# Patient Record
Sex: Male | Born: 1960 | Race: White | Hispanic: No | Smoking: Former smoker
Health system: Southern US, Community
[De-identification: ages and names within clinical notes are randomized; demographics above are authoritative.]

## PROBLEM LIST (undated history)

## (undated) DIAGNOSIS — Z8679 Personal history of other diseases of the circulatory system: Secondary | ICD-10-CM

## (undated) DIAGNOSIS — Z9889 Other specified postprocedural states: Secondary | ICD-10-CM

## (undated) DIAGNOSIS — G4730 Sleep apnea, unspecified: Secondary | ICD-10-CM

## (undated) DIAGNOSIS — M2060 Acquired deformities of toe(s), unspecified, unspecified foot: Secondary | ICD-10-CM

## (undated) DIAGNOSIS — Z952 Presence of prosthetic heart valve: Secondary | ICD-10-CM

## (undated) DIAGNOSIS — J189 Pneumonia, unspecified organism: Secondary | ICD-10-CM

## (undated) DIAGNOSIS — D649 Anemia, unspecified: Secondary | ICD-10-CM

## (undated) DIAGNOSIS — I499 Cardiac arrhythmia, unspecified: Secondary | ICD-10-CM

## (undated) DIAGNOSIS — E669 Obesity, unspecified: Secondary | ICD-10-CM

## (undated) DIAGNOSIS — I10 Essential (primary) hypertension: Secondary | ICD-10-CM

## (undated) DIAGNOSIS — K760 Fatty (change of) liver, not elsewhere classified: Secondary | ICD-10-CM

## (undated) DIAGNOSIS — Z98811 Dental restoration status: Secondary | ICD-10-CM

## (undated) DIAGNOSIS — L97509 Non-pressure chronic ulcer of other part of unspecified foot with unspecified severity: Secondary | ICD-10-CM

## (undated) DIAGNOSIS — G629 Polyneuropathy, unspecified: Secondary | ICD-10-CM

## (undated) DIAGNOSIS — E11621 Type 2 diabetes mellitus with foot ulcer: Secondary | ICD-10-CM

## (undated) DIAGNOSIS — M1990 Unspecified osteoarthritis, unspecified site: Secondary | ICD-10-CM

## (undated) DIAGNOSIS — K219 Gastro-esophageal reflux disease without esophagitis: Secondary | ICD-10-CM

## (undated) DIAGNOSIS — I4819 Other persistent atrial fibrillation: Secondary | ICD-10-CM

## (undated) HISTORY — DX: Essential (primary) hypertension: I10

## (undated) HISTORY — PX: OTHER SURGICAL HISTORY: SHX169

## (undated) HISTORY — PX: NASAL SINUS SURGERY: SHX719

## (undated) HISTORY — PX: TONSILLECTOMY AND ADENOIDECTOMY: SUR1326

## (undated) HISTORY — DX: Obesity, unspecified: E66.9

## (undated) HISTORY — PX: KNEE ARTHROSCOPY: SUR90

---

## 1999-04-21 ENCOUNTER — Encounter: Admission: RE | Admit: 1999-04-21 | Discharge: 1999-07-20 | Payer: Self-pay | Admitting: Family Medicine

## 1999-05-26 ENCOUNTER — Encounter: Payer: Self-pay | Admitting: Orthopedic Surgery

## 1999-05-26 ENCOUNTER — Encounter: Admission: RE | Admit: 1999-05-26 | Discharge: 1999-05-26 | Payer: Self-pay | Admitting: Orthopedic Surgery

## 1999-07-15 ENCOUNTER — Ambulatory Visit (HOSPITAL_COMMUNITY): Admission: RE | Admit: 1999-07-15 | Discharge: 1999-07-15 | Payer: Self-pay | Admitting: Gastroenterology

## 1999-07-15 ENCOUNTER — Encounter: Payer: Self-pay | Admitting: Gastroenterology

## 2002-08-09 ENCOUNTER — Encounter: Admission: RE | Admit: 2002-08-09 | Discharge: 2002-11-07 | Payer: Self-pay | Admitting: Family Medicine

## 2003-04-20 DIAGNOSIS — Z8679 Personal history of other diseases of the circulatory system: Secondary | ICD-10-CM

## 2003-04-20 HISTORY — DX: Personal history of other diseases of the circulatory system: Z86.79

## 2003-07-15 ENCOUNTER — Observation Stay (HOSPITAL_COMMUNITY): Admission: RE | Admit: 2003-07-15 | Discharge: 2003-07-15 | Payer: Self-pay | Admitting: Orthopedic Surgery

## 2003-11-18 DIAGNOSIS — Z952 Presence of prosthetic heart valve: Secondary | ICD-10-CM

## 2003-11-18 DIAGNOSIS — Z9889 Other specified postprocedural states: Secondary | ICD-10-CM

## 2003-11-18 HISTORY — DX: Other specified postprocedural states: Z98.890

## 2003-11-18 HISTORY — PX: AORTIC VALVE REPLACEMENT: SHX41

## 2003-11-18 HISTORY — DX: Presence of prosthetic heart valve: Z95.2

## 2004-01-13 ENCOUNTER — Encounter (HOSPITAL_COMMUNITY): Admission: RE | Admit: 2004-01-13 | Discharge: 2004-04-12 | Payer: Self-pay | Admitting: Cardiology

## 2004-03-19 HISTORY — PX: GASTRIC BYPASS: SHX52

## 2004-07-08 ENCOUNTER — Inpatient Hospital Stay (HOSPITAL_COMMUNITY): Admission: EM | Admit: 2004-07-08 | Discharge: 2004-07-09 | Payer: Self-pay | Admitting: Emergency Medicine

## 2004-07-08 ENCOUNTER — Ambulatory Visit: Payer: Self-pay | Admitting: Internal Medicine

## 2004-07-08 DIAGNOSIS — K250 Acute gastric ulcer with hemorrhage: Secondary | ICD-10-CM | POA: Insufficient documentation

## 2004-08-17 ENCOUNTER — Ambulatory Visit: Payer: Self-pay | Admitting: Internal Medicine

## 2004-08-27 ENCOUNTER — Ambulatory Visit: Payer: Self-pay | Admitting: Internal Medicine

## 2005-07-12 ENCOUNTER — Ambulatory Visit (HOSPITAL_COMMUNITY): Admission: RE | Admit: 2005-07-12 | Discharge: 2005-07-12 | Payer: Self-pay | Admitting: Orthopedic Surgery

## 2005-07-12 ENCOUNTER — Encounter (INDEPENDENT_AMBULATORY_CARE_PROVIDER_SITE_OTHER): Payer: Self-pay | Admitting: Specialist

## 2005-07-12 HISTORY — PX: TOE FUSION: SHX1070

## 2005-07-23 ENCOUNTER — Emergency Department (HOSPITAL_COMMUNITY): Admission: EM | Admit: 2005-07-23 | Discharge: 2005-07-23 | Payer: Self-pay | Admitting: Emergency Medicine

## 2005-09-14 ENCOUNTER — Ambulatory Visit: Payer: Self-pay | Admitting: Internal Medicine

## 2006-07-11 ENCOUNTER — Ambulatory Visit: Payer: Self-pay | Admitting: Licensed Clinical Social Worker

## 2006-10-11 ENCOUNTER — Ambulatory Visit: Payer: Self-pay | Admitting: Internal Medicine

## 2007-06-21 ENCOUNTER — Encounter: Admission: RE | Admit: 2007-06-21 | Discharge: 2007-06-21 | Payer: Self-pay | Admitting: Orthopedic Surgery

## 2007-08-09 ENCOUNTER — Ambulatory Visit (HOSPITAL_COMMUNITY): Admission: RE | Admit: 2007-08-09 | Discharge: 2007-08-09 | Payer: Self-pay | Admitting: Orthopedic Surgery

## 2007-08-17 DIAGNOSIS — K2211 Ulcer of esophagus with bleeding: Secondary | ICD-10-CM | POA: Insufficient documentation

## 2007-08-17 DIAGNOSIS — E119 Type 2 diabetes mellitus without complications: Secondary | ICD-10-CM | POA: Insufficient documentation

## 2007-08-17 DIAGNOSIS — K7689 Other specified diseases of liver: Secondary | ICD-10-CM | POA: Insufficient documentation

## 2007-08-17 DIAGNOSIS — K8020 Calculus of gallbladder without cholecystitis without obstruction: Secondary | ICD-10-CM | POA: Insufficient documentation

## 2007-12-26 ENCOUNTER — Encounter: Payer: Self-pay | Admitting: Internal Medicine

## 2008-04-04 ENCOUNTER — Encounter: Payer: Self-pay | Admitting: Internal Medicine

## 2008-07-09 ENCOUNTER — Encounter: Payer: Self-pay | Admitting: Internal Medicine

## 2008-10-02 ENCOUNTER — Telehealth: Payer: Self-pay | Admitting: Internal Medicine

## 2008-12-17 ENCOUNTER — Ambulatory Visit: Payer: Self-pay | Admitting: Licensed Clinical Social Worker

## 2008-12-27 ENCOUNTER — Encounter: Payer: Self-pay | Admitting: Internal Medicine

## 2009-04-07 ENCOUNTER — Encounter: Payer: Self-pay | Admitting: Internal Medicine

## 2009-05-29 ENCOUNTER — Encounter: Admission: RE | Admit: 2009-05-29 | Discharge: 2009-05-29 | Payer: Self-pay | Admitting: Podiatry

## 2009-06-03 ENCOUNTER — Encounter: Admission: RE | Admit: 2009-06-03 | Discharge: 2009-06-03 | Payer: Self-pay | Admitting: Podiatry

## 2009-09-19 ENCOUNTER — Encounter (INDEPENDENT_AMBULATORY_CARE_PROVIDER_SITE_OTHER): Payer: Self-pay | Admitting: *Deleted

## 2009-09-29 ENCOUNTER — Encounter (INDEPENDENT_AMBULATORY_CARE_PROVIDER_SITE_OTHER): Payer: Self-pay | Admitting: *Deleted

## 2009-11-04 ENCOUNTER — Encounter (INDEPENDENT_AMBULATORY_CARE_PROVIDER_SITE_OTHER): Payer: Self-pay | Admitting: *Deleted

## 2009-11-06 ENCOUNTER — Ambulatory Visit: Payer: Self-pay | Admitting: Internal Medicine

## 2009-11-07 ENCOUNTER — Encounter: Payer: Self-pay | Admitting: Internal Medicine

## 2009-11-11 ENCOUNTER — Telehealth: Payer: Self-pay | Admitting: Internal Medicine

## 2009-12-15 ENCOUNTER — Telehealth: Payer: Self-pay | Admitting: Internal Medicine

## 2010-01-16 ENCOUNTER — Encounter: Admission: RE | Admit: 2010-01-16 | Discharge: 2010-01-16 | Payer: Self-pay | Admitting: Orthopedic Surgery

## 2010-02-10 ENCOUNTER — Ambulatory Visit (HOSPITAL_COMMUNITY): Admission: RE | Admit: 2010-02-10 | Discharge: 2010-02-10 | Payer: Self-pay | Admitting: Internal Medicine

## 2010-02-10 ENCOUNTER — Ambulatory Visit: Payer: Self-pay | Admitting: Internal Medicine

## 2010-02-20 ENCOUNTER — Ambulatory Visit: Payer: Self-pay | Admitting: Cardiology

## 2010-03-20 ENCOUNTER — Ambulatory Visit (HOSPITAL_COMMUNITY)
Admission: RE | Admit: 2010-03-20 | Discharge: 2010-03-20 | Payer: Self-pay | Source: Home / Self Care | Admitting: Orthopedic Surgery

## 2010-03-20 HISTORY — PX: METATARSAL OSTEOTOMY: SHX1641

## 2010-05-10 ENCOUNTER — Encounter: Payer: Self-pay | Admitting: Orthopedic Surgery

## 2010-05-21 NOTE — Miscellaneous (Signed)
Summary: question for md  ---- Converted from flag ---- ---- 11/07/2009 8:54 AM, Hilarie Fredrickson MD wrote: Per most recent guidelines, not neccessary. Thanks for checking  ---- 11/06/2009 3:07 PM, Karl Bales RN wrote: Dr. Marina Goodell, This pt is having a colonoscopy at [facility] on 11-14-09.  He has an aortic valve replacement and take Clindamycin prior to procedures.  Do you want him to have this antibiotic before colonoscopy?  Thank you. Karl Bales ------------------------------

## 2010-05-21 NOTE — Miscellaneous (Signed)
Summary: [facility] previsit  Clinical Lists Changes  Medications: Added new medication of MOVIPREP 100 GM  SOLR (PEG-KCL-NACL-NASULF-NA ASC-C) As per prep instructions. - Signed Rx of MOVIPREP 100 GM  SOLR (PEG-KCL-NACL-NASULF-NA ASC-C) As per prep instructions.;  #1 x 0;  Signed;  Entered by: Karl Bales RN;  Authorized by: Hilarie Fredrickson MD;  Method used: [facility] [phone]*, [address], [place], [place], [place]  [address], Ph: [phone], Fax: [phone] Allergies: Added new allergy or adverse reaction of SULFA Added new allergy or adverse reaction of NSAIDS Observations: Added new observation of NKA: F (11/06/2009 14:16)    Prescriptions: MOVIPREP 100 GM  SOLR (PEG-KCL-NACL-NASULF-NA ASC-C) As per prep instructions.  #1 x 0   Entered by:   Karl Bales RN   Authorized by:   Hilarie Fredrickson MD   Signed by:   Karl Bales RN on 11/06/2009   Method used:   Electronically to        [facility] [phone]* (retail)       [address]       [place], [place]  [address]       Ph: [phone]       Fax: [phone]   RxID:   [id number]

## 2010-05-21 NOTE — Progress Notes (Signed)
Summary: resch proc   Phone Note Call from Patient Call back at Work Phone [phone]   Caller: Patient Call For: Dr Marina Goodell Reason for Call: Talk to Nurse Summary of Call: Patient wants to reschedule colon that has to be done in the hosp. Initial call taken by: Tawni Levy,  December 15, 2009 8:58 AM  Follow-up for Phone Call        appt rescheduled for 02/10/10 915 am at [facility] pt aware.   Follow-up by: Chales Abrahams CMA Duncan Dull),  December 15, 2009 9:54 AM

## 2010-05-21 NOTE — Miscellaneous (Signed)
Summary: Orders Update-Colonoscopy [facility]  Clinical Lists Changes  Orders: Added new Test order of ZCOL (ZCOL) - Signed

## 2010-05-21 NOTE — Letter (Signed)
Summary: Colonoscopy Letter   Gastroenterology  [address] [place], [place] [address]   Phone: [phone]  Fax: [phone]      September 19, 2009 MRN: [id number]   [facility] [address] [place], [place]  [address]   Dear Mr. Philip [name],   According to your medical record, it is time for you to schedule a Colonoscopy. The American Cancer Society recommends this procedure as a method to detect early colon cancer. Patients with a family history of colon cancer, or a personal history of colon polyps or inflammatory bowel disease are at increased risk.  This letter has been generated based on the recommendations made at the time of your procedure. If you feel that in your particular situation this may no longer apply, please contact our office.  Please call our office at [phone] to schedule this appointment or to update your records at your earliest convenience.  Thank you for cooperating with [place] to provide you with the very best care possible.   Sincerely,  Wilhemina Bonito. Marina Goodell, M.D.  [facility] Gastroenterology Division [phone]

## 2010-05-21 NOTE — Letter (Signed)
Summary: [facility] Instructions  [facility]  [address] [place], [place] [address]   Phone: [phone]  Fax: [phone]       Philip [name]    [birth year]    MRN: [id number]        Procedure Day Dorna Bloom: Friday, 50-29-11     Arrival Time: 50:00 a.m.     Procedure Time: 9:00 a.m.     Location of Procedure:                    x [facility] (outpatient registration)  PREPARATION FOR COLONOSCOPY WITH MOVIPREP   Starting 5 days prior to your procedure 11-09-09 do not eat nuts, seeds, popcorn, corn, beans, peas,  salads, or any raw vegetables.  Do not take any fiber supplements (e.g. Metamucil, Citrucel, and Benefiber).  THE DAY BEFORE YOUR PROCEDURE         DATE:  11-13-09 DAY: Thursday  50.  Drink clear liquids the entire day-NO SOLID FOOD  2.  Do not drink anything colored red or purple.  Avoid juices with pulp.  No orange juice.  3.  Drink at least 64 oz. (8 glasses) of fluid/clear liquids during the day to prevent dehydration and help the prep work efficiently.  CLEAR LIQUIDS INCLUDE: Water Jello Ice Popsicles Tea (sugar ok, no milk/cream) Powdered fruit flavored drinks Coffee (sugar ok, no milk/cream) Gatorade Juice: apple, white grape, white cranberry  Lemonade Clear bullion, consomm, broth Carbonated beverages (any kind) Strained chicken noodle soup Hard Candy                             4.  In the morning, mix first dose of MoviPrep solution:    Empty 1 Pouch A and 1 Pouch B into the disposable container    Add lukewarm drinking water to the top line of the container. Mix to dissolve    Refrigerate (mixed solution should be used within 24 hrs)  5.  Begin drinking the prep at 5:00 p.m. The MoviPrep container is divided by 4 marks.   Every 15 minutes drink the solution down to the next mark (approximately 8 oz) until the full liter is complete.   6.  Follow completed prep with 16 oz of clear liquid of your choice (Nothing red or purple).   Continue to drink clear liquids until bedtime.  7.  Before going to bed, mix second dose of MoviPrep solution:    Empty 1 Pouch A and 1 Pouch B into the disposable container    Add lukewarm drinking water to the top line of the container. Mix to dissolve    Refrigerate  THE DAY OF YOUR PROCEDURE      DATE:  11-14-09 DAY: FRIDAY  Beginning at  4:00 a.m. (5 hours before procedure):         1. Every 15 minutes, drink the solution down to the next mark (approx 8 oz) until the full liter is complete.  2. Follow completed prep with 16 oz. of clear liquid of your choice.    3. You may drink clear liquids until 5:00 a.m. (4 hours prior to procedure)   MEDICATION INSTRUCTIONS  Unless otherwise instructed, you should take regular prescription medications with a small sip of water   as early as possible the morning of your procedure.  Diabetic patients - see separate instructions.  HOLD FUROSEMIDE before coming the day of your procedure only (only  hold 11-14-09 before coming in)         OTHER INSTRUCTIONS  You will need a responsible adult at least 50 years of age to accompany you and drive you home.   This person must remain in the waiting room during your procedure.  Wear loose fitting clothing that is easily removed.  Leave jewelry and other valuables at home.  However, you may wish to bring a book to read or  an iPod/MP3 player to listen to music as you wait for your procedure to start.  Remove all body piercing jewelry and leave at home.  Total time from sign-in until discharge is approximately 2-3 hours.  You should go home directly after your procedure and rest.  You can resume normal activities the  day after your procedure.  The day of your procedure you should not:   Drive   Make legal decisions   Operate machinery   Drink alcohol   Return to work  You will receive specific instructions about eating, activities and medications before you leave.    The  above instructions have been reviewed and explained to me by  Karl Bales RN  November 06, 2009 2:58 PM    I fully understand and can verbalize these instructions _____________________________ Date _________

## 2010-05-21 NOTE — Procedures (Signed)
Summary: Colonoscopy  Patient: Philip [name] Note: All result statuses are Final unless otherwise noted.  Tests: (1) Colonoscopy (COL)   COL Colonoscopy           DONE     [facility]     [address] [place], [place]  [address]           COLONOSCOPY PROCEDURE REPORT           PATIENT:  Philip [name], Philip [name]  MR#:  [id number]     BIRTHDATE:  1960/06/24, 48 yrs. old  GENDER:  male     ENDOSCOPIST:  Wilhemina Bonito. Eda Keys, MD     REF. BY:  Screening Recall     PROCEDURE DATE:  02/10/2010     PROCEDURE:  Higher-risk screening colonoscopy G0105           ASA CLASS:  Class II     INDICATIONS:  family history of colon cancer, surveillance and     high-risk screening ; grandparent w/ CRC; negative exam 2006     MEDICATIONS:   Fentanyl 100 mcg IV, Versed 10 mg IV           DESCRIPTION OF PROCEDURE:   After the risks benefits and     alternatives of the procedure were thoroughly explained, informed     consent was obtained.  Digital rectal exam was performed and     revealed no abnormalities.   The Pentax Ped Colon [id number]     endoscope was introduced through the anus and advanced to the     cecum, which was identified by both the appendix and ileocecal     valve, without limitations.Time to cecum = 3:00 min.  The quality     of the prep was good, using MoviPrep.  The instrument was then     slowly withdrawn (time = 10:30 min) as the colon was fully     examined.     <<PROCEDUREIMAGES>>           FINDINGS:  A normal appearing cecum, ileocecal valve, and     appendiceal orifice were identified. The ascending, hepatic     flexure, transverse, splenic flexure, descending, sigmoid colon,     and rectum appeared unremarkable.  No polyps or cancers were seen.     Retroflexed views in the rectum revealed internal hemorrhoids.     The scope was then withdrawn from the patient and the procedure     completed.           COMPLICATIONS:  None     ENDOSCOPIC IMPRESSION:     1) Normal colon     2)  No polyps or cancers     3) Internal hemorrhoids     RECOMMENDATIONS:     1) Continue current colorectal screening recommendations for     "routine risk" patients with a repeat colonoscopy in 10 years.           ______________________________     Wilhemina Bonito. Eda Keys, MD           CC:  The Patient           n.     eSIGNED:   Wilhemina Bonito. Eda Keys at 02/10/2010 10:21 AM           Renie Ora, [phone]  Note: An exclamation mark (!) indicates a result that was not dispersed into the flowsheet. Document Creation Date: 02/10/2010 10:22 AM _______________________________________________________________________  (1)  Order result status: Final Collection or observation date-time: 02/10/2010 10:15 Requested date-time:  Receipt date-time:  Reported date-time:  Referring Physician:   Ordering Physician: Fransico Setters [phone]) Specimen Source:  Source: Launa Grill Order Number: [id number] Lab site:

## 2010-05-21 NOTE — Letter (Signed)
Summary: Diabetic Instructions  [facility]  [address] [place], [place] [address]   Phone: [phone]  Fax: [phone]    Philip [name] [birth year] MRN: [id number]   _x _   ORAL DIABETIC MEDICATION INSTRUCTIONS  The day before your procedure:   Take your diabetic pill as you do normally  The day of your procedure:   Do not take your diabetic pill    We will check your blood sugar levels during the admission process and again in Recovery before discharging you home  ________________________________________________________________________

## 2010-05-21 NOTE — Progress Notes (Signed)
Summary: Reschedule COL   Phone Note Call from Patient Call back at Work Phone [phone] Call back at [phone]   Caller: Patient Call For: Dr. Marina Goodell Reason for Call: Talk to Nurse Summary of Call: Needs to r/s his COL on 11-14-09 Initial call taken by: Karna Christmas,  November 11, 2009 9:21 AM  Follow-up for Phone Call         Annice Pih ntfd to  cx procedure. for Friday at [facility].Pt will call back to r/s. Follow-up by: Teryl Lucy RN,  November 11, 2009 10:03 AM  Additional Follow-up for Phone Call Additional follow up Details #1::        ok thanks. When he does rescedule, it needs to be at hospital (due to size). Additional Follow-up by: Hilarie Fredrickson MD,  November 11, 2009 10:13 AM

## 2010-05-21 NOTE — Letter (Signed)
Summary: Previsit letter  [facility]  [address] [place], [place] [address]   Phone: [phone]  Fax: [phone]       09/29/2009 MRN: [id number]  [facility] [address] [place], [place]  [address]  Dear Mr. Philip [name],  Welcome to the Gastroenterology Division at [facility].    You are scheduled to see a nurse for your pre-procedure visit on 11-06-09 at 2:30p.m. on the 3rd floor at [facility], [address]  We ask that you try to arrive at our office 15 minutes prior to your appointment time to allow for check-in.  Your nurse visit will consist of discussing your medical and surgical history, your immediate family medical history, and your medications.    Please bring a complete list of all your medications or, if you prefer, bring the medication bottles and we will list them.  We will need to be aware of both prescribed and over the counter drugs.  We will need to know exact dosage information as well.  If you are on blood thinners (Coumadin, Plavix, Aggrenox, Ticlid, etc.) please call our office today/prior to your appointment, as we need to consult with your physician about holding your medication.   Please be prepared to read and sign documents such as consent forms, a financial agreement, and acknowledgement forms.  If necessary, and with your consent, a friend or relative is welcome to sit-in on the nurse visit with you.  Please bring your insurance card so that we may make a copy of it.  If your insurance requires a referral to see a specialist, please bring your referral form from your primary care physician.  No co-pay is required for this nurse visit.     If you cannot keep your appointment, please call [phone] to cancel or reschedule prior to your appointment date.  This allows [place] the opportunity to schedule an appointment for another patient in need of care.    Thank you for choosing [facility] for your medical needs.  We  appreciate the opportunity to care for you.  Please visit [place] at our website  to learn more about our practice.                     Sincerely.                                                                                                                   The Gastroenterology Division

## 2010-06-29 LAB — BASIC METABOLIC PANEL
BUN: 17 mg/dL (ref 6–23)
CO2: 30 mEq/L (ref 19–32)
Potassium: 4.3 mEq/L (ref 3.5–5.1)
Sodium: 138 mEq/L (ref 135–145)

## 2010-06-29 LAB — GLUCOSE, CAPILLARY
Glucose-Capillary: 128 mg/dL — ABNORMAL HIGH (ref 70–99)
Glucose-Capillary: 134 mg/dL — ABNORMAL HIGH (ref 70–99)

## 2010-06-29 LAB — CBC
Hemoglobin: 14 g/dL (ref 13.0–17.0)
MCHC: 33.2 g/dL (ref 30.0–36.0)
RBC: 4.64 MIL/uL (ref 4.22–5.81)
RDW: 14.2 % (ref 11.5–15.5)

## 2010-07-01 LAB — GLUCOSE, CAPILLARY: Glucose-Capillary: 124 mg/dL — ABNORMAL HIGH (ref 70–99)

## 2010-09-01 NOTE — Assessment & Plan Note (Signed)
[facility]                         GASTROENTEROLOGY OFFICE NOTE   ANANIAS, KOLANDER                          MRN:          [id number]  DATE:10/11/2006                            DOB:          Jan 17, 1961    HISTORY:  Mr. Philip [name] presents today for followup and requests medication  refill.  He is a 50 year old gentleman with morbid obesity for which he  is status post gastric bypass surgery, history of aortic valve  replacement (porcine valve), history of right sided heart failure,  diabetes mellitus, upper GI bleed secondary to postoperative anastomotic  ulcer requiring endoscopic hemostatic therapy, family history of colon  cancer, incidental cholelithiasis, and nonalcoholic steatohepatitis.  He  was last evaluated Sep 14, 2005 regarding elevated liver tests and an  abnormal ultrasound.  See that dictation for details.  Since that time  he has been doing well.  No evidence of recurrent GI bleeding.  He takes  Nexium 40 mg daily chronically as requested.  He takes 1 baby aspirin  for his heart.  He has had no problems with abdominal pain to suggest  symptomatic gallstones.   ALLERGIES:  PENICILLIN and NON-STEROIDAL ANTI-INFLAMMATORY DRUGS.   CURRENT MEDICATIONS:  1. Actos 45 mg daily.  2. Metformin 1000 mg b.i.d.  3. Zyrtec once daily.  4. Aspirin 81 mg daily.  5. Vitamin C.  6. Multivitamin.  7. Potassium 8 mEq daily.  8. Altace 2.5 mg b.i.d.  9. Nexium 40 mg daily.  10.Lasix 40 mg b.i.d.  11.Calcium.  12.Lexapro 20 mg daily.   PHYSICAL EXAMINATION:  A well-appearing male in no acute distress.  Blood pressure is 118/86, heart rate is 68 and regular.  Weight is 370  pounds.  HEENT:  Sclerae are anicteric. Conjunctiva are pink.  Oral mucosa is  intact.  No adenopathy.  LUNGS:  Clear.  HEART:  Regular.  ABDOMEN:  Obese and soft without tenderness, mass or hernia.  No  organomegaly, good bowel sounds heard.  Priorsurgical incision is well  healed.   IMPRESSION:  1. History of bleeding gastric ulcer at the postop anastomosis      requiring endoscopic hemostatic therapy.  Currently on chronic      Nexium 40 mg daily to prevent recurrent bleeding ulcer.  This is      also quite important given his need for chronic aspirin therapy.  2. Non-alcoholic fatty liver disease .  Stable.  3. Cholelithiasis.  Incidental.  4. Family history of colon cancer.  Negative screening colonoscopy May      2006.  Followup in 2011 as planned.   RECOMMENDATIONS:  1. Refill Nexium as requested.  2. Resume general medical care with Dr. Riley Nearing  3. Routine gastrointestinal followup in 1 year unless interval      questions or problems.     Wilhemina Bonito. Marina Goodell, MD  Electronically Signed    JNP/MedQ  DD: 10/11/2006  DT: 10/11/2006  Job #: [id number]   cc:   Reginia Forts, MD

## 2010-09-01 NOTE — Op Note (Signed)
NAME:  Philip [name], Philip [name]                   ACCOUNT NO.:  [id number]   MEDICAL RECORD NO.:  [id number]          PATIENT TYPE:  AMB   LOCATION:  SDS                          FACILITY:  [facility]   PHYSICIAN:  John L. Rendall, M.D.  DATE OF BIRTH:  [birth year]   DATE OF PROCEDURE:  08/09/2007  DATE OF DISCHARGE:                               OPERATIVE REPORT   INDICATION AND JUSTIFICATION FOR PROCEDURE:  Chronic medial left knee  pain with MRI positive for osteoarthritis plus posterior horn tear of  medial meniscus at the root, justification for outpatient setting,  inpatient not required.   PREOPERATIVE DIAGNOSIS:  Posterior horn tear of medial meniscus.   SURGEON:  John L. Rendall, M.D.   ANESTHESIA:  MAC.   PROCEDURE:  Under MAC anesthesia, the knee was prepared with DuraPrep  and draped as a sterile field with a leg holder.  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  Local chondroplasty was done on peeling hyaline cartilage.  In the posterior horn, there is a tear at the root of the meniscus.  This was resected back to stable meniscal rim with a combination of  basket forceps and intraarticular shaver.  The lateral meniscus showed  some minor abnormal signal on MRI, but it was intact showing no signs of  tearing and there is no significant arthritis in this compartment.  The  center of the femoral trochlea showed no significant arthritis as did  the undersurface of the patella.  The knee was copiously irrigated with  saline, infiltrated with Marcaine with epinephrine, and a sterile  compression bandage was applied.  Two of the three punctures were  sutured.  The patient was returned to recovery in good condition.      John L. Rendall, M.D.  Electronically Signed     JLR/MEDQ  D:  08/09/2007  T:  08/09/2007  Job:  [id number]

## 2010-09-04 NOTE — Op Note (Signed)
NAME:  MCCABE, GLORIA                   ACCOUNT NO.:  [id number]   MEDICAL RECORD NO.:  [id number]          PATIENT TYPE:  AMB   LOCATION:  DAY                          FACILITY:  [facility]   PHYSICIAN:  John L. Rendall, M.D.  DATE OF BIRTH:  [birth year]   DATE OF PROCEDURE:  07/12/2005  DATE OF DISCHARGE:                                 OPERATIVE REPORT   PREOPERATIVE DIAGNOSIS:  Fixed claw toe, left third.   SURGICAL PROCEDURE:  PIP and the DIP fusion, left third toe.   POSTOPERATIVE DIAGNOSIS:  Fixed claw toe, left third.   SURGEON:  John L. Rendall, M.D.   ANESTHESIA:  Ankle and metatarsal block with sedation.   PROCEDURE:  Under the above anesthesia the left foot is prepared with  DuraPrep and draped as a sterile field.  It is wrapped out with an Esmarch  and ankle Esmarch is used as a tourniquet.  A dorsal midline incision is  made down the center of the toe.  The extensor mechanism is elevated from  the 2 joints.  The bone ends are exposed and removed with a rongeur, leaving  a flat square surface on all 4 bone ends.  Once the 4 bone ends are exposed  and cut, the Accutrack screw set is used; standard bone screw and a  guidewire is placed from the distal phalanx beneath the nail, up the distal  phalanx, coming out in the center of the DIP joint crossing the middle  phalanx and into the center of the proximal phalanx, crossing the MP joint  the length of this is measured with the bone ends impacted; and is  approximately 35-40 mm for the entire intratoe screw length.   Consequently, a standard 30 mm Accutrack 2 bone screw is chosen.  The distal  cortex of the distal phalanx is perforated with a drill and then the self-  threading screw is placed up the guidewire after checking first with the C-  arm to make sure of good position and alignment.  The screw is then passed  and it self compressed the 2 joints leaving complete bone apposition of the  DIP joint; and only a tiny gap in a  portion of the PIP joint.  This is then  filled in with cancellus bone chips from the resected bone ends.  Once this  is completed, the extensor mechanism is closed over the osteotomies with  side-to-side sutures of 2-0 Vicryl.  The tourniquet is let down at 30  minutes at the ankle, slight hyperextension of the MP joint is corrected by  a Z-lengthening of the extensor at the MP joint.  This is then repaired with  the 2-0 Vicryl stitch and the problem of hyperextension is no longer  encountered.  With the tourniquet down, there are several veins that are  cauterized with the fascial envelope closed, skin sutures of 3-0 nylon are  used approximately four of them, about a centimeter apart; and once they are  in a sterile compression bandage is applied with a wooden shoe in recovery  room.  The  patient is given Percocet and Cipro and is to recheck in the  office in approximately 3 days.  He is here released home, once he has  awakened.      John L. Rendall, M.D.  Electronically Signed    JLR/MEDQ  D:  07/12/2005  T:  07/14/2005  Job:  [id number]

## 2010-09-04 NOTE — Discharge Summary (Signed)
Philip [name], ROUT NO.:  [id number]   MEDICAL RECORD NO.:  [id number]          PATIENT TYPE:  INP   LOCATION:  4709                         FACILITY:  [facility]   PHYSICIAN:  Danae Chen, M.D.DATE OF BIRTH:  06/25/1960   DATE OF ADMISSION:  07/08/2004  DATE OF DISCHARGE:  07/09/2004                                 DISCHARGE SUMMARY   PRIMARY PHYSICIAN:  Dr. Riley Nearing in [facility].   GI CONSULTANT:  John N. Marina Goodell, M.D. [facility].   DISCHARGE DIAGNOSES:  1.  Upper gastrointestinal bleed.  2.  Gastric ulcer.  3.  Status post gastric bypass surgery.  4.  Diabetes.  5.  Status post aortic valve replacement, porcine valve.   DISCHARGE MEDICATIONS:  1.  The patient is to resume his home medications at prior doses, except for      his aspirin.  2.  He is to take Actos 30 mg p.o. daily.  3.  Metformin extended release 1,00 mg p.o. b.i.d.  4.  Lasix 80 mg p.o. q.a.m. 40 mg p.o. at bedtime.  5.  Actigall 300 mg p.o. b.i.d.  6.  Potassium supplements 80 mEq p.o. daily.  7.  Vitamin C 1,000 mg daily.  8.  Calcium 500 mg p.o. b.i.d.  9.  Centrum 1 multivitamin daily.  10. New medication, Protonix 40 mg p.o. b.i.d. for two weeks, then to resume      once daily Nexium 40 mg p.o. daily.   FOLLOWUP:  Appointment has been made with Dr. Marina Goodell for Aug 17, 2004 at 11:00  a.m. on a Monday.   DISPOSITION:  As noted, the patient is to not resume his aspirin until he is  seen by Dr. Marina Goodell.   HOSPITAL COURSE:  The patient is a pleasant 50 year old gentleman who was  admitted for evaluation of black, tarry stools.  He underwent gastric bypass  surgery in December 2005 as well as an aortic valve replacement in August  2005.  The patient had not been on any nonsteroidals, no alcohol, but had  been on a baby aspirin since his surgery.  The patient was found to be  anemic on admission, with hemoglobin 11.8, so GI consult was obtained.  The  patient was prepped for an EGD,  which he underwent on July 08, 2004.  Please see the full report for details, but essentially the patient had a  stomach ulcer at the site of anastomosis from his bypass surgery which was  oozing but did not have any active bleeding.  It was injected with  epinephrine, and hemostasis was achieved.  The patient tolerated the  procedure well.  At the time of discharge, discharge hemoglobin was 10.4.  The patient was afebrile.  Vital signs were stable.  Blood pressure was 109/71.  Abdomen was soft.  He  was tolerating his liquid diet, and no nausea, no abdominal pain, no  vomiting.  The patient is to follow up with his primary care physician, Dr.  Riley Nearing, in one to two weeks, and with Dr. Marina Goodell, as noted above.  RLK/MEDQ  D:  07/09/2004  T:  07/10/2004  Job:  [id number]

## 2010-09-04 NOTE — Op Note (Signed)
NAMERJ, PEDROSA                             ACCOUNT NO.:  [id number]   MEDICAL RECORD NO.:  [id number]                   PATIENT TYPE:  INP   LOCATION:  2899                                 FACILITY:  [facility]   PHYSICIAN:  John L. Rendall III, M.D.           DATE OF BIRTH:  [birth year]   DATE OF PROCEDURE:  07/15/2003  DATE OF DISCHARGE:                                 OPERATIVE REPORT   PREOPERATIVE DIAGNOSIS:  Torn right medial meniscus with osteoarthritis.   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   OPERATION PERFORMED:  Medial meniscectomy with local chondroplasty plus  patellofemoral chondroplasty.   SURGEON:  John L. Rendall, M.D.   ANESTHESIA:  Local with sedation.   DESCRIPTION OF PROCEDURE:  Under local anesthesia with sedation, the right  knee was prepared with Betadine and draped as a sterile field with a leg  holder.  Standard arthroscopic portals were made and some peeling hyaline  articular cartilage was found in the center of the weightbearing medial  femoral condyle and a complex posterior horn tear of the medial meniscus was  found.  Using a Cuda shaver, a local chondroplasty was done on the condyle  and using a combination of basket forceps, the posterior horn of the medial  meniscus was resected back to stable meniscal rim.  The remainder of the  knee was then carefully examined, the lateral compartment was intact.  The  center of the femoral trochlea beneath the patella showed a stellate pattern  of peeling cartilage and the inferior pole of the patella that impacts this  area is also breaking down significantly.  Using a shaver through both  medial and lateral portals, these areas were shaved, removing all peeling  hyaline cartilage.  Once this completed, the knee was copiously irrigated  with saline.  Punctures were left open.  The knee was infiltrated with  Marcaine with morphine with epinephrine and a sterile  compressive bandage  was applied.  A determination as to whether the patient goes home or not  depends on how he awakens due to his sleep apnea problem.  We will give him  Vicodin for pain.                                               John L. Dorothyann Gibbs, M.D.    Renato Gails  D:  07/15/2003  T:  07/15/2003  Job:  [id number]

## 2010-09-04 NOTE — H&P (Signed)
NAMECARLYN, MULLENBACH NO.:  [id number]   MEDICAL RECORD NO.:  [id number]          PATIENT TYPE:  INP   LOCATION:  1832                         FACILITY:  [facility]   PHYSICIAN:  Elliot Cousin, M.D.    DATE OF BIRTH:  09-29-1960   DATE OF ADMISSION:  07/08/2004  DATE OF DISCHARGE:                                HISTORY & PHYSICAL   PRIMARY CARE PHYSICIAN:  Dr. Riley Nearing in [place], [place].   GASTROENTEROLOGIST:  Ulyess Mort, M.D. [facility].   CHIEF COMPLAINT:  Nausea and black stools.   HISTORY OF PRESENT ILLNESS:  Mr. Mcconaghy is a 50 year old man with a past  medical history significant for gastric bypass surgery in December 2005,  status post aortic valve replacement in August 2005, diabetes mellitus, and  hypertension, who presents to the emergency department with several episodes  of black watery stools today.  The patient had his first black watery stool  at approximately 3:30 p.m. on July 07, 2004.  He states that he may have  seen a little bright red blood in the stool.  The patient denies abdominal  pain, but he has had some nausea.  He denies vomiting or hematemesis.  He  has had some associated light-headedness for the past two days, and he has  also complained of fatigue for the past few days.  The patient has had no  spinning dizziness and no blackout spells.  He denied any associated chest  pain or shortness of breath.  He does not drink alcohol.  He does take a  baby aspirin 81 mg daily.  The patient denies any other NSAIDs.  The patient  has no prior history of anemia.  He did have a colonoscopy performed by Dr.  Corinda Gubler in 2002 which was completely within normal limits.  The patient does  have a family history of colon cancer.   When the patient was evaluated in the emergency department, his blood  pressure was 106/62.  His hemoglobin was 11.8, with an MCV of 83.3.  Dr. Linwood Dibbles inserted an NG tube to evaluate for an upper GI source of  bleeding.  The NG actually suctioned out bloody contents from the patient's stomach.  The patient will therefore be admitted for further evaluation and  management.   PAST MEDICAL HISTORY:  1.  Status post laparoscopic gastric bypass surgery.  The patient has lost      100 pounds in 90 days.  Surgery performed by Dr. Lily Peer at [facility], [facility], December 2005.  2.  Status post aortic valve replacement at [facility], [facility], August 2005.  Operation performed by Dr. Park Breed.  3.  Perioperative right-sided heart failure.  4.  Endocarditis in 1994.  5.  Type 2 diabetes mellitus.  6.  Hypertension.  7.  Obstructive sleep apnea.  8.  Obesity.  9.  Status post tonsillectomy and adenoidectomy in the past, along with      status post nasal  polyp excision in the past.  10. Status post arthroscopic surgery on both knees in the past.  The latest      surgery performed by Dr. Priscille Kluver was a medial meniscectomy with local      chondroplasty plus patellofemoral chondroplasty.  11. Status post arthroscopic surgery on both feet, with pins in both second      toes.  12. Depression.  13. Seasonal allergies.   MEDICATIONS:  1.  CPAP at bedtime.  2.  Actos 30 mg daily.  3.  Metformin ER 1,000 mg b.i.d.  4.  Lasix 80 mg in the morning and 40 mg in the evening.  5.  Actigall 300 mg b.i.d.  6.  Aspirin 81 mg daily.  7.  Potassium chloride 8 mEq daily.  8.  Vitamin C 1,000 mg daily.  9.  Calcium 500 mg b.i.d.  10. Centrum 1 tablet daily.   ALLERGIES:  1.  The patient has an allergy to PENICILLIN, which causes his lips to      swell.  2.  The patient does have an intolerance to NSAIDs, although he can tolerate      baby aspirin.   SOCIAL HISTORY:  The patient is married and lives [place]  He has four children.  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  He  denies tobacco, alcohol, and  drug use.  He can read and write.   FAMILY HISTORY:  Positive for colon cancer.  His mother is 62 years of age  and has arthritis and Alzheimer's.  His father is 61 years of age and has no  significant past medical history.   REVIEW OF SYSTEMS:  The patient's review of systems is positive for  occasional swelling in his legs, occasional bilateral knee pain, occasional  shortness of breath.  Otherwise, his review of systems is negative.   EXAMINATION:  TEMPERATURE:  97.6.  BLOOD PRESSURE:  106/62.  PULSE:  88.  RESPIRATORY RATE:  16.  OXYGEN SATURATION:  97% on room air.  GENERAL:  The patient is a pleasant obese 50 year old Caucasian man who is  currently lying in bed, in no acute distress.  HEENT:  Head is normocephalic, atraumatic.  Pupils are equal, round, and  reactive to light.  Extraocular movements are intact.  Conjunctivae are  clear.  Sclerae are white.  Tympanic membrane are clear bilaterally.  Nasal  mucosa is moist.  No sinus tenderness.  Oropharynx reveals mildly dry mucous  membranes.  No posterior exudates or erythema.  His teeth are in good  repair.  NECK:  Supple, obese.  No thyromegaly, no bruit, no JVD.  LUNGS:  Decreased breath sounds in the bases.  Otherwise clear to  auscultation bilaterally.  HEART:  S1, S2, with a soft systolic murmur.  CHEST WALL:  There is a well-healed sternotomy scar.  ABDOMEN:  There are well-healed small scars throughout the abdomen, all well  healed.  His abdomen is obese.  Positive bowel sounds.  Soft, nontender,  nondistended.  No hepatosplenomegaly.  The NG tube has drained approximately  250 cc of maroon-colored gastric contents.  RECTAL:  As performed by Dr. Lynelle Doctor, the stool was black and guaiac positive.  EXTREMITIES:  The patient has well-healed small scars on both knees.  Pedal  pulses are 2+ bilaterally.  No pretibial edema, no pedal edema.  The patient has a fairly good range of motion of all  of his  extremities.  NEUROLOGIC:  The patient is alert and oriented x 3.  Cranial nerves II-XII  are intact.  Strength is 5/5 throughout.  Sensation is intact.  Gait was not  assessed.   ADMISSION LABORATORIES:  EKG revealed normal sinus rhythm, with a heart rate  of 83 beats per minute.  Left axis deviation.  Nonspecific T wave  abnormalities.  Prolonged QT of 396.  Hemoglobin 11.9, hematocrit 34.7, MCV  83.3, WBC 12.5.  PT 15.1, INR 1.3, PTT 31.  Sodium 139, potassium 4.1,  chloride 100, CO2 33, glucose 143, BUN 37, creatinine 1, calcium 8.8, total  protein 6.6, albumin 3.5, AST 20, ALT 16, alkaline phosphatase 75, total  bilirubin 1.3.   ASSESSMENT:  1.  Melena/GI Bleed.  The patient also has evidence of a bloody gastric      lavage per NG.  The patient had no  prior complaints of hematemesis or      coffee-ground emesis.  He is currently hemodynamically stable, although      his blood pressure is low normal.  His hemoglobin is mildly low at 11.8.      However, with volume repletion, will expect the hemoglobin to decrease      even further.  The patient denies alcohol use.  However, he does take a      baby aspirin per day.  Will consider PUD secondary to aspirin therapy.      Will also consider a lower GI bleed secondary to hemorrhoids versus      polyps, versus other.  The patient's colonoscopy in 2002 was essentially      negative.  2.  Mild leukocytosis.  The patient has no signs or symptoms consistent with      infection.  The leukocytosis could be reactive.  3.  Azotemia.  The patient's BUN is elevated at 37, and the creatinine is      within normal limits at 1.  The elevated BUN is probably secondary to GI      bleed.  4.  Normocytic anemia.  As above.  5.  Type II DM.   PLAN:  1.  Dr.  Marina Goodell, gastroenterologist, has been consulted by Dr. Linwood Dibbles.      Per Dr. Lynelle Doctor, Dr. Marina Goodell will see the patient later for evaluation.  2.  The patient will be admitted to a telemetry  bed.  3.  Will keep the NG tube in for now, and will add intermittent suction only      if the patient becomes nauseated.  The patient will be allowed to take      sips and chips, but will essentially keep the patient n.p.o. otherwise.  4.  Gentle volume repletion with D5-normal saline with potassium chloride      added.  Will hold the Lasix for now and restart in the a.m. at a smaller      dose.  Will watch pulmonary status.  Will order strict I&Os.  5.  Will add Protonix 40 mg IV daily.  6.  Will hold p.o. medications for now.  7.  Will start sliding scale insulin regimen and will follow the patient's      capillary blood sugars q.a.c. and at bedtime or every six hours.  8.  Will type and screen 2 units of packed red blood cells.  Will follow the      patient's CBC daily.  Will check a CBC at 12:00 noon.  9.  Will check iron studies, including  ferritin, total iron, TIBC, vitamin B-      12, folate, reticulocyte count. 10. Will check a magnesium level to assess further regarding the prolonged      QT interval.  Will also assess the patient's hemoglobin A1c and TSH.      DF/MEDQ  D:  07/08/2004  T:  07/08/2004  Job:  [id number]

## 2010-09-21 ENCOUNTER — Encounter: Payer: Self-pay | Admitting: Cardiology

## 2010-09-21 ENCOUNTER — Encounter: Payer: Self-pay | Admitting: *Deleted

## 2010-09-22 ENCOUNTER — Encounter: Payer: Self-pay | Admitting: Cardiology

## 2010-10-09 ENCOUNTER — Ambulatory Visit: Payer: Self-pay | Admitting: Cardiology

## 2010-10-13 ENCOUNTER — Ambulatory Visit: Payer: Self-pay | Admitting: Cardiology

## 2010-10-23 ENCOUNTER — Encounter: Payer: Self-pay | Admitting: Cardiology

## 2010-12-15 ENCOUNTER — Ambulatory Visit: Payer: Self-pay | Admitting: Cardiology

## 2011-01-12 LAB — COMPREHENSIVE METABOLIC PANEL
ALT: 25
AST: 26
Albumin: 4.2
Alkaline Phosphatase: 153 — ABNORMAL HIGH
BUN: 18
CO2: 30
Calcium: 9.3
Chloride: 94 — ABNORMAL LOW
Creatinine, Ser: 1.15
GFR calc Af Amer: >60
GFR calc non Af Amer: >60
Glucose, Bld: 116 — ABNORMAL HIGH
Potassium: 3.4 — ABNORMAL LOW
Sodium: 134 — ABNORMAL LOW
Total Bilirubin: 1.1
Total Protein: 8.1

## 2011-01-12 LAB — URINALYSIS, ROUTINE W REFLEX MICROSCOPIC
Bilirubin Urine: NEGATIVE
Glucose, UA: NEGATIVE
Hgb urine dipstick: NEGATIVE
Ketones, ur: NEGATIVE
Nitrite: NEGATIVE
Protein, ur: NEGATIVE
Specific Gravity, Urine: 1.011
Urobilinogen, UA: 1
pH: 7

## 2011-01-12 LAB — DIFFERENTIAL
Basophils Absolute: 0.1
Basophils Relative: 1
Eosinophils Absolute: 0.2
Eosinophils Relative: 2
Lymphocytes Relative: 15
Lymphs Abs: 1.7
Monocytes Absolute: 0.7
Monocytes Relative: 7
Neutro Abs: 8.3 — ABNORMAL HIGH
Neutrophils Relative %: 76

## 2011-01-12 LAB — HEPATIC FUNCTION PANEL
ALT: 24
AST: 28
Albumin: 4.1
Alkaline Phosphatase: 147 — ABNORMAL HIGH
Bilirubin, Direct: 0.3
Indirect Bilirubin: 0.8
Total Bilirubin: 1.1
Total Protein: 8.3

## 2011-01-12 LAB — CBC
Hemoglobin: 14.1
MCHC: 33.1
Platelets: 221
RBC: 4.74

## 2011-01-15 ENCOUNTER — Ambulatory Visit (INDEPENDENT_AMBULATORY_CARE_PROVIDER_SITE_OTHER): Payer: Self-pay | Admitting: Cardiology

## 2011-01-15 ENCOUNTER — Ambulatory Visit: Payer: Self-pay | Admitting: Cardiology

## 2011-01-15 ENCOUNTER — Telehealth: Payer: Self-pay | Admitting: Cardiology

## 2011-01-15 ENCOUNTER — Encounter: Payer: Self-pay | Admitting: *Deleted

## 2011-01-15 DIAGNOSIS — E119 Type 2 diabetes mellitus without complications: Secondary | ICD-10-CM

## 2011-01-15 DIAGNOSIS — E663 Overweight: Secondary | ICD-10-CM | POA: Insufficient documentation

## 2011-01-15 DIAGNOSIS — R011 Cardiac murmur, unspecified: Secondary | ICD-10-CM

## 2011-01-15 DIAGNOSIS — Z952 Presence of prosthetic heart valve: Secondary | ICD-10-CM | POA: Insufficient documentation

## 2011-01-15 DIAGNOSIS — Z954 Presence of other heart-valve replacement: Secondary | ICD-10-CM

## 2011-01-15 NOTE — Assessment & Plan Note (Signed)
I reviewed recent labs from 7/12.  His total cholesterol was 1:30, HDL 36, LDL 72 with a hemoglobin A1c of 7.1. Triglycerides were 111. I encouraged increased exercise for his HDL. He otherwise will continue on meds as listed and as will be followed by [name]., MD

## 2011-01-15 NOTE — Telephone Encounter (Addendum)
ROI faxed to [facility] @ [phone]  01/15/11/km  Records Received from [facility], gave to [facility] 01/26/11/km

## 2011-01-15 NOTE — Progress Notes (Signed)
HPI   The patient presents has a new patient to me having seen Dr. Deborah Chalk once.  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 He was found to endocarditis in 2005 in August when he was undergoing evaluation for possible gastric bypass. Remarkably he had his gastric surgery in December of that year.  He has had routine echocardiography. I reviewed the last of these done in Oct 2011.  This demonstrated a stable aortic replacement and mitral repair. His ejection fraction was well preserved.  He says he has been doing well. The patient denies any new symptoms such as chest discomfort, neck or arm discomfort. There has been no new shortness of breath, PND or orthopnea. There have been no reported palpitations, presyncope or syncope.  He does not walk but he is going to start pedaling a bike in his house.  He does have some joint pain with gout.   Allergies  Allergen Reactions  . Nsaids   . Penicillins   . Sulfonamide Derivatives     REACTION: swelling    Current Outpatient Prescriptions  Medication Sig Dispense Refill  . Ascorbic Acid (VITAMIN C) 1000 MG tablet Take 1,000 mg by mouth daily.        Marland Kitchen aspirin 81 MG tablet Take 81 mg by mouth 2 (two) times daily.        . Calcium Carbonate-Vitamin D (CALCIUM + D PO) Take by mouth daily.        . Cetirizine HCl (ZYRTEC ALLERGY PO) Take 10 mg by mouth daily.        . furosemide (LASIX) 20 MG tablet Take 20 mg by mouth daily.        . Melatonin 10 MG TABS Take by mouth daily.        . multivitamin (THERAGRAN) per tablet Take 1 tablet by mouth daily.          Past Medical History  Diagnosis Date  . DM   . ESOPHAGEAL ULCER, WITH BLEEDING   . Other chronic nonalcoholic liver disease   . Gallstone   . Cholecystitis   . Gastric ulcer   . Family history of colon cancer   . Dyslipidemia   . Obesity   . Hypertension   .  History of gout     Past Surgical History  Procedure Date  . Gastric bypass   . Aortic valve replacement     ROS: Knee pain. Otherwise as stated in the HPI and negative for all other systems.   PHYSICAL EXAM BP 126/80  Pulse 80  Ht 6\' 5"  (1.956 m)  Wt 375 lb (170.099 kg)  BMI 44.47 kg/m2 GENERAL:  Well appearing HEENT:  Pupils equal round and reactive, fundi not visualized, oral mucosa unremarkable NECK:  No jugular venous distention, waveform within normal limits, carotid upstroke brisk and symmetric, no bruits, no thyromegaly LYMPHATICS:  No cervical, inguinal adenopathy LUNGS:  Clear to auscultation bilaterally BACK:  No CVA tenderness CHEST:  Well healed sternotomy scar. HEART:  PMI not displaced or sustained,S1 and S2 within normal limits, no S3, no S4, no clicks, no rubs, soft apical systolic murmur ABD:  Flat, positive bowel sounds normal in frequency in pitch, no bruits, no rebound, no guarding, no midline pulsatile mass, no hepatomegaly, no splenomegaly, obese  EXT:  2 plus pulses throughout, no edema, no cyanosis no clubbing SKIN:  No rashes no  nodules NEURO:  Cranial nerves II through XII grossly intact, motor grossly intact throughout PSYCH:  Cognitively intact, oriented to person place and time   EKG:  NSR rate 80, LAD, LAFB, QTc increased.  No acute ST T wave changes.  ASSESSMENT AND PLAN

## 2011-01-15 NOTE — Assessment & Plan Note (Signed)
He did a good job with this with his surgery. We discussed further specifics for continue weight loss.

## 2011-01-15 NOTE — Patient Instructions (Signed)
Your physician has requested that you have an echocardiogram. Echocardiography is a painless test that uses sound waves to create images of your heart. It provides your doctor with information about the size and shape of your heart and how well your heart's chambers and valves are working. This procedure takes approximately one hour. There are no restrictions for this procedure.  The current medical regimen is effective;  continue present plan and medications.  Follow up in 1 year with Dr Hochrein.  You will receive a letter in the mail 2 months before you are due.  Please call us when you receive this letter to schedule your follow up appointment.  

## 2011-01-22 ENCOUNTER — Other Ambulatory Visit (HOSPITAL_COMMUNITY): Payer: Self-pay

## 2011-01-28 ENCOUNTER — Ambulatory Visit (HOSPITAL_COMMUNITY): Payer: BC Managed Care – PPO | Attending: Cardiology | Admitting: Radiology

## 2011-01-28 DIAGNOSIS — E119 Type 2 diabetes mellitus without complications: Secondary | ICD-10-CM | POA: Insufficient documentation

## 2011-01-28 DIAGNOSIS — R011 Cardiac murmur, unspecified: Secondary | ICD-10-CM | POA: Insufficient documentation

## 2011-01-28 DIAGNOSIS — Z952 Presence of prosthetic heart valve: Secondary | ICD-10-CM

## 2011-02-08 ENCOUNTER — Telehealth: Payer: Self-pay | Admitting: Cardiology

## 2011-02-08 NOTE — Telephone Encounter (Signed)
Pt calling wanting to know the results of pt ECHO. Please call back to discuss.

## 2011-02-08 NOTE — Telephone Encounter (Signed)
Echo results given to pt.

## 2011-08-18 DIAGNOSIS — E11621 Type 2 diabetes mellitus with foot ulcer: Secondary | ICD-10-CM

## 2011-08-18 DIAGNOSIS — L97509 Non-pressure chronic ulcer of other part of unspecified foot with unspecified severity: Secondary | ICD-10-CM

## 2011-08-18 DIAGNOSIS — M2060 Acquired deformities of toe(s), unspecified, unspecified foot: Secondary | ICD-10-CM

## 2011-08-18 HISTORY — DX: Acquired deformities of toe(s), unspecified, unspecified foot: M20.60

## 2011-08-18 HISTORY — DX: Type 2 diabetes mellitus with foot ulcer: E11.621

## 2011-08-18 HISTORY — DX: Type 2 diabetes mellitus with foot ulcer: L97.509

## 2011-09-06 ENCOUNTER — Encounter (HOSPITAL_BASED_OUTPATIENT_CLINIC_OR_DEPARTMENT_OTHER)
Admission: RE | Admit: 2011-09-06 | Discharge: 2011-09-06 | Disposition: A | Discharge status: Home / Self Care | Payer: BC Managed Care – PPO | Source: Ambulatory Visit | Attending: Orthopedic Surgery | Admitting: Orthopedic Surgery

## 2011-09-06 ENCOUNTER — Encounter (HOSPITAL_BASED_OUTPATIENT_CLINIC_OR_DEPARTMENT_OTHER): Payer: Self-pay | Admitting: *Deleted

## 2011-09-06 ENCOUNTER — Ambulatory Visit
Admission: RE | Admit: 2011-09-06 | Discharge: 2011-09-06 | Disposition: A | Discharge status: Home / Self Care | Payer: BC Managed Care – PPO | Source: Ambulatory Visit | Attending: Anesthesiology | Admitting: Anesthesiology

## 2011-09-06 LAB — BASIC METABOLIC PANEL WITH GFR
BUN: 15 mg/dL (ref 6–23)
CO2: 29 meq/L (ref 19–32)
Calcium: 9.6 mg/dL (ref 8.4–10.5)
Chloride: 99 meq/L (ref 96–112)
Creatinine, Ser: 1.07 mg/dL (ref 0.50–1.35)
GFR calc Af Amer: >90 mL/min (ref >90)
GFR calc non Af Amer: 79 mL/min — ABNORMAL LOW (ref >90)
Glucose, Bld: 143 mg/dL — ABNORMAL HIGH (ref 70–99)
Potassium: 4 meq/L (ref 3.5–5.1)
Sodium: 140 meq/L (ref 135–145)

## 2011-09-06 NOTE — Pre-Procedure Instructions (Signed)
To come for BMET and CXR. 

## 2011-09-09 ENCOUNTER — Encounter (HOSPITAL_BASED_OUTPATIENT_CLINIC_OR_DEPARTMENT_OTHER): Payer: Self-pay | Admitting: Certified Registered Nurse Anesthetist

## 2011-09-09 ENCOUNTER — Ambulatory Visit (HOSPITAL_BASED_OUTPATIENT_CLINIC_OR_DEPARTMENT_OTHER): Payer: BC Managed Care – PPO | Admitting: Certified Registered Nurse Anesthetist

## 2011-09-09 ENCOUNTER — Encounter (HOSPITAL_BASED_OUTPATIENT_CLINIC_OR_DEPARTMENT_OTHER)
Admission: RE | Disposition: A | Discharge status: Home / Self Care | Payer: Self-pay | Source: Ambulatory Visit | Attending: Orthopedic Surgery

## 2011-09-09 ENCOUNTER — Ambulatory Visit (HOSPITAL_BASED_OUTPATIENT_CLINIC_OR_DEPARTMENT_OTHER)
Admission: RE | Admit: 2011-09-09 | Discharge: 2011-09-09 | Disposition: A | Discharge status: Home / Self Care | Payer: BC Managed Care – PPO | Source: Ambulatory Visit | Attending: Orthopedic Surgery | Admitting: Orthopedic Surgery

## 2011-09-09 ENCOUNTER — Encounter (HOSPITAL_BASED_OUTPATIENT_CLINIC_OR_DEPARTMENT_OTHER): Payer: Self-pay | Admitting: *Deleted

## 2011-09-09 DIAGNOSIS — G4730 Sleep apnea, unspecified: Secondary | ICD-10-CM | POA: Insufficient documentation

## 2011-09-09 DIAGNOSIS — M6240 Contracture of muscle, unspecified site: Secondary | ICD-10-CM | POA: Insufficient documentation

## 2011-09-09 DIAGNOSIS — E1169 Type 2 diabetes mellitus with other specified complication: Secondary | ICD-10-CM | POA: Insufficient documentation

## 2011-09-09 DIAGNOSIS — E669 Obesity, unspecified: Secondary | ICD-10-CM | POA: Insufficient documentation

## 2011-09-09 DIAGNOSIS — E11621 Type 2 diabetes mellitus with foot ulcer: Secondary | ICD-10-CM

## 2011-09-09 DIAGNOSIS — K219 Gastro-esophageal reflux disease without esophagitis: Secondary | ICD-10-CM | POA: Insufficient documentation

## 2011-09-09 DIAGNOSIS — I10 Essential (primary) hypertension: Secondary | ICD-10-CM | POA: Insufficient documentation

## 2011-09-09 DIAGNOSIS — Z01812 Encounter for preprocedural laboratory examination: Secondary | ICD-10-CM | POA: Insufficient documentation

## 2011-09-09 DIAGNOSIS — L97509 Non-pressure chronic ulcer of other part of unspecified foot with unspecified severity: Secondary | ICD-10-CM | POA: Insufficient documentation

## 2011-09-09 DIAGNOSIS — Q667 Congenital pes cavus, unspecified foot: Secondary | ICD-10-CM | POA: Insufficient documentation

## 2011-09-09 HISTORY — DX: Presence of prosthetic heart valve: Z95.2

## 2011-09-09 HISTORY — DX: Dental restoration status: Z98.811

## 2011-09-09 HISTORY — DX: Type 2 diabetes mellitus with foot ulcer: E11.621

## 2011-09-09 HISTORY — DX: Gastro-esophageal reflux disease without esophagitis: K21.9

## 2011-09-09 HISTORY — DX: Fatty (change of) liver, not elsewhere classified: K76.0

## 2011-09-09 HISTORY — PX: TENDON RELEASE: SHX230

## 2011-09-09 HISTORY — DX: Sleep apnea, unspecified: G47.30

## 2011-09-09 HISTORY — DX: Non-pressure chronic ulcer of other part of unspecified foot with unspecified severity: L97.509

## 2011-09-09 HISTORY — DX: Personal history of other diseases of the circulatory system: Z86.79

## 2011-09-09 HISTORY — DX: Acquired deformities of toe(s), unspecified, unspecified foot: M20.60

## 2011-09-09 LAB — GLUCOSE, CAPILLARY: Glucose-Capillary: 147 mg/dL — ABNORMAL HIGH (ref 70–99)

## 2011-09-09 SURGERY — TRANSFER, TENDON
Anesthesia: General | Site: Foot | Laterality: Right | Wound class: Clean

## 2011-09-09 MED ORDER — HYDROMORPHONE HCL PF 1 MG/ML IJ SOLN: 0.2500 mg | INTRAMUSCULAR | Status: DC | PRN

## 2011-09-09 MED ORDER — FENTANYL CITRATE 0.05 MG/ML IJ SOLN
50.0000 ug | INTRAMUSCULAR | Status: DC | PRN
  Administered 2011-09-09: 100 ug via INTRAVENOUS

## 2011-09-09 MED ORDER — OXYCODONE HCL 5 MG PO TABS: 5.0000 mg | ORAL_TABLET | ORAL | Status: AC | PRN

## 2011-09-09 MED ORDER — MIDAZOLAM HCL 2 MG/2ML IJ SOLN
0.5000 mg | INTRAMUSCULAR | Status: DC | PRN
  Administered 2011-09-09: 2 mg via INTRAVENOUS

## 2011-09-09 MED ORDER — CEFAZOLIN SODIUM-DEXTROSE 2-3 GM-% IV SOLR
2.0000 g | INTRAVENOUS | Status: AC
  Administered 2011-09-09: 2 g via INTRAVENOUS

## 2011-09-09 MED ORDER — BACITRACIN ZINC 500 UNIT/GM EX OINT
TOPICAL_OINTMENT | CUTANEOUS | Status: DC | PRN
  Administered 2011-09-09: 1 via TOPICAL

## 2011-09-09 MED ORDER — LACTATED RINGERS IV SOLN
INTRAVENOUS | Status: DC
  Administered 2011-09-09 (×2): via INTRAVENOUS

## 2011-09-09 MED ORDER — SODIUM CHLORIDE 0.9 % IV SOLN: INTRAVENOUS | Status: DC

## 2011-09-09 MED ORDER — LIDOCAINE HCL (CARDIAC) 20 MG/ML IV SOLN
INTRAVENOUS | Status: DC | PRN
  Administered 2011-09-09: 50 mg via INTRAVENOUS

## 2011-09-09 MED ORDER — CHLORHEXIDINE GLUCONATE 4 % EX LIQD: 60.0000 mL | Freq: Once | CUTANEOUS | Status: DC

## 2011-09-09 MED ORDER — DEXAMETHASONE SODIUM PHOSPHATE 10 MG/ML IJ SOLN
INTRAMUSCULAR | Status: DC | PRN
  Administered 2011-09-09: 5 mg via INTRAVENOUS

## 2011-09-09 MED ORDER — ONDANSETRON HCL 4 MG/2ML IJ SOLN
INTRAMUSCULAR | Status: DC | PRN
  Administered 2011-09-09 (×2): 4 mg via INTRAVENOUS

## 2011-09-09 MED ORDER — METOCLOPRAMIDE HCL 5 MG/ML IJ SOLN: 10.0000 mg | Freq: Once | INTRAMUSCULAR | Status: DC | PRN

## 2011-09-09 MED ORDER — ROPIVACAINE HCL 5 MG/ML IJ SOLN
INTRAMUSCULAR | Status: DC | PRN
  Administered 2011-09-09: 25 mL via EPIDURAL

## 2011-09-09 MED ORDER — PROPOFOL 10 MG/ML IV EMUL
INTRAVENOUS | Status: DC | PRN
  Administered 2011-09-09: 250 mg via INTRAVENOUS

## 2011-09-09 MED ORDER — LIDOCAINE-EPINEPHRINE 1.5-1:200000 % IJ SOLN
INTRAMUSCULAR | Status: DC | PRN
  Administered 2011-09-09: 25 mL via INTRADERMAL

## 2011-09-09 MED ORDER — LIDOCAINE HCL 1 % IJ SOLN
INTRAMUSCULAR | Status: DC | PRN
  Administered 2011-09-09: 2 mL via INTRADERMAL

## 2011-09-09 MED ORDER — METOCLOPRAMIDE HCL 5 MG/ML IJ SOLN
INTRAMUSCULAR | Status: DC | PRN
  Administered 2011-09-09: 10 mg via INTRAVENOUS

## 2011-09-09 SURGICAL SUPPLY — 74 items
BAG DECANTER FOR FLEXI CONT (MISCELLANEOUS) ×1 IMPLANT
BANDAGE CONFORM 2  STR LF (GAUZE/BANDAGES/DRESSINGS) IMPLANT
BANDAGE ESMARK 6X9 LF (GAUZE/BANDAGES/DRESSINGS) IMPLANT
BIT DRILL 2.9 CANN QC NONSTRL (BIT) ×1 IMPLANT
BLADE AVERAGE 25X9 (BLADE) ×1 IMPLANT
BLADE OSC/SAG .038X5.5 CUT EDG (BLADE) IMPLANT
BLADE SURG 15 STRL LF DISP TIS (BLADE) ×2 IMPLANT
BLADE SURG 15 STRL SS (BLADE) ×6
BNDG CMPR 9X6 STRL LF SNTH (GAUZE/BANDAGES/DRESSINGS) ×1
BNDG COHESIVE 4X5 TAN STRL (GAUZE/BANDAGES/DRESSINGS) ×2 IMPLANT
BNDG COHESIVE 6X5 TAN STRL LF (GAUZE/BANDAGES/DRESSINGS) ×2 IMPLANT
BNDG ESMARK 6X9 LF (GAUZE/BANDAGES/DRESSINGS) ×2
CHLORAPREP W/TINT 26ML (MISCELLANEOUS) ×2 IMPLANT
CLOTH BEACON ORANGE TIMEOUT ST (SAFETY) ×2 IMPLANT
COVER MAYO STAND STRL (DRAPES) IMPLANT
COVER TABLE BACK 60X90 (DRAPES) ×2 IMPLANT
CUFF TOURNIQUET SINGLE 18IN (TOURNIQUET CUFF) IMPLANT
CUFF TOURNIQUET SINGLE 34IN LL (TOURNIQUET CUFF) ×1 IMPLANT
DECANTER SPIKE VIAL GLASS SM (MISCELLANEOUS) IMPLANT
DRAPE EXTREMITY T 121X128X90 (DRAPE) ×2 IMPLANT
DRAPE INCISE IOBAN 66X45 STRL (DRAPES) IMPLANT
DRAPE OEC MINIVIEW 54X84 (DRAPES) ×2 IMPLANT
DRAPE U-SHAPE 47X51 STRL (DRAPES) ×2 IMPLANT
DRAPE U-SHAPE 76X120 STRL (DRAPES) IMPLANT
DRSG EMULSION OIL 3X3 NADH (GAUZE/BANDAGES/DRESSINGS) ×2 IMPLANT
DRSG PAD ABDOMINAL 8X10 ST (GAUZE/BANDAGES/DRESSINGS) ×4 IMPLANT
DRSG TEGADERM 2-3/8X2-3/4 SM (GAUZE/BANDAGES/DRESSINGS) IMPLANT
DURA STEPPER LG (CAST SUPPLIES) ×1 IMPLANT
DURA STEPPER MED (CAST SUPPLIES) IMPLANT
ELECT REM PT RETURN 9FT ADLT (ELECTROSURGICAL) ×2
ELECTRODE REM PT RTRN 9FT ADLT (ELECTROSURGICAL) ×1 IMPLANT
GLOVE BIO SURGEON STRL SZ8 (GLOVE) ×2 IMPLANT
GLOVE BIOGEL PI IND STRL 8 (GLOVE) ×1 IMPLANT
GLOVE BIOGEL PI INDICATOR 8 (GLOVE) ×1
GLOVE ECLIPSE 6.5 STRL STRAW (GLOVE) ×1 IMPLANT
GOWN PREVENTION PLUS XLARGE (GOWN DISPOSABLE) ×1 IMPLANT
GOWN PREVENTION PLUS XXLARGE (GOWN DISPOSABLE) ×1 IMPLANT
K-WIRE ACE 1.6X6 (WIRE) ×2
KWIRE ACE 1.6X6 (WIRE) IMPLANT
NDL SAFETY ECLIPSE 18X1.5 (NEEDLE) IMPLANT
NEEDLE HYPO 18GX1.5 SHARP (NEEDLE)
NEEDLE HYPO 22GX1.5 SAFETY (NEEDLE) IMPLANT
PACK BASIN DAY SURGERY FS (CUSTOM PROCEDURE TRAY) ×2 IMPLANT
PAD CAST 4YDX4 CTTN HI CHSV (CAST SUPPLIES) ×1 IMPLANT
PADDING CAST ABS 4INX4YD NS (CAST SUPPLIES)
PADDING CAST ABS COTTON 4X4 ST (CAST SUPPLIES) IMPLANT
PADDING CAST COTTON 4X4 STRL (CAST SUPPLIES) ×2
PADDING CAST COTTON 6X4 STRL (CAST SUPPLIES) ×2 IMPLANT
PASSER SUT SWANSON 36MM LOOP (INSTRUMENTS) ×1 IMPLANT
PENCIL BUTTON HOLSTER BLD 10FT (ELECTRODE) ×2 IMPLANT
SCREW ACE CAN 4.0 44M (Screw) ×1 IMPLANT
SHEET MEDIUM DRAPE 40X70 STRL (DRAPES) ×3 IMPLANT
SPLINT FAST PLASTER 5X30 (CAST SUPPLIES)
SPLINT PLASTER CAST FAST 5X30 (CAST SUPPLIES) IMPLANT
SPONGE GAUZE 4X4 12PLY (GAUZE/BANDAGES/DRESSINGS) ×1 IMPLANT
SPONGE LAP 18X18 X RAY DECT (DISPOSABLE) ×3 IMPLANT
STAPLER VISISTAT 35W (STAPLE) IMPLANT
STOCKINETTE 6  STRL (DRAPES) ×1
STOCKINETTE 6 STRL (DRAPES) ×1 IMPLANT
STRIP CLOSURE SKIN 1/2X4 (GAUZE/BANDAGES/DRESSINGS) IMPLANT
SUCTION FRAZIER TIP 10 FR DISP (SUCTIONS) IMPLANT
SUT ETHILON 3 0 PS 1 (SUTURE) IMPLANT
SUT MERSILENE 2.0 SH NDLE (SUTURE) ×1 IMPLANT
SUT MNCRL AB 3-0 PS2 18 (SUTURE) ×2 IMPLANT
SUT PROLENE 3 0 PS 2 (SUTURE) ×2 IMPLANT
SUT VIC AB 0 SH 27 (SUTURE) ×2 IMPLANT
SUT VIC AB 2-0 SH 18 (SUTURE) IMPLANT
SUT VIC AB 2-0 SH 27 (SUTURE)
SUT VIC AB 2-0 SH 27XBRD (SUTURE) IMPLANT
SUT VICRYL 4-0 PS2 18IN ABS (SUTURE) IMPLANT
SYR BULB 3OZ (MISCELLANEOUS) ×2 IMPLANT
TUBE CONNECTING 20X1/4 (TUBING) IMPLANT
UNDERPAD 30X30 INCONTINENT (UNDERPADS AND DIAPERS) ×2 IMPLANT
WATER STERILE IRR 1000ML POUR (IV SOLUTION) ×1 IMPLANT

## 2011-09-09 NOTE — Brief Op Note (Signed)
09/09/2011  10:40 AM  PATIENT:  Philip [name]  51 y.o. male  PRE-OPERATIVE DIAGNOSIS:  Right claw hallux, diabetic ulcer, tight heel cord  POST-OPERATIVE DIAGNOSIS:  Right claw hallux, diabetic ulcer, tight heel cord  Procedure(s): 1.  Right hallux Jones procedure 2.  Right perc tendoachilles lengthening 3.  Right hallux mtp joint dorsal capsulotomy 4.  Right foot plantar ulcer debridement 1 cm x 1 cm (skin and subcut tissue)  SURGEON:  Toni Arthurs, MD  ASSISTANT: n/a  ANESTHESIA:   General, regional  EBL:  minimal   TOURNIQUET:   Total Tourniquet Time Documented: Thigh (Right) - 63 minutes  COMPLICATIONS:  None apparent  DISPOSITION:  Extubated, awake and stable to recovery.  DICTATION ID:  [id number]

## 2011-09-09 NOTE — Transfer of Care (Signed)
Immediate Anesthesia Transfer of Care Note  Patient: Philip [name]  Procedure(s) Performed: Procedure(s) (LRB): TENDON TRANSFER (Right) HEEL CORD LENGTHENING (Right)  Patient Location: PACU  Anesthesia Type: General and Regional  Level of Consciousness: awake, alert , oriented and patient cooperative  Airway & Oxygen Therapy: Patient Spontanous Breathing and Patient connected to face mask oxygen  Post-op Assessment: Report given to PACU RN and Post -op Vital signs reviewed and stable  Post vital signs: Reviewed and stable  Complications: No apparent anesthesia complications

## 2011-09-09 NOTE — Discharge Instructions (Addendum)
Toni Arthurs, MD [facility]  Please read the following information regarding your care after surgery.  Medications  You only need a prescription for the narcotic pain medicine (ex. oxycodone, Percocet, Norco).  All of the other medicines listed below are available over the counter. X acetominophen (Tylenol) 650 mg every 4-6 hours as you need for minor pain X oxycodone as prescribed for moderate to severe pain ?   Narcotic pain medicine (ex. oxycodone, Percocet, Vicodin) will cause constipation.  To prevent this problem, take the following medicines while you are taking any pain medicine. X docusate sodium (Colace) 100 mg twice a day X senna (Senokot) 2 tablets twice a day  ? To help prevent blood clots, take an aspirin (325 mg) once a day for a month after surgery.  You should also get up every hour while you are awake to move around.    Weight Bearing ? Bear weight when you are able on your operated leg or foot. X Bear weight on your operated foot in the CAM boot. ? Do not bear any weight on the operated leg or foot.  Cast / Splint / Dressing X Keep your splint or cast clean and dry.  Don't put anything (coat hanger, pencil, etc) down inside of it.  If it gets damp, use a hair dryer on the cool setting to dry it.  If it gets soaked, call the office to schedule an appointment for a cast change. ? Remove your dressing 3 days after surgery and cover the incisions with dry dressings.    After your dressing, cast or splint is removed; you may shower, but do not soak or scrub the wound.  Allow the water to run over it, and then gently pat it dry.  Swelling It is normal for you to have swelling where you had surgery.  To reduce swelling and pain, keep your toes above your nose for at least 3 days after surgery.  It may be necessary to keep your foot or leg elevated for several weeks.  If it hurts, it should be elevated.  Follow Up Call my office at [phone] when you are  discharged from the hospital or surgery center to schedule an appointment to be seen two weeks after surgery.  Call my office at [phone] if you develop a fever >101.5 F, nausea, vomiting, bleeding from the surgical site or severe pain.      Post Anesthesia Home Care Instructions  Activity: Get plenty of rest for the remainder of the day. A responsible adult should stay with you for 24 hours following the procedure.  For the next 24 hours, DO NOT: -Drive a car -Advertising copywriter -Drink alcoholic beverages -Take any medication unless instructed by your physician -Make any legal decisions or sign important papers.  Meals: Start with liquid foods such as gelatin or soup. Progress to regular foods as tolerated. Avoid greasy, spicy, heavy foods. If nausea and/or vomiting occur, drink only clear liquids until the nausea and/or vomiting subsides. Call your physician if vomiting continues.  Special Instructions/Symptoms: Your throat may feel dry or sore from the anesthesia or the breathing tube placed in your throat during surgery. If this causes discomfort, gargle with warm salt water. The discomfort should disappear within 24 hours.    Regional Anesthesia Blocks  1. Numbness or the inability to move the "blocked" extremity may last from 3-48 hours after placement. The length of time depends on the medication injected and your individual response to the medication. If  the numbness is not going away after 48 hours, call your surgeon.  2. The extremity that is blocked will need to be protected until the numbness is gone and the  Strength has returned. Because you cannot feel it, you will need to take extra care to avoid injury. Because it may be weak, you may have difficulty moving it or using it. You may not know what position it is in without looking at it while the block is in effect.  3. For blocks in the legs and feet, returning to weight bearing and walking needs to be done carefully.  You will need to wait until the numbness is entirely gone and the strength has returned. You should be able to move your leg and foot normally before you try and bear weight or walk. You will need someone to be with you when you first try to ensure you do not fall and possibly risk injury.  4. Bruising and tenderness at the needle site are common side effects and will resolve in a few days.  5. Persistent numbness or new problems with movement should be communicated to the surgeon or the [facility] [phone] [facility] [phone]).

## 2011-09-09 NOTE — Anesthesia Preprocedure Evaluation (Signed)
Anesthesia Evaluation  Patient identified by MRN, date of birth, ID band Patient awake    Reviewed: Allergy & Precautions, H&P , NPO status , Patient's Chart, lab work & pertinent test results, reviewed documented beta blocker date and time   Airway Mallampati: II TM Distance: >3 FB Neck ROM: full    Dental   Pulmonary sleep apnea ,          Cardiovascular hypertension, + Valvular Problems/Murmurs     Neuro/Psych negative neurological ROS  negative psych ROS   GI/Hepatic negative GI ROS, Neg liver ROS, PUD, GERD-  Medicated and Controlled,  Endo/Other  Diabetes mellitus-, Oral Hypoglycemic AgentsMorbid obesity  Renal/GU negative Renal ROS  negative genitourinary   Musculoskeletal   Abdominal   Peds  Hematology negative hematology ROS (+)   Anesthesia Other Findings See surgeon's H&P   Reproductive/Obstetrics negative OB ROS                           Anesthesia Physical Anesthesia Plan  ASA: III  Anesthesia Plan: General   Post-op Pain Management:    Induction: Intravenous  Airway Management Planned: LMA  Additional Equipment:   Intra-op Plan:   Post-operative Plan: Extubation in OR  Informed Consent: I have reviewed the patients History and Physical, chart, labs and discussed the procedure including the risks, benefits and alternatives for the proposed anesthesia with the patient or authorized representative who has indicated his/her understanding and acceptance.   Dental Advisory Given  Plan Discussed with: CRNA and Surgeon  Anesthesia Plan Comments:         Anesthesia Quick Evaluation

## 2011-09-09 NOTE — Anesthesia Procedure Notes (Addendum)
Anesthesia Regional Block:  Popliteal block  Pre-Anesthetic Checklist: ,, timeout performed, Correct Patient, Correct Site, Correct Laterality, Correct Procedure, Correct Position, site marked, Risks and benefits discussed,  Surgical consent,  Pre-op evaluation,  At surgeon's request and post-op pain management  Laterality: Right  Prep: chloraprep       Needles:   Needle Type: Other   (Arrow Echogenic)   Needle Length: 9cm  Needle Gauge: 21    Additional Needles:  Procedures: ultrasound guided Popliteal block Narrative:  Start time: 09/09/2011 8:36 AM End time: 09/09/2011 8:46 AM Injection made incrementally with aspirations every 5 mL.  Performed by: Personally  Anesthesiologist: Aldona Lento, MD  Additional Notes: Ultrasound guidance used to: id relevant anatomy, confirm needle position, local anesthetic spread, avoidance of vascular puncture. Picture saved. No complications. Block performed personally by Janetta Hora. Gelene Mink, MD  .    Popliteal block Procedure Name: LMA Insertion Date/Time: 09/09/2011 9:10 AM Performed by: Shen[place]ah Vandergriff D Pre-anesthesia Checklist: Patient identified, Emergency Drugs available, Suction available and Patient being monitored Patient Re-evaluated:Patient Re-evaluated prior to inductionOxygen Delivery Method: Circle System Utilized Preoxygenation: Pre-oxygenation with 100% oxygen Intubation Type: IV induction Ventilation: Mask ventilation without difficulty LMA: LMA with gastric port inserted LMA Size: 5.0 Number of attempts: 1 Placement Confirmation: positive ETCO2 Tube secured with: Tape Dental Injury: Teeth and Oropharynx as per pre-operative assessment

## 2011-09-09 NOTE — Progress Notes (Signed)
Assisted Dr. Frederick with right, ultrasound guided, popliteal block. Side rails up, monitors on throughout procedure. See vital signs in flow sheet. Tolerated Procedure well. 

## 2011-09-09 NOTE — H&P (Signed)
Philip [name] is an 51 y.o. male.   Chief Complaint: non healing right foot ulcer HPI: 51 y/o male with recurrent right foot plantar ulcer under the 1st MT head.  Pt has undergone DF osteotomy of right 1st MT and gastroc recession but has a hallux claw toe deformity and recurrence of tight heelcord.  He has failed treatment with local wound care, custom orthotics and activity modification.  He presents now for Jones procedure and heelcord lengthening.  Past Medical History  Diagnosis Date  . Gastric ulcer     no current problems  . Obesity   . Seasonal allergies   . Gout   . GERD (gastroesophageal reflux disease)   . Sleep apnea     uses CPAP nightly  . Fatty liver   . Hypertension     under control; states med. is really to protect kidneys due to diabetes  . Diabetes mellitus     NIDDM  . Dental crowns present   . 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 Hx of bacterial endocarditis 2005    Past Surgical History  Procedure Date  . Gastric bypass 03/2004  . Aortic valve replacement 11/2003  . Tonsillectomy and adenoidectomy   . Nasal sinus surgery   . Metatarsal osteotomy 03/20/2010    right 1st MT; gastroc soleus recession  . Knee arthroscopy 08/09/2007 - left    07/15/2003 - right  . Toe fusion 07/12/2005    left 3rd toe PIP and DIP fusion    Family History  Problem Relation Age of Onset  . Colon cancer      family history   Social History:  reports that he has been smoking Cigars.  He has never used smokeless tobacco. He reports that he drinks alcohol. He reports that he does not use illicit drugs.  Allergies:  Allergies  Allergen Reactions  . Nsaids Swelling    LIPS AND NECK  . Penicillins Hives and Swelling    Medications Prior to Admission  Medication Sig Dispense Refill  . allopurinol (ZYLOPRIM) 300 MG tablet Take 1 tablet by  mouth Daily. AM      . Ascorbic Acid (VITAMIN C) 1000 MG tablet Take 1,000 mg by mouth daily.        Marland Kitchen aspirin 81 MG tablet Take 81 mg by mouth 2 (two) times daily.        . Calcium Carbonate-Vitamin D (CALCIUM + D PO) Take by mouth daily.        . Cetirizine HCl (ZYRTEC ALLERGY PO) Take 10 mg by mouth daily.        . furosemide (LASIX) 20 MG tablet Take 20 mg by mouth daily. AM      . glimepiride (AMARYL) 2 MG tablet Take 1 tablet by mouth Daily. AM      . JANUMET 50-1000 MG per tablet Take 1 tablet by mouth Twice daily.      Marland Kitchen KLOR-CON 8 MEQ CR tablet Take 1 tablet by mouth Daily. AM      . Melatonin 10 MG TABS Take by mouth daily.        . multivitamin (THERAGRAN) per tablet Take 1 tablet by mouth daily.        Marland Kitchen NEXIUM 40 MG capsule Take 1 tablet by mouth daily. PM      .  ramipril (ALTACE) 5 MG capsule Take 1 tablet by mouth Daily. PM          ROS  No recent f/c/n/v/wt loss.  Height 6\' 5"  (1.956 m), weight 167.831 kg (370 lb). Physical Exam wn wd male in nad.  A and O x 4.  Mood and affect normal.  EOMI.  Repsirations unlabored.  R foot with 1 cm ulcer at plantar foot.  No signs of infection.  Hallux in claw position.  Skin o/w intact.  Diminished sens to LT at forefoot.  Tight heelcord.  Pulses palpable.  No lymphadenopathy.  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  To OR for Jones procedure and heelcord lengthening.  The risks and benefits of the alternative treatment options have been discussed in detail.  The patient wishes to proceed with surgery and specifically understands risks of bleeding, infection, nerve damage, blood clots, need for additional surgery, amputation and death.   Toni Arthurs October 04, 2011, 7:23 AM

## 2011-09-09 NOTE — Progress Notes (Signed)
Discussed patient allergy to penicillin and order for ancef.  Pt. Has received cephalosporins previously and Dr. Victorino Dike feels the ancef will be OK.

## 2011-09-09 NOTE — Op Note (Signed)
[name], SITES NO.:  [id number]  MEDICAL RECORD NO.:  [id number]  LOCATION:                                 FACILITY:  PHYSICIAN:  Toni Arthurs, MD             DATE OF BIRTH:  DATE OF PROCEDURE:  09/09/2011 DATE OF DISCHARGE:                              OPERATIVE REPORT   PREOPERATIVE DIAGNOSES: 1. Right foot plantar diabetic ulcer. 2. Right hallux claw-toe deformity. 3. Tight heel cord, right leg.  POSTOPERATIVE DIAGNOSES: 1. Right foot plantar diabetic ulcer. 2. Right hallux claw-toe deformity. 3. Tight heel cord, right leg.  PROCEDURE: 1. Right hallux, Jones procedure. 2. Right percutaneous tendo-Achilles lengthening. 3. Right hallux MTP joint dorsal capsulotomy. 4. Right foot plantar ulcer debridement including skin and     subcutaneous tissue, 1 cm x 1 cm x 1 cm deep.  SURGEON:  Toni Arthurs, MD.  ANESTHESIA:  General, regional.  ESTIMATED BLOOD LOSS:  Minimal.  TOURNIQUET TIME:  63 minutes at 225 mmHg.  COMPLICATIONS:  None apparent.  DISPOSITION:  Extubated, awake, and stable to recovery.  INDICATIONS FOR PROCEDURE:  The patient is a 51 year old male with a history of type 2 diabetes and a plantar foot ulcer.  He has previously undergone open gastrocsoleus recession as well as dorsiflexion osteotomy of the first metatarsal base.  He continues to have trouble with a nonhealing plantar ulcer under the first metatarsal head.  He has failed treatment with custom orthotics and activity modification.  He presents now for a Jones procedure to treat his claw hallux and percutaneous tendo-Achilles lengthening.  He understands the risks and benefits, and the alternative treatment options and elects surgical treatment.  He specifically understands risks of bleeding, infection, nerve damage, blood clots, need for additional surgery, amputation, and death.  PROCEDURE IN DETAIL:  After preoperative consent was obtained and the correct operative  site was identified, the patient was brought to the operating room and placed supine on the operating table.  General anesthesia was induced.  Preoperative antibiotics were administered. Surgical time-out was taken.  Right lower extremity was prepped and draped in standard sterile fashion with a tourniquet around the thigh. The extremity was exsanguinated and the tourniquet was inflated to 225 mmHg.  The patient's Achilles tendon was palpated.  A triple hemisection percutaneous tendo-Achilles lengthening was performed.  The ankle could then dorsiflex passively approximately 30 degrees.  Attention was then turned to the dorsal aspect of the foot.  A longitudinal incision was made just medial to the EHL tendon.  The tendon was isolated and released from its adjacent soft tissues.  A transverse incision was then made over the IP joint of the hallux.  The EHL was identified at its insertion and it was released medially, laterally, and distally.  The EHL tendon was then withdrawn from the proximal incision and whip stitches of 0 Vicryl was placed in the end. Attention was then returned to the IP joint.  The collateral ligaments were exposed. The IP joint was then exposed and an oscillating saw was used to resect the head of the proximal phalanx up to the  level of subchondral bone.  The base of the distal phalanx was also resected with the oscillating saw, exposing healthy bone.  The K-wire was used to perforate the head to the base in multiple locations.  The joint was reduced.  A 1.6-mm K-wire was introduced through the tip of the toe and across the arthrodesis site.  AP and lateral views confirmed appropriate position of the guide pin and appropriate reduction of the IP joint.  A 4-mm cannulated lag screw was then inserted from the tip of the toe across the joint and was noted to have excellent purchase.  Final AP and lateral views showed appropriate compression of the IP joint  and appropriate position and length of the screw.  Attention was then returned to the dorsal incision.  The dorsal joint capsule was noted to be contracted restricting plantar flexion of the hallux.  The dorsal joint capsule was excised in its entirety, creating a capsulotomy, allowing appropriate plantar flexion of the hallux MP joint.  Two drill holes were made at the neck of the metatarsal intersecting each other.  A suture-passer was then used to pull the suture from the EHL tendon through this hole.  The EHL tendon was then pulled through the hole and doubled back on itself.  With the ankle in appropriate neutral position, the resting tension of the EHL was set and the tendon was sutured to itself using horizontal mattress sutures of 3- 0 Ethibond.  The tendon was also repaired to the periosteum of the neck of the metatarsal.  The end of the tendon was trimmed.  The wound was irrigated copiously.  Inverted simple sutures of 3-0 Monocryl were used to close subcutaneous tissue and running 3-0 Prolene was used to close the dorsal incision.  The dorsal wound over the IP joint was similarly closed and a horizontal mattress suture of 3-0 Prolene was used to close the incision at the tip of the toe.  Sterile dressings were applied, followed by a well-padded compression wrap.  The patient was then placed in a Cam boot with the ankle in neutral position.  The patient was awakened from anesthesia and transported to recovery room in stable condition.  The tourniquet had been released at 63 minutes after application of the dressings.  FOLLOWUP PLAN:  The patient will be weightbearing as tolerated on his right foot in the Cam boot.  He will follow up with me in 2 weeks for suture removal.     Toni Arthurs, MD     JH/MEDQ  D:  09/09/2011  T:  09/09/2011  Job:  [id number]

## 2011-09-09 NOTE — Anesthesia Postprocedure Evaluation (Signed)
Anesthesia Post Note  Patient: Philip [name]  Procedure(s) Performed: Procedure(s) (LRB): TENDON TRANSFER (Right) HEEL CORD LENGTHENING (Right)  Anesthesia type: General  Patient location: PACU  Post pain: Pain level controlled  Post assessment: Patient's Cardiovascular Status Stable  Last Vitals:  Filed Vitals:   09/09/11 1144  BP: 126/76  Pulse: 79  Temp: 36.7 C  Resp: 16    Post vital signs: Reviewed and stable  Level of consciousness: alert  Complications: No apparent anesthesia complications

## 2011-09-14 ENCOUNTER — Encounter (HOSPITAL_BASED_OUTPATIENT_CLINIC_OR_DEPARTMENT_OTHER): Payer: Self-pay | Admitting: Orthopedic Surgery

## 2011-09-15 ENCOUNTER — Encounter (HOSPITAL_BASED_OUTPATIENT_CLINIC_OR_DEPARTMENT_OTHER): Payer: Self-pay

## 2012-01-17 ENCOUNTER — Ambulatory Visit: Payer: BC Managed Care – PPO | Admitting: Cardiology

## 2012-01-31 ENCOUNTER — Encounter: Payer: Self-pay | Admitting: Cardiology

## 2012-01-31 ENCOUNTER — Ambulatory Visit (INDEPENDENT_AMBULATORY_CARE_PROVIDER_SITE_OTHER): Payer: BC Managed Care – PPO | Admitting: Cardiology

## 2012-01-31 VITALS — BP 120/75 | HR 88 | Ht 77.0 in | Wt 370.1 lb

## 2012-01-31 DIAGNOSIS — I359 Nonrheumatic aortic valve disorder, unspecified: Secondary | ICD-10-CM

## 2012-01-31 DIAGNOSIS — Z954 Presence of other heart-valve replacement: Secondary | ICD-10-CM

## 2012-01-31 NOTE — Patient Instructions (Addendum)
The current medical regimen is effective;  continue present plan and medications.  Please wear knee high compression stockings. (RX given)  Follow up in 1 year with Dr Antoine Poche.  You will receive a letter in the mail 2 months before you are due.  Please call us when you receive this letter to schedule your follow up appointment.

## 2012-01-31 NOTE — Progress Notes (Signed)
HPI The patient presents for followup of aortic valve replacement. Last year he had an echo demonstrating well preserved ejection fraction and normal prosthetic valve function. Since that time he has done well from a cardiovascular standpoint though he has been somewhat limited in his activities because of joint pains. The patient denies any new symptoms such as chest discomfort, neck or arm discomfort. There has been no new shortness of breath, PND or orthopnea. There have been no reported palpitations, presyncope or syncope.  Allergies  Allergen Reactions  . Nsaids Swelling    LIPS AND NECK  . Penicillins Hives and Swelling    Current Outpatient Prescriptions  Medication Sig Dispense Refill  . allopurinol (ZYLOPRIM) 300 MG tablet Take 1 tablet by mouth Daily. AM      . Ascorbic Acid (VITAMIN C) 1000 MG tablet Take 1,000 mg by mouth daily.        Marland Kitchen aspirin 81 MG tablet Take 81 mg by mouth 2 (two) times daily.        . Calcium Carbonate-Vitamin D (CALCIUM + D PO) Take by mouth daily. 2 tabs daily      . Cetirizine HCl (ZYRTEC ALLERGY PO) Take 10 mg by mouth daily.        . furosemide (LASIX) 20 MG tablet Take 20 mg by mouth daily. AM      . glimepiride (AMARYL) 2 MG tablet Take 1 mg by mouth Daily. 1/2 tab in AM      . JANUMET 50-1000 MG per tablet Take 1 tablet by mouth Twice daily.      Marland Kitchen KLOR-CON 8 MEQ CR tablet Take 1 tablet by mouth Daily. AM      . multivitamin (THERAGRAN) per tablet Take 1 tablet by mouth daily.        Marland Kitchen NEXIUM 40 MG capsule Take 1 tablet by mouth daily. PM      . ramipril (ALTACE) 5 MG capsule Take 1 tablet by mouth Daily. PM        Past Medical History  Diagnosis Date  . Gastric ulcer     no current problems  . Obesity   . Seasonal allergies   . Gout   . GERD (gastroesophageal reflux disease)   . Sleep apnea     uses CPAP nightly  . Fatty liver   . Hypertension     under control; states med. is really to protect kidneys due to diabetes  . Diabetes  mellitus     NIDDM  . Dental crowns present   . 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 Hx of bacterial endocarditis 2005    Past Surgical History  Procedure Date  . Gastric bypass 03/2004  . Aortic valve replacement 11/2003  . Tonsillectomy and adenoidectomy   . Nasal sinus surgery   . Metatarsal osteotomy 03/20/2010    right 1st MT; gastroc soleus recession  . Knee arthroscopy 08/09/2007 - left    07/15/2003 - right  . Toe fusion 07/12/2005    left 3rd toe PIP and DIP fusion  . Tendon release 09/09/2011    Procedure: HEEL CORD LENGTHENING;  Surgeon: Toni Arthurs, MD;  Location: [facility];  Service: Orthopedics;  Laterality: Right;  Righ tachilles tendon lengthening    ROS:  As stated in the HPI and negative  for all other systems.  PHYSICAL EXAM BP 120/75  Pulse 88  Ht 6\' 5"  (1.956 m)  Wt 370 lb 1.9 oz (167.885 kg)  BMI 43.89 kg/m2 GENERAL:  Well appearing HEENT:  Pupils equal round and reactive, fundi not visualized, oral mucosa unremarkable NECK:  No jugular venous distention, waveform within normal limits, carotid upstroke brisk and symmetric, no bruits, no thyromegaly LYMPHATICS:  No cervical, inguinal adenopathy LUNGS:  Clear to auscultation bilaterally BACK:  No CVA tenderness CHEST:  Unremarkable HEART:  PMI not displaced or sustained,S1 and S2 within normal limits, no S3, no S4, no clicks, no rubs, no murmurs ABD:  Flat, positive bowel sounds normal in frequency in pitch, no bruits, no rebound, no guarding, no midline pulsatile mass, no hepatomegaly, no splenomegaly EXT:  2 plus pulses throughout, no edema, no cyanosis no clubbing SKIN:  No rashes no nodules NEURO:  Cranial nerves II through XII grossly intact, motor grossly intact throughout PSYCH:  Cognitively intact, oriented to person place and time   EKG:  Sinus  rhythm, rate 88, left axis deviation, no acute ST-T wave changes.  01/31/2012   ASSESSMENT AND PLAN  AVR - The patient has done well since I last saw him. He has had no new cardiovascular complaints. His echo demonstrated a normal functioning bioprosthetic valve last year. At this point I will not plan a repeat echo this year but I will see him again in followup and consider this in 2014 or sooner if he has symptoms.   DM -   He reports that his last hemoglobin A1c was 6.5. No change in therapy is indicated.

## 2012-02-01 ENCOUNTER — Encounter: Payer: Self-pay | Admitting: Cardiology

## 2012-02-02 ENCOUNTER — Telehealth: Payer: Self-pay | Admitting: Cardiology

## 2012-02-02 NOTE — Telephone Encounter (Signed)
Per pt call his ortho provider wants him to take celabrex 200 mg and he wants to make sure that is ok

## 2012-02-02 NOTE — Telephone Encounter (Signed)
Pt aware.  Reviewed to watch for s/s of GI bleed.  He states understanding.

## 2012-02-02 NOTE — Telephone Encounter (Signed)
Will forward to MD for review

## 2012-02-02 NOTE — Telephone Encounter (Signed)
There is no absolute contraindication in his situation.  He is at higher risk of GI complications as he takes ASA daily.

## 2012-10-24 ENCOUNTER — Telehealth: Payer: Self-pay | Admitting: Cardiology

## 2012-10-24 DIAGNOSIS — Z952 Presence of prosthetic heart valve: Secondary | ICD-10-CM

## 2012-10-24 DIAGNOSIS — I359 Nonrheumatic aortic valve disorder, unspecified: Secondary | ICD-10-CM

## 2012-10-24 NOTE — Telephone Encounter (Signed)
Pt not due for ROV until 10/14.  Wanting a 2 D echo prior to the office visit.  Will need to have Dr Antoine Poche review for need and will call pt back with recommendations.

## 2012-10-24 NOTE — Telephone Encounter (Signed)
New Problem:    Patient called in wanting to have an ECHO with his next OV.  Please call back.

## 2012-10-24 NOTE — Telephone Encounter (Signed)
He needs an echo prior to the appt for follow up of AVR.

## 2012-10-24 NOTE — Telephone Encounter (Signed)
Pt aware he is due in October and will be contacted then to schedule echo and 1 yr f/u

## 2013-01-02 ENCOUNTER — Ambulatory Visit (HOSPITAL_COMMUNITY): Payer: BC Managed Care – PPO | Attending: Cardiology | Admitting: Radiology

## 2013-01-02 ENCOUNTER — Encounter: Payer: Self-pay | Admitting: Cardiology

## 2013-01-02 ENCOUNTER — Ambulatory Visit (INDEPENDENT_AMBULATORY_CARE_PROVIDER_SITE_OTHER): Payer: BC Managed Care – PPO | Admitting: Cardiology

## 2013-01-02 VITALS — BP 108/76 | HR 76 | Ht 77.0 in | Wt 375.0 lb

## 2013-01-02 DIAGNOSIS — I359 Nonrheumatic aortic valve disorder, unspecified: Secondary | ICD-10-CM

## 2013-01-02 DIAGNOSIS — Z954 Presence of other heart-valve replacement: Secondary | ICD-10-CM

## 2013-01-02 DIAGNOSIS — Z952 Presence of prosthetic heart valve: Secondary | ICD-10-CM

## 2013-01-02 DIAGNOSIS — I10 Essential (primary) hypertension: Secondary | ICD-10-CM | POA: Insufficient documentation

## 2013-01-02 DIAGNOSIS — Z87891 Personal history of nicotine dependence: Secondary | ICD-10-CM | POA: Insufficient documentation

## 2013-01-02 DIAGNOSIS — E119 Type 2 diabetes mellitus without complications: Secondary | ICD-10-CM | POA: Insufficient documentation

## 2013-01-02 NOTE — Patient Instructions (Addendum)
The current medical regimen is effective;  continue present plan and medications.  Follow up in 1 year with Dr Hochrein.  You will receive a letter in the mail 2 months before you are due.  Please call us when you receive this letter to schedule your follow up appointment.  

## 2013-01-02 NOTE — Progress Notes (Signed)
Echocardiogram performed.  

## 2013-01-02 NOTE — Progress Notes (Signed)
HPI The patient presents for followup of aortic valve replacement. Since I last saw him he has done well. He does a lot of walking through airports. With this he denies any acute cardiovascular symptoms. He denies any shortness of breath, PND or orthopnea. He has no chest pressure, neck or arm discomfort. He has no palpitations, presyncope or syncope.  He unfortunately has not been able to lose weight. With all of his traveling he hasn't exercised as much as he would like him he hopes to get back into the  Allergies  Allergen Reactions  . Nsaids Swelling    LIPS AND NECK  . Penicillins Hives and Swelling    Current Outpatient Prescriptions  Medication Sig Dispense Refill  . Ascorbic Acid (VITAMIN C) 1000 MG tablet Take 1,000 mg by mouth daily.        Marland Kitchen aspirin 81 MG tablet Take 81 mg by mouth daily.       . Calcium Carbonate-Vitamin D (CALCIUM + D PO) Take by mouth daily. 2 tabs daily      . CELEBREX 200 MG capsule TAKE ONE TABLET DAILY IN THE MORNING      . Cetirizine HCl (ZYRTEC ALLERGY PO) Take 10 mg by mouth daily.        . furosemide (LASIX) 20 MG tablet Take 20 mg by mouth daily. AM      . glimepiride (AMARYL) 2 MG tablet Take 1 mg by mouth Daily. 1/2 tab in AM      . JANUMET 50-1000 MG per tablet Take 1 tablet by mouth Twice daily.      Marland Kitchen KLOR-CON 8 MEQ CR tablet Take 1 tablet by mouth Daily. AM      . NEXIUM 40 MG capsule Take 1 tablet by mouth daily. PM      . ramipril (ALTACE) 5 MG capsule Take 1 tablet by mouth Daily. PM       No current facility-administered medications for this visit.    Past Medical History  Diagnosis Date  . Gastric ulcer     no current problems  . Obesity   . Seasonal allergies   . Gout   . GERD (gastroesophageal reflux disease)   . Sleep apnea     uses CPAP nightly  . Fatty liver   . Hypertension     under control; states med. is really to protect kidneys due to diabetes  . Diabetes mellitus     NIDDM  . Dental crowns present   . 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 Hx of bacterial endocarditis 2005    Past Surgical History  Procedure Laterality Date  . Gastric bypass  03/2004  . Aortic valve replacement  11/2003  . Tonsillectomy and adenoidectomy    . Nasal sinus surgery    . Metatarsal osteotomy  03/20/2010    right 1st MT; gastroc soleus recession  . Knee arthroscopy  08/09/2007 - left    07/15/2003 - right  . Toe fusion  07/12/2005    left 3rd toe PIP and DIP fusion  . Tendon release  09/09/2011    Procedure: HEEL CORD LENGTHENING;  Surgeon: Toni Arthurs, MD;  Location: [facility];  Service: Orthopedics;  Laterality: Right;  Righ tachilles tendon lengthening    ROS:  As stated in the  HPI and negative for all other systems.  PHYSICAL EXAM BP 108/76  Pulse 76  Ht 6\' 5"  (1.956 m)  Wt 375 lb (170.099 kg)  BMI 44.46 kg/m2  SpO2 97% GENERAL:  Well appearing HEENT:  Pupils equal round and reactive, fundi not visualized, oral mucosa unremarkable NECK:  No jugular venous distention, waveform within normal limits, carotid upstroke brisk and symmetric, no bruits, no thyromegaly LYMPHATICS:  No cervical, inguinal adenopathy LUNGS:  Clear to auscultation bilaterally BACK:  No CVA tenderness CHEST: Well healed sternotomy scar. HEART:  PMI not displaced or sustained,S1 and S2 within normal limits, no S3, no S4, no clicks, no rubs, soft apical early peaking systolic murmur, no diastolicmurmurs ABD:  Flat, positive bowel sounds normal in frequency in pitch, no bruits, no rebound, no guarding, no midline pulsatile mass, no hepatomegaly, no splenomegaly, obese EXT:  2 plus pulses throughout, no edema, no cyanosis no clubbing SKIN:  No rashes no nodules NEURO:  Cranial nerves II through XII grossly intact, motor grossly intact throughout PSYCH:  Cognitively intact, oriented to  person place and time   EKG:  Sinus rhythm, rate 76, left axis deviation, no acute ST-T wave changes.  01/02/2013   ASSESSMENT AND PLAN  AVR - The patient has done well since I last saw him. He has had no new cardiovascular complaints. His echo demonstrated a normal functioning bioprosthetic valve last year. At this point I will not plan a repeat echo this year but I will see him again in followup and consider this in 2014 or sooner if he has symptoms.  OBESITY -  We have discussed weight loss strategies.  RISK REDUCTION - We discussed the risks and benefits of aspirin. He has had no known coronary disease. Given this and the ongoing Celebrex use I think the benefits of aspirin become less and the risk higher and so he will discontinue this.

## 2013-04-13 ENCOUNTER — Emergency Department (HOSPITAL_BASED_OUTPATIENT_CLINIC_OR_DEPARTMENT_OTHER)
Admission: EM | Admit: 2013-04-13 | Discharge: 2013-04-13 | Disposition: A | Discharge status: Home / Self Care | Payer: Managed Care, Other (non HMO) | Attending: Emergency Medicine | Admitting: Emergency Medicine

## 2013-04-13 ENCOUNTER — Encounter (HOSPITAL_BASED_OUTPATIENT_CLINIC_OR_DEPARTMENT_OTHER): Payer: Self-pay | Admitting: Emergency Medicine

## 2013-04-13 DIAGNOSIS — Z880 Allergy status to penicillin: Secondary | ICD-10-CM | POA: Insufficient documentation

## 2013-04-13 DIAGNOSIS — Z87768 Personal history of other specified (corrected) congenital malformations of integument, limbs and musculoskeletal system: Secondary | ICD-10-CM | POA: Insufficient documentation

## 2013-04-13 DIAGNOSIS — Z954 Presence of other heart-valve replacement: Secondary | ICD-10-CM | POA: Insufficient documentation

## 2013-04-13 DIAGNOSIS — Z8669 Personal history of other diseases of the nervous system and sense organs: Secondary | ICD-10-CM | POA: Insufficient documentation

## 2013-04-13 DIAGNOSIS — J069 Acute upper respiratory infection, unspecified: Secondary | ICD-10-CM | POA: Insufficient documentation

## 2013-04-13 DIAGNOSIS — I10 Essential (primary) hypertension: Secondary | ICD-10-CM | POA: Insufficient documentation

## 2013-04-13 DIAGNOSIS — Z7982 Long term (current) use of aspirin: Secondary | ICD-10-CM | POA: Insufficient documentation

## 2013-04-13 DIAGNOSIS — E1169 Type 2 diabetes mellitus with other specified complication: Secondary | ICD-10-CM | POA: Insufficient documentation

## 2013-04-13 DIAGNOSIS — K219 Gastro-esophageal reflux disease without esophagitis: Secondary | ICD-10-CM | POA: Insufficient documentation

## 2013-04-13 DIAGNOSIS — Z9884 Bariatric surgery status: Secondary | ICD-10-CM | POA: Insufficient documentation

## 2013-04-13 DIAGNOSIS — Z98811 Dental restoration status: Secondary | ICD-10-CM | POA: Insufficient documentation

## 2013-04-13 DIAGNOSIS — G4730 Sleep apnea, unspecified: Secondary | ICD-10-CM | POA: Insufficient documentation

## 2013-04-13 DIAGNOSIS — Z87891 Personal history of nicotine dependence: Secondary | ICD-10-CM | POA: Insufficient documentation

## 2013-04-13 DIAGNOSIS — Z8776 Personal history of (corrected) congenital malformations of integument, limbs and musculoskeletal system: Secondary | ICD-10-CM | POA: Insufficient documentation

## 2013-04-13 DIAGNOSIS — L97509 Non-pressure chronic ulcer of other part of unspecified foot with unspecified severity: Secondary | ICD-10-CM | POA: Insufficient documentation

## 2013-04-13 DIAGNOSIS — Z8639 Personal history of other endocrine, nutritional and metabolic disease: Secondary | ICD-10-CM | POA: Insufficient documentation

## 2013-04-13 DIAGNOSIS — Z862 Personal history of diseases of the blood and blood-forming organs and certain disorders involving the immune mechanism: Secondary | ICD-10-CM | POA: Insufficient documentation

## 2013-04-13 DIAGNOSIS — Z79899 Other long term (current) drug therapy: Secondary | ICD-10-CM | POA: Insufficient documentation

## 2013-04-13 NOTE — ED Notes (Signed)
Pt c/o Uri symptoms x 4 days

## 2013-04-13 NOTE — ED Provider Notes (Signed)
CSN: [id number]     Arrival date & time 04/13/13  1316 History   First MD Initiated Contact with Patient 04/13/13 1540     Chief Complaint  Patient presents with  . URI   (Consider location/radiation/quality/duration/timing/severity/associated sxs/prior Treatment) HPI Few days nonproductive cough clear runny nose minimal sore throat no fever no body aches no vomiting no diarrhea no rash no abdominal pain no shortness of breath no confusion no severe headache no stiff neck no treatment prior to arrival   Past Medical History  Diagnosis Date  . Gastric ulcer     no current problems  . Obesity   . Seasonal allergies   . Gout   . GERD (gastroesophageal reflux disease)   . Sleep apnea     uses CPAP nightly  . Fatty liver   . Hypertension     under control; states med. is really to protect kidneys due to diabetes  . Diabetes mellitus     NIDDM  . Dental crowns present   . 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 Hx of bacterial endocarditis 2005   Past Surgical History  Procedure Laterality Date  . Gastric bypass  03/2004  . Aortic valve replacement  11/2003  . Tonsillectomy and adenoidectomy    . Nasal sinus surgery    . Metatarsal osteotomy  03/20/2010    right 1st MT; gastroc soleus recession  . Knee arthroscopy  08/09/2007 - left    07/15/2003 - right  . Toe fusion  07/12/2005    left 3rd toe PIP and DIP fusion  . Tendon release  09/09/2011    Procedure: HEEL CORD LENGTHENING;  Surgeon: Toni Arthurs, MD;  Location: [facility];  Service: Orthopedics;  Laterality: Right;  Righ tachilles tendon lengthening   Family History  Problem Relation Age of Onset  . Colon cancer      family history   History  Substance Use Topics  . Smoking status: Former Smoker    Types: Cigars    Quit date: 01/31/2011  . Smokeless tobacco: Never Used      Comment: smokes 1-2 cigars/week  . Alcohol Use: Yes     Comment: occasionally    Review of Systems 10 Systems reviewed and are negative for acute change except as noted in the HPI. Allergies  Nsaids and Penicillins  Home Medications   Current Outpatient Rx  Name  Route  Sig  Dispense  Refill  . Ascorbic Acid (VITAMIN C) 1000 MG tablet   Oral   Take 1,000 mg by mouth daily.           Marland Kitchen aspirin 81 MG tablet   Oral   Take 81 mg by mouth daily.          . Calcium Carbonate-Vitamin D (CALCIUM + D PO)   Oral   Take by mouth daily. 2 tabs daily         . CELEBREX 200 MG capsule      TAKE ONE TABLET DAILY IN THE MORNING         . Cetirizine HCl (ZYRTEC ALLERGY PO)   Oral   Take 10 mg by mouth daily.           . furosemide (LASIX) 20 MG tablet   Oral   Take 20 mg by  mouth daily. AM         . glimepiride (AMARYL) 2 MG tablet   Oral   Take 1 mg by mouth Daily. 1/2 tab in AM         . JANUMET 50-1000 MG per tablet   Oral   Take 1 tablet by mouth Twice daily.         Marland Kitchen KLOR-CON 8 MEQ CR tablet   Oral   Take 1 tablet by mouth Daily. AM         . NEXIUM 40 MG capsule   Oral   Take 1 tablet by mouth daily. PM         . ramipril (ALTACE) 5 MG capsule   Oral   Take 1 tablet by mouth Daily. PM          BP 146/88  Pulse 79  Temp(Src) 98 F (36.7 C) (Oral)  Resp 16  Ht 6\' 5"  (1.956 m)  Wt 370 lb (167.831 kg)  BMI 43.87 kg/m2  SpO2 98% Physical Exam  Nursing note and vitals reviewed. Constitutional:  Awake, alert, nontoxic appearance.  HENT:  Head: Atraumatic.  Mouth/Throat: Oropharynx is clear and moist. No oropharyngeal exudate.  TMs clear bilaterally  Eyes: Right eye exhibits no discharge. Left eye exhibits no discharge.  Neck: Neck supple.  Cardiovascular: Normal rate and regular rhythm.   No murmur heard. Pulmonary/Chest: Effort normal and breath sounds normal. No respiratory distress. He has no wheezes. He has no rales. He  exhibits no tenderness.  Abdominal: Soft. Bowel sounds are normal. He exhibits no distension and no mass. There is no tenderness. There is no rebound and no guarding.  Musculoskeletal: He exhibits no tenderness.  Baseline ROM, no obvious new focal weakness.  Neurological:  Mental status and motor strength appears baseline for patient and situation.  Skin: No rash noted.  Psychiatric: He has a normal mood and affect.    ED Course  Procedures (including critical care time) Patient / Family / Caregiver informed of clinical course, understand medical decision-making process, and agree with plan. Labs Review Labs Reviewed - No data to display Imaging Review No results found.  EKG Interpretation   None       MDM   1. URI (upper respiratory infection)    I doubt any other EMC precluding discharge at this time including, but not necessarily limited to the following:SBI.    Hurman Horn, MD 04/14/13 [phone]

## 2013-10-04 DIAGNOSIS — K760 Fatty (change of) liver, not elsewhere classified: Secondary | ICD-10-CM | POA: Insufficient documentation

## 2013-10-04 DIAGNOSIS — I059 Rheumatic mitral valve disease, unspecified: Secondary | ICD-10-CM | POA: Insufficient documentation

## 2013-10-04 DIAGNOSIS — E1165 Type 2 diabetes mellitus with hyperglycemia: Secondary | ICD-10-CM | POA: Insufficient documentation

## 2013-10-04 DIAGNOSIS — G4700 Insomnia, unspecified: Secondary | ICD-10-CM | POA: Insufficient documentation

## 2013-10-04 DIAGNOSIS — I509 Heart failure, unspecified: Secondary | ICD-10-CM | POA: Insufficient documentation

## 2013-10-04 DIAGNOSIS — M109 Gout, unspecified: Secondary | ICD-10-CM | POA: Insufficient documentation

## 2013-10-04 DIAGNOSIS — K259 Gastric ulcer, unspecified as acute or chronic, without hemorrhage or perforation: Secondary | ICD-10-CM | POA: Insufficient documentation

## 2013-10-04 DIAGNOSIS — K219 Gastro-esophageal reflux disease without esophagitis: Secondary | ICD-10-CM | POA: Insufficient documentation

## 2013-10-04 DIAGNOSIS — D696 Thrombocytopenia, unspecified: Secondary | ICD-10-CM | POA: Insufficient documentation

## 2013-11-26 ENCOUNTER — Telehealth: Payer: Self-pay | Admitting: Internal Medicine

## 2013-11-26 NOTE — Telephone Encounter (Signed)
Pt scheduled to see Amy Esterwood PA 11/28/13@3 :30pm. Pt aware of appt.

## 2013-11-28 ENCOUNTER — Other Ambulatory Visit: Payer: Managed Care, Other (non HMO)

## 2013-11-28 ENCOUNTER — Ambulatory Visit (INDEPENDENT_AMBULATORY_CARE_PROVIDER_SITE_OTHER): Payer: Managed Care, Other (non HMO) | Admitting: Physician Assistant

## 2013-11-28 ENCOUNTER — Ambulatory Visit: Payer: BC Managed Care – PPO | Admitting: Physician Assistant

## 2013-11-28 ENCOUNTER — Encounter: Payer: Self-pay | Admitting: Physician Assistant

## 2013-11-28 VITALS — BP 96/72 | HR 72 | Ht 77.0 in | Wt 364.0 lb

## 2013-11-28 DIAGNOSIS — R197 Diarrhea, unspecified: Secondary | ICD-10-CM

## 2013-11-28 DIAGNOSIS — Z9884 Bariatric surgery status: Secondary | ICD-10-CM

## 2013-11-28 MED ORDER — DIPHENOXYLATE-ATROPINE 2.5-0.025 MG PO TABS: ORAL_TABLET | ORAL | Status: DC

## 2013-11-28 MED ORDER — DICYCLOMINE HCL 20 MG PO TABS: ORAL_TABLET | ORAL | Status: DC

## 2013-11-28 NOTE — Progress Notes (Signed)
Subjective:    Patient ID: Philip [name], male    DOB: [birth year], 53 y.o.   MRN: [id number]  HPI Philip [name] is a pleasant 53 year old white male known to Dr. Yancey Flemings from previous colonoscopy. He has had a couple of colonoscopies in the past due to family history of colon cancer in a grandparent. His last colonoscopy was done in October of 2011 and was normal. At that point he was advised to have ten-year interval followups. Patient has history of adult-onset diabetes mellitus, hypertension, morbid obesity for which she is status post after bypass proximal he 9 years ago. 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 He has done well post gastric bypass. He also has history of aortic valve replacement 10 years ago and prior to that had endocarditis. He comes in today for evaluation of acute diarrheal illness onset about one half weeks ago. He had been to [place]  for his son's wedding and says he felt fine while he was there, but the day he returned he developed profuse explosive watery diarrhea which was nonbloody. He says for the first few days he had 6-10 bowel movements per day and had sharp lower abdominal   pains. He did not have any associated nausea ,vomiting, fever or chills. He actually had a business trip to [facility] that same week and went to an urgent care they are. He had labs which she was told were unremarkable and was started empirically on a course of Cipro 500 twice a day and Flagyl 500 twice a day x10 days as well as Lomotil and Bentyl. He has not been not any recent antibiotics no other new medications no known infectious exposures and nobody else became ill that was on that same trip. He says that he feels fine but his diarrhea is persisting. He continues to have 3-4 rather explosive diarrheal stools per day despite taking Lomotil twice a day and Bentyl twice a day and being on the Cipro and Flagyl over the past 7 days.    Review of Systems  Constitutional: Negative.   HENT: Negative.     Eyes: Negative.   Respiratory: Negative.   Cardiovascular: Negative.   Gastrointestinal: Positive for diarrhea.  Endocrine: Negative.   Musculoskeletal: Negative.   Allergic/Immunologic: Negative.   Neurological: Negative.   Hematological: Negative.   Psychiatric/Behavioral: Negative.    Outpatient Prescriptions Prior to Visit  Medication Sig Dispense Refill  . Ascorbic Acid (VITAMIN C) 1000 MG tablet Take 1,000 mg by mouth daily.        . Calcium Carbonate-Vitamin D (CALCIUM + D PO) Take by mouth daily. 2 tabs daily      . CELEBREX 200 MG capsule TAKE ONE TABLET DAILY IN THE MORNING      . Cetirizine HCl (ZYRTEC ALLERGY PO) Take 10 mg by mouth daily.        . furosemide (LASIX) 20 MG tablet Take 20 mg by mouth daily. AM      . JANUMET 50-1000 MG per tablet Take 1 tablet by mouth Twice daily.      Marland Kitchen KLOR-CON 8 MEQ CR tablet Take 1 tablet by mouth Daily. AM      . NEXIUM 40 MG capsule Take 1 tablet by mouth daily. PM      . ramipril (ALTACE) 5 MG capsule Take 1 tablet by mouth Daily. PM      . aspirin 81 MG tablet Take 81 mg by mouth daily.       Marland Kitchen  glimepiride (AMARYL) 2 MG tablet Take 1 mg by mouth Daily. 1/2 tab in AM       No facility-administered medications prior to visit.   Allergies  Allergen Reactions  . Nsaids Swelling    LIPS AND NECK  . Penicillins Hives and Swelling   Patient Active Problem List   Diagnosis Date Noted  . 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 S/P AVR (aortic valve replacement) 01/15/2011  . Overweight 01/15/2011  . DM 08/17/2007  . ESOPHAGEAL ULCER, WITH BLEEDING 08/17/2007  . OTHER CHRONIC NONALCOHOLIC LIVER DISEASE 08/17/2007  . Calculus of GB w/o Cystitis/Obst 08/17/2007  . GASTRIC ULCER, ACUTE, HEMORRHAGE 07/08/2004   History  Substance Use Topics  . Smoking status: Former Smoker    Types: Cigars    Quit date: 01/31/2011  . Smokeless tobacco: Never Used     Comment: smokes 1-2 cigars/week  . Alcohol Use: Yes     Comment: occasionally    family history includes Colon cancer in an other family member.     Objective:   Physical Exam    well-developed large white male in no acute distress, pleasant blood pressure 96/72 pulse 72 height 6 foot 5 weight 364, BMI 43. HEENT; nontraumatic normocephalic EOMI PERRLA sclera anicteric, Supple ;no JVD, Cardiovascular ;regular rate and rhythm with S1-S2 no partial murmur or gallop, Pulmonary; clear bilaterally, Abdomen; obese soft no focal tenderness bowel sounds are active liver is palpable at the right costal margin, Rectal; exam not done, Extremities; no clubbing cyanosis or edema skin warm dry, Psych; mood and affect appropriate        Assessment & Plan:   #201  [age] year old male with acute diarrheal illness x10 days. Onset after a trip to [place] Patient has been treated with an empiric course of Cipro and Flagyl which he is completing but continues to have diarrhea though not as severe as at onset. This is very likely an infectious colitis will need to rule out C. difficile and also consider parasitic infection . #2 status post gastric bypass #3 morbid obesity #4 status post aortic valve replacement/tissue valve  Plan; Patient will complete the current course of Cipro and Flagyl, has 2 more days Continue Lomotil twice daily and Bentyl 10 mg by mouth twice daily Obtain GI pathogen panel and stool for O&P Further plans pending results of above

## 2013-11-28 NOTE — Patient Instructions (Addendum)
Please go to the basement level lab for stool test. We will call you with the results.  Finish the cipro and Flagyl medications. We sent refills to [facility] [address]. 1. Dicyclomine( Bentyl) 2. Lomotil tablets for diarrhea

## 2013-11-29 LAB — GASTROINTESTINAL PATHOGEN PANEL PCR
C. DIFFICILE TOX A/B, PCR: NEGATIVE
CAMPYLOBACTER, PCR: NEGATIVE
CRYPTOSPORIDIUM, PCR: NEGATIVE
E coli (ETEC) LT/ST PCR: NEGATIVE
E coli (STEC) stx1/stx2, PCR: NEGATIVE
E coli 0157, PCR: NEGATIVE
GIARDIA LAMBLIA, PCR: NEGATIVE
Norovirus, PCR: NEGATIVE
ROTAVIRUS, PCR: NEGATIVE
Salmonella, PCR: NEGATIVE
Shigella, PCR: NEGATIVE

## 2013-11-30 LAB — OVA AND PARASITE EXAMINATION: OP: NONE SEEN

## 2013-12-04 NOTE — Progress Notes (Signed)
Agree with initial assessment and plans as outlined 

## 2013-12-05 ENCOUNTER — Telehealth: Payer: Self-pay | Admitting: Physician Assistant

## 2013-12-05 NOTE — Telephone Encounter (Signed)
Patient is requesting results from stool studies. Amy Esterwood, PA not available for reviewing. Primary GI  Dr. Marina GoodellPerry. Please, advise.

## 2013-12-06 NOTE — Telephone Encounter (Signed)
Linda took care of this.

## 2014-01-04 ENCOUNTER — Encounter: Payer: Self-pay | Admitting: Cardiology

## 2014-01-04 ENCOUNTER — Ambulatory Visit (INDEPENDENT_AMBULATORY_CARE_PROVIDER_SITE_OTHER): Payer: Managed Care, Other (non HMO) | Admitting: Cardiology

## 2014-01-04 VITALS — BP 120/84 | HR 77 | Ht 77.0 in | Wt 358.7 lb

## 2014-01-04 DIAGNOSIS — Z952 Presence of prosthetic heart valve: Secondary | ICD-10-CM

## 2014-01-04 DIAGNOSIS — Z954 Presence of other heart-valve replacement: Secondary | ICD-10-CM

## 2014-01-04 NOTE — Progress Notes (Signed)
HPI The patient presents for followup of bioprosthetic aortic valve replacement in 2005.  The last echo in 2014 demonstrated a normal AVR.   Since I last saw him he has done well.  The patient denies any new symptoms such as chest discomfort, neck or arm discomfort. There has been no new shortness of breath, PND or orthopnea. There have been no reported palpitations, presyncope or syncope.    Allergies  Allergen Reactions  . Nsaids Swelling    LIPS AND NECK  . Penicillins Hives and Swelling    Current Outpatient Prescriptions  Medication Sig Dispense Refill  . Ascorbic Acid (VITAMIN C) 1000 MG tablet Take 1,000 mg by mouth daily.        . Calcium Carbonate-Vitamin D (CALCIUM + D PO) Take by mouth daily. 2 tabs daily      . CELEBREX 200 MG capsule TAKE ONE TABLET DAILY IN THE MORNING      . Cetirizine HCl (ZYRTEC ALLERGY PO) Take 10 mg by mouth daily.        . diphenoxylate-atropine (LOMOTIL) 2.5-0.025 MG per tablet Take 1 tab 2-3 times daily.  90 tablet  0  . furosemide (LASIX) 20 MG tablet Take 20 mg by mouth daily. AM      . JANUMET 50-1000 MG per tablet Take 1 tablet by mouth Twice daily.      Marland Kitchen KLOR-CON 8 MEQ CR tablet Take 1 tablet by mouth Daily. AM      . metroNIDAZOLE (FLAGYL) 500 MG tablet       . NEXIUM 40 MG capsule Take 1 tablet by mouth daily. PM      . ramipril (ALTACE) 5 MG capsule Take 1 tablet by mouth Daily. PM       No current facility-administered medications for this visit.    Past Medical History  Diagnosis Date  . Gastric ulcer     no current problems  . Obesity   . Seasonal allergies   . Gout   . GERD (gastroesophageal reflux disease)   . Sleep apnea     uses CPAP nightly  . Fatty liver   . Hypertension     under control; states med. is really to protect kidneys due to diabetes  . Diabetes mellitus     NIDDM  . Dental crowns present   . 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 Tightness of  heel cord, right 08/2011  . 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 Hx of bacterial endocarditis 2005    Past Surgical History  Procedure Laterality Date  . Gastric bypass  03/2004  . Aortic valve replacement  11/2003  . Tonsillectomy and adenoidectomy    . Nasal sinus surgery    . Metatarsal osteotomy  03/20/2010    right 1st MT; gastroc soleus recession  . Knee arthroscopy  08/09/2007 - left    07/15/2003 - right  . Toe fusion  07/12/2005    left 3rd toe PIP and DIP fusion  . Tendon release  09/09/2011    Procedure: HEEL CORD LENGTHENING;  Surgeon: Toni Arthurs, MD;  Location: [facility];  Service: Orthopedics;  Laterality: Right;  Righ tachilles tendon lengthening    ROS:  As stated in the HPI and negative for all other systems.  PHYSICAL EXAM BP 120/84  Pulse 77  Ht  (1.956 m)  Wt 358 lb 11.2 oz (  162.705 kg)  BMI 42.53 kg/m2 GENERAL:  Well appearing HEENT:  Pupils equal round and reactive, fundi not visualized, oral mucosa unremarkable NECK:  No jugular venous distention, waveform within normal limits, carotid upstroke brisk and symmetric, no bruits, no thyromegaly LYMPHATICS:  No cervical, inguinal adenopathy LUNGS:  Clear to auscultation bilaterally BACK:  No CVA tenderness CHEST: Well healed sternotomy scar. HEART:  PMI not displaced or sustained,S1 and S2 within normal limits, no S3, no S4, no clicks, no rubs, soft apical early peaking systolic murmur, no diastolicmurmurs ABD:  Flat, positive bowel sounds normal in frequency in pitch, no bruits, no rebound, no guarding, no midline pulsatile mass, no hepatomegaly, no splenomegaly, obese EXT:  2 plus pulses throughout, no edema, no cyanosis no clubbing SKIN:  No rashes no nodules NEURO:  Cranial nerves II through XII grossly intact, motor grossly intact throughout PSYCH:  Cognitively intact, oriented to person place and time   EKG:  Sinus rhythm, rate 77, left axis deviation, no acute ST-T wave changes,  nonspecific ST T wave changes.  01/04/2014   ASSESSMENT AND PLAN  AVR - The patient has done well since I last saw him.  There is no change in history or exam.  He will have follow up in one year and I will repeat an echo before that appt.   OBESITY -  He has maintained his weight.   We have discussed this in the past.

## 2014-01-04 NOTE — Patient Instructions (Signed)
Your physician recommends that you schedule a follow-up appointment in: one year with Dr. Hochrein  

## 2014-10-02 ENCOUNTER — Telehealth: Payer: Self-pay | Admitting: Cardiology

## 2014-10-02 NOTE — Telephone Encounter (Signed)
Closed encounter °

## 2014-12-18 ENCOUNTER — Telehealth: Payer: Self-pay | Admitting: *Deleted

## 2014-12-18 NOTE — Telephone Encounter (Signed)
Requesting surgical clearance:   1. Type of surgery: Knee Replacement  2. Surgeon: Dr Ramos/Dr Penni Bombard  3. Surgical date: Pending  4. Medications that need to be help: Aspirin  Pt saw you 01/04/14, pt is schedule to have an Echo in September, Is pt cleared for surgery?

## 2014-12-19 NOTE — Telephone Encounter (Signed)
Since I have an upcoming appt with him I would like to see him first before commenting on his risk.

## 2014-12-20 NOTE — Telephone Encounter (Signed)
Pt have appointment with Dr Antoine Poche 09/14, he will going over if pt is cleared for surgery.

## 2014-12-30 ENCOUNTER — Other Ambulatory Visit: Payer: Self-pay

## 2014-12-30 ENCOUNTER — Ambulatory Visit (HOSPITAL_COMMUNITY): Payer: Managed Care, Other (non HMO) | Attending: Cardiovascular Disease

## 2014-12-30 ENCOUNTER — Other Ambulatory Visit: Payer: Self-pay | Admitting: Cardiology

## 2014-12-30 DIAGNOSIS — Z954 Presence of other heart-valve replacement: Secondary | ICD-10-CM

## 2014-12-30 DIAGNOSIS — Z952 Presence of prosthetic heart valve: Secondary | ICD-10-CM

## 2014-12-30 DIAGNOSIS — I517 Cardiomegaly: Secondary | ICD-10-CM | POA: Insufficient documentation

## 2014-12-30 DIAGNOSIS — E669 Obesity, unspecified: Secondary | ICD-10-CM | POA: Insufficient documentation

## 2014-12-30 DIAGNOSIS — Z87891 Personal history of nicotine dependence: Secondary | ICD-10-CM | POA: Insufficient documentation

## 2014-12-30 DIAGNOSIS — E119 Type 2 diabetes mellitus without complications: Secondary | ICD-10-CM | POA: Insufficient documentation

## 2014-12-30 DIAGNOSIS — I059 Rheumatic mitral valve disease, unspecified: Secondary | ICD-10-CM | POA: Diagnosis not present

## 2014-12-30 DIAGNOSIS — I358 Other nonrheumatic aortic valve disorders: Secondary | ICD-10-CM | POA: Insufficient documentation

## 2014-12-30 DIAGNOSIS — Z6841 Body Mass Index (BMI) 40.0 and over, adult: Secondary | ICD-10-CM | POA: Insufficient documentation

## 2014-12-30 DIAGNOSIS — I10 Essential (primary) hypertension: Secondary | ICD-10-CM | POA: Diagnosis not present

## 2015-01-01 ENCOUNTER — Ambulatory Visit: Payer: Managed Care, Other (non HMO) | Admitting: Cardiology

## 2015-01-01 ENCOUNTER — Ambulatory Visit (INDEPENDENT_AMBULATORY_CARE_PROVIDER_SITE_OTHER): Payer: Managed Care, Other (non HMO) | Admitting: Cardiology

## 2015-01-01 ENCOUNTER — Encounter: Payer: Self-pay | Admitting: Cardiology

## 2015-01-01 VITALS — BP 124/80 | HR 79 | Ht 77.0 in | Wt 372.9 lb

## 2015-01-01 DIAGNOSIS — Z954 Presence of other heart-valve replacement: Secondary | ICD-10-CM | POA: Diagnosis not present

## 2015-01-01 DIAGNOSIS — Z952 Presence of prosthetic heart valve: Secondary | ICD-10-CM

## 2015-01-01 NOTE — Patient Instructions (Signed)
Your physician wants you to follow-up in: 1 Year. You will receive a reminder letter in the mail two months in advance. If you don't receive a letter, please call our office to schedule the follow-up appointment.  

## 2015-01-01 NOTE — Progress Notes (Signed)
HPI The patient presents for followup of bioprosthetic aortic valve replacement in 2005.  He had a follow up echo this month that demonstrated a normal AVR.   Since I last saw him he has done well.  The patient denies any new symptoms such as chest discomfort, neck or arm discomfort. There has been no new shortness of breath, PND or orthopnea. There have been no reported palpitations, presyncope or syncope.  He has knee pain but can still walk through airports without significant limitations. He is going to have TKR.   Allergies  Allergen Reactions  . Nsaids Swelling    LIPS AND NECK  . Penicillins Hives and Swelling    Current Outpatient Prescriptions  Medication Sig Dispense Refill  . Ascorbic Acid (VITAMIN C) 1000 MG tablet Take 1,000 mg by mouth daily.      Marland Kitchen aspirin 81 MG tablet Take 81 mg by mouth daily.    . Calcium Carbonate-Vitamin D (CALCIUM + D PO) Take by mouth daily. 2 tabs daily    . CELEBREX 200 MG capsule TAKE ONE TABLET DAILY IN THE MORNING    . Cetirizine HCl (ZYRTEC ALLERGY PO) Take 10 mg by mouth daily.      . furosemide (LASIX) 40 MG tablet Take 40 mg by mouth daily.    Marland Kitchen glucosamine-chondroitin 500-400 MG tablet Take 1 tablet by mouth daily.    Marland Kitchen JANUMET 50-1000 MG per tablet Take 1 tablet by mouth Twice daily.    Marland Kitchen KLOR-CON 8 MEQ CR tablet Take 1 tablet by mouth Daily. AM    . Melatonin 3 MG CAPS Take 1 capsule by mouth daily.    . metolazone (ZAROXOLYN) 5 MG tablet Take 5 mg by mouth once a week.    Marland Kitchen NEXIUM 40 MG capsule Take 1 tablet by mouth daily. PM    . ramipril (ALTACE) 5 MG capsule Take 1 tablet by mouth Daily. PM    . simvastatin (ZOCOR) 10 MG tablet Take 10 mg by mouth daily.     No current facility-administered medications for this visit.    Past Medical History  Diagnosis Date  . Gastric ulcer     no current problems  . Obesity   . Seasonal allergies   . Gout   . GERD (gastroesophageal reflux disease)   . Sleep apnea     uses CPAP  nightly  . Fatty liver   . Hypertension     under control; states med. is really to protect kidneys due to diabetes  . Diabetes mellitus     NIDDM  . Dental crowns present   . 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 Hx of bacterial endocarditis 2005    Past Surgical History  Procedure Laterality Date  . Gastric bypass  03/2004  . Aortic valve replacement  11/2003  . Tonsillectomy and adenoidectomy    . Nasal sinus surgery    . Metatarsal osteotomy  03/20/2010    right 1st MT; gastroc soleus recession  . Knee arthroscopy  08/09/2007 - left    07/15/2003 - right  . Toe fusion  07/12/2005    left 3rd toe PIP and DIP fusion  . Tendon release  09/09/2011    Procedure: HEEL CORD LENGTHENING;  Surgeon: Toni Arthurs, MD;  Location: [facility];  Service: Orthopedics;  Laterality: Right;  Righ tachilles tendon lengthening    ROS:  As stated in the HPI and negative for all other systems.  PHYSICAL EXAM BP 124/80 mmHg  Pulse 79  Ht 6\' 5"  (1.956 m)  Wt 372 lb 14.4 oz (169.146 kg)  BMI 44.21 kg/m2 GENERAL:  Well appearing NECK:  No jugular venous distention, waveform within normal limits, carotid upstroke brisk and symmetric, no bruits, no thyromegalyy LUNGS:  Clear to auscultation bilaterally CHEST: Well healed sternotomy scar. HEART:  PMI not displaced or sustained,S1 and S2 within normal limits, no S3, no S4, no clicks, no rubs, soft apical early peaking systolic murmur, no diastolicmurmurs ABD:  Flat, positive bowel sounds normal in frequency in pitch, no bruits, no rebound, no guarding, no midline pulsatile mass, no hepatomegaly, no splenomegaly, obese EXT:  2 plus pulses throughout, mild leg edema, no cyanosis no clubbing   EKG:  Sinus rhythm, rate 79, left axis deviation, no acute ST-T wave changes, nonspecific ST T wave changes.   01/01/2015   ASSESSMENT AND PLAN  AVR - The patient has done well since I last saw him and has a stable echo.  There is no change in history or exam.  No further testing is indicated. Marland Kitchen   PREOP -  The patient is at acceptable risk for TKR.  No further testing is indicated.  He has a high functional level. He has no high-risk findings. He is not going for a surgery that is high risk from a cardiovascular standpoint  HTN - The blood pressure is at target. No change in medications is indicated. We will continue with therapeutic lifestyle changes (TLC).

## 2015-01-10 ENCOUNTER — Ambulatory Visit: Payer: Managed Care, Other (non HMO) | Admitting: Cardiology

## 2015-01-22 ENCOUNTER — Telehealth: Payer: Self-pay | Admitting: *Deleted

## 2015-01-22 NOTE — Telephone Encounter (Signed)
Dr Antoine Poche office note for Surgical clearance was faxed to [facility] Faxed # [phone] Phone# [phone]

## 2015-02-19 NOTE — H&P (Signed)
TOTAL KNEE ADMISSION H&P  Patient is being admitted for left total knee arthroplasty.  Subjective:  Chief Complaint:    Left knee primary OA / pain  HPI: Philip [name], 54 y.o. male, has a history of pain and functional disability in the left knee due to arthritis and has failed non-surgical conservative treatments for greater than 12 weeks to include NSAID's and/or analgesics, corticosteriod injections, viscosupplementation injections and activity modification.  Onset of symptoms was gradual, starting years ago with gradually worsening course since that time. The patient noted prior procedures on the knee to include  arthroscopy on the left knee(s).  Patient currently rates pain in the left knee(s) at 8 out of 10 with activity. Patient has worsening of pain with activity and weight bearing, pain that interferes with activities of daily living, pain with passive range of motion, crepitus and joint swelling.  Patient has evidence of periarticular osteophytes and joint space narrowing by imaging studies.  There is no active infection.   Risks, benefits and expectations were discussed with the patient.  Risks including but not limited to the risk of anesthesia, blood clots, nerve damage, blood vessel damage, failure of the prosthesis, infection and up to and including death.  Patient understand the risks, benefits and expectations and wishes to proceed with surgery.   PCP: [name]., MD  D/C Plans:      Home with HHPT  Post-op Meds:       No Rx given   Tranexamic Acid:      To be given - IV   Decadron:      Is to be given  FYI:     ASA post-op  Norco post-op  CPAP    Patient Active Problem List   Diagnosis Date Noted  . 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 S/P AVR (aortic valve replacement) 01/15/2011  . Overweight(278.02) 01/15/2011  . DM 08/17/2007  . ESOPHAGEAL ULCER, WITH BLEEDING 08/17/2007  . OTHER CHRONIC NONALCOHOLIC LIVER DISEASE 08/17/2007  . Calculus of gallbladder without  mention of cholecystitis or obstruction 08/17/2007  . GASTRIC ULCER, ACUTE, HEMORRHAGE 07/08/2004   Past Medical History  Diagnosis Date  . Gastric ulcer     no current problems  . Obesity   . Seasonal allergies   . Gout   . GERD (gastroesophageal reflux disease)   . Sleep apnea     uses CPAP nightly  . Fatty liver   . Hypertension     under control; states med. is really to protect kidneys due to diabetes  . Diabetes mellitus     NIDDM  . Dental crowns present   . 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 Hx of bacterial endocarditis 2005    Past Surgical History  Procedure Laterality Date  . Gastric bypass  03/2004  . Aortic valve replacement  11/2003  . Tonsillectomy and adenoidectomy    . Nasal sinus surgery    . Metatarsal osteotomy  03/20/2010    right 1st MT; gastroc soleus recession  . Knee arthroscopy  08/09/2007 - left    07/15/2003 - right  . Toe fusion  07/12/2005    left 3rd toe PIP and DIP fusion  . Tendon release  09/09/2011    Procedure: HEEL CORD LENGTHENING;  Surgeon: Toni Arthurs, MD;  Location: [facility];  Service: Orthopedics;  Laterality: Right;  Righ tachilles tendon lengthening    No prescriptions prior to admission   Allergies  Allergen Reactions  . Nsaids Swelling    LIPS AND NECK  . Penicillins Hives and Swelling    Social History  Substance Use Topics  . Smoking status: Former Smoker    Types: Cigars    Quit date: 01/31/2011  . Smokeless tobacco: Never Used     Comment: smokes 1-2 cigars/week  . Alcohol Use: Yes     Comment: occasionally    Family History  Problem Relation Age of Onset  . Colon cancer      family history  . Alzheimer's disease Mother   . Diabetes Father      Review of Systems  Constitutional: Negative.   HENT: Negative.   Eyes: Negative.   Respiratory: Negative.    Cardiovascular: Negative.   Gastrointestinal: Positive for heartburn.  Genitourinary: Negative.   Musculoskeletal: Positive for joint pain.  Skin: Negative.   Endo/Heme/Allergies: Positive for environmental allergies.  Psychiatric/Behavioral: Negative.     Objective:  Physical Exam  Constitutional: He is oriented to person, place, and time. He appears well-developed and well-nourished.  HENT:  Head: Normocephalic.  Eyes: Pupils are equal, round, and reactive to light.  Neck: Neck supple. No JVD present. No tracheal deviation present. No thyromegaly present.  Cardiovascular: Normal rate, regular rhythm and intact distal pulses.   Respiratory: Effort normal and breath sounds normal. No stridor. No respiratory distress. He has no wheezes.  GI: Soft. There is no tenderness. There is no guarding.  Musculoskeletal:       Left knee: He exhibits decreased range of motion, swelling and bony tenderness. He exhibits no ecchymosis, no deformity, no laceration and no erythema. Tenderness found.  Lymphadenopathy:    He has no cervical adenopathy.  Neurological: He is alert and oriented to person, place, and time.  Skin: Skin is warm and dry.  Psychiatric: He has a normal mood and affect.      Labs:  Estimated body mass index is 44.21 kg/(m^2) as calculated from the following:   Height as of 01/01/15: 6\' 5"  (1.956 m).   Weight as of 01/01/15: 169.146 kg (372 lb 14.4 oz).   Imaging Review Plain radiographs demonstrate severe degenerative joint disease of the left knee(s). The overall alignment is neutral. The bone quality appears to be good for age and reported activity level.  Assessment/Plan:  End stage arthritis, left knee   The patient history, physical examination, clinical judgment of the provider and imaging studies are consistent with end stage degenerative joint disease of the left knee(s) and total knee arthroplasty is deemed medically necessary. The treatment options including  medical management, injection therapy arthroscopy and arthroplasty were discussed at length. The risks and benefits of total knee arthroplasty were presented and reviewed. The risks due to aseptic loosening, infection, stiffness, patella tracking problems, thromboembolic complications and other imponderables were discussed. The patient acknowledged the explanation, agreed to proceed with the plan and consent was signed. Patient is being admitted for inpatient treatment for surgery, pain control, PT, OT, prophylactic antibiotics, VTE prophylaxis, progressive ambulation and ADL's and discharge planning. The patient is planning to be discharged home with home health services.     Anastasio AuerbachMatthew S. Byrd Terrero   PA-C  02/19/2015, 1:02 PM

## 2015-03-10 ENCOUNTER — Encounter (HOSPITAL_COMMUNITY)
Admission: RE | Admit: 2015-03-10 | Discharge: 2015-03-10 | Disposition: A | Discharge status: Home / Self Care | Payer: Managed Care, Other (non HMO) | Source: Ambulatory Visit | Attending: Orthopedic Surgery | Admitting: Orthopedic Surgery

## 2015-03-10 ENCOUNTER — Encounter (HOSPITAL_COMMUNITY): Payer: Self-pay

## 2015-03-10 DIAGNOSIS — Z01812 Encounter for preprocedural laboratory examination: Secondary | ICD-10-CM | POA: Diagnosis not present

## 2015-03-10 HISTORY — DX: Unspecified osteoarthritis, unspecified site: M19.90

## 2015-03-10 HISTORY — DX: Polyneuropathy, unspecified: G62.9

## 2015-03-10 HISTORY — DX: Pneumonia, unspecified organism: J18.9

## 2015-03-10 HISTORY — DX: Cardiac arrhythmia, unspecified: I49.9

## 2015-03-10 HISTORY — DX: Other specified postprocedural states: Z98.890

## 2015-03-10 LAB — PROTIME-INR
INR: 1.12 (ref 0.00–1.49)
Prothrombin Time: 14.6 seconds (ref 11.6–15.2)

## 2015-03-10 LAB — URINALYSIS, ROUTINE W REFLEX MICROSCOPIC
BILIRUBIN URINE: NEGATIVE
Glucose, UA: NEGATIVE mg/dL
HGB URINE DIPSTICK: NEGATIVE
KETONES UR: NEGATIVE mg/dL
Leukocytes, UA: NEGATIVE
Nitrite: NEGATIVE
PH: 6.5 (ref 5.0–8.0)
Protein, ur: NEGATIVE mg/dL
SPECIFIC GRAVITY, URINE: 1.009 (ref 1.005–1.030)

## 2015-03-10 LAB — SURGICAL PCR SCREEN
MRSA, PCR: NEGATIVE
Staphylococcus aureus: NEGATIVE

## 2015-03-10 LAB — ABO/RH: ABO/RH(D): B POS

## 2015-03-10 LAB — BASIC METABOLIC PANEL
ANION GAP: 5 (ref 5–15)
BUN: 17 mg/dL (ref 6–20)
CHLORIDE: 105 mmol/L (ref 101–111)
CO2: 31 mmol/L (ref 22–32)
CREATININE: 1.07 mg/dL (ref 0.61–1.24)
Calcium: 8.9 mg/dL (ref 8.9–10.3)
GFR calc non Af Amer: >60 mL/min (ref >60)
Glucose, Bld: 143 mg/dL — ABNORMAL HIGH (ref 65–99)
POTASSIUM: 4.8 mmol/L (ref 3.5–5.1)
SODIUM: 141 mmol/L (ref 135–145)

## 2015-03-10 LAB — CBC
HEMATOCRIT: 41.4 % (ref 39.0–52.0)
HEMOGLOBIN: 13.3 g/dL (ref 13.0–17.0)
MCH: 28.9 pg (ref 26.0–34.0)
MCHC: 32.1 g/dL (ref 30.0–36.0)
MCV: 89.8 fL (ref 78.0–100.0)
PLATELETS: 195 10*3/uL (ref 150–400)
RBC: 4.61 MIL/uL (ref 4.22–5.81)
RDW: 14.3 % (ref 11.5–15.5)
WBC: 7.7 10*3/uL (ref 4.0–10.5)

## 2015-03-10 LAB — APTT: aPTT: 32 seconds (ref 24–37)

## 2015-03-10 NOTE — Progress Notes (Signed)
Clearance note per chart per Dr Particia NearingAquiar 10/04/2014  Clearance note per epic and on chart  per Dr Antoine PocheHochrein 01/01/2015 with LOV  EKG epic 01/01/2015 ECHO epic 12/30/2014

## 2015-03-10 NOTE — Patient Instructions (Signed)
Philip [name]  03/10/2015   Your procedure is scheduled on: Tuesday March 18, 2015   [facility]  [place] to 3rd floor to  Short Stay Center at 8:30 AM.  Call this number if you have problems the morning of surgery [phone]   Remember: ONLY 1 PERSON MAY GO WITH YOU TO SHORT STAY TO GET  READY MORNING OF YOUR SURGERY.  Do not eat food or drink liquids :After Midnight.     Take these medicines the morning of surgery with A SIP OF WATER: Cetirizine (Zyrtec) if needed;  DO NOT TAKE ANY DIABETIC MEDICATIONS DAY OF YOUR SURGERY                               You may not have any metal on your body including hair pins and              piercings  Do not wear jewelry,  lotions, powders or colognes, deodorant                           Men may shave face and neck.   Do not bring valuables to the hospital. [facility] IS NOT             RESPONSIBLE   FOR VALUABLES.  Contacts, dentures or bridgework may not be worn into surgery.  Leave suitcase in the car. After surgery it may be brought to your room.      Special Instructions: BRING CPAP MASK AND TUBING DAY OF SURGERY               Please read over the following fact sheets you were given:MRSA INFORMATION SHEET; INCENTIVE SPIROMETER; BLOOD TRANSFUSION INFORMATION SHEET _____________________________________________________________________             [facility] - Preparing for Surgery Before surgery, you can play an important role.  Because skin is not sterile, your skin needs to be as free of germs as possible.  You can reduce the number of germs on your skin by washing with CHG (chlorahexidine gluconate) soap before surgery.  CHG is an antiseptic cleaner which kills germs and bonds with the skin to continue killing germs even after washing. Please DO NOT use if you have an allergy to CHG or antibacterial soaps.  If your skin becomes reddened/irritated stop using the CHG and  inform your nurse when you arrive at Short Stay. Do not shave (including legs and underarms) for at least 48 hours prior to the first CHG shower.  You may shave your face/neck. Please follow these instructions carefully:  1.  Shower with CHG Soap the night before surgery and the  morning of Surgery.  2.  If you choose to wash your hair, wash your hair first as usual with your  normal  shampoo.  3.  After you shampoo, rinse your hair and body thoroughly to remove the  shampoo.                           4.  Use CHG as you would any other liquid soap.  You can apply chg directly  to the skin and wash  Gently with a scrungie or clean washcloth.  5.  Apply the CHG Soap to your body ONLY FROM THE NECK DOWN.   Do not use on face/ open                           Wound or open sores. Avoid contact with eyes, ears mouth and genitals (private parts).                       Wash face,  Genitals (private parts) with your normal soap.             6.  Wash thoroughly, paying special attention to the area where your surgery  will be performed.  7.  Thoroughly rinse your body with warm water from the neck down.  8.  DO NOT shower/wash with your normal soap after using and rinsing off  the CHG Soap.                9.  Pat yourself dry with a clean towel.            10.  Wear clean pajamas.            11.  Place clean sheets on your bed the night of your first shower and do not  sleep with pets. Day of Surgery : Do not apply any lotions/deodorants the morning of surgery.  Please wear clean clothes to the hospital/surgery center.  FAILURE TO FOLLOW THESE INSTRUCTIONS MAY RESULT IN THE CANCELLATION OF YOUR SURGERY PATIENT SIGNATURE_________________________________  NURSE SIGNATURE__________________________________  ________________________________________________________________________   [name]  An incentive spirometer is a tool that can help keep your lungs clear and  active. This tool measures how well you are filling your lungs with each breath. Taking long deep breaths may help reverse or decrease the chance of developing breathing (pulmonary) problems (especially infection) following:  A long period of time when you are unable to move or be active. BEFORE THE PROCEDURE   If the spirometer includes an indicator to show your best effort, your nurse or respiratory therapist will set it to a desired goal.  If possible, sit up straight or lean slightly forward. Try not to slouch.  Hold the incentive spirometer in an upright position. INSTRUCTIONS FOR USE   Sit on the edge of your bed if possible, or sit up as far as you can in bed or on a chair.  Hold the incentive spirometer in an upright position.  Breathe out normally.  Place the mouthpiece in your mouth and seal your lips tightly around it.  Breathe in slowly and as deeply as possible, raising the piston or the ball toward the top of the column.  Hold your breath for 3-5 seconds or for as long as possible. Allow the piston or ball to fall to the bottom of the column.  Remove the mouthpiece from your mouth and breathe out normally.  Rest for a few seconds and repeat Steps 1 through 7 at least 10 times every 1-2 hours when you are awake. Take your time and take a few normal breaths between deep breaths.  The spirometer may include an indicator to show your best effort. Use the indicator as a goal to work toward during each repetition.  After each set of 10 deep breaths, practice coughing to be sure your lungs are clear. If you have an incision (the cut made at the time of surgery),  support your incision when coughing by placing a pillow or rolled up towels firmly against it. Once you are able to get out of bed, walk around indoors and cough well. You may stop using the incentive spirometer when instructed by your caregiver.  RISKS AND COMPLICATIONS  Take your time so you do not get dizzy or  light-headed.  If you are in pain, you may need to take or ask for pain medication before doing incentive spirometry. It is harder to take a deep breath if you are having pain. AFTER USE  Rest and breathe slowly and easily.  It can be helpful to keep track of a log of your progress. Your caregiver can provide you with a simple table to help with this. If you are using the spirometer at home, follow these instructions: [place] IF:   You are having difficultly using the spirometer.  You have trouble using the spirometer as often as instructed.  Your pain medication is not giving enough relief while using the spirometer.  You develop fever of 100.5 F (38.1 C) or higher. SEEK IMMEDIATE MEDICAL CARE IF:   You cough up bloody sputum that had not been present before.  You develop fever of 102 F (38.9 C) or greater.  You develop worsening pain at or near the incision site. MAKE SURE YOU:   Understand these instructions.  Will watch your condition.  Will get help right away if you are not doing well or get worse. Document Released: 08/16/2006 Document Revised: 06/28/2011 Document Reviewed: 10/17/2006 Exit[employer] Patient Information 2014 [employer], [place].   ________________________________________________________________________  WHAT IS A BLOOD TRANSFUSION? Blood Transfusion Information  A transfusion is the replacement of blood or some of its parts. Blood is made up of multiple cells which provide different functions.  Red blood cells carry oxygen and are used for blood loss replacement.  White blood cells fight against infection.  Platelets control bleeding.  Plasma helps clot blood.  Other blood products are available for specialized needs, such as hemophilia or other clotting disorders. BEFORE THE TRANSFUSION  Who gives blood for transfusions?   Healthy volunteers who are fully evaluated to make sure their blood is safe. This is blood bank  blood. Transfusion therapy is the safest it has ever been in the practice of medicine. Before blood is taken from a donor, a complete history is taken to make sure that person has no history of diseases nor engages in risky social behavior (examples are intravenous drug use or sexual activity with multiple partners). The donor's travel history is screened to minimize risk of transmitting infections, such as malaria. The donated blood is tested for signs of infectious diseases, such as HIV and hepatitis. The blood is then tested to be sure it is compatible with you in order to minimize the chance of a transfusion reaction. If you or a relative donates blood, this is often done in anticipation of surgery and is not appropriate for emergency situations. It takes many days to process the donated blood. RISKS AND COMPLICATIONS Although transfusion therapy is very safe and saves many lives, the main dangers of transfusion include:   Getting an infectious disease.  Developing a transfusion reaction. This is an allergic reaction to something in the blood you were given. Every precaution is taken to prevent this. The decision to have a blood transfusion has been considered carefully by your caregiver before blood is given. Blood is not given unless the benefits outweigh the risks. AFTER THE TRANSFUSION  Right after receiving a blood transfusion, you will usually feel much better and more energetic. This is especially true if your red blood cells have gotten low (anemic). The transfusion raises the level of the red blood cells which carry oxygen, and this usually causes an energy increase.  The nurse administering the transfusion will monitor you carefully for complications. HOME CARE INSTRUCTIONS  No special instructions are needed after a transfusion. You may find your energy is better. Speak with your caregiver about any limitations on activity for underlying diseases you may have. SEEK MEDICAL CARE IF:    Your condition is not improving after your transfusion.  You develop redness or irritation at the intravenous (IV) site. SEEK IMMEDIATE MEDICAL CARE IF:  Any of the following symptoms occur over the next 12 hours:  Shaking chills.  You have a temperature by mouth above 102 F (38.9 C), not controlled by medicine.  Chest, back, or muscle pain.  People around you feel you are not acting correctly or are confused.  Shortness of breath or difficulty breathing.  Dizziness and fainting.  You get a rash or develop hives.  You have a decrease in urine output.  Your urine turns a dark color or changes to pink, red, or brown. Any of the following symptoms occur over the next 10 days:  You have a temperature by mouth above 102 F (38.9 C), not controlled by medicine.  Shortness of breath.  Weakness after normal activity.  The white part of the eye turns yellow (jaundice).  You have a decrease in the amount of urine or are urinating less often.  Your urine turns a dark color or changes to pink, red, or brown. Document Released: 04/02/2000 Document Revised: 06/28/2011 Document Reviewed: 11/20/2007 [facility] Patient Information 2014 [place], [place].  _______________________________________________________________________

## 2015-03-17 MED ORDER — VANCOMYCIN HCL 10 G IV SOLR
1500.0000 mg | INTRAVENOUS | Status: AC
  Administered 2015-03-18: 1500 mg via INTRAVENOUS
  Filled 2015-03-17 (×2): qty 1500

## 2015-03-18 ENCOUNTER — Inpatient Hospital Stay (HOSPITAL_COMMUNITY): Payer: Managed Care, Other (non HMO) | Admitting: Certified Registered Nurse Anesthetist

## 2015-03-18 ENCOUNTER — Inpatient Hospital Stay (HOSPITAL_COMMUNITY)
Admission: RE | Admit: 2015-03-18 | Discharge: 2015-03-19 | DRG: 470 | Disposition: A | Discharge status: Home Health | Payer: Managed Care, Other (non HMO) | Source: Ambulatory Visit | Attending: Orthopedic Surgery | Admitting: Orthopedic Surgery

## 2015-03-18 ENCOUNTER — Encounter (HOSPITAL_COMMUNITY)
Admission: RE | Disposition: A | Discharge status: Home Health | Payer: Self-pay | Source: Ambulatory Visit | Attending: Orthopedic Surgery

## 2015-03-18 ENCOUNTER — Encounter (HOSPITAL_COMMUNITY): Payer: Self-pay

## 2015-03-18 DIAGNOSIS — M1712 Unilateral primary osteoarthritis, left knee: Secondary | ICD-10-CM | POA: Diagnosis present

## 2015-03-18 DIAGNOSIS — Z833 Family history of diabetes mellitus: Secondary | ICD-10-CM | POA: Diagnosis not present

## 2015-03-18 DIAGNOSIS — E119 Type 2 diabetes mellitus without complications: Secondary | ICD-10-CM | POA: Diagnosis present

## 2015-03-18 DIAGNOSIS — M25462 Effusion, left knee: Secondary | ICD-10-CM | POA: Diagnosis present

## 2015-03-18 DIAGNOSIS — I10 Essential (primary) hypertension: Secondary | ICD-10-CM | POA: Diagnosis present

## 2015-03-18 DIAGNOSIS — M659 Synovitis and tenosynovitis, unspecified: Secondary | ICD-10-CM | POA: Diagnosis present

## 2015-03-18 DIAGNOSIS — Z87891 Personal history of nicotine dependence: Secondary | ICD-10-CM

## 2015-03-18 DIAGNOSIS — M25762 Osteophyte, left knee: Secondary | ICD-10-CM | POA: Diagnosis present

## 2015-03-18 DIAGNOSIS — Z9884 Bariatric surgery status: Secondary | ICD-10-CM

## 2015-03-18 DIAGNOSIS — Z880 Allergy status to penicillin: Secondary | ICD-10-CM | POA: Diagnosis not present

## 2015-03-18 DIAGNOSIS — Z800 Family history of malignant neoplasm of digestive organs: Secondary | ICD-10-CM

## 2015-03-18 DIAGNOSIS — Z886 Allergy status to analgesic agent status: Secondary | ICD-10-CM

## 2015-03-18 DIAGNOSIS — K219 Gastro-esophageal reflux disease without esophagitis: Secondary | ICD-10-CM | POA: Diagnosis present

## 2015-03-18 DIAGNOSIS — Z6841 Body Mass Index (BMI) 40.0 and over, adult: Secondary | ICD-10-CM | POA: Diagnosis not present

## 2015-03-18 DIAGNOSIS — G4730 Sleep apnea, unspecified: Secondary | ICD-10-CM | POA: Diagnosis present

## 2015-03-18 DIAGNOSIS — Z96652 Presence of left artificial knee joint: Secondary | ICD-10-CM

## 2015-03-18 DIAGNOSIS — K760 Fatty (change of) liver, not elsewhere classified: Secondary | ICD-10-CM | POA: Diagnosis present

## 2015-03-18 DIAGNOSIS — M109 Gout, unspecified: Secondary | ICD-10-CM | POA: Diagnosis present

## 2015-03-18 DIAGNOSIS — Z01812 Encounter for preprocedural laboratory examination: Secondary | ICD-10-CM | POA: Diagnosis not present

## 2015-03-18 DIAGNOSIS — Z8711 Personal history of peptic ulcer disease: Secondary | ICD-10-CM

## 2015-03-18 DIAGNOSIS — M25562 Pain in left knee: Secondary | ICD-10-CM | POA: Diagnosis present

## 2015-03-18 DIAGNOSIS — Z820 Family history of epilepsy and other diseases of the nervous system: Secondary | ICD-10-CM | POA: Diagnosis not present

## 2015-03-18 DIAGNOSIS — Z952 Presence of prosthetic heart valve: Secondary | ICD-10-CM

## 2015-03-18 DIAGNOSIS — Z96659 Presence of unspecified artificial knee joint: Secondary | ICD-10-CM

## 2015-03-18 HISTORY — PX: TOTAL KNEE ARTHROPLASTY: SHX125

## 2015-03-18 LAB — GLUCOSE, CAPILLARY
Glucose-Capillary: 146 mg/dL — ABNORMAL HIGH (ref 65–99)
Glucose-Capillary: 142 mg/dL — ABNORMAL HIGH (ref 65–99)
Glucose-Capillary: 211 mg/dL — ABNORMAL HIGH (ref 65–99)
Glucose-Capillary: 320 mg/dL — ABNORMAL HIGH (ref 65–99)

## 2015-03-18 LAB — TYPE AND SCREEN
ABO/RH(D): B POS
ANTIBODY SCREEN: NEGATIVE

## 2015-03-18 SURGERY — ARTHROPLASTY, KNEE, TOTAL
Anesthesia: Spinal | Site: Knee | Laterality: Left

## 2015-03-18 MED ORDER — HYDROMORPHONE HCL 1 MG/ML IJ SOLN
0.2500 mg | INTRAMUSCULAR | Status: DC | PRN
  Administered 2015-03-18 (×2): 0.5 mg via INTRAVENOUS

## 2015-03-18 MED ORDER — ESOMEPRAZOLE MAGNESIUM 20 MG PO CPDR
40.0000 mg | DELAYED_RELEASE_CAPSULE | Freq: Every day | ORAL | Status: DC
  Administered 2015-03-19: 40 mg via ORAL
  Filled 2015-03-18 (×2): qty 2

## 2015-03-18 MED ORDER — POLYETHYLENE GLYCOL 3350 17 G PO PACK
17.0000 g | PACK | Freq: Two times a day (BID) | ORAL | Status: DC
  Administered 2015-03-19: 17 g via ORAL

## 2015-03-18 MED ORDER — ONDANSETRON HCL 4 MG/2ML IJ SOLN
INTRAMUSCULAR | Status: DC | PRN
  Administered 2015-03-18: 4 mg via INTRAVENOUS

## 2015-03-18 MED ORDER — PROPOFOL 10 MG/ML IV BOLUS
INTRAVENOUS | Status: AC
  Filled 2015-03-18: qty 40

## 2015-03-18 MED ORDER — METOCLOPRAMIDE HCL 5 MG/ML IJ SOLN: 5.0000 mg | Freq: Three times a day (TID) | INTRAMUSCULAR | Status: DC | PRN

## 2015-03-18 MED ORDER — HYDROMORPHONE HCL 1 MG/ML IJ SOLN
INTRAMUSCULAR | Status: AC
  Filled 2015-03-18: qty 1

## 2015-03-18 MED ORDER — LINAGLIPTIN 5 MG PO TABS
5.0000 mg | ORAL_TABLET | Freq: Every day | ORAL | Status: DC
  Administered 2015-03-19: 5 mg via ORAL
  Filled 2015-03-18 (×2): qty 1

## 2015-03-18 MED ORDER — PROMETHAZINE HCL 25 MG/ML IJ SOLN: 6.2500 mg | INTRAMUSCULAR | Status: DC | PRN

## 2015-03-18 MED ORDER — HYDROCODONE-ACETAMINOPHEN 7.5-325 MG PO TABS
1.0000 | ORAL_TABLET | ORAL | Status: DC
  Administered 2015-03-18 (×2): 1 via ORAL
  Filled 2015-03-18: qty 2

## 2015-03-18 MED ORDER — DEXAMETHASONE SODIUM PHOSPHATE 10 MG/ML IJ SOLN
10.0000 mg | Freq: Once | INTRAMUSCULAR | Status: AC
  Administered 2015-03-19: 10 mg via INTRAVENOUS
  Filled 2015-03-18: qty 1

## 2015-03-18 MED ORDER — INSULIN ASPART 100 UNIT/ML ~~LOC~~ SOLN
0.0000 [IU] | Freq: Three times a day (TID) | SUBCUTANEOUS | Status: DC
  Administered 2015-03-18 – 2015-03-19 (×3): 5 [IU] via SUBCUTANEOUS

## 2015-03-18 MED ORDER — MIDAZOLAM HCL 5 MG/5ML IJ SOLN
INTRAMUSCULAR | Status: DC | PRN
  Administered 2015-03-18: 2 mg via INTRAVENOUS

## 2015-03-18 MED ORDER — SODIUM CHLORIDE 0.9 % IV SOLN
INTRAVENOUS | Status: DC
  Administered 2015-03-18: 22:00:00 via INTRAVENOUS
  Filled 2015-03-18 (×5): qty 1000

## 2015-03-18 MED ORDER — MIDAZOLAM HCL 2 MG/2ML IJ SOLN
INTRAMUSCULAR | Status: AC
  Filled 2015-03-18: qty 2

## 2015-03-18 MED ORDER — PROPOFOL 500 MG/50ML IV EMUL
INTRAVENOUS | Status: DC | PRN
  Administered 2015-03-18: 100 ug/kg/min via INTRAVENOUS

## 2015-03-18 MED ORDER — PHENYLEPHRINE 40 MCG/ML (10ML) SYRINGE FOR IV PUSH (FOR BLOOD PRESSURE SUPPORT)
PREFILLED_SYRINGE | INTRAVENOUS | Status: AC
  Filled 2015-03-18: qty 10

## 2015-03-18 MED ORDER — ACETAMINOPHEN 325 MG PO TABS
650.0000 mg | ORAL_TABLET | Freq: Three times a day (TID) | ORAL | Status: DC
  Administered 2015-03-18 – 2015-03-19 (×3): 650 mg via ORAL
  Filled 2015-03-18 (×5): qty 2

## 2015-03-18 MED ORDER — BUPIVACAINE IN DEXTROSE 0.75-8.25 % IT SOLN
INTRATHECAL | Status: DC | PRN
  Administered 2015-03-18: 2 mL via INTRATHECAL

## 2015-03-18 MED ORDER — METHOCARBAMOL 1000 MG/10ML IJ SOLN
500.0000 mg | Freq: Four times a day (QID) | INTRAVENOUS | Status: DC | PRN
  Administered 2015-03-18: 500 mg via INTRAVENOUS
  Filled 2015-03-18 (×2): qty 5

## 2015-03-18 MED ORDER — FERROUS SULFATE 325 (65 FE) MG PO TABS
325.0000 mg | ORAL_TABLET | Freq: Three times a day (TID) | ORAL | Status: DC
  Administered 2015-03-19 (×2): 325 mg via ORAL
  Filled 2015-03-18 (×4): qty 1

## 2015-03-18 MED ORDER — PROPOFOL 10 MG/ML IV BOLUS
INTRAVENOUS | Status: AC
  Filled 2015-03-18: qty 20

## 2015-03-18 MED ORDER — SODIUM CHLORIDE 0.9 % IR SOLN
Status: DC | PRN
  Administered 2015-03-18: 1000 mL

## 2015-03-18 MED ORDER — METFORMIN HCL 500 MG PO TABS
1000.0000 mg | ORAL_TABLET | Freq: Every day | ORAL | Status: DC
  Administered 2015-03-19: 1000 mg via ORAL
  Filled 2015-03-18 (×2): qty 2

## 2015-03-18 MED ORDER — DIPHENHYDRAMINE HCL 25 MG PO CAPS
25.0000 mg | ORAL_CAPSULE | Freq: Four times a day (QID) | ORAL | Status: DC | PRN
  Administered 2015-03-18: 25 mg via ORAL
  Filled 2015-03-18: qty 1

## 2015-03-18 MED ORDER — HYDROMORPHONE HCL 1 MG/ML IJ SOLN: 0.5000 mg | INTRAMUSCULAR | Status: DC | PRN

## 2015-03-18 MED ORDER — BUPIVACAINE-EPINEPHRINE (PF) 0.25% -1:200000 IJ SOLN
INTRAMUSCULAR | Status: DC | PRN
  Administered 2015-03-18: 30 mL

## 2015-03-18 MED ORDER — LIDOCAINE HCL (CARDIAC) 20 MG/ML IV SOLN
INTRAVENOUS | Status: DC | PRN
  Administered 2015-03-18: 50 mg via INTRAVENOUS

## 2015-03-18 MED ORDER — DEXAMETHASONE SODIUM PHOSPHATE 10 MG/ML IJ SOLN: 10.0000 mg | Freq: Once | INTRAMUSCULAR | Status: DC

## 2015-03-18 MED ORDER — FUROSEMIDE 40 MG PO TABS
40.0000 mg | ORAL_TABLET | Freq: Every day | ORAL | Status: DC
  Administered 2015-03-18 – 2015-03-19 (×2): 40 mg via ORAL
  Filled 2015-03-18 (×2): qty 1

## 2015-03-18 MED ORDER — VANCOMYCIN HCL IN DEXTROSE 1-5 GM/200ML-% IV SOLN
1000.0000 mg | Freq: Two times a day (BID) | INTRAVENOUS | Status: AC
  Administered 2015-03-18: 1000 mg via INTRAVENOUS
  Filled 2015-03-18: qty 200

## 2015-03-18 MED ORDER — MENTHOL 3 MG MT LOZG: 1.0000 | LOZENGE | OROMUCOSAL | Status: DC | PRN

## 2015-03-18 MED ORDER — KETOROLAC TROMETHAMINE 30 MG/ML IJ SOLN
INTRAMUSCULAR | Status: DC | PRN
  Administered 2015-03-18: 30 mg via INTRAVENOUS

## 2015-03-18 MED ORDER — CETIRIZINE HCL 10 MG PO TABS
10.0000 mg | ORAL_TABLET | Freq: Every day | ORAL | Status: DC
  Administered 2015-03-19: 10 mg via ORAL
  Filled 2015-03-18: qty 1

## 2015-03-18 MED ORDER — ASPIRIN EC 325 MG PO TBEC
325.0000 mg | DELAYED_RELEASE_TABLET | Freq: Two times a day (BID) | ORAL | Status: DC
  Administered 2015-03-19: 325 mg via ORAL
  Filled 2015-03-18 (×3): qty 1

## 2015-03-18 MED ORDER — KETOROLAC TROMETHAMINE 30 MG/ML IJ SOLN
INTRAMUSCULAR | Status: AC
  Filled 2015-03-18: qty 1

## 2015-03-18 MED ORDER — METOLAZONE 5 MG PO TABS: 5.0000 mg | ORAL_TABLET | ORAL | Status: DC

## 2015-03-18 MED ORDER — BUPIVACAINE-EPINEPHRINE (PF) 0.25% -1:200000 IJ SOLN
INTRAMUSCULAR | Status: AC
  Filled 2015-03-18: qty 30

## 2015-03-18 MED ORDER — TRANEXAMIC ACID 1000 MG/10ML IV SOLN
1000.0000 mg | Freq: Once | INTRAVENOUS | Status: AC
  Administered 2015-03-18: 1000 mg via INTRAVENOUS
  Filled 2015-03-18: qty 10

## 2015-03-18 MED ORDER — BISACODYL 10 MG RE SUPP
10.0000 mg | Freq: Every day | RECTAL | Status: DC | PRN
Start: 2015-03-18 — End: 2015-03-19

## 2015-03-18 MED ORDER — DEXAMETHASONE SODIUM PHOSPHATE 10 MG/ML IJ SOLN
INTRAMUSCULAR | Status: DC | PRN
  Administered 2015-03-18: 10 mg via INTRAVENOUS

## 2015-03-18 MED ORDER — DOCUSATE SODIUM 100 MG PO CAPS
100.0000 mg | ORAL_CAPSULE | Freq: Two times a day (BID) | ORAL | Status: DC
  Administered 2015-03-18 – 2015-03-19 (×2): 100 mg via ORAL

## 2015-03-18 MED ORDER — PHENOL 1.4 % MT LIQD: 1.0000 | OROMUCOSAL | Status: DC | PRN

## 2015-03-18 MED ORDER — CHLORHEXIDINE GLUCONATE 4 % EX LIQD: 60.0000 mL | Freq: Once | CUTANEOUS | Status: DC

## 2015-03-18 MED ORDER — ALUM & MAG HYDROXIDE-SIMETH 200-200-20 MG/5ML PO SUSP: 30.0000 mL | ORAL | Status: DC | PRN

## 2015-03-18 MED ORDER — PHENYLEPHRINE HCL 10 MG/ML IJ SOLN
INTRAMUSCULAR | Status: DC | PRN
  Administered 2015-03-18 (×6): 80 ug via INTRAVENOUS

## 2015-03-18 MED ORDER — CELECOXIB 200 MG PO CAPS
200.0000 mg | ORAL_CAPSULE | Freq: Two times a day (BID) | ORAL | Status: DC
  Administered 2015-03-18 – 2015-03-19 (×2): 200 mg via ORAL
  Filled 2015-03-18 (×3): qty 1

## 2015-03-18 MED ORDER — SIMVASTATIN 10 MG PO TABS
10.0000 mg | ORAL_TABLET | Freq: Every day | ORAL | Status: DC
  Administered 2015-03-18: 10 mg via ORAL
  Filled 2015-03-18 (×2): qty 1

## 2015-03-18 MED ORDER — MAGNESIUM CITRATE PO SOLN: 1.0000 | Freq: Once | ORAL | Status: DC | PRN

## 2015-03-18 MED ORDER — METOCLOPRAMIDE HCL 10 MG PO TABS: 5.0000 mg | ORAL_TABLET | Freq: Three times a day (TID) | ORAL | Status: DC | PRN

## 2015-03-18 MED ORDER — SODIUM CHLORIDE 0.9 % IJ SOLN
INTRAMUSCULAR | Status: DC | PRN
  Administered 2015-03-18: 30 mL

## 2015-03-18 MED ORDER — ONDANSETRON HCL 4 MG PO TABS: 4.0000 mg | ORAL_TABLET | Freq: Four times a day (QID) | ORAL | Status: DC | PRN

## 2015-03-18 MED ORDER — SITAGLIPTIN PHOS-METFORMIN HCL 50-1000 MG PO TABS: 1.0000 | ORAL_TABLET | Freq: Every day | ORAL | Status: DC

## 2015-03-18 MED ORDER — LACTATED RINGERS IV SOLN
INTRAVENOUS | Status: DC
  Administered 2015-03-18 (×3): via INTRAVENOUS

## 2015-03-18 MED ORDER — ONDANSETRON HCL 4 MG/2ML IJ SOLN: 4.0000 mg | Freq: Four times a day (QID) | INTRAMUSCULAR | Status: DC | PRN

## 2015-03-18 MED ORDER — OXYCODONE HCL 5 MG PO TABS
5.0000 mg | ORAL_TABLET | ORAL | Status: DC
  Administered 2015-03-18: 10 mg via ORAL
  Administered 2015-03-19 (×2): 15 mg via ORAL
  Administered 2015-03-19 (×2): 10 mg via ORAL
  Filled 2015-03-18: qty 2
  Filled 2015-03-18: qty 3
  Filled 2015-03-18 (×2): qty 2
  Filled 2015-03-18: qty 3

## 2015-03-18 MED ORDER — 0.9 % SODIUM CHLORIDE (POUR BTL) OPTIME
TOPICAL | Status: DC | PRN
  Administered 2015-03-18: 1000 mL

## 2015-03-18 MED ORDER — SODIUM CHLORIDE 0.9 % IJ SOLN
INTRAMUSCULAR | Status: AC
  Filled 2015-03-18: qty 50

## 2015-03-18 MED ORDER — POTASSIUM CHLORIDE ER 10 MEQ PO TBCR
10.0000 meq | EXTENDED_RELEASE_TABLET | Freq: Every day | ORAL | Status: DC
  Administered 2015-03-19: 10 meq via ORAL
  Filled 2015-03-18: qty 1

## 2015-03-18 MED ORDER — METHOCARBAMOL 500 MG PO TABS
500.0000 mg | ORAL_TABLET | Freq: Four times a day (QID) | ORAL | Status: DC | PRN
  Administered 2015-03-18 – 2015-03-19 (×2): 500 mg via ORAL
  Filled 2015-03-18 (×2): qty 1

## 2015-03-18 SURGICAL SUPPLY — 46 items
BAG DECANTER FOR FLEXI CONT (MISCELLANEOUS) IMPLANT
BAG SPEC THK2 15X12 ZIP CLS (MISCELLANEOUS)
BAG ZIPLOCK 12X15 (MISCELLANEOUS) IMPLANT
BANDAGE ELASTIC 6 VELCRO ST LF (GAUZE/BANDAGES/DRESSINGS) ×2 IMPLANT
BLADE SAW SGTL 13.0X1.19X90.0M (BLADE) ×2 IMPLANT
BONE CEMENT GENTAMICIN (Cement) ×4 IMPLANT
BOWL SMART MIX CTS (DISPOSABLE) ×2 IMPLANT
CAP KNEE TOTAL 3 SIGMA ×1 IMPLANT
CEMENT BONE GENTAMICIN 40 (Cement) IMPLANT
CLOTH BEACON ORANGE TIMEOUT ST (SAFETY) ×2 IMPLANT
CUFF TOURN SGL QUICK 34 (TOURNIQUET CUFF) ×2
CUFF TRNQT CYL 34X4X40X1 (TOURNIQUET CUFF) ×1 IMPLANT
DECANTER SPIKE VIAL GLASS SM (MISCELLANEOUS) ×2 IMPLANT
DRAPE U-SHAPE 47X51 STRL (DRAPES) ×2 IMPLANT
DRSG AQUACEL AG ADV 3.5X10 (GAUZE/BANDAGES/DRESSINGS) ×2 IMPLANT
DURAPREP 26ML APPLICATOR (WOUND CARE) ×4 IMPLANT
ELECT REM PT RETURN 9FT ADLT (ELECTROSURGICAL) ×2
ELECTRODE REM PT RTRN 9FT ADLT (ELECTROSURGICAL) ×1 IMPLANT
GLOVE BIOGEL M 7.0 STRL (GLOVE) IMPLANT
GLOVE BIOGEL PI IND STRL 7.5 (GLOVE) ×1 IMPLANT
GLOVE BIOGEL PI IND STRL 8.5 (GLOVE) ×1 IMPLANT
GLOVE BIOGEL PI INDICATOR 7.5 (GLOVE) ×1
GLOVE BIOGEL PI INDICATOR 8.5 (GLOVE) ×1
GLOVE ECLIPSE 8.0 STRL XLNG CF (GLOVE) ×2 IMPLANT
GLOVE ORTHO TXT STRL SZ7.5 (GLOVE) ×4 IMPLANT
GOWN STRL REUS W/TWL LRG LVL3 (GOWN DISPOSABLE) ×2 IMPLANT
GOWN STRL REUS W/TWL XL LVL3 (GOWN DISPOSABLE) ×2 IMPLANT
HANDPIECE INTERPULSE COAX TIP (DISPOSABLE) ×2
LIQUID BAND (GAUZE/BANDAGES/DRESSINGS) ×2 IMPLANT
MANIFOLD NEPTUNE II (INSTRUMENTS) ×2 IMPLANT
PACK TOTAL KNEE CUSTOM (KITS) ×2 IMPLANT
POSITIONER SURGICAL ARM (MISCELLANEOUS) ×2 IMPLANT
SET HNDPC FAN SPRY TIP SCT (DISPOSABLE) ×1 IMPLANT
SET PAD KNEE POSITIONER (MISCELLANEOUS) ×2 IMPLANT
SUCTION FRAZIER 12FR DISP (SUCTIONS) ×2 IMPLANT
SUT MNCRL AB 4-0 PS2 18 (SUTURE) ×2 IMPLANT
SUT VIC AB 1 CT1 36 (SUTURE) ×3 IMPLANT
SUT VIC AB 2-0 CT1 27 (SUTURE) ×6
SUT VIC AB 2-0 CT1 TAPERPNT 27 (SUTURE) ×3 IMPLANT
SUT VLOC 180 0 24IN GS25 (SUTURE) ×2 IMPLANT
SYR 50ML LL SCALE MARK (SYRINGE) ×2 IMPLANT
TRAY FOLEY W/METER SILVER 16FR (SET/KITS/TRAYS/PACK) ×2 IMPLANT
TRAY TIB SZ 5 REVISION (Knees) ×1 IMPLANT
WATER STERILE IRR 1500ML POUR (IV SOLUTION) ×2 IMPLANT
WRAP KNEE MAXI GEL POST OP (GAUZE/BANDAGES/DRESSINGS) ×2 IMPLANT
YANKAUER SUCT BULB TIP 10FT TU (MISCELLANEOUS) ×2 IMPLANT

## 2015-03-18 NOTE — Transfer of Care (Signed)
Immediate Anesthesia Transfer of Care Note  Patient: Philip [name]  Procedure(s) Performed: Procedure(s): LEFT TOTAL KNEE ARTHROPLASTY (Left)  Patient Location: PACU  Anesthesia Type:Spinal  Level of Consciousness: awake, alert  and oriented  Airway & Oxygen Therapy: Patient Spontanous Breathing and Patient connected to nasal cannula oxygen  Post-op Assessment: Report given to RN and Post -op Vital signs reviewed and stable  Post vital signs: Reviewed and stable  Last Vitals:  Filed Vitals:   03/18/15 0839  BP: 132/84  Pulse: 74  Temp: 36.6 C  Resp: 18    Complications: No apparent anesthesia complications

## 2015-03-18 NOTE — Anesthesia Preprocedure Evaluation (Addendum)
Anesthesia Evaluation  Patient identified by MRN, date of birth, ID band Patient awake    Reviewed: Allergy & Precautions, NPO status , Patient's Chart, lab work & pertinent test results  Airway Mallampati: II  TM Distance: >3 FB Neck ROM: Full    Dental no notable dental hx.    Pulmonary neg pulmonary ROS, sleep apnea , pneumonia, former smoker,    Pulmonary exam normal breath sounds clear to auscultation       Cardiovascular hypertension, Pt. on medications Normal cardiovascular exam+ dysrhythmias  Rhythm:Regular Rate:Normal  S/P AVR   Neuro/Psych negative neurological ROS  negative psych ROS   GI/Hepatic Neg liver ROS, PUD, GERD  ,  Endo/Other  diabetes, Type obesity  Renal/GU negative Renal ROS  negative genitourinary   Musculoskeletal negative musculoskeletal ROS (+) Arthritis ,   Abdominal (+) + obese,   Peds negative pediatric ROS (+)  Hematology negative hematology ROS (+)   Anesthesia Other Findings   Reproductive/Obstetrics negative OB ROS                            Anesthesia Physical Anesthesia Plan  ASA: III  Anesthesia Plan: Spinal   Post-op Pain Management:    Induction: Intravenous  Airway Management Planned:   Additional Equipment:   Intra-op Plan:   Post-operative Plan: Extubation in OR  Informed Consent: I have reviewed the patients History and Physical, chart, labs and discussed the procedure including the risks, benefits and alternatives for the proposed anesthesia with the patient or authorized representative who has indicated his/her understanding and acceptance.   Dental advisory given  Plan Discussed with: CRNA  Anesthesia Plan Comments: (Discussed risks and benefits of and differences between spinal and general. Discussed risks of spinal including headache, backache, failure, bleeding and hematoma, infection, and nerve damage. Patient  consents to spinal. Questions answered. Coagulation studies and platelet count acceptable.)        Anesthesia Quick Evaluation

## 2015-03-18 NOTE — Op Note (Signed)
NAME:  Philip [name] RECORD NO.:  [id number]                             FACILITY:  [facility]      PHYSICIAN:  Madlyn Frankel. Charlann Boxer, M.D.  DATE OF BIRTH:  [birth year]      DATE OF PROCEDURE:  03/18/2015                                     OPERATIVE REPORT         PREOPERATIVE DIAGNOSIS:  Left knee osteoarthritis.      POSTOPERATIVE DIAGNOSIS:  Left knee osteoarthritis.      FINDINGS:  The patient was noted to have complete loss of cartilage and   bone-on-bone arthritis with associated osteophytes in the medial and patellofemoral compartments of   the knee with a significant synovitis and associated effusion.      PROCEDURE:  Left total knee replacement.      COMPONENTS USED:  [employer] rotating platform posterior stabilized knee   system, a size 6 femur, size 5 MBT revision tibial tray (due to size), size 12.5 mm PS insert, and 41 patellar   button.      SURGEON:  Madlyn Frankel. Charlann Boxer, M.D.      ASSISTANT:  Skip Mayer, PA-C.      ANESTHESIA:  Spinal.      SPECIMENS:  None.      COMPLICATION:  None.      DRAINS:  None.  EBL: <100cc      TOURNIQUET TIME:   Total Tourniquet Time Documented: Thigh (Left) - 35 minutes Total: Thigh (Left) - 35 minutes  .      The patient was stable to the recovery room.      INDICATION FOR PROCEDURE:  Philip [name] is a 54 y.o. male patient of   mine.  The patient had been seen, evaluated, and treated conservatively in the   office with medication, activity modification, and injections.  The patient had   radiographic changes of bone-on-bone arthritis with endplate sclerosis and osteophytes noted.      The patient failed conservative measures including medication, injections, and activity modification, and at this point was ready for more definitive measures.   Based on the radiographic changes and failed conservative measures, the patient   decided to proceed with total knee replacement.  Risks of infection,   DVT,  component failure, need for revision surgery, postop course, and   expectations were all   discussed and reviewed.  Consent was obtained for benefit of pain   relief.      PROCEDURE IN DETAIL:  The patient was brought to the operative theater.   Once adequate anesthesia, preoperative antibiotics, 1.5 gm of Vancomycin, 1 gm of Tranexamic Acid, and 10 mg of Decadron administered, the patient was positioned supine with the left thigh tourniquet placed.  The  left lower extremity was prepped and draped in sterile fashion.  A time-   out was performed identifying the patient, planned procedure, and   extremity.      The left lower extremity was placed in the [facility] leg holder.  The leg was   exsanguinated, tourniquet elevated to 250 mmHg.  A midline  incision was   made followed by median parapatellar arthrotomy.  Following initial   exposure, attention was first directed to the patella.  Precut   measurement was noted to be 26-27 mm.  I resected down to 15 mm and used a   41 patellar button to restore patellar height as well as cover the cut   surface.      The lug holes were drilled and a metal shim was placed to protect the   patella from retractors and saw blades.      At this point, attention was now directed to the femur.  The femoral   canal was opened with a drill, irrigated to try to prevent fat emboli.  An   intramedullary rod was passed at 5 degrees valgus, 10 mm of bone was   resected off the distal femur.  Following this resection, the tibia was   subluxated anteriorly.  Using the extramedullary guide, 2 mm of bone was resected off   the proximal medial tibia.  We confirmed the gap would be   stable medially and laterally with a 10 mm insert as well as confirmed   the cut was perpendicular in the coronal plane, checking with an alignment rod.      Once this was done, I sized the femur to be a size 6 in the anterior-   posterior dimension, chose a standard component based on  medial and   lateral dimension.  The size 6 rotation block was then pinned in   position anterior referenced using the C-clamp to set rotation.  The   anterior, posterior, and  chamfer cuts were made without difficulty nor   notching making certain that I was along the anterior cortex to help   with flexion gap stability.      The final box cut was made off the lateral aspect of distal femur.      At this point, the tibia was sized to be a size 5, the size 5 tray was   then pinned in position through the medial third of the tubercle,   drilled for a MBT revision tray then the tibial trial tray was keel punched into correct orientation.  Trial reduction was now carried with a 6 femur,  5 tibia, a size 12.5 mm PS insert, and the 41 patella botton.  The knee was brought to   extension, full extension with good flexion stability with the patella   tracking through the trochlea without application of pressure.  Given   all these findings, the trial components removed.  Final components were   opened and cement was mixed.  The knee was irrigated with normal saline   solution and pulse lavage.  The synovial lining was   then injected with 30 cc of 0.25% Marcaine with epinephrine and 1 cc of Toradol plus 30 cc of NS for a  total of 61 cc.      The knee was irrigated.  Final implants were then cemented onto clean and   dried cut surfaces of bone with the knee brought to extension with a size 12.5 mm trial insert.      Once the cement had fully cured, the excess cement was removed   throughout the knee.  I confirmed I was satisfied with the range of   motion and stability, and the final size 12.5 mm PS insert was chosen.  It was   placed into the knee.      The tourniquet  had been let down at 35 minutes.  No significant   hemostasis required.  The   extensor mechanism was then reapproximated using #1 Vicryl and #0 V-lock sutures with the knee   in flexion.  The   remaining wound was closed  with 2-0 Vicryl and running 4-0 Monocryl.   The knee was cleaned, dried, dressed sterilely using Dermabond and   Aquacel dressing.  The patient was then   brought to recovery room in stable condition, tolerating the procedure   well.   Please note that Physician Assistant, Skip Mayer, PA-C, was present for the entirety of the case, and was utilized for pre-operative positioning, peri-operative retractor management, general facilitation of the procedure.  He was also utilized for primary wound closure at the end of the case.              Madlyn Frankel Charlann Boxer, M.D.    03/18/2015 1:09 PM

## 2015-03-18 NOTE — Evaluation (Signed)
Physical Therapy Evaluation Patient Details Name: Philip [name] MRN: [id number] DOB: 27-Jul-1960 Today's Date: 03/18/2015   History of Present Illness  54 yo male s/p L TKA 03/18/15  Clinical Impression  On eval POD 0, pt required Min assist (+2 for safety) for mobility-walked ~75 feet with RW. Pain rated 6/10 in L thigh. Pt tolerated activity well. Pt is very motivated to progress. Will follow and progress activity as tolerated.     Follow Up Recommendations Home health PT    Equipment Recommendations  Rolling walker with 5" wheels (bariatric)    Recommendations for Other Services       Precautions / Restrictions Precautions Precautions: Fall Restrictions Weight Bearing Restrictions: No LLE Weight Bearing: Weight bearing as tolerated      Mobility  Bed Mobility Overal bed mobility: Needs Assistance Bed Mobility: Supine to Sit     Supine to sit: HOB elevated;Min assist     General bed mobility comments: Assist for L LE.   Transfers Overall transfer level: Needs assistance Equipment used: Rolling walker (2 wheeled) Transfers: Sit to/from Stand Sit to Stand: Min assist;From elevated surface         General transfer comment: Assist to rise, stabilize, control descent. VCs safety, hand/LE placement  Ambulation/Gait Ambulation/Gait assistance: Min guard;+2 safety/equipment Ambulation Distance (Feet): 75 Feet Assistive device: Rolling walker (2 wheeled) Gait Pattern/deviations: Step-to pattern;Step-through pattern     General Gait Details: slow but steady gait speed. VCs safety, sequence. Pt transitioned to reciprocal gait pattern after ~50 feet.   Stairs            Wheelchair Mobility    Modified Rankin (Stroke Patients Only)       Balance                                             Pertinent Vitals/Pain Pain Assessment: 0-10 Pain Score: 6  Pain Location: L thigh Pain Descriptors / Indicators: Sore Pain Intervention(s):  Monitored during session;Repositioned;Ice applied    Home Living Family/patient expects to be discharged to:: Private residence Living Arrangements: Spouse/significant other   Type of Home: House Home Access: Stairs to enter Entrance Stairs-Rails: None Entrance Stairs-Number of Steps: 1+1+1 Home Layout: Two level;Able to live on main level with bedroom/bathroom Home Equipment: None      Prior Function Level of Independence: Independent               Hand Dominance        Extremity/Trunk Assessment   Upper Extremity Assessment: Defer to OT evaluation           Lower Extremity Assessment: LLE deficits/detail   LLE Deficits / Details: SLR 3/5, moves ankle well. Light touch sensation mostly intact however pt reports some numbness in buttocks area  Cervical / Trunk Assessment: Normal  Communication   Communication: No difficulties  Cognition Arousal/Alertness: Awake/alert Behavior During Therapy: WFL for tasks assessed/performed Overall Cognitive Status: Within Functional Limits for tasks assessed                      General Comments      Exercises        Assessment/Plan    PT Assessment Patient needs continued PT services  PT Diagnosis Difficulty walking;Acute pain   PT Problem List Decreased strength;Decreased range of motion;Decreased activity tolerance;Decreased balance;Decreased mobility;Decreased knowledge of use of DME;Pain  PT Treatment Interventions DME instruction;Gait training;Stair training;Functional mobility training;Therapeutic activities;Patient/family education;Balance training;Therapeutic exercise   PT Goals (Current goals can be found in the Care Plan section) Acute Rehab PT Goals Patient Stated Goal: regain PLOF PT Goal Formulation: With patient Time For Goal Achievement: 03/25/15 Potential to Achieve Goals: Good    Frequency 7X/week   Barriers to discharge        Co-evaluation               End of Session    Activity Tolerance: Patient tolerated treatment well Patient left: in chair;with call bell/phone within reach;with family/visitor present;with chair alarm set           Time: 1725-1738 PT Time Calculation (min) (ACUTE ONLY): 13 min   Charges:   PT Evaluation $Initial PT Evaluation Tier I: 1 Procedure     PT G Codes:        Rebeca Alert, MPT Pager: [phone]

## 2015-03-18 NOTE — Discharge Instructions (Signed)

## 2015-03-18 NOTE — Anesthesia Procedure Notes (Signed)
Spinal Patient location during procedure: OR Start time: 03/18/2015 11:28 AM End time: 03/18/2015 11:35 AM Staffing Anesthesiologist: Sherrian DiversENENNY, BRUCE Resident/CRNA: Neysa BonitoINMAN, Rikayla Demmon F Performed by: resident/CRNA  Preanesthetic Checklist Completed: patient identified, site marked, surgical consent, pre-op evaluation, timeout performed, IV checked, risks and benefits discussed and monitors and equipment checked Spinal Block Patient position: sitting Prep: Betadine Patient monitoring: heart rate, cardiac monitor, continuous pulse ox and blood pressure Approach: midline Location: L3-4 Injection technique: single-shot Needle Needle type: Sprotte  Needle gauge: 24 G Needle length: 5 cm Assessment Sensory level: T6 Additional Notes Patient positioned sitting for SAB. Sterile prep and drape. SAB placed without difficulty. Surgical level obtained. Spinocan Spinal Tray Lot # [phone] Exp 2016-08-16

## 2015-03-18 NOTE — Interval H&P Note (Signed)
History and Physical Interval Note:  03/18/2015 10:29 AM  Philip [name]  has presented today for surgery, with the diagnosis of LEFT KNEE OA  The various methods of treatment have been discussed with the patient and family. After consideration of risks, benefits and other options for treatment, the patient has consented to  Procedure(s): LEFT TOTAL KNEE ARTHROPLASTY (Left) as a surgical intervention .  The patient's history has been reviewed, patient examined, no change in status, stable for surgery.  I have reviewed the patient's chart and labs.  Questions were answered to the patient's satisfaction.     Shelda PalLIN,Raimi Guillermo D

## 2015-03-18 NOTE — Anesthesia Postprocedure Evaluation (Signed)
Anesthesia Post Note  Patient: Philip [name]  Procedure(s) Performed: Procedure(s) (LRB): LEFT TOTAL KNEE ARTHROPLASTY (Left)  Patient location during evaluation: PACU Anesthesia Type: Spinal Level of consciousness: oriented and awake and alert Pain management: pain level controlled Vital Signs Assessment: post-procedure vital signs reviewed and stable Respiratory status: spontaneous breathing, respiratory function stable and patient connected to nasal cannula oxygen Cardiovascular status: blood pressure returned to baseline and stable Postop Assessment: no headache and no backache Anesthetic complications: no    Last Vitals:  Filed Vitals:   03/18/15 1500 03/18/15 1515  BP: 109/81 122/78  Pulse: 68 65  Temp:  36.6 C  Resp: 12 12    Last Pain:  Filed Vitals:   03/18/15 1518  PainSc: 0-No pain    LLE Motor Response: Purposeful movement LLE Sensation: Decreased RLE Motor Response: Purposeful movement RLE Sensation: Decreased L Sensory Level: S1-Sole of foot, small toes R Sensory Level: S1-Sole of foot, small toes  Concepcion Gillott J

## 2015-03-19 ENCOUNTER — Encounter (HOSPITAL_COMMUNITY): Payer: Self-pay | Admitting: Orthopedic Surgery

## 2015-03-19 LAB — GLUCOSE, CAPILLARY
Glucose-Capillary: 201 mg/dL — ABNORMAL HIGH (ref 65–99)
Glucose-Capillary: 241 mg/dL — ABNORMAL HIGH (ref 65–99)

## 2015-03-19 LAB — BASIC METABOLIC PANEL
ANION GAP: 5 (ref 5–15)
BUN: 20 mg/dL (ref 6–20)
CALCIUM: 8.4 mg/dL — AB (ref 8.9–10.3)
CO2: 26 mmol/L (ref 22–32)
Chloride: 102 mmol/L (ref 101–111)
Creatinine, Ser: 1.28 mg/dL — ABNORMAL HIGH (ref 0.61–1.24)
GFR calc non Af Amer: >60 mL/min (ref >60)
Glucose, Bld: 228 mg/dL — ABNORMAL HIGH (ref 65–99)
POTASSIUM: 4.7 mmol/L (ref 3.5–5.1)
Sodium: 133 mmol/L — ABNORMAL LOW (ref 135–145)

## 2015-03-19 LAB — HEMOGLOBIN A1C
Hgb A1c MFr Bld: 7.8 % — ABNORMAL HIGH (ref 4.8–5.6)
Mean Plasma Glucose: 177 mg/dL

## 2015-03-19 LAB — CBC
HEMATOCRIT: 37.2 % — AB (ref 39.0–52.0)
HEMOGLOBIN: 11.5 g/dL — AB (ref 13.0–17.0)
MCH: 27.5 pg (ref 26.0–34.0)
MCHC: 30.9 g/dL (ref 30.0–36.0)
MCV: 89 fL (ref 78.0–100.0)
Platelets: 157 10*3/uL (ref 150–400)
RBC: 4.18 MIL/uL — AB (ref 4.22–5.81)
RDW: 13.8 % (ref 11.5–15.5)
WBC: 12.3 10*3/uL — AB (ref 4.0–10.5)

## 2015-03-19 MED ORDER — POLYETHYLENE GLYCOL 3350 17 G PO PACK: 17.0000 g | PACK | Freq: Two times a day (BID) | ORAL | Status: DC

## 2015-03-19 MED ORDER — OXYCODONE HCL 5 MG PO TABS: 5.0000 mg | ORAL_TABLET | ORAL | Status: DC | PRN

## 2015-03-19 MED ORDER — ACETAMINOPHEN 325 MG PO TABS: 650.0000 mg | ORAL_TABLET | Freq: Three times a day (TID) | ORAL | Status: DC

## 2015-03-19 MED ORDER — METHOCARBAMOL 500 MG PO TABS: 500.0000 mg | ORAL_TABLET | Freq: Four times a day (QID) | ORAL | Status: DC | PRN

## 2015-03-19 MED ORDER — FERROUS SULFATE 325 (65 FE) MG PO TABS: 325.0000 mg | ORAL_TABLET | Freq: Three times a day (TID) | ORAL | Status: DC

## 2015-03-19 MED ORDER — DOCUSATE SODIUM 100 MG PO CAPS: 100.0000 mg | ORAL_CAPSULE | Freq: Two times a day (BID) | ORAL | Status: DC

## 2015-03-19 MED ORDER — ASPIRIN 325 MG PO TBEC: 325.0000 mg | DELAYED_RELEASE_TABLET | Freq: Two times a day (BID) | ORAL | Status: AC

## 2015-03-19 NOTE — Evaluation (Signed)
  Occupational Therapy Evaluation Patient Details Name: Philip [name] MRN: [id number] DOB: 1960/07/09 Today's Date: 03/19/2015    History of Present Illness 54 yo male s/p L TKA 03/18/15   Clinical Impression   OT education complete    Follow Up Recommendations  No OT follow up    Equipment Recommendations  None recommended by OT    Recommendations for Other Services       Precautions / Restrictions Precautions Precautions: Fall;Knee Precaution Comments: Reviewed knee precautions Restrictions Weight Bearing Restrictions: No LLE Weight Bearing: Weight bearing as tolerated      Mobility Bed Mobility Overal bed mobility: Needs Assistance Bed Mobility: Sit to Supine       Sit to supine: Min guard;Supervision      Transfers Overall transfer level: Needs assistance Equipment used: Rolling walker (2 wheeled) Transfers: Sit to/from U[employer] CorporationStand;Stand Pivot Transfers Sit to Stand: Supervision Stand pivot transfers: Supervision       General transfer comment: cues for LE management and use of UEs to self assist         ADL Overall ADL's : Needs assistance/impaired                 Upper Body Dressing : Set up;Sitting   Lower Body Dressing: Moderate assistance;Sit to/from stand   Toilet Transfer: Minimal assistance;BSC;RW   Toileting- Clothing Manipulation and Hygiene: Minimal assistance;Sit to/from stand     Pt obtained AE.  Educated in use                      Pertinent Vitals/Pain Pain Assessment: 0-10 Pain Score: 3  Pain Location: L thigh/knee Pain Descriptors / Indicators: Aching;Sore Pain Intervention(s): Limited activity within patient's tolerance;Monitored during session;Premedicated before session;Ice applied     Hand Dominance     Extremity/Trunk Assessment Upper Extremity Assessment Upper Extremity Assessment: Overall WFL for tasks assessed           Communication Communication Communication: No difficulties   Cognition  Arousal/Alertness: Awake/alert Behavior During Therapy: WFL for tasks assessed/performed Overall Cognitive Status: Within Functional Limits for tasks assessed                        Exercises Exercises: Total Joint          Home Living Family/patient expects to be discharged to:: Private residence Living Arrangements: Spouse/significant other   Type of Home: House Home Access: Stairs to enter Enter[employer] CorporationEntrance Stairs-Number of Steps: 1+1+1 Entrance Stairs-Rails: None Home Layout: Two level;Able to live on main level with bedroom/bathroom               Home Equipment: None          Prior Functioning/Environment Level of Independence: Independent                      OT Goals(Current goals can be found in the care plan section) Acute Rehab OT Goals Patient Stated Goal: regain PLOF  OT Frequency:                End of Session Nurse Communication: Mobility status  Activity Tolerance: Patient tolerated treatment well Patient left: in chair;with call bell/phone within reach;with family/visitor present   Time: 1191-47820950-1037 OT Time Calculation (min): 47 min Charges:  OT General Charges $OT Visit: 1 Procedure OT Treatments $Self Care/Home Management : 23-37 mins G-Codes:    Einar CrowEDDING, Luverne Zerkle D 03/19/2015, 11:33 AM

## 2015-03-19 NOTE — Progress Notes (Signed)
     Subjective: 1 Day Post-Op Procedure(s) (LRB): LEFT TOTAL KNEE ARTHROPLASTY (Left)   Patient reports pain as mild, pain well controlled. No events throughout the night. Feels that he is doing quite well. Ready to be discharged home.  Objective:   VITALS:   Filed Vitals:   03/19/15 0123 03/19/15 0548  BP: 120/81 125/80  Pulse: 63 72  Temp: 97.5 F (36.4 C) 98.2 F (36.8 C)  Resp: 15 16    Dorsiflexion/Plantar flexion intact Incision: dressing C/D/I No cellulitis present Compartment soft  LABS  Recent Labs  03/19/15 0540  HGB 11.5*  HCT 37.2*  WBC 12.3*  PLT 157     Recent Labs  03/19/15 0540  NA 133*  K 4.7  BUN 20  CREATININE 1.28*  GLUCOSE 228*     Assessment/Plan: 1 Day Post-Op Procedure(s) (LRB): LEFT TOTAL KNEE ARTHROPLASTY (Left) Foley cath d/c'ed Advance diet Up with therapy D/C IV fluids Discharge home with home health  [facility] Follow up with OLIN,Cindel Daugherty D in 2 weeks.  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   Weight as of this encounter: 170.751 kg (376 lb 7 oz). Patient also counseled that weight may inhibit the healing process Patient counseled that losing weight will help with future health issues         Anastasio AuerbachMatthew S. Emilo Gras   PAC  03/19/2015, 9:37 AM

## 2015-03-19 NOTE — Progress Notes (Signed)
Physical Therapy Treatment Patient Details Name: Philip [name] MRN: [id number] DOB: 1960/09/24 Today's Date: 03/19/2015    History of Present Illness 54 yo male s/p L TKA 03/18/15    PT Comments    Pt progressing well with mobility.  Reviewed therex and stairs.  Follow Up Recommendations  Home health PT     Equipment Recommendations  Rolling walker with 5" wheels    Recommendations for Other Services OT consult     Precautions / Restrictions Precautions Precautions: Fall;Knee Precaution Comments: Reviewed knee precautions Restrictions Weight Bearing Restrictions: No LLE Weight Bearing: Weight bearing as tolerated    Mobility  Bed Mobility Overal bed mobility: Modified Independent Bed Mobility: Sit to Supine       Sit to supine: Modified independent (Device/Increase time)      Transfers Overall transfer level: Needs assistance Equipment used: Rolling walker (2 wheeled) Transfers: Sit to/from Stand Sit to Stand: Supervision         General transfer comment: min cues for LE management and use of UEs to self assist  Ambulation/Gait Ambulation/Gait assistance: Supervision Ambulation Distance (Feet): 200 Feet Assistive device: Rolling walker (2 wheeled) Gait Pattern/deviations: Step-to pattern;Step-through pattern;Shuffle;Trunk flexed     General Gait Details: min cues for posture and position from RW   Stairs Stairs: Yes Stairs assistance: Min guard Stair Management: No rails;Step to pattern;Forwards;With walker Number of Stairs: 2 (single step twice) General stair comments: Cues for sequence and foot/RW placement  Wheelchair Mobility    Modified Rankin (Stroke Patients Only)       Balance                                    Cognition Arousal/Alertness: Awake/alert Behavior During Therapy: WFL for tasks assessed/performed Overall Cognitive Status: Within Functional Limits for tasks assessed                       Exercises Total Joint Exercises Ankle Circles/Pumps: AROM;Both;15 reps;Supine Quad Sets: AROM;Both;15 reps;Supine Heel Slides: AAROM;Left;15 reps;Supine Straight Leg Raises: AAROM;AROM;Left;15 reps;Supine    General Comments        Pertinent Vitals/Pain Pain Assessment: 0-10 Pain Score: 4  Pain Location: L thigh/knee Pain Descriptors / Indicators: Aching;Sore Pain Intervention(s): Limited activity within patient's tolerance;Monitored during session;Premedicated before session;Ice applied    Home Living                      Prior Function            PT Goals (current goals can now be found in the care plan section) Acute Rehab PT Goals Patient Stated Goal: regain PLOF PT Goal Formulation: With patient Time For Goal Achievement: 03/25/15 Potential to Achieve Goals: Good Progress towards PT goals: Progressing toward goals    Frequency  7X/week    PT Plan Current plan remains appropriate    Co-evaluation             End of Session   Activity Tolerance: Patient tolerated treatment well Patient left: in bed;with call bell/phone within reach;with family/visitor present     Time: 1253-1316 PT Time Calculation (min) (ACUTE ONLY): 23 min  Charges:  $Gait Training: 8-22 mins $Therapeutic Exercise: 8-22 mins                    G Codes:      Philip [name] 03/19/2015, 4:20 PM

## 2015-03-19 NOTE — Progress Notes (Signed)
Physical Therapy Treatment Patient Details Name: Philip [name] MRN: [id number] DOB: 12-04-1960 Today's Date: 03/19/2015    History of Present Illness 54 yo male s/p L TKA 03/18/15    PT Comments    Pt progressing well with mobility and hopeful for dc home this pm.  Follow Up Recommendations  Home health PT     Equipment Recommendations  Rolling walker with 5" wheels    Recommendations for Other Services       Precautions / Restrictions Precautions Precautions: Fall;Knee Precaution Comments: Reviewed knee precautions Restrictions Weight Bearing Restrictions: No LLE Weight Bearing: Weight bearing as tolerated    Mobility  Bed Mobility Overal bed mobility: Needs Assistance Bed Mobility: Sit to Supine       Sit to supine: Min guard      Transfers Overall transfer level: Needs assistance Equipment used: Rolling walker (2 wheeled) Transfers: Sit to/from Stand Sit to Stand: Min guard         General transfer comment: cues for LE management and use of UEs to self assist  Ambulation/Gait Ambulation/Gait assistance: Min guard Ambulation Distance (Feet): 140 Feet Assistive device: Rolling walker (2 wheeled) Gait Pattern/deviations: Step-to pattern;Step-through pattern;Shuffle;Trunk flexed Gait velocity: mod pace   General Gait Details: cues for posture and position from [employer]   Stairs            Wheelchair Mobility    Modified Rankin (Stroke Patients Only)       Balance                                    Cognition Arousal/Alertness: Awake/alert Behavior During Therapy: WFL for tasks assessed/performed Overall Cognitive Status: Within Functional Limits for tasks assessed                      Exercises Total Joint Exercises Ankle Circles/Pumps: AROM;Both;15 reps;Supine Quad Sets: AROM;Both;15 reps;Supine Heel Slides: AAROM;Left;15 reps;Supine Straight Leg Raises: AAROM;AROM;Left;15 reps;Supine Goniometric ROM: AAROM at  L knee -10 - 75    General Comments        Pertinent Vitals/Pain Pain Assessment: 0-10 Pain Score: 3  Pain Location: L thigh/knee Pain Descriptors / Indicators: Aching;Sore Pain Intervention(s): Limited activity within patient's tolerance;Monitored during session;Premedicated before session;Ice applied    Home Living                      Prior Function            PT Goals (current goals can now be found in the care plan section) Acute Rehab PT Goals Patient Stated Goal: regain PLOF PT Goal Formulation: With patient Time For Goal Achievement: 03/25/15 Potential to Achieve Goals: Good Progress towards PT goals: Progressing toward goals    Frequency  7X/week    PT Plan Current plan remains appropriate    Co-evaluation             End of Session Equipment Utilized During Treatment: Gait belt Activity Tolerance: Patient tolerated treatment well Patient left: in bed;with call bell/phone within reach;with family/visitor present     Time: [id number] PT Time Calculation (min) (ACUTE ONLY): 24 min  Charges:  $Gait Training: 8-22 mins $Therapeutic Exercise: 8-22 mins                    G Codes:      Adrena Nakamura 03/19/2015, 10:13 AM

## 2015-03-20 ENCOUNTER — Telehealth: Payer: Self-pay | Admitting: *Deleted

## 2015-03-20 NOTE — Telephone Encounter (Signed)
CM following case on 02/3015 (P[place], RNCM)  had LM  that no info on [facility] was available prior to actual discharge of pt on 03/19/2015.  Writer received call from [place],  [place] [facility] that she had arranged HHPT with [facility] [phone]) and had called pt and LM. Updated Discharge Instructions and follow up. No further CM needs.

## 2015-03-21 NOTE — Discharge Summary (Signed)
Physician Discharge Summary  Patient ID: Philip [name] MRN: [id number] DOB/AGE: 1961/02/02 54 y.o.  Admit date: 03/18/2015 Discharge date: 03/19/2015   Procedures:  Procedure(s) (LRB): LEFT TOTAL KNEE ARTHROPLASTY (Left)  Attending Physician:  Dr. Durene Romans   Admission Diagnoses:   Left knee primary OA / pain  Discharge Diagnoses:  Principal Problem:   S/P left TKA Active Problems:   S/P knee replacement   Morbid obesity (HCC)  Past Medical History  Diagnosis Date  . Gastric ulcer     no current problems  . Obesity   . Seasonal allergies   . Gout   . GERD (gastroesophageal reflux disease)   . Sleep apnea     uses CPAP nightly  . Fatty liver   . Diabetes mellitus     NIDDM  . Dental crowns present   . 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 Hx of bacterial endocarditis 2005  . H/O mitral valve repair   . Dysrhythmia   . History of bronchitis as a child   . Pneumonia     history of as child   . Hypertension     under control; states med. is really to protect kidneys due to diabetes  . Neuropathy (HCC)     feet bilat   . Arthritis     HPI:    [name], 54 y.o. male, has a history of pain and functional disability in the left knee due to arthritis and has failed non-surgical conservative treatments for greater than 12 weeks to include NSAID's and/or analgesics, corticosteriod injections, viscosupplementation injections and activity modification. Onset of symptoms was gradual, starting years ago with gradually worsening course since that time. The patient noted prior procedures on the knee to include arthroscopy on the left knee(s). Patient currently rates pain in the left knee(s) at 8 out of 10 with activity. Patient has worsening of pain with activity and weight bearing, pain that interferes with activities of daily  living, pain with passive range of motion, crepitus and joint swelling. Patient has evidence of periarticular osteophytes and joint space narrowing by imaging studies. There is no active infection. Risks, benefits and expectations were discussed with the patient. Risks including but not limited to the risk of anesthesia, blood clots, nerve damage, blood vessel damage, failure of the prosthesis, infection and up to and including death. Patient understand the risks, benefits and expectations and wishes to proceed with surgery.   PCP: [name]., MD   Discharged Condition: good  Hospital Course:  Patient underwent the above stated procedure on 03/18/2015. Patient tolerated the procedure well and brought to the recovery room in good condition and subsequently to the floor.  POD #1 BP: 125/80 ; Pulse: 72 ; Temp: 98.2 F (36.8 C) ; Resp: 16 Patient reports pain as mild, pain well controlled. No events throughout the night. Feels that he is doing quite well. Ready to be discharged home. Dorsiflexion/plantar flexion intact, incision: dressing C/D/I, no cellulitis present and compartment soft.   LABS  Basename    HGB     11.5  HCT     37.2    Discharge Exam: General appearance: alert, cooperative and no distress Extremities: Homans sign is negative, no sign of DVT, no edema, redness or tenderness in the calves or thighs and no  ulcers, gangrene or trophic changes  Disposition: Home with follow up in 2 weeks   Follow-up Information    Follow up with Shelda Pal, MD. Schedule an appointment as soon as possible for a visit in 2 weeks.   Specialty:  Orthopedic Surgery   Contact information:   [address] [place] [address] [phone]       Follow up with [facility].   Specialty:  Home Health Services   Why:  HHPT; Representative should make contact within 24 hours; please call [phone] and ask for CM dept if they do not.    Contact information:    [address] [place] [address] [phone]       Discharge Instructions    Call MD / Call 911    Complete by:  As directed   If you experience chest pain or shortness of breath, CALL 911 and be transported to the hospital emergency room.  If you develope a fever above 101 F, pus (white drainage) or increased drainage or redness at the wound, or calf pain, call your surgeon's office.     Change dressing    Complete by:  As directed   Maintain surgical dressing until follow up in the clinic. If the edges start to pull up, may reinforce with tape. If the dressing is no longer working, may remove and cover with gauze and tape, but must keep the area dry and clean.  Call with any questions or concerns.     Constipation Prevention    Complete by:  As directed   Drink plenty of fluids.  Prune juice may be helpful.  You may use a stool softener, such as Colace (over the counter) 100 mg twice a day.  Use MiraLax (over the counter) for constipation as needed.     Diet - low sodium heart healthy    Complete by:  As directed      Discharge instructions    Complete by:  As directed   Maintain surgical dressing until follow up in the clinic. If the edges start to pull up, may reinforce with tape. If the dressing is no longer working, may remove and cover with gauze and tape, but must keep the area dry and clean.  Follow up in 2 weeks at [facility]. Call with any questions or concerns.     Increase activity slowly as tolerated    Complete by:  As directed   Weight bearing as tolerated with assist device (walker, cane, etc) as directed, use it as long as suggested by your surgeon or therapist, typically at least 4-6 weeks.     TED hose    Complete by:  As directed   Use stockings (TED hose) for 2 weeks on both leg(s).  You may remove them at night for sleeping.             Medication List    TAKE these medications        acetaminophen 325 MG tablet  Commonly  known as:  TYLENOL  Take 2 tablets (650 mg total) by mouth every 8 (eight) hours.     aspirin 325 MG EC tablet  Take 1 tablet (325 mg total) by mouth 2 (two) times daily.     b complex vitamins tablet  Take 1 tablet by mouth daily.     CALCIUM + D PO  Take 1 tablet by mouth 2 (two) times daily. 2 tabs daily  CELEBREX 200 MG capsule  Generic drug:  celecoxib  TAKE ONE TABLET DAILY IN THE MORNING     docusate sodium 100 MG capsule  Commonly known as:  COLACE  Take 1 capsule (100 mg total) by mouth 2 (two) times daily.     ferrous sulfate 325 (65 FE) MG tablet  Take 1 tablet (325 mg total) by mouth 3 (three) times daily after meals.     furosemide 40 MG tablet  Commonly known as:  LASIX  Take 40 mg by mouth daily.     JANUMET 50-1000 MG tablet  Generic drug:  sitaGLIPtin-metformin  Take 1 tablet by mouth Twice daily.     KLOR-CON 8 MEQ tablet  Generic drug:  potassium chloride  Take 1 tablet by mouth Daily. AM     Melatonin 3 MG Caps  Take 1 capsule by mouth at bedtime.     methocarbamol 500 MG tablet  Commonly known as:  ROBAXIN  Take 1 tablet (500 mg total) by mouth every 6 (six) hours as needed for muscle spasms.     metolazone 5 MG tablet  Commonly known as:  ZAROXOLYN  Take 5 mg by mouth once a week. Saturdays     NEXIUM 40 MG capsule  Generic drug:  esomeprazole  Take 1 tablet by mouth daily. PM     oxyCODONE 5 MG immediate release tablet  Commonly known as:  Oxy IR/ROXICODONE  Take 1-3 tablets (5-15 mg total) by mouth every 4 (four) hours as needed for severe pain.     polyethylene glycol packet  Commonly known as:  MIRALAX / GLYCOLAX  Take 17 g by mouth 2 (two) times daily.     ramipril 5 MG capsule  Commonly known as:  ALTACE  Take 5 mg by mouth Daily. PM     simvastatin 10 MG tablet  Commonly known as:  ZOCOR  Take 10 mg by mouth at bedtime as needed.     vitamin C 1000 MG tablet  Take 1,000 mg by mouth daily.     ZYRTEC ALLERGY PO  Take  10 mg by mouth daily.         Signed: Anastasio AuerbachMatthew S. Fumiye Lubben   PA-C  03/21/2015, 8:06 AM

## 2015-04-29 ENCOUNTER — Ambulatory Visit (HOSPITAL_COMMUNITY)
Admission: RE | Admit: 2015-04-29 | Discharge: 2015-04-29 | Disposition: A | Discharge status: Home / Self Care | Payer: Managed Care, Other (non HMO) | Source: Ambulatory Visit | Attending: Cardiology | Admitting: Cardiology

## 2015-04-29 ENCOUNTER — Other Ambulatory Visit (HOSPITAL_COMMUNITY): Payer: Self-pay | Admitting: Sports Medicine

## 2015-04-29 DIAGNOSIS — M7989 Other specified soft tissue disorders: Secondary | ICD-10-CM | POA: Insufficient documentation

## 2015-04-29 DIAGNOSIS — E119 Type 2 diabetes mellitus without complications: Secondary | ICD-10-CM | POA: Diagnosis not present

## 2015-04-29 DIAGNOSIS — M79604 Pain in right leg: Secondary | ICD-10-CM | POA: Diagnosis not present

## 2015-04-29 DIAGNOSIS — I10 Essential (primary) hypertension: Secondary | ICD-10-CM | POA: Diagnosis not present

## 2015-04-29 DIAGNOSIS — R590 Localized enlarged lymph nodes: Secondary | ICD-10-CM | POA: Diagnosis not present

## 2015-08-06 ENCOUNTER — Encounter: Payer: Self-pay | Admitting: Vascular Surgery

## 2015-08-13 ENCOUNTER — Ambulatory Visit (INDEPENDENT_AMBULATORY_CARE_PROVIDER_SITE_OTHER): Payer: Managed Care, Other (non HMO) | Admitting: Vascular Surgery

## 2015-08-13 ENCOUNTER — Encounter: Payer: Self-pay | Admitting: Vascular Surgery

## 2015-08-13 VITALS — BP 115/86 | HR 85 | Temp 97.5°F | Resp 18 | Ht 77.0 in | Wt 355.0 lb

## 2015-08-13 DIAGNOSIS — I83893 Varicose veins of bilateral lower extremities with other complications: Secondary | ICD-10-CM | POA: Diagnosis not present

## 2015-08-13 DIAGNOSIS — I872 Venous insufficiency (chronic) (peripheral): Secondary | ICD-10-CM | POA: Diagnosis not present

## 2015-08-13 DIAGNOSIS — I83892 Varicose veins of left lower extremities with other complications: Secondary | ICD-10-CM | POA: Insufficient documentation

## 2015-08-13 NOTE — Progress Notes (Signed)
Referred by:  [name], MD [address] [place], [place] [address]  Reason for referral: recurrent bleeding varicosities   History of Present Illness  Philip [name] is a 55 y.o. 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  This patient has a long standing history of varicosities in both legs.  He has had recurrent bleeding from a spider vein in his left medial foot 3 times.  The patient's symptoms include: B leg swelling.  The patient has had no history of DVT, known history of varicose vein, no history of venous stasis ulcers, no history of  Lymphedema and known history of skin changes in lower legs.  There is known family history of venous disorders.  The patient has used knee high compression stockings in the past.   Past Medical History  Diagnosis Date  . Gastric ulcer     no current problems  . Obesity   . Seasonal allergies   . Gout   . GERD (gastroesophageal reflux disease)   . Sleep apnea     uses CPAP nightly  . Fatty liver   . Diabetes mellitus     NIDDM  . Dental crowns present   . 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 Hx of bacterial endocarditis 2005  . H/O mitral valve repair   . Dysrhythmia   . History of bronchitis as a child   . Pneumonia     history of as child   . Hypertension     under control; states med. is really to protect kidneys due to diabetes  . Neuropathy (HCC)     feet bilat   . Arthritis     Past Surgical History  Procedure Laterality Date  . Gastric bypass  03/2004  . Aortic valve replacement  11/2003  . Tonsillectomy and adenoidectomy    . Nasal sinus surgery    . Metatarsal osteotomy  03/20/2010    right 1st MT; gastroc soleus recession  . Knee arthroscopy  08/09/2007 - left    07/15/2003 - right  . Toe fusion  07/12/2005   left 3rd toe PIP and DIP fusion  . Tendon release  09/09/2011    Procedure: HEEL CORD LENGTHENING;  Surgeon: Toni Arthurs, MD;  Location: [facility];  Service: Orthopedics;  Laterality: Right;  Righ tachilles tendon lengthening  . Adenoid surgery     . Cardiac catheterization    . Total knee arthroplasty Left 03/18/2015    Procedure: LEFT TOTAL KNEE ARTHROPLASTY;  Surgeon: Durene Romans, MD;  Location: [facility];  Service: Orthopedics;  Laterality: Left;    Social History   Social History  . Marital Status: Married    Spouse Name: N/A  . Number of Children: N/A  . Years of Education: N/A   Occupational History  . Not on file.   Social History Main Topics  . Smoking status: Former Smoker -- 1.00 packs/day for 2 years    Types: Cigars, Pipe, Cigarettes    Quit date: 01/31/2011  . Smokeless tobacco: Never Used     Comment: smokes 1-2 cigars/week  . Alcohol Use: 0.0 oz/week    0 Standard drinks or equivalent per week     Comment: occasionally  . Drug Use: No  .  Sexual Activity: Not on file   Other Topics Concern  . Not on file   Social History Narrative    Family History  Problem Relation Age of Onset  . Colon cancer      family history  . Alzheimer's disease Mother   . Diabetes Father     Current Outpatient Prescriptions  Medication Sig Dispense Refill  . Ascorbic Acid (VITAMIN C) 1000 MG tablet Take 1,000 mg by mouth daily.      Marland Kitchen aspirin 81 MG tablet Take 81 mg by mouth daily.    Marland Kitchen b complex vitamins tablet Take 1 tablet by mouth daily.    . Calcium Carbonate-Vitamin D (CALCIUM + D PO) Take 1 tablet by mouth 2 (two) times daily. 2 tabs daily    . CELEBREX 200 MG capsule TAKE ONE TABLET DAILY IN THE MORNING    . Cetirizine HCl (ZYRTEC ALLERGY PO) Take 10 mg by mouth daily.      . furosemide (LASIX) 40 MG tablet Take 40 mg by mouth daily.    Marland Kitchen JANUMET 50-1000 MG per tablet Take 1 tablet by mouth Twice daily.    Marland Kitchen KLOR-CON 8 MEQ CR tablet Take 1 tablet by  mouth Daily. AM    . Melatonin 3 MG CAPS Take 1 capsule by mouth at bedtime.     . metolazone (ZAROXOLYN) 5 MG tablet Take 5 mg by mouth once a week. Saturdays    . NEXIUM 40 MG capsule Take 1 tablet by mouth daily. PM    . ramipril (ALTACE) 5 MG capsule Take 5 mg by mouth Daily. PM    . simvastatin (ZOCOR) 10 MG tablet Take 10 mg by mouth at bedtime as needed.     Marland Kitchen acetaminophen (TYLENOL) 325 MG tablet Take 2 tablets (650 mg total) by mouth every 8 (eight) hours. (Patient not taking: Reported on 08/13/2015)    . docusate sodium (COLACE) 100 MG capsule Take 1 capsule (100 mg total) by mouth 2 (two) times daily. (Patient not taking: Reported on 08/13/2015) 10 capsule 0  . ferrous sulfate 325 (65 FE) MG tablet Take 1 tablet (325 mg total) by mouth 3 (three) times daily after meals. (Patient not taking: Reported on 08/13/2015)  3  . methocarbamol (ROBAXIN) 500 MG tablet Take 1 tablet (500 mg total) by mouth every 6 (six) hours as needed for muscle spasms. (Patient not taking: Reported on 08/13/2015) 50 tablet 0  . oxyCODONE (OXY IR/ROXICODONE) 5 MG immediate release tablet Take 1-3 tablets (5-15 mg total) by mouth every 4 (four) hours as needed for severe pain. (Patient not taking: Reported on 08/13/2015) 120 tablet 0  . polyethylene glycol (MIRALAX / GLYCOLAX) packet Take 17 g by mouth 2 (two) times daily. (Patient not taking: Reported on 08/13/2015) 14 each 0   No current facility-administered medications for this visit.    Allergies  Allergen Reactions  . Nsaids Swelling and Other (See Comments)    NO REACTION LISTED LIPS AND NECK  . Penicillins Hives, Swelling and Other (See Comments)    NO REACTION LISTED Has patient had a PCN reaction causing immediate rash, facial/tongue/throat swelling, SOB or lightheadedness with hypotension: Yes Has patient had a PCN reaction causing severe rash involving mucus membranes or skin necrosis: No Has patient had a PCN reaction that required hospitalization  No Has patient had a PCN reaction occurring within the last 10 years: No If all of the above answers are "NO", then may proceed with Cephalosporin use.  Has taken Keflex w/o issue      . Other Other (See Comments)    CYCLOOXYGENASE INHIBITORS-NO REACTION LISTED.     REVIEW OF SYSTEMS:  (Positives checked otherwise negative)  CARDIOVASCULAR:   [ ]  chest pain,  [ ]  chest pressure,  [ ]  palpitations,  [ ]  shortness of breath when laying flat,  [ ]  shortness of breath with exertion,   [ ]  pain in feet when walking,  [ ]  pain in feet when laying flat, [ ]  history of blood clot in veins (DVT),  [ ]  history of phlebitis,  [x]  swelling in legs,  [x]  varicose veins  PULMONARY:   [ ]  productive cough,  [ ]  asthma,  [ ]  wheezing  NEUROLOGIC:   [ ]  weakness in arms or legs,  [ ]  numbness in arms or legs,  [ ]  difficulty speaking or slurred speech,  [ ]  temporary loss of vision in one eye,  [ ]  dizziness  HEMATOLOGIC:   [ ]  bleeding problems,  [ ]  problems with blood clotting too easily  MUSCULOSKEL:   [ ]  joint pain, [ ]  joint swelling  GASTROINTEST:   [ ]  vomiting blood,  [ ]  blood in stool     GENITOURINARY:   [ ]  burning with urination,  [ ]  blood in urine  PSYCHIATRIC:   [ ]  history of major depression  INTEGUMENTARY:   [ ]  rashes,  [ ]  ulcers  CONSTITUTIONAL:   [ ]  fever,  [ ]  chills   Physical Examination  Filed Vitals:   08/13/15 1548  BP: 115/86  Pulse: 85  Temp: 97.5 F (36.4 C)  Resp: 18  Height: 6\' 5"  (1.956 m)  Weight: 355 lb (161.027 kg)  SpO2: 98%   Body mass index is 42.09 kg/(m^2).  General: A&O x 3, WD, mobidly obese,   Head: [place]/AT  Ear/Nose/Throat: Hearing grossly intact, nares without erythema or drainage, oropharynx without Erythema/Exudate, Mallampati score: 3  Eyes: PERRLA, EOMI  Neck: Supple, no nuchal rigidity, no palpable LAD  Pulmonary: Sym exp, good air movt, CTAB, no rales, rhonchi, & wheezing  Cardiac:  RRR, Nl S1, S2, no Murmurs, rubs or gallops  Vascular: Vessel Right Left  Radial Palpable Palpable  Brachial Palpable Palpable  Carotid Palpable, without bruit Palpable, without bruit  Aorta Not palpable N/A  Femoral Palpable Palpable  Popliteal Not palpable Not palpable  PT Palpable Palpable  DP Palpable Palpable   Gastrointestinal: soft, NTND, no G/R, no HSM, no masses, no CVAT B  Musculoskeletal: M/S 5/5 throughout , Extremities without ischemic changes , mild B LDS, BLE edema 1+, extensive spider veins and varicosities, tense bilateral calves, scab on spider vein on medial L foot  Neurologic: CN 2-12 intact , Pain and light touch intact in extremities , Motor exam as listed above  Psychiatric: Judgment intact, Mood & affect appropriate for pt's clinical situation  Dermatologic: See M/S exam for extremity exam, no rashes otherwise noted  Lymph : No Cervical, Axillary, or Inguinal lymphadenopathy    Medical Decision Making  Trenton Gammonlan M Shall is a 55 y.o. male who presents with: BLE chronic venous insufficiency (C4), varicose veins with recurrent bleeding   Based on the patient's history and examination, I recommend: compressive therapy.  Will get BLE venous reflux duplex to formally diagnosis his CVI and determine the extent and severity of such.  I discussed with the patient the use of her 20-30 mm thigh high compression stockings.  He will follow up  in the next 4 weeks with the above studies.  Thank you for allowing [place] to participate in this patient's care.   Leonides Sake, MD [facility] Office: [phone] Pager: [phone]  08/13/2015, 4:15 PM

## 2015-08-15 ENCOUNTER — Other Ambulatory Visit: Payer: Self-pay | Admitting: *Deleted

## 2015-08-15 DIAGNOSIS — I872 Venous insufficiency (chronic) (peripheral): Secondary | ICD-10-CM

## 2015-08-15 DIAGNOSIS — R600 Localized edema: Secondary | ICD-10-CM

## 2015-08-18 ENCOUNTER — Other Ambulatory Visit: Payer: Self-pay | Admitting: *Deleted

## 2015-08-18 ENCOUNTER — Ambulatory Visit (HOSPITAL_COMMUNITY)
Admission: RE | Admit: 2015-08-18 | Discharge: 2015-08-18 | Disposition: A | Discharge status: Home / Self Care | Payer: Managed Care, Other (non HMO) | Source: Ambulatory Visit | Attending: Vascular Surgery | Admitting: Vascular Surgery

## 2015-08-18 DIAGNOSIS — I8393 Asymptomatic varicose veins of bilateral lower extremities: Secondary | ICD-10-CM | POA: Insufficient documentation

## 2015-08-18 DIAGNOSIS — L97519 Non-pressure chronic ulcer of other part of right foot with unspecified severity: Secondary | ICD-10-CM | POA: Diagnosis not present

## 2015-08-18 DIAGNOSIS — K219 Gastro-esophageal reflux disease without esophagitis: Secondary | ICD-10-CM | POA: Diagnosis not present

## 2015-08-18 DIAGNOSIS — I10 Essential (primary) hypertension: Secondary | ICD-10-CM | POA: Diagnosis not present

## 2015-08-18 DIAGNOSIS — I872 Venous insufficiency (chronic) (peripheral): Secondary | ICD-10-CM | POA: Diagnosis not present

## 2015-08-18 DIAGNOSIS — E11621 Type 2 diabetes mellitus with foot ulcer: Secondary | ICD-10-CM | POA: Insufficient documentation

## 2015-08-18 DIAGNOSIS — I83893 Varicose veins of bilateral lower extremities with other complications: Secondary | ICD-10-CM

## 2015-08-18 DIAGNOSIS — R600 Localized edema: Secondary | ICD-10-CM

## 2015-08-29 ENCOUNTER — Encounter: Payer: Self-pay | Admitting: Vascular Surgery

## 2015-09-05 ENCOUNTER — Ambulatory Visit (INDEPENDENT_AMBULATORY_CARE_PROVIDER_SITE_OTHER): Payer: Managed Care, Other (non HMO) | Admitting: Vascular Surgery

## 2015-09-05 ENCOUNTER — Encounter: Payer: Self-pay | Admitting: Vascular Surgery

## 2015-09-05 VITALS — BP 101/73 | HR 76 | Temp 98.0°F | Resp 18 | Ht 77.0 in | Wt 349.0 lb

## 2015-09-05 DIAGNOSIS — I83892 Varicose veins of left lower extremities with other complications: Secondary | ICD-10-CM | POA: Diagnosis not present

## 2015-09-05 NOTE — Progress Notes (Signed)
Established Venous Insufficiency   History of Present Illness  Philip [name] is a 55 y.o. (1961-02-28) male who presents with chief complaint: venous studies.  The patient's symptoms have not progressed.  The patient's symptoms are: recurrent bleeding from L ankle varicosities.  The patient is compliant with compression stockings.  He was started last month.  The patient's PMH, PSH, SH, and FamHx are unchanged from 08/13/15.  Current Outpatient Prescriptions  Medication Sig Dispense Refill  . Ascorbic Acid (VITAMIN C) 1000 MG tablet Take 1,000 mg by mouth daily.      Marland Kitchen. aspirin 81 MG tablet Take 81 mg by mouth daily.    Marland Kitchen. b complex vitamins tablet Take 1 tablet by mouth daily.    . Calcium Carbonate-Vitamin D (CALCIUM + D PO) Take 1 tablet by mouth 2 (two) times daily. 2 tabs daily    . CELEBREX 200 MG capsule TAKE ONE TABLET DAILY IN THE MORNING    . docusate sodium (COLACE) 100 MG capsule Take 1 capsule (100 mg total) by mouth 2 (two) times daily. 10 capsule 0  . furosemide (LASIX) 40 MG tablet Take 40 mg by mouth daily.    Marland Kitchen. JANUMET 50-1000 MG per tablet Take 1 tablet by mouth Twice daily.    Marland Kitchen. KLOR-CON 8 MEQ CR tablet Take 1 tablet by mouth Daily. AM    . Melatonin 3 MG CAPS Take 1 capsule by mouth at bedtime.     . metolazone (ZAROXOLYN) 5 MG tablet Take 5 mg by mouth once a week. Saturdays    . NEXIUM 40 MG capsule Take 1 tablet by mouth daily. PM    . ramipril (ALTACE) 5 MG capsule Take 5 mg by mouth Daily. PM    . simvastatin (ZOCOR) 10 MG tablet Take 10 mg by mouth at bedtime as needed.     Marland Kitchen. acetaminophen (TYLENOL) 325 MG tablet Take 2 tablets (650 mg total) by mouth every 8 (eight) hours. (Patient not taking: Reported on 09/05/2015)    . Cetirizine HCl (ZYRTEC ALLERGY PO) Take 10 mg by mouth daily. Reported on 09/05/2015    . ferrous sulfate 325 (65 FE) MG tablet Take 1 tablet (325 mg total) by mouth 3 (three) times daily after meals. (Patient not taking: Reported on 09/05/2015)   3  . methocarbamol (ROBAXIN) 500 MG tablet Take 1 tablet (500 mg total) by mouth every 6 (six) hours as needed for muscle spasms. (Patient not taking: Reported on 09/05/2015) 50 tablet 0   No current facility-administered medications for this visit.    Allergies  Allergen Reactions  . Nsaids Swelling and Other (See Comments)    NO REACTION LISTED LIPS AND NECK  . Penicillins Hives, Swelling and Other (See Comments)    NO REACTION LISTED Has patient had a PCN reaction causing immediate rash, facial/tongue/throat swelling, SOB or lightheadedness with hypotension: Yes Has patient had a PCN reaction causing severe rash involving mucus membranes or skin necrosis: No Has patient had a PCN reaction that required hospitalization No Has patient had a PCN reaction occurring within the last 10 years: No If all of the above answers are "NO", then may proceed with Cephalosporin use.  Has taken Keflex w/o issue      . Other Other (See Comments)    CYCLOOXYGENASE INHIBITORS-NO REACTION LISTED.    On ROS today: not further bleeding from L varicosities, no h/o DVT   Physical Examination  Filed Vitals:   09/05/15 1350  BP: 101/73  Pulse: 76  Temp: 98 F (36.7 C)  TempSrc: Oral  Resp: 18  Height:  (1.956 m)  Weight: 349 lb (158.305 kg)  SpO2: 97%   Body mass index is 41.38 kg/(m^2).  General: A&O x 3, WD, morbidly obese,   Pulmonary: Sym exp, good air movt, CTAB, no rales, rhonchi, & wheezing  Cardiac: RRR, Nl S1, S2, no Murmurs, rubs or gallops  Vascular: Vessel Right Left  Radial Palpable Palpable  Brachial Palpable Palpable  Carotid Palpable, without bruit Palpable, without bruit  Aorta Not palpable N/A  Femoral Palpable Palpable  Popliteal Not palpable Not palpable  PT Palpable Palpable  DP Palpable Palpable   Gastrointestinal: soft, NTND, no G/R, no HSM, - masses, no CVAT B  Musculoskeletal: M/S 5/5 throughout , Extremities without ischemic changes , mild B  LDS, BLE edema 1+, extensive spider veins and varicosities, tense bilateral calves, scab on spider vein on medial L foot  Neurologic: Pain and light touch intact in extremities , Motor exam as listed above  Non-Invasive Vascular Imaging  BLE Venous Insufficiency Duplex (Date: 09/05/2015):   RLE:   no DVT and SVT,   + GSV reflux: segmental reflux  no SSV reflux,  + deep venous reflux: CFV, SFV, PF  LLE:  no DVT and SVT,   + GSV reflux: minimal segmental reflux  no SSV,  + deep venous reflux: CFV, SFV   Medical Decision Making  Philip [name] is a 55 y.o. male who presents with: BLE leg chronic venous insufficiency (C2), recurrent bleeding L varicose veins   Based on the patient's vascular studies and examination, I have offered the patient: compressive therapy.  I discussed with the patient the use of her 20-30 mm thigh high compression stockings nd need for 3 month trial of such.  After a 3 months, he will follow up with my partners for evaluation in [facility] for evaluation of LLE stab phlebectomy vs sclerotherapy to prevent another episode bleeding from the L leg varicosities.  Thank you for allowing [place] to participate in this patient's care.   Leonides Sake, MD [facility] Office: [phone] Pager: [phone]  09/05/2015, 4:51 PM

## 2015-11-06 ENCOUNTER — Encounter: Payer: Self-pay | Admitting: Vascular Surgery

## 2015-11-11 ENCOUNTER — Encounter: Payer: Self-pay | Admitting: Vascular Surgery

## 2015-11-11 ENCOUNTER — Ambulatory Visit (INDEPENDENT_AMBULATORY_CARE_PROVIDER_SITE_OTHER): Payer: Managed Care, Other (non HMO) | Admitting: Vascular Surgery

## 2015-11-11 VITALS — BP 106/68 | HR 75 | Temp 97.4°F | Resp 18 | Ht 77.0 in | Wt 322.0 lb

## 2015-11-11 DIAGNOSIS — I83892 Varicose veins of left lower extremities with other complications: Secondary | ICD-10-CM

## 2015-11-11 NOTE — Progress Notes (Signed)
Subjective:     Patient ID: Philip [name], male   DOB: 03-09-1961, 55 y.o.   MRN: [id number]  HPI this 55 year old male with a history of multiple episodes of bleeding from reticular veins in the left ankle area returns today for continued follow-up. He was evaluated previously by Dr. Imogene Burn 3 months ago. Patient has had 7 or 8 episodes of spontaneous bleeding from the left ankle over the past 2 years. He has had another episode since he was last seen by Dr. Imogene Burn 3 months ago. He is able to stop the bleeding by compression and elevating the legs. He has no history of stasis ulcers painful varicosities or severe edema in either leg.  Past Medical History:  Diagnosis Date  . Arthritis   . Dental crowns present   . Diabetes mellitus    NIDDM  . Diabetic foot ulcer (HCC) 08/2011   right foot  . Dysrhythmia   . Fatty liver   . Gastric ulcer    no current problems  . GERD (gastroesophageal reflux disease)   . Gout   . H/O mitral valve repair   . History of bronchitis as a child   . 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 Hx of bacterial endocarditis 2005  . Hypertension    under control; states med. is really to protect kidneys due to diabetes  . Neuropathy (HCC)    feet bilat   . Obesity   . Pneumonia    history of as child   . Seasonal allergies   . Sleep apnea    uses CPAP nightly  . 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 Smoking status: Former Smoker    Packs/day: 1.00    Years: 2.00    Types: Cigars, Pipe, Cigarettes    Quit date: 01/31/2011  . Smokeless tobacco: Never Used     Comment: smokes 1-2 cigars/week  . Alcohol use 0.0 oz/week     Comment: occasionally    Family History  Problem Relation Age of Onset  . Colon cancer      family history  . Alzheimer's disease Mother   . Diabetes Father     Allergies  Allergen Reactions  . Nsaids Swelling and Other (See Comments)    NO  REACTION LISTED LIPS AND NECK  . Penicillins Hives, Swelling and Other (See Comments)    NO REACTION LISTED Has patient had a PCN reaction causing immediate rash, facial/tongue/throat swelling, SOB or lightheadedness with hypotension: Yes Has patient had a PCN reaction causing severe rash involving mucus membranes or skin necrosis: No Has patient had a PCN reaction that required hospitalization No Has patient had a PCN reaction occurring within the last 10 years: No If all of the above answers are "NO", then may proceed with Cephalosporin use.  Has taken Keflex w/o issue      . Other Other (See Comments)    CYCLOOXYGENASE INHIBITORS-NO REACTION LISTED. CYCLOOXYGENASE INHIBITORS-NO REACTION LISTED.     Current Outpatient Prescriptions:  .  acetaminophen (TYLENOL) 325 MG tablet, Take 2 tablets (650 mg total) by mouth every 8 (eight) hours., Disp: , Rfl:  .  Ascorbic Acid (VITAMIN C) 1000 MG tablet, Take 1,000 mg by mouth daily.  , Disp: , Rfl:  .  aspirin 81 MG tablet, Take 81 mg by mouth daily., Disp: , Rfl:  .  b complex vitamins  tablet, Take 1 tablet by mouth daily., Disp: , Rfl:  .  Calcium Carbonate-Vitamin D (CALCIUM + D PO), Take 1 tablet by mouth 2 (two) times daily. 2 tabs daily, Disp: , Rfl:  .  Cetirizine HCl (ZYRTEC ALLERGY PO), Take 10 mg by mouth daily. Reported on 09/05/2015, Disp: , Rfl:  .  furosemide (LASIX) 40 MG tablet, Take 40 mg by mouth daily., Disp: , Rfl:  .  JANUMET 50-1000 MG per tablet, Take 1 tablet by mouth Twice daily., Disp: , Rfl:  .  KLOR-CON 8 MEQ CR tablet, Take 1 tablet by mouth Daily. AM, Disp: , Rfl:  .  Melatonin 3 MG CAPS, Take 1 capsule by mouth at bedtime. , Disp: , Rfl:  .  methocarbamol (ROBAXIN) 500 MG tablet, Take 1 tablet (500 mg total) by mouth every 6 (six) hours as needed for muscle spasms., Disp: 50 tablet, Rfl: 0 .  metolazone (ZAROXOLYN) 5 MG tablet, Take 5 mg by mouth once a week. Saturdays, Disp: , Rfl:  .  NEXIUM 40 MG capsule,  Take 1 tablet by mouth daily. PM, Disp: , Rfl:  .  ramipril (ALTACE) 5 MG capsule, Take 5 mg by mouth Daily. PM, Disp: , Rfl:  .  simvastatin (ZOCOR) 10 MG tablet, Take 10 mg by mouth at bedtime as needed. , Disp: , Rfl:  .  CELEBREX 200 MG capsule, TAKE ONE TABLET DAILY IN THE MORNING, Disp: , Rfl:  .  docusate sodium (COLACE) 100 MG capsule, Take 1 capsule (100 mg total) by mouth 2 (two) times daily. (Patient not taking: Reported on 11/11/2015), Disp: 10 capsule, Rfl: 0 .  ferrous sulfate 325 (65 FE) MG tablet, Take 1 tablet (325 mg total) by mouth 3 (three) times daily after meals. (Patient not taking: Reported on 09/05/2015), Disp: , Rfl: 3  Vitals:   11/11/15 1104  BP: 106/68  Pulse: 75  Resp: 18  Temp: 97.4 F (36.3 C)  SpO2: 100%  Weight: (!) 322 lb (146.1 kg)  Height: 6\' 5"  (1.956 m)    Body mass index is 38.18 kg/m.          Review of Systems denies chest pain. Has history of aortic valve replacement 12 years ago has done well from that standpoint. No history of DVT or thrombophlebitis. Has had previous left knee replacement in the last 12 months.     Objective:   Physical Exam BP 106/68 (BP Location: Left Arm, Patient Position: Sitting, Cuff Size: Large)   Pulse 75   Temp 97.4 F (36.3 C)   Resp 18   Ht 6\' 5"  (1.956 m)   Wt (!) 322 lb (146.1 kg)   SpO2 100%   BMI 38.18 kg/m   Gen. obese male no apparent distress alert and oriented 3 Lungs no rhonchi or wheezing Left leg with very superficial reticular veins with small eschar overlying them adjacent to medial malleolus. Small varicosities over great saphenous system below the knee with a few spider veins. No distal edema noted. 3+ dorsalis pedis pulse palpable.  I have reviewed the results of the previous venous duplex exam which revealed no significant reflux in the great saphenous vein or small saphenous vein     Assessment:     Multiple episodes of bleeding from reticular vein left ankle-8 episodes in  past few years    Plan:     Patient needs foam sclerotherapy of these reticular veins to hopefully prevent further bleeding episodes No indication for laser ablation  of great saphenous vein We will proceed with precertification to perform this in the near future and hopefully prevent further bleeding

## 2015-12-08 ENCOUNTER — Encounter: Payer: Self-pay | Admitting: *Deleted

## 2015-12-10 ENCOUNTER — Ambulatory Visit (INDEPENDENT_AMBULATORY_CARE_PROVIDER_SITE_OTHER): Payer: Managed Care, Other (non HMO) | Admitting: *Deleted

## 2015-12-10 DIAGNOSIS — I83892 Varicose veins of left lower extremities with other complications: Secondary | ICD-10-CM | POA: Diagnosis not present

## 2015-12-10 NOTE — Progress Notes (Signed)
X=.3% Sotradecol administered with a 27g butterfly.  Patient received a total of 6cc foam.  Treated all the visible spider veins in the area where he has had frequent bleeds. Easy access. Tol well (neuropathy). Anticipate good results. Follow prn.  Photos: No.  Compression stockings applied: Yes.  4x4's and 4" ace wrap applied over the stocking.

## 2015-12-12 ENCOUNTER — Encounter: Payer: Self-pay | Admitting: Vascular Surgery

## 2015-12-17 ENCOUNTER — Encounter: Payer: Self-pay | Admitting: Cardiology

## 2015-12-30 NOTE — Progress Notes (Signed)
HPI The patient presents for followup of bioprosthetic aortic valve replacement in 2005.  He had a follow up echo in Sept 2016 that demonstrated a normal AVR.   Since I last saw him he has done well.   He did have TKR..  Since I saw him he lost 50 lbs although he gained 20 back.  The patient denies any new symptoms such as chest discomfort, neck or arm discomfort. There has been no new shortness of breath, PND or orthopnea. There have been no reported palpitations, presyncope or syncope.   Allergies  Allergen Reactions  . Nsaids Swelling and Other (See Comments)    NO REACTION LISTED LIPS AND NECK  . Penicillins Hives, Swelling and Other (See Comments)    NO REACTION LISTED Has patient had a PCN reaction causing immediate rash, facial/tongue/throat swelling, SOB or lightheadedness with hypotension: Yes Has patient had a PCN reaction causing severe rash involving mucus membranes or skin necrosis: No Has patient had a PCN reaction that required hospitalization No Has patient had a PCN reaction occurring within the last 10 years: No If all of the above answers are "NO", then may proceed with Cephalosporin use.  Has taken Keflex w/o issue      . Other Other (See Comments)    CYCLOOXYGENASE INHIBITORS-NO REACTION LISTED. CYCLOOXYGENASE INHIBITORS-NO REACTION LISTED.    Current Outpatient Prescriptions  Medication Sig Dispense Refill  . Ascorbic Acid (VITAMIN C) 1000 MG tablet Take 1,000 mg by mouth daily.      Marland Kitchen aspirin 81 MG tablet Take 81 mg by mouth daily.    Marland Kitchen b complex vitamins tablet Take 1 tablet by mouth daily.    . Calcium Carbonate-Vitamin D (CALCIUM + D PO) Take 1 tablet by mouth 2 (two) times daily. 2 tabs daily    . CELEBREX 200 MG capsule TAKE ONE TABLET DAILY IN THE MORNING    . Cetirizine HCl (ZYRTEC ALLERGY PO) Take 10 mg by mouth daily. Reported on 09/05/2015    . furosemide (LASIX) 40 MG tablet Take 40 mg by mouth daily.    Marland Kitchen JANUMET 50-1000 MG per tablet Take 1  tablet by mouth Twice daily.    Marland Kitchen KLOR-CON 8 MEQ CR tablet Take 1 tablet by mouth Daily. AM    . Melatonin 3 MG CAPS Take 1 capsule by mouth at bedtime.     . metolazone (ZAROXOLYN) 5 MG tablet Take 5 mg by mouth once a week. Saturdays    . NEXIUM 40 MG capsule Take 1 tablet by mouth daily. PM    . ramipril (ALTACE) 5 MG capsule Take 5 mg by mouth Daily. PM    . simvastatin (ZOCOR) 10 MG tablet Take 10 mg by mouth at bedtime as needed.      No current facility-administered medications for this visit.     Past Medical History:  Diagnosis Date  . Arthritis   . Dental crowns present   . Diabetes mellitus    NIDDM  . Diabetic foot ulcer (HCC) 08/2011   right foot  . Dysrhythmia   . Fatty liver   . Gastric ulcer    no current problems  . GERD (gastroesophageal reflux disease)   . Gout   . H/O mitral valve repair   . History of bronchitis as a child   . 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 Hx of bacterial endocarditis 2005  . Hypertension    under control; states med. is really to protect  kidneys due to diabetes  . Neuropathy (HCC)    feet bilat   . Obesity   . Pneumonia    history of as child   . Seasonal allergies   . Sleep apnea    uses CPAP nightly  . 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 adenoid surgery     . AORTIC VALVE REPLACEMENT  11/2003  . CARDIAC CATHETERIZATION    . GASTRIC BYPASS  03/2004  . KNEE ARTHROSCOPY  08/09/2007 - left   07/15/2003 - right  . METATARSAL OSTEOTOMY  03/20/2010   right 1st MT; gastroc soleus recession  . NASAL SINUS SURGERY    . TENDON RELEASE  09/09/2011   Procedure: HEEL CORD LENGTHENING;  Surgeon: Toni ArthursJohn Hewitt, MD;  Location: [facility];  Service: Orthopedics;  Laterality: Right;  Righ tachilles tendon lengthening  . TOE FUSION  07/12/2005   left 3rd toe PIP and DIP fusion  . TONSILLECTOMY AND ADENOIDECTOMY    .  TOTAL KNEE ARTHROPLASTY Left 03/18/2015   Procedure: LEFT TOTAL KNEE ARTHROPLASTY;  Surgeon: Durene RomansMatthew Olin, MD;  Location: [facility];  Service: Orthopedics;  Laterality: Left;    ROS:  .  As stated in the HPI and negative for all other systems.  PHYSICAL EXAM BP 114/70 (BP Location: Left Arm, Patient Position: Sitting, Cuff Size: Large)   Pulse 78   Ht 6\' 5"  (1.956 m)   Wt (!) 343 lb 4 oz (155.7 kg)   BMI 40.70 kg/m  GENERAL:  Well appearing NECK:  No jugular venous distention, waveform within normal limits, carotid upstroke brisk and symmetric, no bruits, no thyromegalyy LUNGS:  Clear to auscultation bilaterally CHEST: Well healed sternotomy scar. HEART:  PMI not displaced or sustained,S1 and S2 within normal limits, no S3, no S4, no clicks, no rubs, soft apical early peaking systolic murmur, no diastolicmurmurs ABD:  Flat, positive bowel sounds normal in frequency in pitch, no bruits, no rebound, no guarding, no midline pulsatile mass, no hepatomegaly, no splenomegaly, obese EXT:  2 plus pulses throughout, mild leg edema, no cyanosis no clubbing   EKG:  Sinus rhythm, rate 79, left axis deviation, no acute ST-T wave changes, nonspecific ST T wave changes.  01/01/2016   ASSESSMENT AND PLAN  AVR - The patient has done well since I last saw him and has a stable echo.  There is no change in history or exam.  No further testing is indicated. I will order an echo next year before I see him.   He understand SBE prophylaxis.  He has had previous endocarditis.    HTN - The blood pressure is at target. No change in medications is indicated. We will continue with therapeutic lifestyle changes (TLC).   OBESITY - The patient understands the need to lose weight with diet and exercise. We have discussed specific strategies for this.  He had bariatric surgery in the past.

## 2016-01-01 ENCOUNTER — Ambulatory Visit (INDEPENDENT_AMBULATORY_CARE_PROVIDER_SITE_OTHER): Payer: Managed Care, Other (non HMO) | Admitting: Cardiology

## 2016-01-01 ENCOUNTER — Encounter: Payer: Self-pay | Admitting: Cardiology

## 2016-01-01 VITALS — BP 114/70 | HR 78 | Ht 77.0 in | Wt 343.2 lb

## 2016-01-01 DIAGNOSIS — Z954 Presence of other heart-valve replacement: Secondary | ICD-10-CM | POA: Diagnosis not present

## 2016-01-01 DIAGNOSIS — Z952 Presence of prosthetic heart valve: Secondary | ICD-10-CM

## 2016-01-01 NOTE — Patient Instructions (Signed)
Medication Instructions:  Continue current medications  Labwork: None Ordered  Testing/Procedures: Your physician has requested that you have an echocardiogram in 1 Year. Echocardiography is a painless test that uses sound waves to create images of your heart. It provides your doctor with information about the size and shape of your heart and how well your heart's chambers and valves are working. This procedure takes approximately one hour. There are no restrictions for this procedure.   Follow-Up: Your physician wants you to follow-up in: 1 Year. You will receive a reminder letter in the mail two months in advance. If you don't receive a letter, please call our office to schedule the follow-up appointment.   Any Other Special Instructions Will Be Listed Below (If Applicable).     If you need a refill on your cardiac medications before your next appointment, please call your pharmacy.   

## 2016-11-21 DIAGNOSIS — E1142 Type 2 diabetes mellitus with diabetic polyneuropathy: Secondary | ICD-10-CM | POA: Insufficient documentation

## 2016-12-31 ENCOUNTER — Ambulatory Visit (HOSPITAL_COMMUNITY): Payer: Managed Care, Other (non HMO) | Attending: Cardiovascular Disease

## 2016-12-31 ENCOUNTER — Other Ambulatory Visit: Payer: Self-pay

## 2016-12-31 DIAGNOSIS — Z6841 Body Mass Index (BMI) 40.0 and over, adult: Secondary | ICD-10-CM | POA: Insufficient documentation

## 2016-12-31 DIAGNOSIS — Z952 Presence of prosthetic heart valve: Secondary | ICD-10-CM | POA: Diagnosis not present

## 2016-12-31 DIAGNOSIS — I340 Nonrheumatic mitral (valve) insufficiency: Secondary | ICD-10-CM | POA: Insufficient documentation

## 2016-12-31 DIAGNOSIS — I10 Essential (primary) hypertension: Secondary | ICD-10-CM | POA: Insufficient documentation

## 2016-12-31 DIAGNOSIS — E119 Type 2 diabetes mellitus without complications: Secondary | ICD-10-CM | POA: Diagnosis not present

## 2016-12-31 DIAGNOSIS — I359 Nonrheumatic aortic valve disorder, unspecified: Secondary | ICD-10-CM | POA: Diagnosis present

## 2017-01-06 ENCOUNTER — Ambulatory Visit: Payer: Managed Care, Other (non HMO) | Admitting: Cardiology

## 2017-01-12 ENCOUNTER — Encounter: Payer: Self-pay | Admitting: Cardiology

## 2017-01-20 NOTE — Progress Notes (Signed)
HPI The patient presents for followup of bioprosthetic aortic valve replacement in 2005.  He had a follow up echo in Sept.  This showed a NL bioprosthesis.   He has done well since I last saw him.  The patient denies any new symptoms such as chest discomfort, neck or arm discomfort. There has been no new shortness of breath, PND or orthopnea. There have been no reported palpitations, presyncope or syncope.  He has gained some weight back.  He does need some PRN Lasix and less frequent Zaroxolyn.     Allergies  Allergen Reactions  . Nsaids Swelling and Other (See Comments)    NO REACTION LISTED LIPS AND NECK  . Penicillins Hives, Swelling and Other (See Comments)    NO REACTION LISTED Has patient had a PCN reaction causing immediate rash, facial/tongue/throat swelling, SOB or lightheadedness with hypotension: Yes Has patient had a PCN reaction causing severe rash involving mucus membranes or skin necrosis: No Has patient had a PCN reaction that required hospitalization No Has patient had a PCN reaction occurring within the last 10 years: No If all of the above answers are "NO", then may proceed with Cephalosporin use.  Has taken Keflex w/o issue      . Other Other (See Comments)    CYCLOOXYGENASE INHIBITORS-NO REACTION LISTED. CYCLOOXYGENASE INHIBITORS-NO REACTION LISTED.    Current Outpatient Prescriptions  Medication Sig Dispense Refill  . Ascorbic Acid (VITAMIN C) 1000 MG tablet Take 1,000 mg by mouth daily.      Marland Kitchen aspirin 81 MG tablet Take 81 mg by mouth daily.    Marland Kitchen b complex vitamins tablet Take 1 tablet by mouth daily.    . Calcium Carbonate-Vitamin D (CALCIUM + D PO) Take 1 tablet by mouth 2 (two) times daily. 2 tabs daily    . Cetirizine HCl (ZYRTEC ALLERGY PO) Take 10 mg by mouth daily. Reported on 09/05/2015    . furosemide (LASIX) 40 MG tablet Take 40 mg by mouth daily.    Marland Kitchen JANUMET 50-1000 MG per tablet Take 1 tablet by mouth Twice daily.    Marland Kitchen KLOR-CON 8 MEQ CR  tablet Take 1 tablet by mouth Daily. AM    . Melatonin 3 MG CAPS Take 1 capsule by mouth at bedtime.     . metolazone (ZAROXOLYN) 5 MG tablet Take 5 mg by mouth once a week. Saturdays    . NEXIUM 40 MG capsule Take 1 tablet by mouth daily. PM    . ramipril (ALTACE) 5 MG capsule Take 5 mg by mouth Daily. PM    . simvastatin (ZOCOR) 10 MG tablet Take 10 mg by mouth at bedtime as needed.      No current facility-administered medications for this visit.     Past Medical History:  Diagnosis Date  . Arthritis   . Dental crowns present   . Diabetes mellitus    NIDDM  . Diabetic foot ulcer (HCC) 08/2011   right foot  . Dysrhythmia   . Fatty liver   . Gastric ulcer    no current problems  . GERD (gastroesophageal reflux disease)   . Gout   . H/O mitral valve repair   . History of bronchitis as a child   . 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 Hx of bacterial endocarditis 2005  . Hypertension    under control; states med. is really to protect kidneys due to diabetes  . Neuropathy    feet bilat   .  Obesity   . Pneumonia    history of as child   . Seasonal allergies   . Sleep apnea    uses CPAP nightly  . 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 adenoid surgery     . AORTIC VALVE REPLACEMENT  11/2003  . CARDIAC CATHETERIZATION    . GASTRIC BYPASS  03/2004  . KNEE ARTHROSCOPY  08/09/2007 - left   07/15/2003 - right  . METATARSAL OSTEOTOMY  03/20/2010   right 1st MT; gastroc soleus recession  . NASAL SINUS SURGERY    . TENDON RELEASE  09/09/2011   Procedure: HEEL CORD LENGTHENING;  Surgeon: Toni Arthurs, MD;  Location: [facility];  Service: Orthopedics;  Laterality: Right;  Righ tachilles tendon lengthening  . TOE FUSION  07/12/2005   left 3rd toe PIP and DIP fusion  . TONSILLECTOMY AND ADENOIDECTOMY    . TOTAL KNEE ARTHROPLASTY Left 03/18/2015   Procedure: LEFT  TOTAL KNEE ARTHROPLASTY;  Surgeon: Durene Romans, MD;  Location: [facility];  Service: Orthopedics;  Laterality: Left;    ROS:  As stated in the HPI and negative for all other systems.  PHYSICAL EXAM BP 114/74   Pulse 84   Ht  (1.956 m)   Wt (!) 361 lb 12.8 oz (164.1 kg)   BMI 42.90 kg/m   GENERAL:  Well appearing NECK:  No jugular venous distention, waveform within normal limits, carotid upstroke brisk and symmetric, no bruits, no thyromegaly LUNGS:  Clear to auscultation bilaterally CHEST:  Well healed sternotomy scar.   HEART:  PMI not displaced or sustained,S1 and S2 within normal limits, no S3, no S4, no clicks, no rubs, soft apical systolic murmur, no diastolicmurmurs ABD:  Flat, positive bowel sounds normal in frequency in pitch, no bruits, no rebound, no guarding, no midline pulsatile mass, no hepatomegaly, no splenomegaly EXT:  2 plus pulses throughout, mild edema, no cyanosis no clubbing   EKG:  Sinus rhythm, rate 84 , left axis deviation, no acute ST-T wave changes, nonspecific ST T wave changes. PACs  01/23/2017   ASSESSMENT AND PLAN  AVR - This demonstrates normal function on echo.  He understand SBE prophylaxis.  He has had previous endocarditis.    HTN - The blood pressure is at target.  No change in therapy.    OBESITY - He has gained more weight back.  We talked about this.

## 2017-01-21 ENCOUNTER — Ambulatory Visit (INDEPENDENT_AMBULATORY_CARE_PROVIDER_SITE_OTHER): Payer: Managed Care, Other (non HMO) | Admitting: Cardiology

## 2017-01-21 ENCOUNTER — Encounter: Payer: Self-pay | Admitting: Cardiology

## 2017-01-21 VITALS — BP 114/74 | HR 84 | Ht 77.0 in | Wt 361.8 lb

## 2017-01-21 DIAGNOSIS — Z952 Presence of prosthetic heart valve: Secondary | ICD-10-CM

## 2017-01-21 NOTE — Patient Instructions (Signed)
Medication Instructions:  Continue current medications  If you need a refill on your cardiac medications before your next appointment, please call your pharmacy.  Labwork: None Ordered   Testing/Procedures: None Ordered  Follow-Up: Your physician wants you to follow-up in: 1 Year. You should receive a reminder letter in the mail two months in advance. If you do not receive a letter, please call our office [phone].    Thank you for choosing [facility]!!      

## 2017-01-23 ENCOUNTER — Encounter: Payer: Self-pay | Admitting: Cardiology

## 2017-03-28 ENCOUNTER — Encounter (HOSPITAL_COMMUNITY): Payer: Self-pay

## 2017-03-28 ENCOUNTER — Inpatient Hospital Stay (HOSPITAL_COMMUNITY)
Admission: EM | Admit: 2017-03-28 | Discharge: 2017-03-31 | DRG: 377 | Disposition: A | Discharge status: Other Acute Inpatient Hospital | Payer: Managed Care, Other (non HMO) | Attending: Internal Medicine | Admitting: Internal Medicine

## 2017-03-28 ENCOUNTER — Other Ambulatory Visit: Payer: Self-pay

## 2017-03-28 DIAGNOSIS — K219 Gastro-esophageal reflux disease without esophagitis: Secondary | ICD-10-CM | POA: Diagnosis present

## 2017-03-28 DIAGNOSIS — K633 Ulcer of intestine: Secondary | ICD-10-CM | POA: Diagnosis not present

## 2017-03-28 DIAGNOSIS — D62 Acute posthemorrhagic anemia: Secondary | ICD-10-CM

## 2017-03-28 DIAGNOSIS — R710 Precipitous drop in hematocrit: Secondary | ICD-10-CM | POA: Diagnosis not present

## 2017-03-28 DIAGNOSIS — Z87891 Personal history of nicotine dependence: Secondary | ICD-10-CM

## 2017-03-28 DIAGNOSIS — Z9884 Bariatric surgery status: Secondary | ICD-10-CM | POA: Diagnosis not present

## 2017-03-28 DIAGNOSIS — D649 Anemia, unspecified: Secondary | ICD-10-CM

## 2017-03-28 DIAGNOSIS — Z7982 Long term (current) use of aspirin: Secondary | ICD-10-CM

## 2017-03-28 DIAGNOSIS — Z833 Family history of diabetes mellitus: Secondary | ICD-10-CM

## 2017-03-28 DIAGNOSIS — R0902 Hypoxemia: Secondary | ICD-10-CM | POA: Diagnosis not present

## 2017-03-28 DIAGNOSIS — Z953 Presence of xenogenic heart valve: Secondary | ICD-10-CM

## 2017-03-28 DIAGNOSIS — I5033 Acute on chronic diastolic (congestive) heart failure: Secondary | ICD-10-CM | POA: Diagnosis present

## 2017-03-28 DIAGNOSIS — Z880 Allergy status to penicillin: Secondary | ICD-10-CM

## 2017-03-28 DIAGNOSIS — I110 Hypertensive heart disease with heart failure: Secondary | ICD-10-CM | POA: Diagnosis present

## 2017-03-28 DIAGNOSIS — G4733 Obstructive sleep apnea (adult) (pediatric): Secondary | ICD-10-CM | POA: Diagnosis present

## 2017-03-28 DIAGNOSIS — K760 Fatty (change of) liver, not elsewhere classified: Secondary | ICD-10-CM | POA: Diagnosis present

## 2017-03-28 DIAGNOSIS — K921 Melena: Secondary | ICD-10-CM | POA: Diagnosis not present

## 2017-03-28 DIAGNOSIS — Z888 Allergy status to other drugs, medicaments and biological substances status: Secondary | ICD-10-CM

## 2017-03-28 DIAGNOSIS — Z9114 Patient's other noncompliance with medication regimen: Secondary | ICD-10-CM

## 2017-03-28 DIAGNOSIS — Z96652 Presence of left artificial knee joint: Secondary | ICD-10-CM | POA: Diagnosis present

## 2017-03-28 DIAGNOSIS — K922 Gastrointestinal hemorrhage, unspecified: Principal | ICD-10-CM | POA: Diagnosis present

## 2017-03-28 DIAGNOSIS — Z6841 Body Mass Index (BMI) 40.0 and over, adult: Secondary | ICD-10-CM

## 2017-03-28 DIAGNOSIS — I10 Essential (primary) hypertension: Secondary | ICD-10-CM | POA: Diagnosis not present

## 2017-03-28 DIAGNOSIS — M109 Gout, unspecified: Secondary | ICD-10-CM | POA: Diagnosis present

## 2017-03-28 DIAGNOSIS — Z980 Intestinal bypass and anastomosis status: Secondary | ICD-10-CM | POA: Diagnosis not present

## 2017-03-28 DIAGNOSIS — K648 Other hemorrhoids: Secondary | ICD-10-CM | POA: Diagnosis present

## 2017-03-28 DIAGNOSIS — E1142 Type 2 diabetes mellitus with diabetic polyneuropathy: Secondary | ICD-10-CM | POA: Diagnosis present

## 2017-03-28 DIAGNOSIS — Z952 Presence of prosthetic heart valve: Secondary | ICD-10-CM

## 2017-03-28 DIAGNOSIS — Z8711 Personal history of peptic ulcer disease: Secondary | ICD-10-CM | POA: Diagnosis not present

## 2017-03-28 DIAGNOSIS — Z79899 Other long term (current) drug therapy: Secondary | ICD-10-CM

## 2017-03-28 DIAGNOSIS — I5032 Chronic diastolic (congestive) heart failure: Secondary | ICD-10-CM

## 2017-03-28 DIAGNOSIS — E119 Type 2 diabetes mellitus without complications: Secondary | ICD-10-CM

## 2017-03-28 DIAGNOSIS — Z981 Arthrodesis status: Secondary | ICD-10-CM | POA: Diagnosis not present

## 2017-03-28 DIAGNOSIS — K254 Chronic or unspecified gastric ulcer with hemorrhage: Secondary | ICD-10-CM | POA: Diagnosis not present

## 2017-03-28 DIAGNOSIS — Z7984 Long term (current) use of oral hypoglycemic drugs: Secondary | ICD-10-CM

## 2017-03-28 DIAGNOSIS — Z713 Dietary counseling and surveillance: Secondary | ICD-10-CM

## 2017-03-28 DIAGNOSIS — Z886 Allergy status to analgesic agent status: Secondary | ICD-10-CM

## 2017-03-28 DIAGNOSIS — Z800 Family history of malignant neoplasm of digestive organs: Secondary | ICD-10-CM

## 2017-03-28 LAB — COMPREHENSIVE METABOLIC PANEL
ALBUMIN: 3.7 g/dL (ref 3.5–5.0)
ALT: 18 U/L (ref 17–63)
AST: 25 U/L (ref 15–41)
Alkaline Phosphatase: 71 U/L (ref 38–126)
Anion gap: 8 (ref 5–15)
BUN: 30 mg/dL — AB (ref 6–20)
CALCIUM: 8.9 mg/dL (ref 8.9–10.3)
CO2: 26 mmol/L (ref 22–32)
Chloride: 106 mmol/L (ref 101–111)
Creatinine, Ser: 1.03 mg/dL (ref 0.61–1.24)
GFR calc Af Amer: >60 mL/min (ref >60)
GLUCOSE: 168 mg/dL — AB (ref 65–99)
Potassium: 4.6 mmol/L (ref 3.5–5.1)
Sodium: 140 mmol/L (ref 135–145)
TOTAL PROTEIN: 7 g/dL (ref 6.5–8.1)
Total Bilirubin: 1.2 mg/dL (ref 0.3–1.2)

## 2017-03-28 LAB — CBC
HCT: 33.4 % — ABNORMAL LOW (ref 39.0–52.0)
Hemoglobin: 10.6 g/dL — ABNORMAL LOW (ref 13.0–17.0)
MCH: 28.7 pg (ref 26.0–34.0)
MCHC: 31.7 g/dL (ref 30.0–36.0)
MCV: 90.5 fL (ref 78.0–100.0)
PLATELETS: 253 10*3/uL (ref 150–400)
RBC: 3.69 MIL/uL — ABNORMAL LOW (ref 4.22–5.81)
RDW: 13.6 % (ref 11.5–15.5)
WBC: 9.9 10*3/uL (ref 4.0–10.5)

## 2017-03-28 LAB — PROTIME-INR
INR: 1.21
Prothrombin Time: 15.2 seconds (ref 11.4–15.2)

## 2017-03-28 LAB — GLUCOSE, CAPILLARY: Glucose-Capillary: 147 mg/dL — ABNORMAL HIGH (ref 65–99)

## 2017-03-28 MED ORDER — ONDANSETRON HCL 4 MG/2ML IJ SOLN: 4.0000 mg | Freq: Four times a day (QID) | INTRAMUSCULAR | Status: DC | PRN

## 2017-03-28 MED ORDER — SODIUM CHLORIDE 0.9 % IV SOLN
INTRAVENOUS | Status: DC
  Administered 2017-03-28: 22:00:00 via INTRAVENOUS

## 2017-03-28 MED ORDER — ACETAMINOPHEN 650 MG RE SUPP: 650.0000 mg | Freq: Four times a day (QID) | RECTAL | Status: DC | PRN

## 2017-03-28 MED ORDER — INSULIN ASPART 100 UNIT/ML ~~LOC~~ SOLN
0.0000 [IU] | Freq: Four times a day (QID) | SUBCUTANEOUS | Status: DC
  Administered 2017-03-28 – 2017-03-30 (×6): 2 [IU] via SUBCUTANEOUS
  Administered 2017-03-30: 5 [IU] via SUBCUTANEOUS
  Administered 2017-03-31: 3 [IU] via SUBCUTANEOUS
  Administered 2017-03-31: 5 [IU] via SUBCUTANEOUS

## 2017-03-28 MED ORDER — ACETAMINOPHEN 325 MG PO TABS: 650.0000 mg | ORAL_TABLET | Freq: Four times a day (QID) | ORAL | Status: DC | PRN

## 2017-03-28 MED ORDER — SODIUM CHLORIDE 0.9 % IV SOLN
80.0000 mg | Freq: Once | INTRAVENOUS | Status: AC
  Administered 2017-03-28: 80 mg via INTRAVENOUS
  Filled 2017-03-28: qty 80

## 2017-03-28 MED ORDER — ONDANSETRON HCL 4 MG/2ML IJ SOLN
4.0000 mg | Freq: Once | INTRAMUSCULAR | Status: AC
  Administered 2017-03-28: 4 mg via INTRAVENOUS
  Filled 2017-03-28: qty 2

## 2017-03-28 NOTE — ED Notes (Signed)
Gave report to Abby RN for assigned room 1417.

## 2017-03-28 NOTE — ED Notes (Signed)
Patient in the restroom.

## 2017-03-28 NOTE — ED Notes (Signed)
Patient reports he didn't take his Nexium for 10 days but has returned to taking his Nexium 6 days ago. Patient reports he started experiencing rectal bleeding at 14:00 today. He describes it has a dark with a red tint, then all black, and 30 minutes ago being all red.

## 2017-03-28 NOTE — ED Provider Notes (Signed)
[facility]   CSN: [id number] Arrival date & time: 03/28/17  1615     History   Chief Complaint Chief Complaint  Patient presents with  . Rectal Bleeding    HPI Philip [name] is a 56 y.o. male.  56 year old male with prior history of gastric ulcer, s/p gastric bypass, diabetes, hypertension, and mitral valve repair presents with dark, bloody stools.  Patient reports that at 2 PM this afternoon he noticed dark, tarry stools with his BM.  Reports 2-3 more BM's since then which have been become progressively more bright red in color.  Last normal BM was early today. He reports mild associated nausea. He denies vomiting.  He reports that he has not taken his Nexium for the last 10 days. He takes a daily ASA.   He denies associated chest pain, shortness of breath, lightheadedness, or vomiting.   He reports a distant history of a gastric ulcer.  This was treated about 12 years ago.  He is followed by Dr. Marina GoodellPerry of [facility].    The history is provided by the patient.  Rectal Bleeding  Quality:  Black and tarry and bright red Amount:  Moderate Duration:  3 hours Timing:  Constant Chronicity:  New Similar prior episodes: yes   Relieved by:  Nothing Worsened by:  Nothing Ineffective treatments:  None tried Associated symptoms: no abdominal pain and no fever     Past Medical History:  Diagnosis Date  . Arthritis   . Dental crowns present   . Diabetes mellitus    NIDDM  . Diabetic foot ulcer (HCC) 08/2011   right foot  . Dysrhythmia   . Fatty liver   . Gastric ulcer    no current problems  . GERD (gastroesophageal reflux disease)   . Gout   . H/O mitral valve repair   . History of bronchitis as a child   . 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 Hx of bacterial endocarditis 2005  . Hypertension    under control; states med. is really to protect kidneys due to diabetes  . Neuropathy    feet bilat   . Obesity   .  Pneumonia    history of as child   . Seasonal allergies   . Sleep apnea    uses CPAP nightly  . 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 Toe deformity 08/2011   right claw hallux    Patient Active Problem List   Diagnosis Date Noted  . Varicose veins of left lower extremity with complications 08/13/2015  . Chronic venous insufficiency 08/13/2015  . Morbid obesity (HCC) 03/19/2015  . S/P left TKA 03/18/2015  . S/P knee replacement 03/18/2015  . 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 S/P AVR (aortic valve replacement) 01/15/2011  . Overweight(278.02) 01/15/2011  . DM 08/17/2007  . ESOPHAGEAL ULCER, WITH BLEEDING 08/17/2007  . OTHER CHRONIC NONALCOHOLIC LIVER DISEASE 08/17/2007  . Calculus of gallbladder without mention of cholecystitis or obstruction 08/17/2007  . GASTRIC ULCER, ACUTE, HEMORRHAGE 07/08/2004    Past Surgical History:  Procedure Laterality Date  . adenoid surgery     . AORTIC VALVE REPLACEMENT  11/2003  . CARDIAC CATHETERIZATION    . GASTRIC BYPASS  03/2004  . KNEE ARTHROSCOPY  08/09/2007 - left   07/15/2003 - right  . METATARSAL OSTEOTOMY  03/20/2010   right 1st MT; gastroc soleus recession  . NASAL SINUS SURGERY    . TENDON RELEASE  09/09/2011   Procedure: HEEL CORD LENGTHENING;  Surgeon: Toni ArthursJohn Hewitt, MD;  Location: [facility];  Service: Orthopedics;  Laterality: Right;  Righ tachilles tendon lengthening  . TOE FUSION  07/12/2005   left 3rd toe PIP and DIP fusion  . TONSILLECTOMY AND ADENOIDECTOMY    . TOTAL KNEE ARTHROPLASTY Left 03/18/2015   Procedure: LEFT TOTAL KNEE ARTHROPLASTY;  Surgeon: Durene RomansMatthew Olin, MD;  Location: [facility];  Service: Orthopedics;  Laterality: Left;       Home Medications    Prior to Admission medications   Medication Sig Start Date End Date Taking? Authorizing Provider  Ascorbic Acid (VITAMIN C) 1000 MG tablet Take 1,000 mg by mouth daily.      [provider]  aspirin 81 MG tablet Take 81 mg by mouth daily.     [provider]  b complex vitamins tablet Take 1 tablet by mouth daily.    [provider]  Calcium Carbonate-Vitamin D (CALCIUM + D PO) Take 1 tablet by mouth 2 (two) times daily. 2 tabs daily    [provider]  Cetirizine HCl (ZYRTEC ALLERGY PO) Take 10 mg by mouth daily. Reported on 09/05/2015    [provider]  furosemide (LASIX) 40 MG tablet Take 40 mg by mouth daily.    [provider]  JANUMET 50-1000 MG per tablet Take 1 tablet by mouth Twice daily. 11/24/10   [provider]  KLOR-CON 8 MEQ CR tablet Take 1 tablet by mouth Daily. AM 11/23/10   [provider]  Melatonin 3 MG CAPS Take 1 capsule by mouth at bedtime.     [provider]  metolazone (ZAROXOLYN) 5 MG tablet Take 5 mg by mouth once a week. Saturdays    [provider]  NEXIUM 40 MG capsule Take 1 tablet by mouth daily. PM 12/24/10   [provider]  ramipril (ALTACE) 5 MG capsule Take 5 mg by mouth Daily. PM 11/23/10   [provider]  simvastatin (ZOCOR) 10 MG tablet Take 10 mg by mouth at bedtime as needed.     [provider]    Family History Family History  Problem Relation Age of Onset  . Alzheimer's disease Mother   . Diabetes Father   . Colon cancer Unknown        family history    Social History Social History   Tobacco Use  . Smoking status: Former Smoker    Packs/day: 1.00    Years: 2.00    Pack years: 2.00    Types: Cigars, Pipe, Cigarettes    Last attempt to quit: 01/31/2011    Years since quitting: 6.1  . Smokeless tobacco: Never Used  . Tobacco comment: smokes 1-2 cigars/week  Substance Use Topics  . Alcohol use: Yes    Alcohol/week: 0.0 oz    Comment: occasionally  . Drug use: No     Allergies   Nsaids; Penicillins; and Other   Review of Systems Review of Systems  Constitutional: Negative for fever.  Gastrointestinal: Positive for hematochezia. Negative for abdominal pain.  All  other systems reviewed and are negative.    Physical Exam Updated Vital Signs BP 119/75 (BP Location: Left Arm)   Pulse (!) 103   Temp 98.1 F (36.7 C) (Oral)   Resp 20   Ht 6\' 5"  (1.956 m)   Wt (!) 163.3 kg (360 lb)   SpO2 97%   BMI 42.69 kg/m   Physical Exam  Constitutional: He  is oriented to person, place, and time. He appears well-developed and well-nourished. No distress.  HENT:  Head: Normocephalic and atraumatic.  Mouth/Throat: Oropharynx is clear and moist.  Eyes: Conjunctivae and EOM are normal. Pupils are equal, round, and reactive to light.  Neck: Normal range of motion. Neck supple.  Cardiovascular: Normal rate, regular rhythm and normal heart sounds.  Pulmonary/Chest: Effort normal and breath sounds normal. No respiratory distress.  Abdominal: Soft. He exhibits no distension. There is no tenderness.  Musculoskeletal: Normal range of motion. He exhibits no edema or deformity.  Neurological: He is alert and oriented to person, place, and time.  Skin: Skin is warm and dry.  Psychiatric: He has a normal mood and affect.  Nursing note and vitals reviewed.    ED Treatments / Results  Labs (all labs ordered are listed, but only abnormal results are displayed) Labs Reviewed  COMPREHENSIVE METABOLIC PANEL  CBC  POC OCCULT BLOOD, ED  TYPE AND SCREEN    EKG  EKG Interpretation None       Radiology No results found.  Procedures Procedures (including critical care time)  Medications Ordered in ED Medications  pantoprazole (PROTONIX) 80 mg in sodium chloride 0.9 % 100 mL IVPB (not administered)  ondansetron (ZOFRAN) injection 4 mg (not administered)     Initial Impression / Assessment and Plan / ED Course  I have reviewed the triage vital signs and the nursing notes.  Pertinent labs & imaging results that were available during my care of the patient were reviewed by me and considered in my medical decision making (see chart for details).   1730  [facility] - Dr Marina Goodell paged.   1745 Case is discussed with Dr. Marina Goodell..  Plans to perform endoscopy tomorrow morning. Hospitalist service to admit.   1840 Hospitalist aware of case.     MSE complete   Presentation and exam are consistent with GI bleed.  Patient will be admitted for further workup by GI.  Patient is comfortable at time of ED evaluation.  The hospitalist service is aware of the case and will evaluate for admission.  GI is also aware of the case and will, most likely, perform endoscopy tomorrow.   Final Clinical Impressions(s) / ED Diagnoses   Final diagnoses:  Gastrointestinal hemorrhage, unspecified gastrointestinal hemorrhage type    ED Discharge Orders    None       Wynetta Fines, MD 03/28/17 [phone]

## 2017-03-28 NOTE — ED Notes (Signed)
Provided patient clear chicken broth and ginger ale.

## 2017-03-28 NOTE — H&P (Signed)
History and Physical    Philip [name] XLK:[id number] DOB: 1960/11/25 DOA: 03/28/2017  PCP: [name], MD Patient coming from: home  Chief Complaint: rectal bleeding  HPI: Philip [name] is a 56 y.o. male with medical history significant of bioprosthetic aortic valve replacement in 2005, gastric bypass in 2005, diabetes type 2, essential hypertension, peripheral neuropathy, GERD came to the hospital with complaints of rectal bleeding.  Patient states since his gastric bypass in 2005 he had one episode of GI bleeding at that time but none in the last 13 years.  About 2 weeks ago he forgot to take his Nexium and did not take it for about 10 days, he started taking it about a week ago but then this afternoon he started experiencing epigastric abdominal pain.  States his pain started after eating hamburger and he also felt nauseous at that time.  Soon after he had 4-5 episodes of dark maroon colored large bowel movement.  Couple of them were also black and tarry.  Due to this he came to the ER for further evaluation. He denied any chest pain, shortness of breath, lightheadedness, dizziness and other complaints. In the ER overall his vital signs were stable besides very mild tachycardia and to low 100s heart rate.  His BUN was 30, creatinine 1, hemoglobin 10.6 (baseline between 14 per outpatient records).  He was started on Protonix drip which immediately improved his epigastric abdominal pain and nausea.  Gastroenterology was notified who will see the patient tomorrow.  Patient follows with Dr. Marina Goodell from [facility].   Due to his family history of colon cancer he has been having colonoscopy since he was 30 every 5 years which has been negative.  His last colonoscopy was about 6 years ago and was told he will not need one until 10 years.  He is not due for another one in 4 years.  According to him his last colonoscopy was unremarkable.   Review of Systems: As per HPI otherwise 10 point review of  systems negative.   Past Medical History:  Diagnosis Date  . Arthritis   . Dental crowns present   . Diabetes mellitus    NIDDM  . Diabetic foot ulcer (HCC) 08/2011   right foot  . Dysrhythmia   . Fatty liver   . Gastric ulcer    no current problems  . GERD (gastroesophageal reflux disease)   . Gout   . H/O mitral valve repair   . History of bronchitis as a child   . 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 Hx of bacterial endocarditis 2005  . Hypertension    under control; states med. is really to protect kidneys due to diabetes  . Neuropathy    feet bilat   . Obesity   . Pneumonia    history of as child   . Seasonal allergies   . Sleep apnea    uses CPAP nightly  . 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 adenoid surgery     . AORTIC VALVE REPLACEMENT  11/2003  . CARDIAC CATHETERIZATION    . GASTRIC BYPASS  03/2004  . KNEE ARTHROSCOPY  08/09/2007 - left   07/15/2003 - right  . METATARSAL OSTEOTOMY  03/20/2010   right 1st MT; gastroc soleus recession  . NASAL SINUS SURGERY    . TENDON RELEASE  09/09/2011  Procedure: HEEL CORD LENGTHENING;  Surgeon: Toni Arthurs, MD;  Location: [facility];  Service: Orthopedics;  Laterality: Right;  Righ tachilles tendon lengthening  . TOE FUSION  07/12/2005   left 3rd toe PIP and DIP fusion  . TONSILLECTOMY AND ADENOIDECTOMY    . TOTAL KNEE ARTHROPLASTY Left 03/18/2015   Procedure: LEFT TOTAL KNEE ARTHROPLASTY;  Surgeon: Durene Romans, MD;  Location: [facility];  Service: Orthopedics;  Laterality: Left;     reports that he quit smoking about 6 years ago. His smoking use included cigars, pipe, and cigarettes. He has a 2.00 pack-year smoking history. he has never used smokeless tobacco. He reports that he drinks alcohol. He reports that he does not use drugs.  Allergies  Allergen Reactions  . Nsaids Swelling and Other (See  Comments)    NO REACTION LISTED LIPS AND NECK  . Penicillins Hives, Swelling and Other (See Comments)    NO REACTION LISTED Has patient had a PCN reaction causing immediate rash, facial/tongue/throat swelling, SOB or lightheadedness with hypotension: Yes Has patient had a PCN reaction causing severe rash involving mucus membranes or skin necrosis: No Has patient had a PCN reaction that required hospitalization No Has patient had a PCN reaction occurring within the last 10 years: No If all of the above answers are "NO", then may proceed with Cephalosporin use.  Has taken Keflex w/o issue      . Other Other (See Comments)    CYCLOOXYGENASE INHIBITORS-NO REACTION LISTED. CYCLOOXYGENASE INHIBITORS-NO REACTION LISTED.    Family History  Problem Relation Age of Onset  . Alzheimer's disease Mother   . Diabetes Father   . Colon cancer Unknown        family history    Acceptable: Family history reviewed and not pertinent (If you reviewed it)  Prior to Admission medications   Medication Sig Start Date End Date Taking? Authorizing Provider  Ascorbic Acid (VITAMIN C) 1000 MG tablet Take 1,000 mg by mouth daily.      [provider]  aspirin 81 MG tablet Take 81 mg by mouth daily.    [provider]  b complex vitamins tablet Take 1 tablet by mouth daily.    [provider]  Calcium Carbonate-Vitamin D (CALCIUM + D PO) Take 1 tablet by mouth 2 (two) times daily. 2 tabs daily    [provider]  Cetirizine HCl (ZYRTEC ALLERGY PO) Take 10 mg by mouth daily. Reported on 09/05/2015    [provider]  furosemide (LASIX) 40 MG tablet Take 40 mg by mouth daily.    [provider]  JANUMET 50-1000 MG per tablet Take 1 tablet by mouth Twice daily. 11/24/10   [provider]  KLOR-CON 8 MEQ CR tablet Take 1 tablet by mouth Daily. AM 11/23/10   [provider]  Melatonin 3 MG CAPS Take 1 capsule by mouth at bedtime.     [provider]  metolazone (ZAROXOLYN) 5 MG tablet Take 5 mg by mouth once a week. Saturdays    [provider]  NEXIUM 40 MG capsule Take 1 tablet by mouth daily. PM 12/24/10   [provider]  ramipril (ALTACE) 5 MG capsule Take 5 mg by mouth Daily. PM 11/23/10   [provider]  simvastatin (ZOCOR) 10 MG tablet Take 10 mg by mouth at bedtime as needed.     [provider]    Physical Exam: Vitals:   03/28/17 1629  BP: 119/75  Pulse: Marland Kitchen)  103  Resp: 20  Temp: 98.1 F (36.7 C)  TempSrc: Oral  SpO2: 97%  Weight: (!) 163.3 kg (360 lb)  Height: 6\' 5"  (1.956 m)      Constitutional: NAD, calm, comfortable Vitals:   03/28/17 1629  BP: 119/75  Pulse: (!) 103  Resp: 20  Temp: 98.1 F (36.7 C)  TempSrc: Oral  SpO2: 97%  Weight: (!) 163.3 kg (360 lb)  Height: 6\' 5"  (1.956 m)   Eyes: PERRL, lids and conjunctivae normal ENMT: Mucous membranes are moist. Posterior pharynx clear of any exudate or lesions.Normal dentition.  Neck: normal, supple, no masses, no thyromegaly Respiratory: clear to auscultation bilaterally, no wheezing, no crackles. Normal respiratory effort. No accessory muscle use.  Cardiovascular: Regular rate and rhythm, no murmurs / rubs / gallops. No extremity edema. 2+ pedal pulses. No carotid bruits.  Abdomen: no tenderness, no masses palpated. No hepatosplenomegaly. Bowel sounds positive.  Musculoskeletal: no clubbing / cyanosis. No joint deformity upper and lower extremities. Good ROM, no contractures. Normal muscle tone.  Skin: no rashes, lesions, ulcers. No induration Neurologic: CN 2-12 grossly intact. Sensation intact, DTR normal. Strength 5/5 in all 4.  Psychiatric: Normal judgment and insight. Alert and oriented x 3. Normal mood.     Labs on Admission: I have personally reviewed following labs and imaging studies  CBC: Recent Labs  Lab 03/28/17 1655  WBC 9.9  HGB 10.6*  HCT 33.4*  MCV 90.5  PLT 253   Basic  Metabolic Panel: Recent Labs  Lab 03/28/17 1655  NA 140  K 4.6  CL 106  CO2 26  GLUCOSE 168*  BUN 30*  CREATININE 1.03  CALCIUM 8.9   GFR: Estimated Creatinine Clearance: 136.2 mL/min (by C-G formula based on SCr of 1.03 mg/dL). Liver Function Tests: Recent Labs  Lab 03/28/17 1655  AST 25  ALT 18  ALKPHOS 71  BILITOT 1.2  PROT 7.0  ALBUMIN 3.7   No results for input(s): LIPASE, AMYLASE in the last 168 hours. No results for input(s): AMMONIA in the last 168 hours. Coagulation Profile: Recent Labs  Lab 03/28/17 1655  INR 1.21   Cardiac Enzymes: No results for input(s): CKTOTAL, CKMB, CKMBINDEX, TROPONINI in the last 168 hours. BNP (last 3 results) No results for input(s): PROBNP in the last 8760 hours. HbA1C: No results for input(s): HGBA1C in the last 72 hours. CBG: No results for input(s): GLUCAP in the last 168 hours. Lipid Profile: No results for input(s): CHOL, HDL, LDLCALC, TRIG, CHOLHDL, LDLDIRECT in the last 72 hours. Thyroid Function Tests: No results for input(s): TSH, T4TOTAL, FREET4, T3FREE, THYROIDAB in the last 72 hours. Anemia Panel: No results for input(s): VITAMINB12, FOLATE, FERRITIN, TIBC, IRON, RETICCTPCT in the last 72 hours. Urine analysis:    Component Value Date/Time   COLORURINE YELLOW 03/10/2015 0831   APPEARANCEUR CLEAR 03/10/2015 0831   LABSPEC 1.009 03/10/2015 0831   PHURINE 6.5 03/10/2015 0831   GLUCOSEU NEGATIVE 03/10/2015 0831   HGBUR NEGATIVE 03/10/2015 0831   BILIRUBINUR NEGATIVE 03/10/2015 0831   KETONESUR NEGATIVE 03/10/2015 0831   PROTEINUR NEGATIVE 03/10/2015 0831   UROBILINOGEN 1.0 08/08/2007 1244   NITRITE NEGATIVE 03/10/2015 0831   LEUKOCYTESUR NEGATIVE 03/10/2015 0831   Sepsis Labs: !!!!!!!!!!!!!!!!!!!!!!!!!!!!!!!!!!!!!!!!!!!! @LABRCNTIP (procalcitonin:4,lacticidven:4) )No results found for this or any previous visit (from the past 240 hour(s)).   Radiological Exams on Admission: No results found.  EKG:  Independently reviewed.   Assessment/Plan Active Problems:   GI bleed   GI bleed with epigastric abdominal pain  Acute blood loss anemia - Admit to inpatient telemetry for closer monitoring -Hemoglobin today is 10.6, baseline is 14 according to outpatient records from January 2018 - Given his history of gastric bypass and previous ulcer this is high suspicion for upper GI bleed.  BUN is only slightly elevated at 30 -Protonix drip IV (resolved his epigastric pain), check type and screen -Clear liquid diet at this time, n.p.o. past midnight for tentative EGD tomorrow - Gastroenterology-Dr. Marina GoodellPerry Notified by the ER physician -Closely monitor CBC, transfuse if hemoglobin less than 8 -Hold off on antihypertensives and other oral medicines at this time -Provide supportive care  Bioprosthetic aortic valve replacement in 2005 -On aspirin 81 orally daily, on hold at this time  Essential hypertension - Holding home medications  History of gastric bypass in 2005  Diabetes type 2 Peripheral neuropathy -Holding oral diabetic meds while he is n.p.o., Accu-Chek and sliding scale every 6 hours  Obesity Obstructive sleep apnea -Not necessarily compliant with his CPAP, can use it if necessary - Counseled on weight loss diet and exercise   DVT prophylaxis: SCDs due to GI bleed Code Status: Full code Family Communication: Son at bedside Disposition Plan: To be determined Consults called: Gastroenterology, Dr. Marina GoodellPerry notified by the ER.  [facility] Admission status: Inpatient telemetry   Ankit Joline Maxcyhirag Amin MD [facility]   If 7PM-7AM, please contact night-coverage [email or web address] Password [email or web address]  03/28/2017, 7:15 PM

## 2017-03-28 NOTE — ED Notes (Signed)
Patient had large bowel movement with large amount of blood noted while in triage.

## 2017-03-28 NOTE — ED Triage Notes (Signed)
Patient states he has been 8 days without Nexium. Patient states he had a black loose with bright red in the water, 20 minutes later all black loose stool, and then an all bright red stool prior to  Coming to the ED.

## 2017-03-29 ENCOUNTER — Inpatient Hospital Stay (HOSPITAL_COMMUNITY): Payer: Managed Care, Other (non HMO)

## 2017-03-29 ENCOUNTER — Encounter (HOSPITAL_COMMUNITY)
Admission: EM | Disposition: A | Discharge status: Other Acute Inpatient Hospital | Payer: Self-pay | Source: Home / Self Care | Attending: Internal Medicine

## 2017-03-29 ENCOUNTER — Inpatient Hospital Stay (HOSPITAL_COMMUNITY): Payer: Managed Care, Other (non HMO) | Admitting: Anesthesiology

## 2017-03-29 ENCOUNTER — Encounter (HOSPITAL_COMMUNITY): Payer: Self-pay | Admitting: *Deleted

## 2017-03-29 DIAGNOSIS — Z9884 Bariatric surgery status: Secondary | ICD-10-CM

## 2017-03-29 DIAGNOSIS — K922 Gastrointestinal hemorrhage, unspecified: Principal | ICD-10-CM

## 2017-03-29 DIAGNOSIS — D62 Acute posthemorrhagic anemia: Secondary | ICD-10-CM

## 2017-03-29 DIAGNOSIS — Z952 Presence of prosthetic heart valve: Secondary | ICD-10-CM

## 2017-03-29 DIAGNOSIS — R710 Precipitous drop in hematocrit: Secondary | ICD-10-CM

## 2017-03-29 DIAGNOSIS — K921 Melena: Secondary | ICD-10-CM

## 2017-03-29 DIAGNOSIS — Z980 Intestinal bypass and anastomosis status: Secondary | ICD-10-CM

## 2017-03-29 HISTORY — PX: ESOPHAGOGASTRODUODENOSCOPY (EGD) WITH PROPOFOL: SHX5813

## 2017-03-29 LAB — APTT: APTT: 32 s (ref 24–36)

## 2017-03-29 LAB — COMPREHENSIVE METABOLIC PANEL
ALBUMIN: 3.4 g/dL — AB (ref 3.5–5.0)
ALT: 16 U/L — AB (ref 17–63)
AST: 19 U/L (ref 15–41)
Alkaline Phosphatase: 61 U/L (ref 38–126)
Anion gap: 5 (ref 5–15)
BUN: 31 mg/dL — AB (ref 6–20)
CHLORIDE: 106 mmol/L (ref 101–111)
CO2: 28 mmol/L (ref 22–32)
CREATININE: 0.99 mg/dL (ref 0.61–1.24)
Calcium: 8.1 mg/dL — ABNORMAL LOW (ref 8.9–10.3)
GFR calc Af Amer: >60 mL/min (ref >60)
GFR calc non Af Amer: >60 mL/min (ref >60)
GLUCOSE: 142 mg/dL — AB (ref 65–99)
POTASSIUM: 4 mmol/L (ref 3.5–5.1)
Sodium: 139 mmol/L (ref 135–145)
Total Bilirubin: 1 mg/dL (ref 0.3–1.2)
Total Protein: 6 g/dL — ABNORMAL LOW (ref 6.5–8.1)

## 2017-03-29 LAB — CBC
HCT: 26.5 % — ABNORMAL LOW (ref 39.0–52.0)
HEMOGLOBIN: 8.5 g/dL — AB (ref 13.0–17.0)
MCH: 29.1 pg (ref 26.0–34.0)
MCHC: 32.1 g/dL (ref 30.0–36.0)
MCV: 90.8 fL (ref 78.0–100.0)
PLATELETS: 224 10*3/uL (ref 150–400)
RBC: 2.92 MIL/uL — AB (ref 4.22–5.81)
RDW: 13.9 % (ref 11.5–15.5)
WBC: 8.4 10*3/uL (ref 4.0–10.5)

## 2017-03-29 LAB — PROTIME-INR
INR: 1.3
PROTHROMBIN TIME: 16.1 s — AB (ref 11.4–15.2)

## 2017-03-29 LAB — GLUCOSE, CAPILLARY
GLUCOSE-CAPILLARY: 149 mg/dL — AB (ref 65–99)
Glucose-Capillary: 128 mg/dL — ABNORMAL HIGH (ref 65–99)
Glucose-Capillary: 144 mg/dL — ABNORMAL HIGH (ref 65–99)
Glucose-Capillary: 148 mg/dL — ABNORMAL HIGH (ref 65–99)
Glucose-Capillary: 114 mg/dL — ABNORMAL HIGH (ref 65–99)
Glucose-Capillary: 145 mg/dL — ABNORMAL HIGH (ref 65–99)

## 2017-03-29 LAB — HEMOGLOBIN AND HEMATOCRIT, BLOOD
HCT: 25.8 % — ABNORMAL LOW (ref 39.0–52.0)
HCT: 26.4 % — ABNORMAL LOW (ref 39.0–52.0)
HEMOGLOBIN: 8.5 g/dL — AB (ref 13.0–17.0)
Hemoglobin: 8.3 g/dL — ABNORMAL LOW (ref 13.0–17.0)

## 2017-03-29 LAB — OCCULT BLOOD X 1 CARD TO LAB, STOOL: FECAL OCCULT BLD: POSITIVE — AB

## 2017-03-29 SURGERY — ESOPHAGOGASTRODUODENOSCOPY (EGD) WITH PROPOFOL
Anesthesia: Monitor Anesthesia Care

## 2017-03-29 MED ORDER — PANTOPRAZOLE SODIUM 40 MG IV SOLR
40.0000 mg | Freq: Every day | INTRAVENOUS | Status: DC
  Administered 2017-03-29 – 2017-03-31 (×3): 40 mg via INTRAVENOUS
  Filled 2017-03-29 (×3): qty 40

## 2017-03-29 MED ORDER — IOPAMIDOL (ISOVUE-370) INJECTION 76%
100.0000 mL | Freq: Once | INTRAVENOUS | Status: AC | PRN
  Administered 2017-03-29: 100 mL via INTRAVENOUS

## 2017-03-29 MED ORDER — LACTATED RINGERS IV SOLN
INTRAVENOUS | Status: DC
  Administered 2017-03-30: 01:00:00 via INTRAVENOUS

## 2017-03-29 MED ORDER — PROPOFOL 10 MG/ML IV BOLUS
INTRAVENOUS | Status: AC
  Filled 2017-03-29: qty 40

## 2017-03-29 MED ORDER — SODIUM CHLORIDE 0.9 % IV SOLN
INTRAVENOUS | Status: DC | PRN
  Administered 2017-03-29: 11:00:00 via INTRAVENOUS

## 2017-03-29 MED ORDER — SUCCINYLCHOLINE CHLORIDE 200 MG/10ML IV SOSY
PREFILLED_SYRINGE | INTRAVENOUS | Status: DC | PRN
  Administered 2017-03-29: 120 mg via INTRAVENOUS

## 2017-03-29 MED ORDER — SODIUM CHLORIDE 0.9 % IV SOLN
8.0000 mg/h | INTRAVENOUS | Status: DC
  Administered 2017-03-29: 8 mg/h via INTRAVENOUS
  Filled 2017-03-29 (×2): qty 80

## 2017-03-29 MED ORDER — PROPOFOL 500 MG/50ML IV EMUL
INTRAVENOUS | Status: DC | PRN
  Administered 2017-03-29: 150 ug/kg/min via INTRAVENOUS

## 2017-03-29 MED ORDER — IOPAMIDOL (ISOVUE-370) INJECTION 76%
INTRAVENOUS | Status: AC
  Filled 2017-03-29: qty 100

## 2017-03-29 MED ORDER — ONDANSETRON HCL 4 MG/2ML IJ SOLN
INTRAMUSCULAR | Status: DC | PRN
  Administered 2017-03-29: 4 mg via INTRAVENOUS

## 2017-03-29 MED ORDER — PROPOFOL 10 MG/ML IV BOLUS
INTRAVENOUS | Status: AC
  Filled 2017-03-29: qty 20

## 2017-03-29 MED ORDER — PROPOFOL 10 MG/ML IV BOLUS
INTRAVENOUS | Status: DC | PRN
  Administered 2017-03-29: 200 mg via INTRAVENOUS
  Administered 2017-03-29 (×2): 20 mg via INTRAVENOUS

## 2017-03-29 MED ORDER — PEG-KCL-NACL-NASULF-NA ASC-C 100 G PO SOLR
0.5000 | Freq: Two times a day (BID) | ORAL | Status: AC
  Administered 2017-03-29 (×2): 100 g via ORAL
  Filled 2017-03-29: qty 1

## 2017-03-29 MED ORDER — FUROSEMIDE 10 MG/ML IJ SOLN
20.0000 mg | Freq: Once | INTRAMUSCULAR | Status: AC
  Administered 2017-03-29: 20 mg via INTRAVENOUS
  Filled 2017-03-29: qty 2

## 2017-03-29 MED ORDER — LIDOCAINE 2% (20 MG/ML) 5 ML SYRINGE
INTRAMUSCULAR | Status: DC | PRN
  Administered 2017-03-29: 75 mg via INTRAVENOUS

## 2017-03-29 MED ORDER — SODIUM CHLORIDE 0.9 % IV BOLUS (SEPSIS): 1000.0000 mL | INTRAVENOUS | Status: DC | PRN

## 2017-03-29 MED ORDER — PANTOPRAZOLE SODIUM 40 MG IV SOLR
40.0000 mg | Freq: Once | INTRAVENOUS | Status: AC
  Administered 2017-03-29: 40 mg via INTRAVENOUS
  Filled 2017-03-29: qty 40

## 2017-03-29 SURGICAL SUPPLY — 15 items

## 2017-03-29 NOTE — Transfer of Care (Signed)
Immediate Anesthesia Transfer of Care Note  Patient: Trenton Gammonlan M Cookson  Procedure(s) Performed: ESOPHAGOGASTRODUODENOSCOPY (EGD) WITH PROPOFOL (N/A )  Patient Location: PACU  Anesthesia Type:General  Level of Consciousness: awake, alert  and oriented  Airway & Oxygen Therapy: Patient Spontanous Breathing and Patient connected to face mask oxygen  Post-op Assessment: Report given to RN and Post -op Vital signs reviewed and stable  Post vital signs: Reviewed and stable  Last Vitals:  Vitals:   03/29/17 1045 03/29/17 1218  BP: 133/79 111/74  Pulse: 92 (!) 112  Resp: 12 17  Temp: 36.9 C (P) 36.4 C  SpO2: 99% 98%    Last Pain:  Vitals:   03/29/17 1045  TempSrc: Oral  PainSc:          Complications: No apparent anesthesia complications

## 2017-03-29 NOTE — Progress Notes (Signed)
  GI UPDATE  Patient had multiple episodes of bleeding overnight with drop in Hgb.  EGD today showed normal surgical anastomosis, no pathology to account for his bleeding. No blood in the lumen. CT angio then performed which was unremarkable for any active bleeding or other pathology. He has either stopped bleeding or bleeding very slowly based on CT angio.   At this point I'm recommending a colonoscopy to clear his lower tract. He can have clear liquid diet now and with his bowel prep this evening. Plan for colonoscopy tomorrow. If he has active bleeding again overnight, I would recommend a tagged RBC scan which may be more sensitive for localization.   Please call with questions, we will follow him closely.  Ileene PatrickSteven Tiran Sauseda, MD [facility] Pager [phone]

## 2017-03-29 NOTE — Progress Notes (Signed)
Patient had one large bloody formed BM at shift change this AM.  No c/o dizziness, weakness, SOB, or other.  Will continue to monitor.

## 2017-03-29 NOTE — Anesthesia Preprocedure Evaluation (Addendum)
Anesthesia Evaluation  Patient identified by MRN, date of birth, ID band Patient awake    Reviewed: Allergy & Precautions, H&P , Patient's Chart, lab work & pertinent test results, reviewed documented beta blocker date and time   Airway Mallampati: II  TM Distance: >3 FB Neck ROM: full    Dental no notable dental hx.    Pulmonary sleep apnea , former smoker,    Pulmonary exam normal breath sounds clear to auscultation       Cardiovascular hypertension,  Rhythm:regular Rate:Normal     Neuro/Psych    GI/Hepatic   Endo/Other  diabetesMorbid obesity  Renal/GU      Musculoskeletal   Abdominal   Peds  Hematology  (+) anemia ,   Anesthesia Other Findings   Reproductive/Obstetrics                            Anesthesia Physical Anesthesia Plan  ASA: III  Anesthesia Plan: MAC   Post-op Pain Management:    Induction: Intravenous  PONV Risk Score and Plan:   Airway Management Planned: Mask and Natural Airway  Additional Equipment:   Intra-op Plan:   Post-operative Plan:   Informed Consent: I have reviewed the patients History and Physical, chart, labs and discussed the procedure including the risks, benefits and alternatives for the proposed anesthesia with the patient or authorized representative who has indicated his/her understanding and acceptance.   Dental Advisory Given  Plan Discussed with: CRNA and Surgeon  Anesthesia Plan Comments:        Anesthesia Quick Evaluation

## 2017-03-29 NOTE — H&P (View-Only) (Signed)
Consultation  Referring Provider:     Dr. Nelson [name] Primary Care Physician:  Philip [name] Primary Gastroenterologist:        Dr. Yancey FlemingsJohn [name] Reason for Consultation:     GI bleed         HPI:   Philip [name] is a 56 y.o. male with history of gastric bypass with reported marginal ulcer, presenting with symptoms concerning for upper GI bleed. He reports he was in his usual state of health when he passed a black bowel movement yesterday x 2 around 2 PM. Since that time he has passed additional bowel movements which are a mix of black and red blood. He had some epigastric pain yesterday but it was relieved with Protonix given to him in the ED. He otherwise denies much abdominal pain at present time. He states his current presentation is much worse than his prior GI bleed several years ago. His gastric bypass was done in 2005. He takes nexium at baseline, but ran out for about 8 days or so recently. He takes a baby aspirin but denies any other NSAIDs. Hgb dropped from 10.6 on admission to 8.5 this morning. BUN rising to 31 at present. He's had a prior colonoscopy in 2011 which was normal. He does not remember his last EGD, he thinks several years ago after gastric bypass.   Past Medical History:  Diagnosis Date  . Arthritis   . Dental crowns present   . Diabetes mellitus    NIDDM  . Diabetic foot ulcer (HCC) 08/2011   right foot  . Dysrhythmia   . Fatty liver   . Gastric ulcer    no current problems  . GERD (gastroesophageal reflux disease)   . Gout   . H/O mitral valve repair   . History of bronchitis as a child   . 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 Hx of bacterial endocarditis 2005  . Hypertension    under control; states med. is really to protect kidneys due to diabetes  . Neuropathy    feet bilat   . Obesity   . Pneumonia    history of as child   . Seasonal allergies   . Sleep apnea    uses CPAP nightly  . 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 Toe deformity  08/2011   right claw hallux    Past Surgical History:  Procedure Laterality Date  . adenoid surgery     . AORTIC VALVE REPLACEMENT  11/2003  . CARDIAC CATHETERIZATION    . GASTRIC BYPASS  03/2004  . KNEE ARTHROSCOPY  08/09/2007 - left   07/15/2003 - right  . METATARSAL OSTEOTOMY  03/20/2010   right 1st MT; gastroc soleus recession  . NASAL SINUS SURGERY    . TENDON RELEASE  09/09/2011   Procedure: HEEL CORD LENGTHENING;  Surgeon: Toni ArthursJohn Hewitt, [name];  Location: [facility];  Service: Orthopedics;  Laterality: Right;  Righ tachilles tendon lengthening  . TOE FUSION  07/12/2005   left 3rd toe PIP and DIP fusion  . TONSILLECTOMY AND ADENOIDECTOMY    . TOTAL KNEE ARTHROPLASTY Left 03/18/2015   Procedure: LEFT TOTAL KNEE ARTHROPLASTY;  Surgeon: Durene RomansMatthew Olin, [name];  Location: [facility];  Service: Orthopedics;  Laterality: Left;    Family History  Problem Relation Age of Onset  . Alzheimer's disease Mother   . Diabetes Father   . Colon cancer Unknown        family history  Social History   Tobacco Use  . Smoking status: Former Smoker    Packs/day: 1.00    Years: 2.00    Pack years: 2.00    Types: Cigars, Pipe, Cigarettes    Last attempt to quit: 01/31/2011    Years since quitting: 6.1  . Smokeless tobacco: Never Used  . Tobacco comment: smokes 1-2 cigars/week  Substance Use Topics  . Alcohol use: Yes    Alcohol/week: 0.0 oz    Comment: occasionally  . Drug use: No    Prior to Admission medications   Medication Sig Start Date End Date Taking? Authorizing Provider  Ascorbic Acid (VITAMIN C) 1000 MG tablet Take 1,000 mg by mouth daily.     Yes Provider, Historical, [name]  aspirin 81 MG tablet Take 81 mg by mouth daily.   Yes Provider, Historical, [name]  b complex vitamins tablet Take 1 tablet by mouth daily.   Yes Provider, Historical, [name]  Calcium Carbonate-Vitamin D (CALCIUM + D PO) Take 1 tablet by mouth 2 (two) times daily. 2 tabs daily   Yes Provider, Historical, [name]    Cetirizine HCl (ZYRTEC ALLERGY PO) Take 10 mg by mouth daily. Reported on 09/05/2015   Yes Provider, Historical, [name]  furosemide (LASIX) 40 MG tablet Take 40 mg by mouth daily.   Yes Provider, Historical, [name]  JANUMET 50-1000 MG per tablet Take 1 tablet by mouth Twice daily. 11/24/10  Yes Provider, Historical, [name]  KLOR-CON 8 MEQ CR tablet Take 1 tablet by mouth Daily. AM 11/23/10  Yes Provider, Historical, [name]  Melatonin 3 MG CAPS Take 1 capsule by mouth at bedtime.    Yes Provider, Historical, [name]  NEXIUM 40 MG capsule Take 1 tablet by mouth daily. PM 12/24/10  Yes Provider, Historical, [name]  ramipril (ALTACE) 5 MG capsule Take 5 mg by mouth Daily. PM 11/23/10  Yes Provider, Historical, [name]  simvastatin (ZOCOR) 10 MG tablet Take 10 mg by mouth at bedtime.    Yes Provider, Historical, [name]  metolazone (ZAROXOLYN) 5 MG tablet Take 5 mg by mouth once a week. Saturdays    Provider, Historical, [name]    Current Facility-Administered Medications  Medication Dose Route Frequency Provider Last Rate Last Dose  . 0.9 %  sodium chloride infusion   Intravenous Continuous Amin, Philip Chirag, [name] 75 mL/hr at 03/28/17 2200    . acetaminophen (TYLENOL) tablet 650 mg  650 mg Oral Q6H PRN Amin, Philip Chirag, [name]       Or  . acetaminophen (TYLENOL) suppository 650 mg  650 mg Rectal Q6H PRN Amin, Philip Chirag, [name]      . insulin aspart (novoLOG) injection 0-15 Units  0-15 Units Subcutaneous Q6H Amin, Philip Halt, [name]   2 Units at 03/29/17 0456  . ondansetron (ZOFRAN) injection 4 mg  4 mg Intravenous Q6H PRN Amin, Philip Chirag, [name]      . pantoprazole (PROTONIX) 80 mg in sodium chloride 0.9 % 250 mL (0.32 mg/mL) infusion  8 mg/hr Intravenous Continuous Armbruster, Philip Rayas, [name]      . pantoprazole (PROTONIX) injection 40 mg  40 mg Intravenous Once Armbruster, Philip Rayas, [name]        Allergies as of 03/28/2017 - Review Complete 03/28/2017  Allergen Reaction Noted  . Nsaids Swelling and Other (See Comments) 11/06/2009  . Penicillins Hives,  Swelling, and Other (See Comments) 01/15/2011  . Other Other (See Comments) 08/13/2015     Review of Systems:    As per HPI, otherwise negative  Physical Exam:  Vital signs in last 24 hours: Temp:  [98 F (36.7 C)-98.2 F (36.8 C)] 98.2 F (36.8 C) (12/11 0450) Pulse Rate:  [90-103] 99 (12/11 0450) Resp:  [20-22] 20 (12/11 0450) BP: (98-119)/(65-75) 99/69 (12/11 0450) SpO2:  [97 %-100 %] 100 % (12/11 0450) Weight:  [359 lb 4.8 oz (163 kg)-360 lb (163.3 kg)] 359 lb 4.8 oz (163 kg) (12/10 2130) Last BM Date: 03/28/17 General:   Pleasant male in NAD Head:  Normocephalic and atraumatic. Eyes:   No icterus.   Conjunctiva pink. Ears:  Normal auditory acuity. Neck:  Supple Lungs:  Respirations even and unlabored. Lungs clear to auscultation bilaterally.    Heart:  Regular rate and rhythm; no MRG Abdomen:  Soft, protuberant, nontender.  No appreciable masses or hepatomegaly.  Rectal:  Not performed.  Msk:  Symmetrical without gross deformities.  Extremities:  Without edema. Neurologic:  Alert and  oriented x4;  grossly normal neurologically. Skin:  Intact without significant lesions or rashes. Psych:  Alert and cooperative. Normal affect.  LAB RESULTS: Recent Labs    03/28/17 1655 03/29/17 0521  WBC 9.9 8.4  HGB 10.6* 8.5*  HCT 33.4* 26.5*  PLT 253 224   BMET Recent Labs    03/28/17 1655 03/29/17 0521  NA 140 139  K 4.6 4.0  CL 106 106  CO2 26 28  GLUCOSE 168* 142*  BUN 30* 31*  CREATININE 1.03 0.99  CALCIUM 8.9 8.1*   LFT Recent Labs    03/29/17 0521  PROT 6.0*  ALBUMIN 3.4*  AST 19  ALT 16*  ALKPHOS 61  BILITOT 1.0   PT/INR Recent Labs    03/28/17 1655 03/29/17 0521  LABPROT 15.2 16.1*  INR 1.21 1.30    STUDIES: No results found.   PREVIOUS ENDOSCOPIES:               Impression / Plan:  56 y/o male with a history of obesity, s/p gastric bypass with reported marginal ulcer, presenting with symptoms of upper GI bleed. Hgb dropping, BUN  rising. Black stools initially, now with more reddish color. He is hemodynamically stable, states he feels better since getting protonix, but passed another bowel movement around 6 AM (5 total since yesterday). Other than baby aspirin, denies any other NSAID use, ran out of nexium at home recently.  Recommend EGD this morning to further evaluate and provide hemostasis if needed. I have discussed risks / benefits of EGD and anesthesia with him and he was agreeable to proceed. This is tentatively scheduled for 1115 AM. I have ordered more protonix in the interim and please keep him NPO. Trend Hgb and transfuse as needed. Moving forward, given his gastric bypass state he needs to open capsules prior to ingestion to maximize absorption.   Further recommendations pending EGD result and his course. He agreed with the plan and verbalized understanding.   Ileene PatrickSteven Armbruster, [name] [facility] Pager [phone]

## 2017-03-29 NOTE — Progress Notes (Signed)
Patient to Endo for EGD.

## 2017-03-29 NOTE — Consult Note (Signed)
   Consultation  Referring Provider:     Dr. Amin Primary Care Physician:  Aguiar, Rafaela M, MD Primary Gastroenterologist:        Dr. John Perry Reason for Consultation:     GI bleed         HPI:   Philip [name] is a 55 y.o. male with history of gastric bypass with reported marginal ulcer, presenting with symptoms concerning for upper GI bleed. He reports he was in his usual state of health when he passed a black bowel movement yesterday x 2 around 2 PM. Since that time he has passed additional bowel movements which are a mix of black and red blood. He had some epigastric pain yesterday but it was relieved with Protonix given to him in the ED. He otherwise denies much abdominal pain at present time. He states his current presentation is much worse than his prior GI bleed several years ago. His gastric bypass was done in 2005. He takes nexium at baseline, but ran out for about 8 days or so recently. He takes a baby aspirin but denies any other NSAIDs. Hgb dropped from 10.6 on admission to 8.5 this morning. BUN rising to 31 at present. He's had a prior colonoscopy in 2011 which was normal. He does not remember his last EGD, he thinks several years ago after gastric bypass.   Past Medical History:  Diagnosis Date  . Arthritis   . Dental crowns present   . Diabetes mellitus    NIDDM  . Diabetic foot ulcer (HCC) 08/2011   right foot  . Dysrhythmia   . Fatty liver   . Gastric ulcer    no current problems  . GERD (gastroesophageal reflux disease)   . Gout   . H/O mitral valve repair   . History of bronchitis as a child   . 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 Hx of bacterial endocarditis 2005  . Hypertension    under control; states med. is really to protect kidneys due to diabetes  . Neuropathy    feet bilat   . Obesity   . Pneumonia    history of as child   . Seasonal allergies   . Sleep apnea    uses CPAP nightly  . 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 Toe deformity  08/2011   right claw hallux    Past Surgical History:  Procedure Laterality Date  . adenoid surgery     . AORTIC VALVE REPLACEMENT  11/2003  . CARDIAC CATHETERIZATION    . GASTRIC BYPASS  03/2004  . KNEE ARTHROSCOPY  08/09/2007 - left   07/15/2003 - right  . METATARSAL OSTEOTOMY  03/20/2010   right 1st MT; gastroc soleus recession  . NASAL SINUS SURGERY    . TENDON RELEASE  09/09/2011   Procedure: HEEL CORD LENGTHENING;  Surgeon: John Hewitt, MD;  Location: [facility];  Service: Orthopedics;  Laterality: Right;  Righ tachilles tendon lengthening  . TOE FUSION  07/12/2005   left 3rd toe PIP and DIP fusion  . TONSILLECTOMY AND ADENOIDECTOMY    . TOTAL KNEE ARTHROPLASTY Left 03/18/2015   Procedure: LEFT TOTAL KNEE ARTHROPLASTY;  Surgeon: Matthew Olin, MD;  Location: [facility];  Service: Orthopedics;  Laterality: Left;    Family History  Problem Relation Age of Onset  . Alzheimer's disease Mother   . Diabetes Father   . Colon cancer Unknown        family history       Social History   Tobacco Use  . Smoking status: Former Smoker    Packs/day: 1.00    Years: 2.00    Pack years: 2.00    Types: Cigars, Pipe, Cigarettes    Last attempt to quit: 01/31/2011    Years since quitting: 6.1  . Smokeless tobacco: Never Used  . Tobacco comment: smokes 1-2 cigars/week  Substance Use Topics  . Alcohol use: Yes    Alcohol/week: 0.0 oz    Comment: occasionally  . Drug use: No    Prior to Admission medications   Medication Sig Start Date End Date Taking? Authorizing Provider  Ascorbic Acid (VITAMIN C) 1000 MG tablet Take 1,000 mg by mouth daily.     Yes [provider]  aspirin 81 MG tablet Take 81 mg by mouth daily.   Yes [provider]  b complex vitamins tablet Take 1 tablet by mouth daily.   Yes [provider]  Calcium Carbonate-Vitamin D (CALCIUM + D PO) Take 1 tablet by mouth 2 (two) times daily. 2 tabs daily   Yes [provider]    Cetirizine HCl (ZYRTEC ALLERGY PO) Take 10 mg by mouth daily. Reported on 09/05/2015   Yes [provider]  furosemide (LASIX) 40 MG tablet Take 40 mg by mouth daily.   Yes [provider]  JANUMET 50-1000 MG per tablet Take 1 tablet by mouth Twice daily. 11/24/10  Yes [provider]  KLOR-CON 8 MEQ CR tablet Take 1 tablet by mouth Daily. AM 11/23/10  Yes [provider]  Melatonin 3 MG CAPS Take 1 capsule by mouth at bedtime.    Yes [provider]  NEXIUM 40 MG capsule Take 1 tablet by mouth daily. PM 12/24/10  Yes [provider]  ramipril (ALTACE) 5 MG capsule Take 5 mg by mouth Daily. PM 11/23/10  Yes [provider]  simvastatin (ZOCOR) 10 MG tablet Take 10 mg by mouth at bedtime.    Yes [provider]  metolazone (ZAROXOLYN) 5 MG tablet Take 5 mg by mouth once a week. Saturdays    [provider]    Current Facility-Administered Medications  Medication Dose Route Frequency Provider Last Rate Last Dose  . 0.9 %  sodium chloride infusion   Intravenous Continuous Amin, Ankit Chirag, MD 75 mL/hr at 03/28/17 2200    . acetaminophen (TYLENOL) tablet 650 mg  650 mg Oral Q6H PRN Amin, Ankit Chirag, MD       Or  . acetaminophen (TYLENOL) suppository 650 mg  650 mg Rectal Q6H PRN Amin, Ankit Chirag, MD      . insulin aspart (novoLOG) injection 0-15 Units  0-15 Units Subcutaneous Q6H Amin, Ankit Chirag, MD   2 Units at 03/29/17 0456  . ondansetron (ZOFRAN) injection 4 mg  4 mg Intravenous Q6H PRN Amin, Ankit Chirag, MD      . pantoprazole (PROTONIX) 80 mg in sodium chloride 0.9 % 250 mL (0.32 mg/mL) infusion  8 mg/hr Intravenous Continuous Olusegun Gerstenberger P, MD      . pantoprazole (PROTONIX) injection 40 mg  40 mg Intravenous Once Buena Boehm P, MD        Allergies as of 03/28/2017 - Review Complete 03/28/2017  Allergen Reaction Noted  . Nsaids Swelling and Other (See Comments) 11/06/2009  . Penicillins Hives,  Swelling, and Other (See Comments) 01/15/2011  . Other Other (See Comments) 08/13/2015     Review of Systems:    As per HPI, otherwise negative      Physical Exam:  Vital signs in last 24 hours: Temp:  [98 F (36.7 C)-98.2 F (36.8 C)] 98.2 F (36.8 C) (12/11 0450) Pulse Rate:  [90-103] 99 (12/11 0450) Resp:  [20-22] 20 (12/11 0450) BP: (98-119)/(65-75) 99/69 (12/11 0450) SpO2:  [97 %-100 %] 100 % (12/11 0450) Weight:  [359 lb 4.8 oz (163 kg)-360 lb (163.3 kg)] 359 lb 4.8 oz (163 kg) (12/10 2130) Last BM Date: 03/28/17 General:   Pleasant male in NAD Head:  Normocephalic and atraumatic. Eyes:   No icterus.   Conjunctiva pink. Ears:  Normal auditory acuity. Neck:  Supple Lungs:  Respirations even and unlabored. Lungs clear to auscultation bilaterally.    Heart:  Regular rate and rhythm; no MRG Abdomen:  Soft, protuberant, nontender.  No appreciable masses or hepatomegaly.  Rectal:  Not performed.  Msk:  Symmetrical without gross deformities.  Extremities:  Without edema. Neurologic:  Alert and  oriented x4;  grossly normal neurologically. Skin:  Intact without significant lesions or rashes. Psych:  Alert and cooperative. Normal affect.  LAB RESULTS: Recent Labs    03/28/17 1655 03/29/17 0521  WBC 9.9 8.4  HGB 10.6* 8.5*  HCT 33.4* 26.5*  PLT 253 224   BMET Recent Labs    03/28/17 1655 03/29/17 0521  NA 140 139  K 4.6 4.0  CL 106 106  CO2 26 28  GLUCOSE 168* 142*  BUN 30* 31*  CREATININE 1.03 0.99  CALCIUM 8.9 8.1*   LFT Recent Labs    03/29/17 0521  PROT 6.0*  ALBUMIN 3.4*  AST 19  ALT 16*  ALKPHOS 61  BILITOT 1.0   PT/INR Recent Labs    03/28/17 1655 03/29/17 0521  LABPROT 15.2 16.1*  INR 1.21 1.30    STUDIES: No results found.   PREVIOUS ENDOSCOPIES:               Impression / Plan:  55 y/o male with a history of obesity, s/p gastric bypass with reported marginal ulcer, presenting with symptoms of upper GI bleed. Hgb dropping, BUN  rising. Black stools initially, now with more reddish color. He is hemodynamically stable, states he feels better since getting protonix, but passed another bowel movement around 6 AM (5 total since yesterday). Other than baby aspirin, denies any other NSAID use, ran out of nexium at home recently.  Recommend EGD this morning to further evaluate and provide hemostasis if needed. I have discussed risks / benefits of EGD and anesthesia with him and he was agreeable to proceed. This is tentatively scheduled for 1115 AM. I have ordered more protonix in the interim and please keep him NPO. Trend Hgb and transfuse as needed. Moving forward, given his gastric bypass state he needs to open capsules prior to ingestion to maximize absorption.   Further recommendations pending EGD result and his course. He agreed with the plan and verbalized understanding.   Kherington Meraz, MD [facility] Pager [phone]  

## 2017-03-29 NOTE — Interval H&P Note (Signed)
History and Physical Interval Note:  03/29/2017 11:04 AM  Philip [name]  has presented today for surgery, with the diagnosis of upper GI bleed  The various methods of treatment have been discussed with the patient and family. After consideration of risks, benefits and other options for treatment, the patient has consented to  Procedure(s): ESOPHAGOGASTRODUODENOSCOPY (EGD) WITH PROPOFOL (N/A) as a surgical intervention .  The patient's history has been reviewed, patient examined, no change in status, stable for surgery.  I have reviewed the patient's chart and labs.  Questions were answered to the patient's satisfaction.     Viviann SpareSteven P Aaryana Betke

## 2017-03-29 NOTE — Op Note (Signed)
[facility] Patient Name: Philip [name] Procedure Date: 03/29/2017 MRN: [id number] Attending MD: Carlota Raspberry. Ziyan Hillmer MD, MD Date of Birth: [birth year] CSN: [id number] Age: 56 Admit Type: Inpatient Procedure:                Upper GI endoscopy Indications:              Melena, Suspected upper gastrointestinal bleeding,                            history of gastric bypass Providers:                Remo Lipps P. Sloka Volante MD, MD, Elmer Ramp. Tilden Dome, RN,                            Tinnie Gens, Technician, Edman Circle. Zenia Resides CRNA, CRNA Referring MD:              Medicines:                Monitored Anesthesia Care Complications:            : The patient had acute hypoxia managed per                            anesthesia, the scope was withdrawn mid way through                            the case so this could be managed. The patient was                            electively intubated in this light to complete the                            procedure. Estimated Blood Loss:     Estimated blood loss: none. Estimated blood loss:                            none. Procedure:                Pre-Anesthesia Assessment:                           - Prior to the procedure, a History and Physical                            was performed, and patient medications and                            allergies were reviewed. The patient's tolerance of                            previous anesthesia was also reviewed. The risks                            and benefits of the procedure and the sedation  options and risks were discussed with the patient.                            All questions were answered, and informed consent                            was obtained. Prior Anticoagulants: The patient has                            taken no previous anticoagulant or antiplatelet                            agents. ASA Grade Assessment: III - A patient with   severe systemic disease. After reviewing the risks                            and benefits, the patient was deemed in                            satisfactory condition to undergo the procedure.                           After obtaining informed consent, the endoscope was                            passed under direct vision. Throughout the                            procedure, the patient's blood pressure, pulse, and                            oxygen saturations were monitored continuously. The                            Endoscope was introduced through the mouth, and                            advanced to the jejunum. The upper GI endoscopy was                            accomplished without difficulty. The patient                            tolerated the procedure. Scope In: Scope Out: Findings:      Esophagogastric landmarks were identified: the Z-line was found at 44       cm, the gastroesophageal junction was found at 44 cm and the upper       extent of the gastric folds was found at 44 cm from the incisors.      The exam of the esophagus was otherwise normal.      Evidence of a Roux-en-Y gastrojejunostomy was found. The gastrojejunal       anastomosis was characterized by healthy appearing mucosa. The gastric       pouch was normal. No focal ulceration or source of  bleeding noted. Blind       pouch was normal.      The examined jejunal limb was deeply intubated and was normal. No blood       in the lumen Impression:               - Esophagogastric landmarks identified.                           - Normal esophagus                           - Roux-en-Y gastrojejunostomy with gastrojejunal                            anastomosis characterized by healthy appearing                            mucosa.                           - Normal gastric pouch                           - Normal examined jejunal limb.                           - No blood noted anywhere in the lumen of the bowel.                            Overall, no pathology noted to account for the                            patient's bleeding on this exam, suspect small                            bowel or colonic bleed. Moderate Sedation:      No moderate sedation, case performed with MAC Recommendation:           - Return patient to hospital ward for ongoing care                            once recovered from anesthesia.                           - NPO.                           - Continue present medications.                           - CT angiogram of the abdomen - evaluate for active                            small bowel or colonic bleed. If this is negative,                            patient will need  a colonoscopy                           - Repeat CBC now, transfuse PRBC if needed                           - GI service will continue to follow Procedure Code(s):        --- Professional ---                           [phone], Esophagogastroduodenoscopy, flexible,                            transoral; diagnostic, including collection of                            specimen(s) by brushing or washing, when performed                            (separate procedure) Diagnosis Code(s):        --- Professional ---                           Z98.0, Intestinal bypass and anastomosis status                           K92.1, Melena (includes Hematochezia) CPT copyright 2016 American Medical Association. All rights reserved. The codes documented in this report are preliminary and upon coder review may  be revised to meet current compliance requirements. Remo Lipps P. Alycia Cooperwood MD, MD 03/29/2017 12:12:25 PM This report has been signed electronically. Number of Addenda: 0

## 2017-03-29 NOTE — Progress Notes (Signed)
Pt has home CPAP and prefers self placement.  RT to monitor and assess as needed.  

## 2017-03-29 NOTE — Progress Notes (Signed)
 @IPLOG @        PROGRESS NOTE                                                                                                                                                                                                             Patient Demographics:    Philip [name], is a 56 y.o. male, DOB - [birth year], ZOX:[id number]  Admit date - 03/28/2017   Admitting Physician Ankit Joline Maxcyhirag Amin, MD  Outpatient Primary MD for the patient is Angelica ChessmanAguiar, Rafaela M, MD  LOS - 1  Chief Complaint  Patient presents with  . Rectal Bleeding       Brief Narrative  Philip [name] is a 56 y.o. male with medical history significant of bioprosthetic aortic valve replacement in 2005, gastric bypass in 2005, diabetes type 2, essential hypertension, peripheral neuropathy, GERD came to the hospital with complaints of rectal bleeding, he has had issues with upper GI bleeding 12-13 years ago.  Since then he has been stable on Nexium however he accidentally missed taking his Nexium for the last week after which he started developing some epigastric discomfort and now bleeding per rectum.     Subjective:    Philip [name] today has, No headache, No chest pain, No abdominal pain - No Nausea, No new weakness tingling or numbness, No Cough - SOB. Not dizzy.   Assessment  & Plan :     1.  Acute upper GI bleed related blood loss anemia.  On IV PPI, H&H has dropped and will be monitored closely, type screen done, transfuse if he becomes symptomatic or drops below 7.5, continue bowel rest, GI on board due for EGD later today.  2.  History of bioprosthetic aortic valve replacement.  No acute issues.  No anticoagulation needed.  3.  Street of gastric bypass surgery in the past.  Supportive care.  4.  Hypertension and dyslipidemia.  Oral medications including statin, ACE inhibitor, diuretics on hold.  Monitor blood pressure and clinically.  Resume medications in the next 1-2 days if stable.    Diet : Diet NPO time specified     Family Communication  :  Son  Code Status :  Full  Disposition Plan  :  Stay inpt  Consults  :  GI  Procedures  :    For EGD on 03/29/2017  DVT Prophylaxis  :   SCDs    Lab Results  Component Value Date   PLT 224 03/29/2017    Inpatient Medications  Scheduled Meds: . [facility][MAR Hold]  insulin aspart  0-15 Units Subcutaneous Q6H   Continuous Infusions: . pantoprozole (PROTONIX) infusion 8 mg/hr (03/29/17 1017)  . sodium chloride     PRN Meds:.[MAR Hold] acetaminophen **OR** [MAR Hold] acetaminophen, [MAR Hold] ondansetron (ZOFRAN) IV, sodium chloride  Antibiotics  :    Anti-infectives (From admission, onward)   None         Objective:   Vitals:   03/28/17 1629 03/28/17 2005 03/28/17 2130 03/29/17 0450  BP: 119/75 98/71 111/65 99/69  Pulse: (!) 103 97 90 99  Resp: 20 (!) 22  20  Temp: 98.1 F (36.7 C) 98.1 F (36.7 C) 98 F (36.7 C) 98.2 F (36.8 C)  TempSrc: Oral Oral Oral Oral  SpO2: 97% 100% 100% 100%  Weight: (!) 163.3 kg (360 lb)  (!) 163 kg (359 lb 4.8 oz)   Height: 6\' 5"  (1.956 m)  6\' 5"  (1.956 m)     Wt Readings from Last 3 Encounters:  03/28/17 (!) 163 kg (359 lb 4.8 oz)  01/21/17 (!) 164.1 kg (361 lb 12.8 oz)  01/01/16 (!) 155.7 kg (343 lb 4 oz)     Intake/Output Summary (Last 24 hours) at 03/29/2017 1042 Last data filed at 03/29/2017 0557 Gross per 24 hour  Intake 596.25 ml  Output 350 ml  Net 246.25 ml     Physical Exam  Awake Alert, Oriented X 3, No new F.N deficits, Normal affect [place].AT,PERRAL Supple Neck,No JVD, No cervical lymphadenopathy appriciated.  Symmetrical Chest wall movement, Good air movement bilaterally, CTAB RRR,No Gallops,Rubs or new Murmurs, No Parasternal Heave +ve B.Sounds, Abd Soft, No tenderness, No organomegaly appriciated, No rebound - guarding or rigidity. No Cyanosis, Clubbing or edema, No new Rash or bruise       Data Review:    CBC Recent Labs  Lab 03/28/17 1655 03/29/17 0521  WBC 9.9 8.4   HGB 10.6* 8.5*  HCT 33.4* 26.5*  PLT 253 224  MCV 90.5 90.8  MCH 28.7 29.1  MCHC 31.7 32.1  RDW 13.6 13.9    Chemistries  Recent Labs  Lab 03/28/17 1655 03/29/17 0521  NA 140 139  K 4.6 4.0  CL 106 106  CO2 26 28  GLUCOSE 168* 142*  BUN 30* 31*  CREATININE 1.03 0.99  CALCIUM 8.9 8.1*  AST 25 19  ALT 18 16*  ALKPHOS 71 61  BILITOT 1.2 1.0   ------------------------------------------------------------------------------------------------------------------ No results for input(s): CHOL, HDL, LDLCALC, TRIG, CHOLHDL, LDLDIRECT in the last 72 hours.  Lab Results  Component Value Date   HGBA1C 7.8 (H) 03/18/2015   ------------------------------------------------------------------------------------------------------------------ No results for input(s): TSH, T4TOTAL, T3FREE, THYROIDAB in the last 72 hours.  Invalid input(s): FREET3 ------------------------------------------------------------------------------------------------------------------ No results for input(s): VITAMINB12, FOLATE, FERRITIN, TIBC, IRON, RETICCTPCT in the last 72 hours.  Coagulation profile Recent Labs  Lab 03/28/17 1655 03/29/17 0521  INR 1.21 1.30    No results for input(s): DDIMER in the last 72 hours.  Cardiac Enzymes No results for input(s): CKMB, TROPONINI, MYOGLOBIN in the last 168 hours.  Invalid input(s): CK ------------------------------------------------------------------------------------------------------------------ No results found for: BNP  Micro Results No results found for this or any previous visit (from the past 240 hour(s)).  Radiology Reports No results found.  Time Spent in minutes  30   Susa RaringPrashant Singh M.D on 03/29/2017 at 10:42 AM  Between 7am to 7pm - Pager - [phone] ( page via [email or web address], text pages only, please mention full 10 digit call back number). After 7pm go to [email or web address] - password  [email or web address]

## 2017-03-29 NOTE — Anesthesia Procedure Notes (Signed)
Procedure Name: Intubation Date/Time: 03/29/2017 11:50 AM Performed by: Elyn PeersAllen, Damiana Berrian J, CRNA Pre-anesthesia Checklist: Patient identified, Emergency Drugs available, Suction available, Patient being monitored and Timeout performed Patient Re-evaluated:Patient Re-evaluated prior to induction Oxygen Delivery Method: Circle system utilized Preoxygenation: Pre-oxygenation with 100% oxygen Induction Type: IV induction, Rapid sequence and Cricoid Pressure applied Grade View: Grade I Tube type: Oral Tube size: 7.5 mm Number of attempts: 1 Airway Equipment and Method: Stylet and Video-laryngoscopy Placement Confirmation: ETT inserted through vocal cords under direct vision,  positive ETCO2 and breath sounds checked- equal and bilateral Secured at: 23 cm Tube secured with: Tape Dental Injury: Teeth and Oropharynx as per pre-operative assessment

## 2017-03-30 ENCOUNTER — Encounter (HOSPITAL_COMMUNITY): Payer: Self-pay | Admitting: *Deleted

## 2017-03-30 ENCOUNTER — Inpatient Hospital Stay (HOSPITAL_COMMUNITY): Payer: Managed Care, Other (non HMO) | Admitting: Anesthesiology

## 2017-03-30 ENCOUNTER — Encounter (HOSPITAL_COMMUNITY)
Admission: EM | Disposition: A | Discharge status: Other Acute Inpatient Hospital | Payer: Self-pay | Source: Home / Self Care | Attending: Internal Medicine

## 2017-03-30 ENCOUNTER — Encounter: Payer: Self-pay | Admitting: Gastroenterology

## 2017-03-30 DIAGNOSIS — D62 Acute posthemorrhagic anemia: Secondary | ICD-10-CM

## 2017-03-30 DIAGNOSIS — K633 Ulcer of intestine: Secondary | ICD-10-CM

## 2017-03-30 HISTORY — PX: GIVENS CAPSULE STUDY: SHX5432

## 2017-03-30 HISTORY — PX: COLONOSCOPY WITH PROPOFOL: SHX5780

## 2017-03-30 LAB — CBC
HEMATOCRIT: 25.3 % — AB (ref 39.0–52.0)
HEMOGLOBIN: 8.2 g/dL — AB (ref 13.0–17.0)
MCH: 29.6 pg (ref 26.0–34.0)
MCHC: 32.4 g/dL (ref 30.0–36.0)
MCV: 91.3 fL (ref 78.0–100.0)
Platelets: 203 10*3/uL (ref 150–400)
RBC: 2.77 MIL/uL — ABNORMAL LOW (ref 4.22–5.81)
RDW: 14.1 % (ref 11.5–15.5)
WBC: 10.8 10*3/uL — ABNORMAL HIGH (ref 4.0–10.5)

## 2017-03-30 LAB — BASIC METABOLIC PANEL
Anion gap: 7 (ref 5–15)
BUN: 22 mg/dL — AB (ref 6–20)
CHLORIDE: 108 mmol/L (ref 101–111)
CO2: 23 mmol/L (ref 22–32)
CREATININE: 1.03 mg/dL (ref 0.61–1.24)
Calcium: 8 mg/dL — ABNORMAL LOW (ref 8.9–10.3)
GFR calc Af Amer: >60 mL/min (ref >60)
GFR calc non Af Amer: >60 mL/min (ref >60)
GLUCOSE: 124 mg/dL — AB (ref 65–99)
Potassium: 3.9 mmol/L (ref 3.5–5.1)
Sodium: 138 mmol/L (ref 135–145)

## 2017-03-30 LAB — GLUCOSE, CAPILLARY
GLUCOSE-CAPILLARY: 125 mg/dL — AB (ref 65–99)
GLUCOSE-CAPILLARY: 143 mg/dL — AB (ref 65–99)
Glucose-Capillary: 242 mg/dL — ABNORMAL HIGH (ref 65–99)

## 2017-03-30 LAB — HEMOGLOBIN AND HEMATOCRIT, BLOOD
HEMATOCRIT: 25.3 % — AB (ref 39.0–52.0)
HEMATOCRIT: 24.5 % — AB (ref 39.0–52.0)
HEMOGLOBIN: 8 g/dL — AB (ref 13.0–17.0)
Hemoglobin: 8.1 g/dL — ABNORMAL LOW (ref 13.0–17.0)

## 2017-03-30 LAB — HIV ANTIBODY (ROUTINE TESTING W REFLEX): HIV SCREEN 4TH GENERATION: NONREACTIVE

## 2017-03-30 SURGERY — COLONOSCOPY WITH PROPOFOL
Anesthesia: Monitor Anesthesia Care

## 2017-03-30 MED ORDER — LACTATED RINGERS IV SOLN
INTRAVENOUS | Status: DC | PRN
  Administered 2017-03-30: 12:00:00 via INTRAVENOUS

## 2017-03-30 MED ORDER — PROPOFOL 10 MG/ML IV BOLUS
INTRAVENOUS | Status: AC
  Filled 2017-03-30: qty 40

## 2017-03-30 MED ORDER — PROPOFOL 500 MG/50ML IV EMUL
INTRAVENOUS | Status: DC | PRN
  Administered 2017-03-30: 140 ug/kg/min via INTRAVENOUS

## 2017-03-30 MED ORDER — SODIUM CHLORIDE 0.9 % IV SOLN
INTRAVENOUS | Status: DC
  Administered 2017-03-30 (×2): via INTRAVENOUS

## 2017-03-30 MED ORDER — PROPOFOL 10 MG/ML IV BOLUS
INTRAVENOUS | Status: AC
  Filled 2017-03-30: qty 20

## 2017-03-30 MED ORDER — LIDOCAINE 2% (20 MG/ML) 5 ML SYRINGE
INTRAMUSCULAR | Status: DC | PRN
  Administered 2017-03-30: 100 mg via INTRAVENOUS

## 2017-03-30 MED ORDER — PROPOFOL 10 MG/ML IV BOLUS
INTRAVENOUS | Status: DC | PRN
  Administered 2017-03-30 (×3): 20 mg via INTRAVENOUS

## 2017-03-30 MED ORDER — LIDOCAINE HCL 2 % EX GEL
CUTANEOUS | Status: AC
  Filled 2017-03-30: qty 10

## 2017-03-30 SURGICAL SUPPLY — 22 items

## 2017-03-30 NOTE — Transfer of Care (Signed)
Immediate Anesthesia Transfer of Care Note  Patient: Philip [name]  Procedure(s) Performed: COLONOSCOPY WITH PROPOFOL (N/A )  Patient Location: Endoscopy Unit  Anesthesia Type:MAC  Level of Consciousness: awake  Airway & Oxygen Therapy: Patient Spontanous Breathing and Patient connected to face mask oxygen  Post-op Assessment: Report given to RN and Post -op Vital signs reviewed and stable  Post vital signs: Reviewed and stable  Last Vitals:  Vitals:   03/30/17 0438 03/30/17 1045  BP: 107/65 (!) 118/59  Pulse: 98 (!) 101  Resp: 20 14  Temp: 36.8 C 36.7 C  SpO2:  100%    Last Pain:  Vitals:   03/30/17 1045  TempSrc: Oral  PainSc:          Complications: No apparent anesthesia complications

## 2017-03-30 NOTE — Anesthesia Preprocedure Evaluation (Signed)
Anesthesia Evaluation  Patient identified by MRN, date of birth, ID band Patient awake    Reviewed: Allergy & Precautions, H&P , Patient's Chart, lab work & pertinent test results, reviewed documented beta blocker date and time   History of Anesthesia Complications Negative for: history of anesthetic complications  Airway Mallampati: I  TM Distance: >3 FB Neck ROM: full    Dental  (+) Teeth Intact   Pulmonary sleep apnea , former smoker,    breath sounds clear to auscultation       Cardiovascular hypertension, + Peripheral Vascular Disease  + dysrhythmias  Rhythm:regular Rate:Normal     Neuro/Psych    GI/Hepatic Neg liver ROS, PUD, GERD  ,  Endo/Other  diabetesMorbid obesity  Renal/GU      Musculoskeletal   Abdominal   Peds  Hematology  (+) anemia ,   Anesthesia Other Findings   Reproductive/Obstetrics                             Anesthesia Physical Anesthesia Plan  ASA: II  Anesthesia Plan: MAC   Post-op Pain Management:    Induction:   PONV Risk Score and Plan: 1 and Treatment may vary due to age or medical condition  Airway Management Planned: Nasal Cannula  Additional Equipment: None  Intra-op Plan:   Post-operative Plan:   Informed Consent: I have reviewed the patients History and Physical, chart, labs and discussed the procedure including the risks, benefits and alternatives for the proposed anesthesia with the patient or authorized representative who has indicated his/her understanding and acceptance.   Dental advisory given  Plan Discussed with: CRNA and Surgeon  Anesthesia Plan Comments:         Anesthesia Quick Evaluation

## 2017-03-30 NOTE — Anesthesia Postprocedure Evaluation (Signed)
Anesthesia Post Note  Patient: Philip [name]  Procedure(s) Performed: COLONOSCOPY WITH PROPOFOL (N/A ) GIVENS CAPSULE STUDY (N/A )     Patient location during evaluation: PACU Anesthesia Type: MAC Level of consciousness: awake and alert Pain management: pain level controlled Vital Signs Assessment: post-procedure vital signs reviewed and stable Respiratory status: spontaneous breathing, nonlabored ventilation, respiratory function stable and patient connected to nasal cannula oxygen Cardiovascular status: stable and blood pressure returned to baseline Postop Assessment: no apparent nausea or vomiting Anesthetic complications: no    Last Vitals:  Vitals:   03/30/17 1314 03/30/17 2007  BP: 117/86 113/65  Pulse: 85 80  Resp: 16 17  Temp: 36.7 C 36.8 C  SpO2: 100% 98%    Last Pain:  Vitals:   03/30/17 2007  TempSrc: Oral  PainSc:                  Shonita Rinck

## 2017-03-30 NOTE — Op Note (Signed)
[facility] Patient Name: Philip [name] Procedure Date: 03/30/2017 MRN: [id number] Attending MD: Carlota Raspberry. Armbruster MD, MD Date of Birth: 1961/02/16 CSN: [id number] Age: 56 Admit Type: Inpatient Procedure:                Colonoscopy Indications:              Gastrointestinal bleeding of unclear etiology Providers:                Remo Lipps P. Armbruster MD, MD, Kingsley Plan, RN,                            Tinnie Gens, Technician, Danley Danker, CRNA Referring MD:              Medicines:                Monitored Anesthesia Care Complications:            No immediate complications. Estimated blood loss:                            None. Estimated Blood Loss:     Estimated blood loss: none. Procedure:                Pre-Anesthesia Assessment:                           - Prior to the procedure, a History and Physical                            was performed, and patient medications and                            allergies were reviewed. The patient's tolerance of                            previous anesthesia was also reviewed. The risks                            and benefits of the procedure and the sedation                            options and risks were discussed with the patient.                            All questions were answered, and informed consent                            was obtained. Prior Anticoagulants: The patient has                            taken no previous anticoagulant or antiplatelet                            agents. ASA Grade Assessment: III - A patient with  severe systemic disease. After reviewing the risks                            and benefits, the patient was deemed in                            satisfactory condition to undergo the procedure.                           After obtaining informed consent, the colonoscope                            was passed under direct vision. Throughout the         procedure, the patient's blood pressure, pulse, and                            oxygen saturations were monitored continuously. The                            Colonoscope was introduced through the anus and                            advanced to the the terminal ileum, with                            identification of the appendiceal orifice and IC                            valve. The colonoscopy was performed without                            difficulty. The patient tolerated the procedure                            well. The quality of the bowel preparation was                            unsatisfactory. The terminal ileum, ileocecal                            valve, appendiceal orifice, and rectum were                            photographed. Scope In: 11:46:50 AM Scope Out: 98:33:82 PM Scope Withdrawal Time: 0 hours 14 minutes 51 seconds  Total Procedure Duration: 0 hours 22 minutes 24 seconds  Findings:      The perianal and digital rectal examinations were normal.      The terminal ileum appeared normal.      A large amount of semi-liquid stool was found in the entire colon,       making visualization difficult. The right colon was worst, transverse       moderate, left colon visualization was mostly okay. Lavage of the colon       was performed using copious amounts of sterile water, resulting  in       incomplete clearance with fair visualization of the right and transverse       colon.      Internal hemorrhoids were found during retroflexion.      The exam was otherwise without abnormality - small polyps or flat       lesions may not have been appreciated given bowel prep. Stool was dark       green in color, no blood appreciated. Impression:               - Preparation of the colon was unsatisfactory as                            outlined above                           - Green stool throughout the colon, no blood                           - The examined portion of the  ileum was normal.                           - Internal hemorrhoids.                           - The examination was otherwise normal.                           While the preparation was unsatisfactory, time                            spent lavaging the colon was done, no blood in the                            colon or pathology noted to account for the                            patient's bleeding. Flat lesions (such as AVMs) or                            small polyps may not have been appreciated. Moderate Sedation:      No moderate sedation, case performed with MAC Recommendation:           - Return patient to hospital ward for ongoing care.                           - NPO for now until capsule is done.                           - Continue present medications.                           - Capsule endoscopy to evaluate the small bowel                            given the patient's recent  significant bleeding                            without clear etiology on EGD / colonoscopy                           - Can resume liquids post-capsule protocol                           - Monitor for recurrent bleeding, trend Hgb                           - GI service will continue to follow Procedure Code(s):        --- Professional ---                           [id number], Colonoscopy, flexible; diagnostic, including                            collection of specimen(s) by brushing or washing,                            when performed (separate procedure) Diagnosis Code(s):        --- Professional ---                           K64.8, Other hemorrhoids                           K92.2, Gastrointestinal hemorrhage, unspecified CPT copyright 2016 American Medical Association. All rights reserved. The codes documented in this report are preliminary and upon coder review may  be revised to meet current compliance requirements. Remo Lipps P. Armbruster MD, MD 03/30/2017 94:76:54 PM This report has been signed  electronically. Number of Addenda: 0

## 2017-03-30 NOTE — Progress Notes (Signed)
Patient ingested givens capsule for small bowel endoscopy 03/30/2017 at 1255. Patient is to remain NPO for 2 hours following ingestion. Written instructions reviewed with and given to patient and primary RN. Patient is to wear equipment for 12 full hours, may removed 03/31/17 at 0055. Endoscopy staff will collect equipment 03/31/17 AM.

## 2017-03-30 NOTE — Interval H&P Note (Signed)
History and Physical Interval Note:  03/30/2017 10:41 AM  Philip [name]  has presented today for surgery, with the diagnosis of GI bleed  The various methods of treatment have been discussed with the patient and family. After consideration of risks, benefits and other options for treatment, the patient has consented to  Procedure(s): COLONOSCOPY WITH PROPOFOL (N/A) as a surgical intervention .  The patient's history has been reviewed, patient examined, no change in status, stable for surgery.  I have reviewed the patient's chart and labs.  Questions were answered to the patient's satisfaction.     Viviann SpareSteven P Armbruster

## 2017-03-30 NOTE — Progress Notes (Signed)
PROGRESS NOTE  Trenton Gammonlan M Faucett  QMV:[id number] DOB: Jan 31, 1961 DOA: 03/28/2017 PCP: Angelica ChessmanAguiar, Rafaela M, MD  Brief Narrative:  Brief Narrative  Cherlynn Junelan M Campis a 56 y.o.malewith medical history significant ofbioprosthetic aortic valve replacement in 2005, gastric bypass in 2005, diabetes type 2, essential hypertension, peripheral neuropathy, GERD came to the hospital with complaints of rectal bleeding, he has had issues with upper GI bleeding 12-13 years ago.  Since then he has been stable on Nexium however he accidentally missed taking his Nexium for the last week after which he started developing some epigastric discomfort and bleeding per rectum.  No other EGD on 12/11 or colonoscopy on 12/12 demonstrated any evidence of AVM, bleeding ulcers or other source of his melena.  Capsule endoscopy has been started on 12/12.  Assessment & Plan:  1.  Acute upper GI bleed related blood loss anemia.   -Continue IV PPI - H&H has remained around 8.1 g/dL -Capsule endoscopy has been started -Repeat CBC in a.m.  2.  History of bioprosthetic aortic valve replacement.  No acute issues.  No anticoagulation needed.  3.  Street of gastric bypass surgery in the past.  Supportive care.  4.  Hypertension and dyslipidemia.  Oral medications including statin, ACE inhibitor, diuretics on hold.  Blood pressure remains low normal, continue to hold blood pressure medications   DVT prophylaxis: SCDs Code Status: Full code Family Communication: Patient and his son who was at bedside Disposition Plan: Likely home in 1-2 days pending completion of capsule endoscopy, hemoglobin stable   Consultants:   [facility], Dr. Adela LankArmbruster  Procedures:  12/11: EGD 12/12: Colonoscopy 12/12: Capsule endoscopy  Antimicrobials:  Anti-infectives (From admission, onward)   None       Subjective:  Patient states that he completed his cleanout overnight.  He has been pooping mostly yellowish clear fluid  clear.  He had several bowel movements that were only broth like this morning but the last when he had 2 blood clots mixed in.  There is no tinged red fluid with the blood clots.  He denies abdominal pains, nausea.  Objective: Vitals:   03/30/17 1230 03/30/17 1245 03/30/17 1303 03/30/17 1314  BP: 114/82 117/85  117/86  Pulse: 78   85  Resp: 16 15  16   Temp:    98 F (36.7 C)  TempSrc:    Oral  SpO2: 98% 100%  100%  Weight:   (!) 164.2 kg (362 lb)   Height:   6\' 5"  (1.956 m)     Intake/Output Summary (Last 24 hours) at 03/30/2017 1424 Last data filed at 03/30/2017 1215 Gross per 24 hour  Intake 1515 ml  Output -  Net 1515 ml   Filed Weights   03/28/17 2130 03/30/17 0438 03/30/17 1303  Weight: (!) 163 kg (359 lb 4.8 oz) (!) 164.4 kg (362 lb 7 oz) (!) 164.2 kg (362 lb)    Examination:  General exam:  Adult male.  No acute distress.  HEENT:  NCAT, MMM Respiratory system: Clear to auscultation bilaterally Cardiovascular system: Regular rate and rhythm, normal S1/S2. No murmurs, rubs, gallops or clicks.  Warm extremities Gastrointestinal system: Normal active bowel sounds, soft, nondistended, nontender. MSK:  Normal tone and bulk, no lower extremity edema Neuro:  Grossly intact    Data Reviewed: I have personally reviewed following labs and imaging studies  CBC: Recent Labs  Lab 03/28/17 1655 03/29/17 0521 03/29/17 1300 03/29/17 1935 03/30/17 0052 03/30/17 0658  WBC 9.9 8.4  --   --  10.8*  --   HGB 10.6* 8.5* 8.3* 8.5* 8.2* 8.1*  HCT 33.4* 26.5* 25.8* 26.4* 25.3* 25.3*  MCV 90.5 90.8  --   --  91.3  --   PLT 253 224  --   --  203  --    Basic Metabolic Panel: Recent Labs  Lab 03/28/17 1655 03/29/17 0521 03/30/17 0052  NA 140 139 138  K 4.6 4.0 3.9  CL 106 106 108  CO2 26 28 23   GLUCOSE 168* 142* 124*  BUN 30* 31* 22*  CREATININE 1.03 0.99 1.03  CALCIUM 8.9 8.1* 8.0*   GFR: Estimated Creatinine Clearance: 136.5 mL/min (by C-G formula based on SCr of  1.03 mg/dL). Liver Function Tests: Recent Labs  Lab 03/28/17 1655 03/29/17 0521  AST 25 19  ALT 18 16*  ALKPHOS 71 61  BILITOT 1.2 1.0  PROT 7.0 6.0*  ALBUMIN 3.7 3.4*   No results for input(s): LIPASE, AMYLASE in the last 168 hours. No results for input(s): AMMONIA in the last 168 hours. Coagulation Profile: Recent Labs  Lab 03/28/17 1655 03/29/17 0521  INR 1.21 1.30   Cardiac Enzymes: No results for input(s): CKTOTAL, CKMB, CKMBINDEX, TROPONINI in the last 168 hours. BNP (last 3 results) No results for input(s): PROBNP in the last 8760 hours. HbA1C: No results for input(s): HGBA1C in the last 72 hours. CBG: Recent Labs  Lab 03/29/17 1219 03/29/17 1601 03/29/17 1815 03/29/17 2041 03/30/17 0438  GLUCAP 144* 148* 114* 145* 125*   Lipid Profile: No results for input(s): CHOL, HDL, LDLCALC, TRIG, CHOLHDL, LDLDIRECT in the last 72 hours. Thyroid Function Tests: No results for input(s): TSH, T4TOTAL, FREET4, T3FREE, THYROIDAB in the last 72 hours. Anemia Panel: No results for input(s): VITAMINB12, FOLATE, FERRITIN, TIBC, IRON, RETICCTPCT in the last 72 hours. Urine analysis:    Component Value Date/Time   COLORURINE YELLOW 03/10/2015 0831   APPEARANCEUR CLEAR 03/10/2015 0831   LABSPEC 1.009 03/10/2015 0831   PHURINE 6.5 03/10/2015 0831   GLUCOSEU NEGATIVE 03/10/2015 0831   HGBUR NEGATIVE 03/10/2015 0831   BILIRUBINUR NEGATIVE 03/10/2015 0831   KETONESUR NEGATIVE 03/10/2015 0831   PROTEINUR NEGATIVE 03/10/2015 0831   UROBILINOGEN 1.0 08/08/2007 1244   NITRITE NEGATIVE 03/10/2015 0831   LEUKOCYTESUR NEGATIVE 03/10/2015 0831   Sepsis Labs: @LABRCNTIP (procalcitonin:4,lacticidven:4)  )No results found for this or any previous visit (from the past 240 hour(s)).    Radiology Studies: Ct Angio Abd/pel W/ And/or W/o  Result Date: 03/29/2017 CLINICAL DATA:  56 year old with a GI bleed of unclear etiology. Negative EGD. History of gastric bypass. Melena. EXAM: CT  ANGIOGRAPHY ABDOMEN AND PELVIS WITH CONTRAST AND WITHOUT CONTRAST TECHNIQUE: Multidetector CT imaging of the abdomen and pelvis was performed using the standard protocol during bolus administration of intravenous contrast. Multiplanar reconstructed images and MIPs were obtained and reviewed to evaluate the vascular anatomy. CONTRAST:  ISOVUE-370 IOPAMIDOL (ISOVUE-370) INJECTION 76% COMPARISON:  None. FINDINGS: VASCULAR Aorta: Normal caliber aorta without aneurysm, dissection, vasculitis or significant stenosis. Celiac: Patent without evidence of aneurysm, dissection, vasculitis or significant stenosis. SMA: Patent without evidence of aneurysm, dissection, vasculitis or significant stenosis. Renals: There are 2 renal arteries bilaterally. Bilateral renal arteries are patent without evidence of aneurysm, dissection, vasculitis, fibromuscular dysplasia or significant stenosis. IMA: Patent without evidence of aneurysm, dissection, vasculitis or significant stenosis. Inflow: Patent without evidence of aneurysm, dissection, vasculitis or significant stenosis. Proximal Outflow: Proximal femoral arteries are patent bilaterally. Veins: Portal venous system is patent. IVC and renal veins are  patent. No gross abnormality to the iliac veins. Incidentally, there is a left retroaortic renal vein. Review of the MIP images confirms the above findings. NON-VASCULAR Lower chest: Extensive calcifications throughout the pericardium. Peripheral septal thickening at the lung bases. Probable air trapping along the medial right lower lobe. No large pleural effusions. Prior median sternotomy. Hepatobiliary: Normal appearance of the liver. Multiple calcified gallstones. No evidence for gallbladder inflammation or distension. Pancreas: Normal appearance of the pancreas without inflammation or duct dilatation. Spleen: Normal appearance of spleen without enlargement. Adrenals/Urinary Tract: Adrenal glands are normal. Urinary bladder is  normal without stones. Negative for hydronephrosis. Small amount of perinephric edema along the left kidney upper pole. No suspicious renal lesions. Stomach/Bowel: Surgical changes compatible with a gastric bypass with a Roux-en-Y gastrojejunostomy. Minimal stranding around the gastrojejunostomy. There is no significant bowel distension. No evidence for intravenous contrast within the bowel lumen. No large aneurysms associated with the bowel structures. Normal appearance of the large bowel and appendix. Normal appearance of the terminal ileum. Lymphatic: Small lymph nodes in the iliac chains bilaterally. Overall, no significant lymph node enlargement. Reproductive: Prostate is unremarkable. Other: No ascites.  Negative free air. Musculoskeletal: Extensive facet arthropathy in lower lumbar spine. IMPRESSION: VASCULAR GI bleeding source is NOT identified on this examination. Tagged red blood cell bleeding scan would be another imaging option to look for a bleeding source. No significant atherosclerotic disease or stenosis involving the aorta, iliac arteries are visceral arteries. NON-VASCULAR Postsurgical changes related to a gastric bypass procedure with a Roux-en-Y gastrojejunostomy. Minimal stranding near the gastrojejunostomy is likely postoperative change. No evidence for bowel inflammation or obstruction. Cholelithiasis. No evidence for gallbladder inflammation or biliary dilatation. Electronically Signed   By: Richarda OverlieAdam  Henn M.D.   On: 03/29/2017 14:18     Scheduled Meds: . insulin aspart  0-15 Units Subcutaneous Q6H  . pantoprazole (PROTONIX) IV  40 mg Intravenous Daily   Continuous Infusions: . sodium chloride 75 mL/hr at 03/30/17 0148  . sodium chloride       LOS: 2 days    Time spent: 30 min    Renae FickleMackenzie Kirstyn Lean, MD [facility] Pager [phone]  If 7PM-7AM, please contact night-coverage [email or web address] Password [email or web address] 03/30/2017, 2:24 PM

## 2017-03-31 ENCOUNTER — Encounter (HOSPITAL_COMMUNITY): Payer: Self-pay | Admitting: Gastroenterology

## 2017-03-31 DIAGNOSIS — I5033 Acute on chronic diastolic (congestive) heart failure: Secondary | ICD-10-CM

## 2017-03-31 DIAGNOSIS — I5032 Chronic diastolic (congestive) heart failure: Secondary | ICD-10-CM

## 2017-03-31 LAB — BASIC METABOLIC PANEL
ANION GAP: 5 (ref 5–15)
BUN: 22 mg/dL — ABNORMAL HIGH (ref 6–20)
CALCIUM: 7.9 mg/dL — AB (ref 8.9–10.3)
CO2: 25 mmol/L (ref 22–32)
Chloride: 107 mmol/L (ref 101–111)
Creatinine, Ser: 0.98 mg/dL (ref 0.61–1.24)
GFR calc Af Amer: >60 mL/min (ref >60)
GLUCOSE: 186 mg/dL — AB (ref 65–99)
Potassium: 3.8 mmol/L (ref 3.5–5.1)
Sodium: 137 mmol/L (ref 135–145)

## 2017-03-31 LAB — HEMOGLOBIN AND HEMATOCRIT, BLOOD
HCT: 27.4 % — ABNORMAL LOW (ref 39.0–52.0)
HEMOGLOBIN: 9 g/dL — AB (ref 13.0–17.0)

## 2017-03-31 LAB — GLUCOSE, CAPILLARY
GLUCOSE-CAPILLARY: 174 mg/dL — AB (ref 65–99)
GLUCOSE-CAPILLARY: 213 mg/dL — AB (ref 65–99)
GLUCOSE-CAPILLARY: 167 mg/dL — AB (ref 65–99)

## 2017-03-31 LAB — CBC
HCT: 21.1 % — ABNORMAL LOW (ref 39.0–52.0)
HEMOGLOBIN: 6.8 g/dL — AB (ref 13.0–17.0)
MCH: 29.2 pg (ref 26.0–34.0)
MCHC: 32.2 g/dL (ref 30.0–36.0)
MCV: 90.6 fL (ref 78.0–100.0)
Platelets: 173 10*3/uL (ref 150–400)
RBC: 2.33 MIL/uL — ABNORMAL LOW (ref 4.22–5.81)
RDW: 14.2 % (ref 11.5–15.5)
WBC: 7.4 10*3/uL (ref 4.0–10.5)

## 2017-03-31 LAB — PREPARE RBC (CROSSMATCH)

## 2017-03-31 MED ORDER — POTASSIUM CHLORIDE CRYS ER 20 MEQ PO TBCR
40.0000 meq | EXTENDED_RELEASE_TABLET | Freq: Once | ORAL | Status: AC
  Administered 2017-03-31: 40 meq via ORAL
  Filled 2017-03-31: qty 2

## 2017-03-31 MED ORDER — INSULIN ASPART 100 UNIT/ML ~~LOC~~ SOLN: 0.0000 [IU] | Freq: Three times a day (TID) | SUBCUTANEOUS | 11 refills | Status: DC

## 2017-03-31 MED ORDER — INSULIN ASPART 100 UNIT/ML ~~LOC~~ SOLN: 0.0000 [IU] | Freq: Every day | SUBCUTANEOUS | Status: DC

## 2017-03-31 MED ORDER — SODIUM CHLORIDE 0.9 % IV SOLN
Freq: Once | INTRAVENOUS | Status: DC
  Administered 2017-03-31: 07:00:00 via INTRAVENOUS

## 2017-03-31 MED ORDER — INSULIN ASPART 100 UNIT/ML ~~LOC~~ SOLN
0.0000 [IU] | Freq: Three times a day (TID) | SUBCUTANEOUS | Status: DC
  Administered 2017-03-31: 4 [IU] via SUBCUTANEOUS

## 2017-03-31 MED ORDER — GUAIFENESIN-DM 100-10 MG/5ML PO SYRP: 5.0000 mL | ORAL_SOLUTION | ORAL | Status: DC | PRN

## 2017-03-31 MED ORDER — LORATADINE 10 MG PO TABS
10.0000 mg | ORAL_TABLET | Freq: Every day | ORAL | Status: DC
  Administered 2017-03-31: 10 mg via ORAL
  Filled 2017-03-31: qty 1

## 2017-03-31 MED ORDER — FUROSEMIDE 10 MG/ML IJ SOLN: 40.0000 mg | Freq: Two times a day (BID) | INTRAMUSCULAR | 0 refills | Status: DC

## 2017-03-31 MED ORDER — INSULIN ASPART 100 UNIT/ML ~~LOC~~ SOLN: 0.0000 [IU] | Freq: Every day | SUBCUTANEOUS | 11 refills | Status: DC

## 2017-03-31 MED ORDER — GUAIFENESIN-DM 100-10 MG/5ML PO SYRP
10.0000 mL | ORAL_SOLUTION | ORAL | Status: DC | PRN
  Administered 2017-03-31 (×2): 10 mL via ORAL
  Filled 2017-03-31 (×2): qty 10

## 2017-03-31 MED ORDER — GUAIFENESIN-DM 100-10 MG/5ML PO SYRP: 10.0000 mL | ORAL_SOLUTION | ORAL | 0 refills | Status: DC | PRN

## 2017-03-31 MED ORDER — FUROSEMIDE 10 MG/ML IJ SOLN
40.0000 mg | Freq: Two times a day (BID) | INTRAMUSCULAR | Status: DC
  Administered 2017-03-31 (×2): 40 mg via INTRAVENOUS
  Filled 2017-03-31 (×2): qty 4

## 2017-03-31 MED ORDER — SODIUM CHLORIDE 0.9 % IV SOLN
Freq: Once | INTRAVENOUS | Status: AC
  Administered 2017-03-31: 12:00:00 via INTRAVENOUS

## 2017-03-31 MED ORDER — METOLAZONE 5 MG PO TABS
5.0000 mg | ORAL_TABLET | Freq: Once | ORAL | Status: AC
  Administered 2017-03-31: 5 mg via ORAL
  Filled 2017-03-31: qty 1

## 2017-03-31 NOTE — Progress Notes (Signed)
Report called [facility] Victorino DikeJennifer, [facility] Transport was arranged with [facility]. [facility] transport stated they would not provide transport for patient. Will monitor until transport arrives.

## 2017-03-31 NOTE — Progress Notes (Signed)
CRITICAL VALUE ALERT  Critical Value:  Hemoglobin 6.8  Date & Time Notified:  03/31/2017 [id number]  Provider Notified: Merdis DelayK. Schorr, NP  Orders Received/Actions taken:

## 2017-03-31 NOTE — Discharge Instructions (Signed)
Gastrointestinal Bleeding °Gastrointestinal (GI) bleeding is bleeding somewhere along the digestive tract, between the mouth and anus. This can be caused by various problems. The severity of these problems can range from mild to serious or even life-threatening. If you have GI bleeding, you may find blood in your stools (feces), you may have black stools, or you may vomit blood. If there is a lot of bleeding, you may need to stay in the hospital. °What are the causes? °This condition may be caused by: °· Esophagitis. This is inflammation, irritation, or swelling of the esophagus. °· Hemorrhoids. These are swollen veins in the rectum. °· Anal fissures. These are areas of painful tearing that are often caused by passing hard stool. °· Diverticulosis. These are pouches that form on the colon over time, with age, and may bleed a lot. °· Diverticulitis. This is inflammation in areas with diverticulosis. It can cause pain, fever, and bloody stools, although bleeding may be mild. °· Polyps and cancer. Colon cancer often starts out as precancerous polyps. °· Gastritis and ulcers. With these, bleeding may come from the upper GI tract, near the stomach. ° °What are the signs or symptoms? °Symptoms of this condition may include: °· Bright red blood in your vomit, or vomit that looks like coffee grounds. °· Bloody, black, or tarry stools. °? Bleeding from the lower GI tract will usually cause red or maroon blood in the stools. °? Bleeding from the upper GI tract may cause black, tarry, often bad-smelling stools. °? In certain cases, if the bleeding is fast enough, the stools may be red. °· Pain or cramping in the abdomen. ° °How is this diagnosed? °This condition may be diagnosed based on: °· Medical history and physical exam. °· Various tests, such as: °? Blood tests. °? X-rays and other imaging tests. °? Esophagogastroduodenoscopy (EGD). In this test, a flexible, lighted tube is used to look at your esophagus, stomach, and  small intestine. °? Colonoscopy. In this test, a flexible, lighted tube is used to look at your colon. ° °How is this treated? °Treatment for this condition depends on the cause of the bleeding. For example: °· For bleeding from the esophagus, stomach, small intestine, or colon, the health care provider doing your EGD or colonoscopy may be able to stop the bleeding as part of the procedure. °· Inflammation or infection of the colon can be treated with medicines. °· Certain rectal problems can be treated with creams, suppositories, or warm baths. °· Surgery is sometimes needed. °· Blood transfusions are sometimes needed if a lot of blood has been lost. ° °If bleeding is slow, you may be allowed to go home. If there is a lot of bleeding, you will need to stay in the hospital for observation. °Follow these instructions at home: °· Take over-the-counter and prescription medicines only as told by your health care provider. °· Eat foods that are high in fiber. This will help to keep your stools soft. These foods include whole grains, legumes, fruits, and vegetables. Eating 1-3 prunes each day works well for many people. °· Drink enough fluid to keep your urine clear or pale yellow. °· Keep all follow-up visits as told by your health care provider. This is important. °Contact a health care provider if: °· Your symptoms do not improve. °Get help right away if: °· Your bleeding increases. °· You feel light-headed or you faint. °· You feel weak. °· You have severe cramps in your back or abdomen. °· You pass large blood clots in your stool. °·   Your symptoms are getting worse. °This information is not intended to replace advice given to you by your health care provider. Make sure you discuss any questions you have with your health care provider. °Document Released: 04/02/2000 Document Revised: 09/03/2015 Document Reviewed: 09/23/2014 °Elsevier Interactive Patient Education © 2018 [employer] ° °

## 2017-03-31 NOTE — Progress Notes (Signed)
Patient ID: Philip [name], male   DOB: 04-Aug-1960, 56 y.o.   MRN: [id number]      Progress Note   Subjective   Feels ok - has cough , denies SOB - hospitalist starting Lasix Has passed dark blackish liquid 3-4 x post Colonoscopy - last was 6 hours ago  Colonoscopy yesterday  Negative with no blood  Noted in colon Capsule endoscopy exam has completed - will interpret today   HGB down to 6.8 this am !- to be transfused one unit   Objective   Vital signs in last 24 hours: Temp:  [97.5 F (36.4 C)-98.2 F (36.8 C)] 97.5 F (36.4 C) (12/13 0903) Pulse Rate:  [78-101] 99 (12/13 0903) Resp:  [14-18] 18 (12/13 0903) BP: (109-126)/(59-86) 115/71 (12/13 0903) SpO2:  [98 %-100 %] 100 % (12/13 0903) Weight:  [362 lb (164.2 kg)-372 lb 9.2 oz (169 kg)] 372 lb 9.2 oz (169 kg) (12/13 0536) Last BM Date: 03/30/17 General:    Obese WM  in NAD Heart:  Regular rate and rhythm; no murmurs Lungs: Respirations even and unlabored,  Abdomen:  Soft, nontender obese  Normal bowel sounds. Extremities:  Without edema. Neurologic:  Alert and oriented,  grossly normal neurologically. Psych:  Cooperative. Normal mood and affect.  Intake/Output from previous day: 12/12 0701 - 12/13 0700 In: 2300 [P.O.:300; I.V.:2000] Out: -  Intake/Output this shift: No intake/output data recorded.  Lab Results: Recent Labs    03/29/17 0521  03/30/17 0052 03/30/17 0658 03/30/17 1330 03/31/17 0533  WBC 8.4  --  10.8*  --   --  7.4  HGB 8.5*   < > 8.2* 8.1* 8.0* 6.8*  HCT 26.5*   < > 25.3* 25.3* 24.5* 21.1*  PLT 224  --  203  --   --  173   < > = values in this interval not displayed.   BMET Recent Labs    03/29/17 0521 03/30/17 0052 03/31/17 0533  NA 139 138 137  K 4.0 3.9 3.8  CL 106 108 107  CO2 28 23 25   GLUCOSE 142* 124* 186*  BUN 31* 22* 22*  CREATININE 0.99 1.03 0.98  CALCIUM 8.1* 8.0* 7.9*   LFT Recent Labs    03/29/17 0521  PROT 6.0*  ALBUMIN 3.4*  AST 19  ALT 16*  ALKPHOS 61    BILITOT 1.0   PT/INR Recent Labs    03/28/17 1655 03/29/17 0521  LABPROT 15.2 16.1*  INR 1.21 1.30    Studies/Results: Ct Angio Abd/pel W/ And/or W/o  Result Date: 03/29/2017 CLINICAL DATA:  56 year old with a GI bleed of unclear etiology. Negative EGD. History of gastric bypass. Melena. EXAM: CT ANGIOGRAPHY ABDOMEN AND PELVIS WITH CONTRAST AND WITHOUT CONTRAST TECHNIQUE: Multidetector CT imaging of the abdomen and pelvis was performed using the standard protocol during bolus administration of intravenous contrast. Multiplanar reconstructed images and MIPs were obtained and reviewed to evaluate the vascular anatomy. CONTRAST:  100mL ISOVUE-370 IOPAMIDOL (ISOVUE-370) INJECTION 76% COMPARISON:  None. FINDINGS: VASCULAR Aorta: Normal caliber aorta without aneurysm, dissection, vasculitis or significant stenosis. Celiac: Patent without evidence of aneurysm, dissection, vasculitis or significant stenosis. SMA: Patent without evidence of aneurysm, dissection, vasculitis or significant stenosis. Renals: There are 2 renal arteries bilaterally. Bilateral renal arteries are patent without evidence of aneurysm, dissection, vasculitis, fibromuscular dysplasia or significant stenosis. IMA: Patent without evidence of aneurysm, dissection, vasculitis or significant stenosis. Inflow: Patent without evidence of aneurysm, dissection, vasculitis or significant stenosis. Proximal Outflow: Proximal femoral arteries  are patent bilaterally. Veins: Portal venous system is patent. IVC and renal veins are patent. No gross abnormality to the iliac veins. Incidentally, there is a left retroaortic renal vein. Review of the MIP images confirms the above findings. NON-VASCULAR Lower chest: Extensive calcifications throughout the pericardium. Peripheral septal thickening at the lung bases. Probable air trapping along the medial right lower lobe. No large pleural effusions. Prior median sternotomy. Hepatobiliary: Normal appearance  of the liver. Multiple calcified gallstones. No evidence for gallbladder inflammation or distension. Pancreas: Normal appearance of the pancreas without inflammation or duct dilatation. Spleen: Normal appearance of spleen without enlargement. Adrenals/Urinary Tract: Adrenal glands are normal. Urinary bladder is normal without stones. Negative for hydronephrosis. Small amount of perinephric edema along the left kidney upper pole. No suspicious renal lesions. Stomach/Bowel: Surgical changes compatible with a gastric bypass with a Roux-en-Y gastrojejunostomy. Minimal stranding around the gastrojejunostomy. There is no significant bowel distension. No evidence for intravenous contrast within the bowel lumen. No large aneurysms associated with the bowel structures. Normal appearance of the large bowel and appendix. Normal appearance of the terminal ileum. Lymphatic: Small lymph nodes in the iliac chains bilaterally. Overall, no significant lymph node enlargement. Reproductive: Prostate is unremarkable. Other: No ascites.  Negative free air. Musculoskeletal: Extensive facet arthropathy in lower lumbar spine. IMPRESSION: VASCULAR GI bleeding source is NOT identified on this examination. Tagged red blood cell bleeding scan would be another imaging option to look for a bleeding source. No significant atherosclerotic disease or stenosis involving the aorta, iliac arteries are visceral arteries. NON-VASCULAR Postsurgical changes related to a gastric bypass procedure with a Roux-en-Y gastrojejunostomy. Minimal stranding near the gastrojejunostomy is likely postoperative change. No evidence for bowel inflammation or obstruction. Cholelithiasis. No evidence for gallbladder inflammation or biliary dilatation. Electronically Signed   By: Adam  Henn MRicharda Overlie.D.   On: 03/29/2017 14:18       Assessment / Plan:    #1 56 yo male with Acute GI bleed  Of unclear etiology Negative EGD and Colonoscopy this admit  Capsule endoscopy   Completed this am - will read today   #2 anemia secondary to acute blood loss - transfusing x 2 today  #3 s/p Roux -en-Y  Gastric bypass  #4 morbid obesity #5 s/p aortic valve replacement 2005  #6 AODM  #7 h x  Of marginal ulcer with GI bleed #8 HTN  Plan;  Will order a second unit of blood today , serial hgb 's  Review capsule study today - further plans pending results of Capsule    Contact  Venise Ellingwood, P.A.-C               [phone]) [phone]      Principal Problem:   GI bleed Active Problems:   S/P AVR (aortic valve replacement)   S/P gastric bypass   Morbid obesity (HCC)   Acute blood loss anemia     LOS: 3 days   Matilynn Dacey  03/31/2017, 10:26 AM

## 2017-03-31 NOTE — Progress Notes (Signed)
Patient informed RN that the PA from Gastroenterology came to speak with him 15 minutes before 1900 to let him know that he was going to be discharged from our facility and that [facility] would be picking him up within the hour [facility]. RN was never consulted or told of these plans until the patient told her himself. RN has gone over discharge instructions with patient, awaiting for transportation to come pick patient up.  Rockne CoonsCorcoran, Dane Kopke C, RN 03/31/17 7:59 PM

## 2017-03-31 NOTE — Progress Notes (Signed)
Patient ID: Philip [name], male   DOB: 1960/10/13, 56 y.o.   MRN: [id number]   Capsule endoscopy ;   Copy of report given to patient .  [facility] and no wait list . [facility] with Dr Theodore DemarkWild.  Initial review of capsule red as first Jejunal image  At 8 minutes, further review feel first jejunal image is at 45 minutes - fist evidence of bleeding starts at 2 hrs 31 minutes .   Appreciate [facility] Bariatric surgery  team's willingness to accept pt . He is to be transferred tonight  I spoke with pt and his wife.

## 2017-03-31 NOTE — Progress Notes (Signed)
PROGRESS NOTE  Philip [name]  JYN:[id number] DOB: 02/17/1961 DOA: 03/28/2017 PCP: Angelica ChessmanAguiar, Rafaela M, MD  Brief Narrative:  Brief Narrative  Philip [name] a 56 y.o.malewith medical history significant ofbioprosthetic aortic valve replacement in 2005, gastric bypass in 2005, diabetes type 2, essential hypertension, peripheral neuropathy, GERD came to the hospital with complaints of rectal bleeding, he has had issues with upper GI bleeding 12-13 years ago.  Since then he has been stable on Nexium however he accidentally missed taking his Nexium for the last week after which he started developing some epigastric discomfort and bleeding per rectum.  No other EGD on 12/11 or colonoscopy on 12/12 demonstrated any evidence of AVM, bleeding ulcers or other source of his melena.  Capsule endoscopy started on 12/12.  Ongoing melena and hemoglobin decreased to 6.8 g/dL this morning.  Assessment & Plan:  1.  Acute upper GI bleed related acute blood loss anemia.   -Continue IV PPI -Transfuse 2 units PRBCs -Capsule endoscopy to be read by gastroenterology today -Appreciate gastroenterology assistance -Repeat CBC in a.m.  2.  Bioprosthetic aortic valve replacement.  No acute issues.  No anticoagulation needed.  3.  History of gastric bypass surgery.  Supportive care.  4.  Hypertension and dyslipidemia.  Oral medications including statin, ACE inhibitor on hold.   Acute on chronic diastolic heart failure, increased cough and orthopnea today.  Increase LEE -  Resume metolazone and start lasix 40mg  IV BID -  Daily weights and strict I/O -  D/c IVF   DVT prophylaxis: SCDs Code Status: Full code Family Communication: Patient alone, no family at bedside today Disposition Plan: Likely home in 2 days pending hemoglobin stable.  Awaiting results of capsule endoscopy.  Transfusing blood today.   Consultants:   [facility], Dr. Adela LankArmbruster  Procedures:  12/11: EGD 12/12:  Colonoscopy 12/12: Capsule endoscopy  Antimicrobials:  Anti-infectives (From admission, onward)   None       Subjective:  Patient states that he has been having some dark sticky stools since his colonoscopy yesterday.  His last stools have been more like blood clots mixed with liquid.  He denies lightheadedness, dizziness, shortness of breath.  He has been coughing more than before and is having some orthopnea.  Swelling in his ankles might be a little worse. Objective: Vitals:   03/31/17 0903 03/31/17 1120 03/31/17 1156 03/31/17 1212  BP: 115/71 114/83 118/88 114/76  Pulse: 99 70 (!) 109 (!) 115  Resp: 18 18 18 18   Temp: (!) 97.5 F (36.4 C) 97.6 F (36.4 C) (!) 97.5 F (36.4 C) 97.7 F (36.5 C)  TempSrc: Axillary Axillary Axillary Axillary  SpO2: 100% 100% 96% 99%  Weight:      Height:        Intake/Output Summary (Last 24 hours) at 03/31/2017 1448 Last data filed at 03/31/2017 1118 Gross per 24 hour  Intake 2415 ml  Output -  Net 2415 ml   Filed Weights   03/30/17 0438 03/30/17 1303 03/31/17 0536  Weight: (!) 164.4 kg (362 lb 7 oz) (!) 164.2 kg (362 lb) (!) 169 kg (372 lb 9.2 oz)    Examination:  General exam:  Adult male.  No acute distress.  HEENT:  NCAT, MMM Respiratory system: Clear to auscultation bilaterally but has a wheezy cough and coughs frequently during exam Cardiovascular system: Regular rate and rhythm, normal S1/S2. No murmurs, rubs, gallops or clicks.  Warm extremities Gastrointestinal system: Normal active bowel sounds, soft, nondistended, nontender. MSK:  Normal tone and bulk, 1+ pitting bilateral lower extremity edema Neuro:  Grossly intact  Data Reviewed: I have personally reviewed following labs and imaging studies  CBC: Recent Labs  Lab 03/28/17 1655 03/29/17 0521  03/29/17 1935 03/30/17 0052 03/30/17 0658 03/30/17 1330 03/31/17 0533  WBC 9.9 8.4  --   --  10.8*  --   --  7.4  HGB 10.6* 8.5*   < > 8.5* 8.2* 8.1* 8.0* 6.8*  HCT  33.4* 26.5*   < > 26.4* 25.3* 25.3* 24.5* 21.1*  MCV 90.5 90.8  --   --  91.3  --   --  90.6  PLT 253 224  --   --  203  --   --  173   < > = values in this interval not displayed.   Basic Metabolic Panel: Recent Labs  Lab 03/28/17 1655 03/29/17 0521 03/30/17 0052 03/31/17 0533  NA 140 139 138 137  K 4.6 4.0 3.9 3.8  CL 106 106 108 107  CO2 26 28 23 25   GLUCOSE 168* 142* 124* 186*  BUN 30* 31* 22* 22*  CREATININE 1.03 0.99 1.03 0.98  CALCIUM 8.9 8.1* 8.0* 7.9*   GFR: Estimated Creatinine Clearance: 145.9 mL/min (by C-G formula based on SCr of 0.98 mg/dL). Liver Function Tests: Recent Labs  Lab 03/28/17 1655 03/29/17 0521  AST 25 19  ALT 18 16*  ALKPHOS 71 61  BILITOT 1.2 1.0  PROT 7.0 6.0*  ALBUMIN 3.7 3.4*   No results for input(s): LIPASE, AMYLASE in the last 168 hours. No results for input(s): AMMONIA in the last 168 hours. Coagulation Profile: Recent Labs  Lab 03/28/17 1655 03/29/17 0521  INR 1.21 1.30   Cardiac Enzymes: No results for input(s): CKTOTAL, CKMB, CKMBINDEX, TROPONINI in the last 168 hours. BNP (last 3 results) No results for input(s): PROBNP in the last 8760 hours. HbA1C: No results for input(s): HGBA1C in the last 72 hours. CBG: Recent Labs  Lab 03/30/17 0438 03/30/17 1655 03/30/17 2146 03/31/17 0535 03/31/17 1045  GLUCAP 125* 143* 242* 174* 213*   Lipid Profile: No results for input(s): CHOL, HDL, LDLCALC, TRIG, CHOLHDL, LDLDIRECT in the last 72 hours. Thyroid Function Tests: No results for input(s): TSH, T4TOTAL, FREET4, T3FREE, THYROIDAB in the last 72 hours. Anemia Panel: No results for input(s): VITAMINB12, FOLATE, FERRITIN, TIBC, IRON, RETICCTPCT in the last 72 hours. Urine analysis:    Component Value Date/Time   COLORURINE YELLOW 03/10/2015 0831   APPEARANCEUR CLEAR 03/10/2015 0831   LABSPEC 1.009 03/10/2015 0831   PHURINE 6.5 03/10/2015 0831   GLUCOSEU NEGATIVE 03/10/2015 0831   HGBUR NEGATIVE 03/10/2015 0831    BILIRUBINUR NEGATIVE 03/10/2015 0831   KETONESUR NEGATIVE 03/10/2015 0831   PROTEINUR NEGATIVE 03/10/2015 0831   UROBILINOGEN 1.0 08/08/2007 1244   NITRITE NEGATIVE 03/10/2015 0831   LEUKOCYTESUR NEGATIVE 03/10/2015 0831   Sepsis Labs: @LABRCNTIP (procalcitonin:4,lacticidven:4)  )No results found for this or any previous visit (from the past 240 hour(s)).    Radiology Studies: No results found.   Scheduled Meds: . furosemide  40 mg Intravenous BID  . insulin aspart  0-20 Units Subcutaneous TID WC  . insulin aspart  0-5 Units Subcutaneous QHS  . loratadine  10 mg Oral Daily  . pantoprazole (PROTONIX) IV  40 mg Intravenous Daily   Continuous Infusions:    LOS: 3 days    Time spent: 30 min    Renae Fickle, MD [facility] Pager [phone]  If 7PM-7AM, please contact night-coverage  [email or web address] Password [email or web address] 03/31/2017, 2:48 PM

## 2017-03-31 NOTE — Progress Notes (Signed)
Inpatient Diabetes Program Recommendations  AACE/ADA: New Consensus Statement on Inpatient Glycemic Control (2015)  Target Ranges:  Prepandial:   less than 140 mg/dL      Peak postprandial:   less than 180 mg/dL (1-2 hours)      Critically ill patients:  140 - 180 mg/dL   Lab Results  Component Value Date   GLUCAP 213 (H) 03/31/2017   HGBA1C 7.8 (H) 03/18/2015    Review of Glycemic Control  Diabetes history: DM2 Outpatient Diabetes medications: Janumet 50/1000 mg bid Current orders for Inpatient glycemic control: Novolog 0-15 units Q6H  No HgbA1C results. Would not order since H/H low and test would not be accurate. CBGs > 180 mg/dL  Inpatient Diabetes Program Recommendations:     Increase Novolog to 0-20 units Q6H.  Continue to follow.  Thank you. Philip [name], RD, LDN, CDE Inpatient Diabetes Coordinator [phone]

## 2017-03-31 NOTE — Discharge Summary (Signed)
Physician Discharge Summary  Philip [name] [id number] DOB: [birth year] DOA: 03/28/2017  PCP: Philip [name], Philip M, MD  Admit date: 03/28/2017 Discharge date: 03/31/2017  [facility]  Discharge Condition:  fair CODE STATUS: Full code Diet recommendation: Diabetic diet  Brief/Interim Summary:  Brief NarrativeAlan M [name] a 56 y.o.malewith medical history significant ofbioprosthetic aortic valve replacement in 2005, gastric bypass in 2005, diabetes mellitus type 2, essential hypertension, peripheral neuropathy, and GERD came to the hospital with complaints of rectal bleeding.  He had upper GI bleeding 12-13 years ago. Since then he was stable on Nexium.  He stopped taking his Nexium for 1-2 weeks prior to admission.   He developed some epigastric discomfort which was shortly followed by 4-5 maroon colored and tarry stools.  He presented to the ER.  EGD on 12/11 did not identify the source of the bleeding.  He underwent CT angio abdomen on 12/11 which also did not identify the source of bleeding.  This was followed on 12/12 by colonoscopy, but again, no obvious source of bleeding.  After colonoscopy, he had two melanotic stools and his hemoglobin dropped to 6.8g/dl.  He underwent capsule endoscopy which identified blood in the mid small bowel which turned to dark blood in the distal small bowel. No obvious abnormalities were otherwise seen, no mass lesions or polyps.  He was transfused 2 units of PRBC on 12/13 and has been [facility] for ongoing management of his small bowel hemorrhage.    Discharge Diagnoses:  Principal Problem:   GI bleed Active Problems:   S/P AVR (aortic valve replacement)   S/P gastric bypass   Morbid obesity (HCC)   Acute blood loss anemia   Acute on chronic diastolic heart failure (HCC)  Acute small bowel GI bleed related acute blood loss anemia.  -Transfused a total of 2 units PRBCs on 12/13  for hemoglobin of 6.8g/dl.  (Initial hemoglobin at admission was 10.6g/dl).  Post transfusion hemoglobin: 9g/dl. - [facility] on the bariatric service while awaiting consideration of deep enter[facility] Theodore [name].    Bioprosthetic aortic valve replacement. No acute issues. No anticoagulation needed.  History of gastric bypass surgery. no problems seen at anastomosis.    Hypertension and dyslipidemia. Oral medications including statin, ACE inhibitor on hold.  Blood pressures have been low normal.    Acute on chronic diastolic heart failure, increased cough and orthopnea on 12/13.  Increased LEE.  -  give metolazone 5mg  today and start lasix 40mg  IV BID -  Resume once weekly metolazone -  Repeat BMP in AM -  Daily weights and strict I/O -  D/c IVF  Discharge Instructions     Medication List    STOP taking these medications   aspirin 81 MG tablet   b complex vitamins tablet   CALCIUM + D PO   furosemide 40 MG tablet Commonly known as:  LASIX Replaced by:  furosemide 10 MG/ML injection   JANUMET 50-1000 MG tablet Generic drug:  sitaGLIPtin-metformin   KLOR-CON 8 MEQ tablet Generic drug:  potassium chloride   Melatonin 3 MG Caps   ramipril 5 MG capsule Commonly known as:  ALTACE   simvastatin 10 MG tablet Commonly known as:  ZOCOR   vitamin C 1000 MG tablet     TAKE these medications   furosemide 10 MG/ML injection Commonly known as:  LASIX Inject 4 mLs (40 mg total) into the  vein 2 (two) times daily. Start taking on:  04/01/2017 Replaces:  furosemide 40 MG tablet   guaiFENesin-dextromethorphan 100-10 MG/5ML syrup Commonly known as:  ROBITUSSIN DM Take 10 mLs by mouth every 4 (four) hours as needed for cough.   insulin aspart 100 UNIT/ML injection Commonly known as:  novoLOG Inject 0-5 Units into the skin at bedtime.   insulin aspart 100 UNIT/ML injection Commonly known as:  novoLOG Inject 0-20 Units into  the skin 3 (three) times daily with meals. Start taking on:  04/01/2017   metolazone 5 MG tablet Commonly known as:  ZAROXOLYN Take 5 mg by mouth once a week. Saturdays   NEXIUM 40 MG capsule Generic drug:  esomeprazole Take 1 tablet by mouth daily. PM   ZYRTEC ALLERGY PO Take 10 mg by mouth daily. Reported on 09/05/2015      Follow-up Information    Philip [name], Philip M, MD Follow up.   Specialty:  Family Medicine Contact information: [address] [place] [id number] [phone]          Allergies  Allergen Reactions  . Nsaids Swelling and Other (See Comments)    NO REACTION LISTED LIPS AND NECK  . Penicillins Hives, Swelling and Other (See Comments)    NO REACTION LISTED Has patient had a PCN reaction causing immediate rash, facial/tongue/throat swelling, SOB or lightheadedness with hypotension: Yes Has patient had a PCN reaction causing severe rash involving mucus membranes or skin necrosis: No Has patient had a PCN reaction that required hospitalization No Has patient had a PCN reaction occurring within the last 10 years: No If all of the above answers are "NO", then may proceed with Cephalosporin use.  Has taken Keflex w/o issue      . Other Other (See Comments)    CYCLOOXYGENASE INHIBITORS-NO REACTION LISTED. CYCLOOXYGENASE INHIBITORS-NO REACTION LISTED.    Consultations: [facility]  Procedures/Studies: Ct Angio Abd/pel W/ And/or W/o  Result Date: 03/29/2017 CLINICAL DATA:  56 year old with a GI bleed of unclear etiology. Negative EGD. History of gastric bypass. Melena. EXAM: CT ANGIOGRAPHY ABDOMEN AND PELVIS WITH CONTRAST AND WITHOUT CONTRAST TECHNIQUE: Multidetector CT imaging of the abdomen and pelvis was performed using the standard protocol during bolus administration of intravenous contrast. Multiplanar reconstructed images and MIPs were obtained and reviewed to evaluate the vascular anatomy. CONTRAST:  100mL ISOVUE-370  IOPAMIDOL (ISOVUE-370) INJECTION 76% COMPARISON:  None. FINDINGS: VASCULAR Aorta: Normal caliber aorta without aneurysm, dissection, vasculitis or significant stenosis. Celiac: Patent without evidence of aneurysm, dissection, vasculitis or significant stenosis. SMA: Patent without evidence of aneurysm, dissection, vasculitis or significant stenosis. Renals: There are 2 renal arteries bilaterally. Bilateral renal arteries are patent without evidence of aneurysm, dissection, vasculitis, fibromuscular dysplasia or significant stenosis. IMA: Patent without evidence of aneurysm, dissection, vasculitis or significant stenosis. Inflow: Patent without evidence of aneurysm, dissection, vasculitis or significant stenosis. Proximal Outflow: Proximal femoral arteries are patent bilaterally. Veins: Portal venous system is patent. IVC and renal veins are patent. No gross abnormality to the iliac veins. Incidentally, there is a left retroaortic renal vein. Review of the MIP images confirms the above findings. NON-VASCULAR Lower chest: Extensive calcifications throughout the pericardium. Peripheral septal thickening at the lung bases. Probable air trapping along the medial right lower lobe. No large pleural effusions. Prior median sternotomy. Hepatobiliary: Normal appearance of the liver. Multiple calcified gallstones. No evidence for gallbladder inflammation or distension. Pancreas: Normal appearance of the pancreas without inflammation or duct dilatation. Spleen: Normal appearance of spleen without  enlargement. Adrenals/Urinary Tract: Adrenal glands are normal. Urinary bladder is normal without stones. Negative for hydronephrosis. Small amount of perinephric edema along the left kidney upper pole. No suspicious renal lesions. Stomach/Bowel: Surgical changes compatible with a gastric bypass with a Roux-en-Y gastrojejunostomy. Minimal stranding around the gastrojejunostomy. There is no significant bowel distension. No evidence for  intravenous contrast within the bowel lumen. No large aneurysms associated with the bowel structures. Normal appearance of the large bowel and appendix. Normal appearance of the terminal ileum. Lymphatic: Small lymph nodes in the iliac chains bilaterally. Overall, no significant lymph node enlargement. Reproductive: Prostate is unremarkable. Other: No ascites.  Negative free air. Musculoskeletal: Extensive facet arthropathy in lower lumbar spine. IMPRESSION: VASCULAR GI bleeding source is NOT identified on this examination. Tagged red blood cell bleeding scan would be another imaging option to look for a bleeding source. No significant atherosclerotic disease or stenosis involving the aorta, iliac arteries are visceral arteries. NON-VASCULAR Postsurgical changes related to a gastric bypass procedure with a Roux-en-Y gastrojejunostomy. Minimal stranding near the gastrojejunostomy is likely postoperative change. No evidence for bowel inflammation or obstruction. Cholelithiasis. No evidence for gallbladder inflammation or biliary dilatation. Electronically Signed   By: Richarda Overlie M.D.   On: 03/29/2017 14:18    Subjective:  Patient states that he has been having some dark sticky stools since his colonoscopy yesterday.  His last stools have been more like blood clots mixed with liquid.  He denies lightheadedness, dizziness, shortness of breath.  He has been coughing more than before and is having some orthopnea.  Swelling in his ankles might be a little worse.   Discharge Exam: Vitals:   03/31/17 1212 03/31/17 1500  BP: 114/76 114/63  Pulse: (!) 115 83  Resp: 18 18  Temp: 97.7 F (36.5 C) (!) 97.5 F (36.4 C)  SpO2: 99% 96%   Vitals:   03/31/17 1120 03/31/17 1156 03/31/17 1212 03/31/17 1500  BP: 114/83 118/88 114/76 114/63  Pulse: 70 (!) 109 (!) 115 83  Resp: 18 18 18 18   Temp: 97.6 F (36.4 C) (!) 97.5 F (36.4 C) 97.7 F (36.5 C) (!) 97.5 F (36.4 C)  TempSrc: Axillary Axillary Axillary  Axillary  SpO2: 100% 96% 99% 96%  Weight:      Height:        General exam:  Adult male.  No acute distress.  HEENT:  NCAT, MMM Respiratory system: Clear to auscultation bilaterally but has a wheezy cough and coughs frequently during exam Cardiovascular system: Regular rate and rhythm, normal S1/S2. No murmurs, rubs, gallops or clicks.  Warm extremities Gastrointestinal system: Normal active bowel sounds, soft, nondistended, nontender. MSK:  Normal tone and bulk, 1+ pitting bilateral lower extremity edema Neuro:  Grossly intact   The results of significant diagnostics from this hospitalization (including imaging, microbiology, ancillary and laboratory) are listed below for reference.     Microbiology: No results found for this or any previous visit (from the past 240 hour(s)).   Labs: BNP (last 3 results) No results for input(s): BNP in the last 8760 hours. Basic Metabolic Panel: Recent Labs  Lab 03/28/17 1655 03/29/17 0521 03/30/17 0052 03/31/17 0533  NA 140 139 138 137  K 4.6 4.0 3.9 3.8  CL 106 106 108 107  CO2 26 28 23 25   GLUCOSE 168* 142* 124* 186*  BUN 30* 31* 22* 22*  CREATININE 1.03 0.99 1.03 0.98  CALCIUM 8.9 8.1* 8.0* 7.9*   Liver Function Tests: Recent Labs  Lab 03/28/17  1655 03/29/17 0521  AST 25 19  ALT 18 16*  ALKPHOS 71 61  BILITOT 1.2 1.0  PROT 7.0 6.0*  ALBUMIN 3.7 3.4*   No results for input(s): LIPASE, AMYLASE in the last 168 hours. No results for input(s): AMMONIA in the last 168 hours. CBC: Recent Labs  Lab 03/28/17 1655 03/29/17 0521  03/30/17 0052 03/30/17 0658 03/30/17 1330 03/31/17 0533 03/31/17 1639  WBC 9.9 8.4  --  10.8*  --   --  7.4  --   HGB 10.6* 8.5*   < > 8.2* 8.1* 8.0* 6.8* 9.0*  HCT 33.4* 26.5*   < > 25.3* 25.3* 24.5* 21.1* 27.4*  MCV 90.5 90.8  --  91.3  --   --  90.6  --   PLT 253 224  --  203  --   --  173  --    < > = values in this interval not displayed.   Cardiac Enzymes: No results for input(s):  CKTOTAL, CKMB, CKMBINDEX, TROPONINI in the last 168 hours. BNP: Invalid input(s): POCBNP CBG: Recent Labs  Lab 03/30/17 1655 03/30/17 2146 03/31/17 0535 03/31/17 1045 03/31/17 1654  GLUCAP 143* 242* 174* 213* 167*   D-Dimer No results for input(s): DDIMER in the last 72 hours. Hgb A1c No results for input(s): HGBA1C in the last 72 hours. Lipid Profile No results for input(s): CHOL, HDL, LDLCALC, TRIG, CHOLHDL, LDLDIRECT in the last 72 hours. Thyroid function studies No results for input(s): TSH, T4TOTAL, T3FREE, THYROIDAB in the last 72 hours.  Invalid input(s): FREET3 Anemia work up No results for input(s): VITAMINB12, FOLATE, FERRITIN, TIBC, IRON, RETICCTPCT in the last 72 hours. Urinalysis    Component Value Date/Time   COLORURINE YELLOW 03/10/2015 0831   APPEARANCEUR CLEAR 03/10/2015 0831   LABSPEC 1.009 03/10/2015 0831   PHURINE 6.5 03/10/2015 0831   GLUCOSEU NEGATIVE 03/10/2015 0831   HGBUR NEGATIVE 03/10/2015 0831   BILIRUBINUR NEGATIVE 03/10/2015 0831   KETONESUR NEGATIVE 03/10/2015 0831   PROTEINUR NEGATIVE 03/10/2015 0831   UROBILINOGEN 1.0 08/08/2007 1244   NITRITE NEGATIVE 03/10/2015 0831   LEUKOCYTESUR NEGATIVE 03/10/2015 0831   Sepsis Labs Invalid input(s): PROCALCITONIN,  WBC,  LACTICIDVEN   Time coordinating discharge: Over 30 minutes  SIGNED:   Renae Fickle, MD  [facility] 03/31/2017, 7:42 PM Pager   If 7PM-7AM, please contact night-coverage [email or web address] Password [email or web address]

## 2017-04-01 ENCOUNTER — Telehealth: Payer: Self-pay | Admitting: Physician Assistant

## 2017-04-01 LAB — TYPE AND SCREEN
ABO/RH(D): B POS
ANTIBODY SCREEN: NEGATIVE
UNIT DIVISION: 0
Unit division: 0
Unit division: 0

## 2017-04-01 LAB — BPAM RBC
Blood Product Expiration Date: 201812182359
Blood Product Expiration Date: 201812312359
Blood Product Expiration Date: 201812312359
ISSUE DATE / TIME: 201812130835
ISSUE DATE / TIME: 201812131143
UNIT TYPE AND RH: 7300
Unit Type and Rh: 1700
Unit Type and Rh: 7300

## 2017-04-01 NOTE — Telephone Encounter (Signed)
Patient wife states that pt was [facility] and [facility] bleed yesterday. Now pt is [facility] and pt wife states they do not know why pt is there. Pt wife would like a call from nurse or Amy due to she states Amy was in charge of this and gave pt paperwork and pictures to take to [facility].

## 2017-04-01 NOTE — Anesthesia Postprocedure Evaluation (Signed)
Anesthesia Post Note  Patient: Philip [name]  Procedure(s) Performed: ESOPHAGOGASTRODUODENOSCOPY (EGD) WITH PROPOFOL (N/A )     Patient location during evaluation: PACU Anesthesia Type: MAC Level of consciousness: awake and alert Pain management: pain level controlled Vital Signs Assessment: post-procedure vital signs reviewed and stable Respiratory status: spontaneous breathing, nonlabored ventilation, respiratory function stable and patient connected to nasal cannula oxygen Cardiovascular status: stable and blood pressure returned to baseline Postop Assessment: no apparent nausea or vomiting Anesthetic complications: no    Last Vitals:  Vitals:   03/31/17 1212 03/31/17 1500  BP: 114/76 114/63  Pulse: (!) 115 83  Resp: 18 18  Temp: 36.5 C (!) 36.4 C  SpO2: 99% 96%    Last Pain:  Vitals:   03/31/17 1500  TempSrc: Axillary  PainSc:                  Javed Cotto EDWARD

## 2017-04-04 ENCOUNTER — Encounter: Payer: Self-pay | Admitting: Physician Assistant

## 2017-04-04 ENCOUNTER — Ambulatory Visit (INDEPENDENT_AMBULATORY_CARE_PROVIDER_SITE_OTHER): Payer: Managed Care, Other (non HMO) | Admitting: Physician Assistant

## 2017-04-04 ENCOUNTER — Telehealth: Payer: Self-pay | Admitting: Cardiology

## 2017-04-04 VITALS — BP 105/71 | HR 52 | Ht 77.0 in | Wt 360.8 lb

## 2017-04-04 DIAGNOSIS — D62 Acute posthemorrhagic anemia: Secondary | ICD-10-CM | POA: Diagnosis not present

## 2017-04-04 DIAGNOSIS — I10 Essential (primary) hypertension: Secondary | ICD-10-CM

## 2017-04-04 DIAGNOSIS — I5032 Chronic diastolic (congestive) heart failure: Secondary | ICD-10-CM | POA: Diagnosis not present

## 2017-04-04 DIAGNOSIS — I4819 Other persistent atrial fibrillation: Secondary | ICD-10-CM

## 2017-04-04 DIAGNOSIS — I481 Persistent atrial fibrillation: Secondary | ICD-10-CM | POA: Diagnosis not present

## 2017-04-04 MED ORDER — LACTATED RINGERS IV SOLN
INTRAVENOUS | Status: DC
Start: ? — End: 2017-04-04

## 2017-04-04 MED ORDER — FUROSEMIDE 40 MG PO TABS: 40.0000 mg | ORAL_TABLET | Freq: Every day | ORAL | 6 refills | Status: DC

## 2017-04-04 MED ORDER — DEXTROSE 50 % IV SOLN
12.50 | INTRAVENOUS | Status: DC
Start: ? — End: 2017-04-04

## 2017-04-04 MED ORDER — LIDOCAINE HCL (PF) 1 % IJ SOLN
0.50 | INTRAMUSCULAR | Status: DC
Start: ? — End: 2017-04-04

## 2017-04-04 MED ORDER — ONDANSETRON HCL 4 MG/2ML IJ SOLN
4.00 | INTRAMUSCULAR | Status: DC
Start: ? — End: 2017-04-04

## 2017-04-04 MED ORDER — PANTOPRAZOLE SODIUM 40 MG IV SOLR
40.00 | INTRAVENOUS | Status: DC
Start: 2017-04-02 — End: 2017-04-04

## 2017-04-04 MED ORDER — ACETAMINOPHEN 325 MG PO TABS
650.00 | ORAL_TABLET | ORAL | Status: DC
Start: ? — End: 2017-04-04

## 2017-04-04 MED ORDER — PROMETHAZINE HCL 25 MG/ML IJ SOLN
6.25 | INTRAMUSCULAR | Status: DC
Start: ? — End: 2017-04-04

## 2017-04-04 MED ORDER — GLUCAGON HCL RDNA (DIAGNOSTIC) 1 MG IJ SOLR
1.00 | INTRAMUSCULAR | Status: DC
Start: ? — End: 2017-04-04

## 2017-04-04 MED ORDER — GENERIC EXTERNAL MEDICATION
1.00 | Status: DC
Start: ? — End: 2017-04-04

## 2017-04-04 MED ORDER — INSULIN LISPRO 100 UNIT/ML ~~LOC~~ SOLN
0.00 | SUBCUTANEOUS | Status: DC
Start: 2017-04-02 — End: 2017-04-04

## 2017-04-04 NOTE — Telephone Encounter (Signed)
New message  Patient calling with concerns about questionable afib diagnosis. Please call  .Pt c/o of Chest Pain: STAT if CP now or developed within 24 hours  1. Are you having CP right now? No 2. Are you experiencing any other symptoms (ex. SOB, nausea, vomiting, sweating)? No 3. How long have you been experiencing CP? 2 days 4. Is your CP continuous or coming and going? Coming and going 5. Have you taken Nitroglycerin? No ?

## 2017-04-04 NOTE — Telephone Encounter (Signed)
Returned call to patient.He stated he [facility] a [facility] bleed [facility].Stated when he got to [facility] he was told he was in afib.Stated this is new, he has never had afib.He also has been having chest pain with pain radiating down both arms off and on since last week.No pain at present.Appointment scheduled with Theodore Demarkhonda Barrett PA this afternoon at 2:00 pm.

## 2017-04-04 NOTE — Telephone Encounter (Signed)
Noted  

## 2017-04-04 NOTE — Patient Instructions (Signed)
Medication Instructions:  Your physician recommends that you continue on your current medications as directed. Please refer to the Current Medication list given to you today.  If you need a refill on your cardiac medications before your next appointment, please call your pharmacy.  Special Instructions: TAKE DAILY WEIGHTS  2,000 LOW SODIUM DIET  2 LITERS DAILY FLUID OF ALL LIQUIDS  PLEASE USE MY CHART TO INFORM DR HOCHREIN WHEN YOU ARE CLEARED TO TAKE THE BLOOD THINNERS.  Follow-Up: Your physician wants you to follow-up in: FIRST AVAILABLE WITH [facility]    Thank you for choosing [facility]!!     Low-Sodium Eating Plan Sodium, which is an element that makes up salt, helps you maintain a healthy balance of fluids in your body. Too much sodium can increase your blood pressure and cause fluid and waste to be held in your body. Your health care provider or dietitian may recommend following this plan if you have high blood pressure (hypertension), kidney disease, liver disease, or heart failure. Eating less sodium can help lower your blood pressure, reduce swelling, and protect your heart, liver, and kidneys. What are tips for following this plan? General guidelines  Most people on this plan should limit their sodium intake to 1,500-2,000 mg (milligrams) of sodium each day. Reading food labels  The Nutrition Facts label lists the amount of sodium in one serving of the food. If you eat more than one serving, you must multiply the listed amount of sodium by the number of servings.  Choose foods with less than 140 mg of sodium per serving.  Avoid foods with 300 mg of sodium or more per serving. Shopping  Look for lower-sodium products, often labeled as "low-sodium" or "no salt added."  Always check the sodium content even if foods are labeled as "unsalted" or "no salt added".  Buy fresh foods. ? Avoid canned foods and premade or frozen meals. ? Avoid canned,  cured, or processed meats  Buy breads that have less than 80 mg of sodium per slice. Cooking  Eat more home-cooked food and less restaurant, buffet, and fast food.  Avoid adding salt when cooking. Use salt-free seasonings or herbs instead of table salt or sea salt. Check with your health care provider or pharmacist before using salt substitutes.  Cook with plant-based oils, such as canola, sunflower, or olive oil. Meal planning  When eating at a restaurant, ask that your food be prepared with less salt or no salt, if possible.  Avoid foods that contain MSG (monosodium glutamate). MSG is sometimes added to Cong[place]inese food, bouillon, and some canned foods. What foods are recommended? The items listed may not be a complete list. Talk with your dietitian about what dietary choices are best for you. Grains Low-sodium cereals, including oats, puffed wheat and rice, and shredded wheat. Low-sodium crackers. Unsalted rice. Unsalted pasta. Low-sodium bread. Whole-grain breads and whole-grain pasta. Vegetables Fresh or frozen vegetables. "No salt added" canned vegetables. "No salt added" tomato sauce and paste. Low-sodium or reduced-sodium tomato and vegetable juice. Fruits Fresh, frozen, or canned fruit. Fruit juice. Meats and other protein foods Fresh or frozen (no salt added) meat, poultry, seafood, and fish. Low-sodium canned tuna and salmon. Unsalted nuts. Dried peas, beans, and lentils without added salt. Unsalted canned beans. Eggs. Unsalted nut butters. Dairy Milk. Soy milk. Cheese that is naturally low in sodium, such as ricotta cheese, fresh mozzarella, or Swiss cheese Low-sodium or reduced-sodium cheese. Cream cheese. Yogurt. Fats and oils Unsalted butter.  Unsalted margarine with no trans fat. Vegetable oils such as canola or olive oils. Seasonings and other foods Fresh and dried herbs and spices. Salt-free seasonings. Low-sodium mustard and ketchup. Sodium-free salad dressing. Sodium-free  light mayonnaise. Fresh or refrigerated horseradish. Lemon juice. Vinegar. Homemade, reduced-sodium, or low-sodium soups. Unsalted popcorn and pretzels. Low-salt or salt-free chips. What foods are not recommended? The items listed may not be a complete list. Talk with your dietitian about what dietary choices are best for you. Grains Instant hot cereals. Bread stuffing, pancake, and biscuit mixes. Croutons. Seasoned rice or pasta mixes. Noodle soup cups. Boxed or frozen macaroni and cheese. Regular salted crackers. Self-rising flour. Vegetables Sauerkraut, pickled vegetables, and relishes. Olives. [place] fries. Onion rings. Regular canned vegetables (not low-sodium or reduced-sodium). Regular canned tomato sauce and paste (not low-sodium or reduced-sodium). Regular tomato and vegetable juice (not low-sodium or reduced-sodium). Frozen vegetables in sauces. Meats and other protein foods Meat or fish that is salted, canned, smoked, spiced, or pickled. Bacon, ham, sausage, hotdogs, corned beef, chipped beef, packaged lunch meats, salt pork, jerky, pickled herring, anchovies, regular canned tuna, sardines, salted nuts. Dairy Processed cheese and cheese spreads. Cheese curds. Blue cheese. Feta cheese. String cheese. Regular cottage cheese. Buttermilk. Canned milk. Fats and oils Salted butter. Regular margarine. Ghee. Bacon fat. Seasonings and other foods Onion salt, garlic salt, seasoned salt, table salt, and sea salt. Canned and packaged gravies. Worcestershire sauce. Tartar sauce. Barbecue sauce. Teriyaki sauce. Soy sauce, including reduced-sodium. Steak sauce. Fish sauce. Oyster sauce. Cocktail sauce. Horseradish that you find on the shelf. Regular ketchup and mustard. Meat flavorings and tenderizers. Bouillon cubes. Hot sauce and Tabasco sauce. Premade or packaged marinades. Premade or packaged taco seasonings. Relishes. Regular salad dressings. Salsa. Potato and tortilla chips. Corn chips and puffs.  Salted popcorn and pretzels. Canned or dried soups. Pizza. Frozen entrees and pot pies. Summary  Eating less sodium can help lower your blood pressure, reduce swelling, and protect your heart, liver, and kidneys.  Most people on this plan should limit their sodium intake to 1,500-2,000 mg (milligrams) of sodium each day.  Canned, boxed, and frozen foods are high in sodium. Restaurant foods, fast foods, and pizza are also very high in sodium. You also get sodium by adding salt to food.  Try to cook at home, eat more fresh fruits and vegetables, and eat less fast food, canned, processed, or prepared foods. This information is not intended to replace advice given to you by your health care provider. Make sure you discuss any questions you have with your health care provider. Document Released: 09/25/2001 Document Revised: 03/29/2016 Document Reviewed: 03/29/2016 Elsevier Interactive Patient Education  2017 Elsevier Inc.  Fluid Restriction Some health conditions may require you to restrict your fluid intake. This means that you need to limit the amount of fluid you drink each day. When you have a fluid restriction, you must carefully measure and keep track of the amount of fluid you drink. Your health care provider will identify the specific amount of fluid you are allowed each day. This amount may depend on several things, such as:  The amount of urine you produce in a day.  How much fluid you are keeping (retaining) in your body.  Your blood pressure.  What is my plan? Your health care provider recommends that you limit your fluid intake to__2 LITERS__ per day. What counts toward my fluid intake? Your fluid intake includes all liquids that you drink, as well as any foods that become  liquid at room temperature. The following are examples of some fluids that you will have to restrict:  Tea, coffee, soda, lemonade, milk, water, juice, sport drinks, and nutritional supplement  beverages.  Alcoholic beverages.  Cream.  Gravy.  Ice cubes.  Soup and broth.  The following are examples of foods that become liquid at room temperature. These foods will also count toward your fluid intake.  Ice cream and ice milk.  Frozen yogurt and sherbet.  Frozen ice pops.  Flavored gelatin.  How do I keep track of my fluid intake? Each morning, fill a jug with the amount of water that equals the amount of fluid you are allowed for the day. You can use this water as a guideline for fluid allowance. Each time you take in any form of fluid, including ice cubes and foods that become liquid at room temperature, pour an equal amount of water out of the container. This helps you to see how much fluid you are taking in. It also helps you to see how much of your fluid intake is left for the rest of the day. The following conversions may also be helpful in measuring your fluid intake:  1 cup equals 8 oz (240 mL).   cup equals 6 oz (180 mL).  ? cup equals 5? oz (160 mL).   cup equals 4 oz (120 mL).  ? cup equals 2? oz (80 mL).   cup equals 2 oz (60 mL).  2 Tbsp equals 1 oz (30 mL).  What home care instructions should I follow while restricting fluids?  Make sure that you stay within the recommended limit each day. Always measure and keep track of your fluids, as well as any foods that turn liquid at room temperature.  Use small cups and glasses and learn to sip fluids slowly.  Add a slice of fresh lemon or lemon juice to water or ice. This helps to satisfy your thirst.  Freeze fruit juice or water in an ice cube tray. Use this as part of your fluid allowance. These cubes are useful for quenching your thirst. Measure the amount of liquid in each ice cube prior to freezing so you can subtract this amount from your day's allowance when you consume each frozen cube.  Try frozen fruits between meals, such as grapes or strawberries.  Swallow your pills along with meals or  soft foods, such as applesauce or mashed potatoes. This helps you to save your fluid allowance for something that you enjoy.  Weigh yourself every day. Keeping track of your daily weight can help you and your health care provider to notice as soon as possible if you are retaining too much fluid in your body. ? Weigh yourself every morning after you urinate but before you eat breakfast. ? Wear the same amount of clothing each time you weigh yourself. ? Write down your daily weight. Give this weight record to your health care provider. If your weight is going up, you may be retaining too much fluid. Every 2 cups (480 mL) of fluid retained in the body becomes an extra 1 lb (0.45 kg) of body weight.  Avoid salty foods. These foods make you thirsty and make fluid control more difficult.  Brush your teeth often or rinse your mouth with mouthwash to help your dry mouth. Lemon wedges, hard sour candies, chewing gum, or breath spray may also help to moisten your mouth.  Keep the temperature in your home at a cooler level. Dry air increases  thirst, so keep the air in your home as humid as possible.  Avoid being out in the hot sun, which can cause you to sweat and become thirsty. What are some signs that I may be taking in too much fluid? You may be taking in too much fluid if:  Your weight increases. Contact your health care provider if your weight increases 3 lb or more in a day or if it increases 5 lb or more in a week.  Your face, hands, legs, feet, and belly (abdomen) start to swell.  You have trouble breathing.  This information is not intended to replace advice given to you by your health care provider. Make sure you discuss any questions you have with your health care provider. Document Released: 01/31/2007 Document Revised: 09/11/2015 Document Reviewed: 09/04/2013 Elsevier Interactive Patient Education  [employer].

## 2017-04-04 NOTE — Progress Notes (Signed)
Cardiology Office Note   Date:  04/04/2017   ID:  Philip [name], DOB 1960/05/17, MRN [id number]  PCP:  [name], MD  Cardiologist: Dr. Antoine Poche, 01/21/2017 Theodore Demark, PA-C   Chief Complaint  Patient presents with  . Chest Pain    pt states the chest pain radiates down both arms   . Shortness of Breath    states only when getting up and moving and when bending over   . Follow-up    states only when getting up moving fast causes him to feel light headed, also states he has a coughing for at least 6 weeks    . Edema    states sometimes in both ankles     History of Present Illness: Philip [name] is a 56 y.o. 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 He was given IV Lasix and metolazone prior to leaving. Wt 372 lbs. Admit 12/13-12/15 to [facility] for GI eval, enteroscopy. Enteroscopy could not be done, is scheduled for this at [facility] 12/18.  12/17 phone note regarding possible atrial fibrillation diagnosis as well as intermittent chest pain, appointment made  Philip [name] presents for cardiology evaluation.  He is unaware of the arrhythmia. He has DOE, when he is active.   He builds up fluid. He takes the metolazone every week-10 days and improves his fluid status. He eats out frequently. He does not weigh daily.  He is not paying attention to the amount of sodium in the foods that he eats.  He feels sure that he drinks more than 2 L daily.  He is having spasm type pain that is in his posterior medial left shoulder and goes down his arm.  He had 1 of these while he was here, it is a very sharp pain that goes away almost immediately.  It will come frequently  for 5-10 minutes and then abate and resolved.  He has never taken any medication for it.  It is not exertional.  Certain movements make it a little worse, but in general is not positional.  His bowel movements have normalized.   Past Medical History:  Diagnosis Date  . Arthritis   . Dental crowns present   . Diabetes mellitus    NIDDM  . Diabetic foot ulcer (HCC) 08/2011   right foot  . Dysrhythmia   . Fatty liver   . Gastric ulcer    no current problems  . GERD (gastroesophageal reflux disease)   . Gout   . 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 History of bronchitis as a child   . 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 Hx of bacterial endocarditis 11/2003  . Hypertension    under control; states med. is really to protect kidneys due to diabetes  . Neuropathy    feet bilat   . Obesity   . Pneumonia    history of as child   . Seasonal allergies   . Sleep apnea    uses CPAP nightly  . 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 adenoid surgery     . AORTIC VALVE REPLACEMENT  11/2003  . CARDIAC CATHETERIZATION    . COLONOSCOPY WITH PROPOFOL N/A 03/30/2017   Procedure: COLONOSCOPY WITH PROPOFOL;  Surgeon: Benancio DeedsArmbruster, Steven P, MD;  Location: [facility];  Service: Gastroenterology;  Laterality: N/A;  . ESOPHAGOGASTRODUODENOSCOPY (EGD) WITH PROPOFOL N/A 03/29/2017   Procedure: ESOPHAGOGASTRODUODENOSCOPY (EGD) WITH PROPOFOL;  Surgeon: Benancio DeedsArmbruster, Steven P, MD;  Location: [facility];  Service: Gastroenterology;  Laterality: N/A;  . GASTRIC BYPASS  03/2004  . GIVENS CAPSULE STUDY N/A 03/30/2017   Procedure: GIVENS CAPSULE STUDY;  Surgeon: Benancio DeedsArmbruster, Steven P, MD;  Location: [facility];  Service: Gastroenterology;  Laterality: N/A;  . KNEE ARTHROSCOPY  08/09/2007 - left   07/15/2003 - right  . METATARSAL OSTEOTOMY  03/20/2010    right 1st MT; gastroc soleus recession  . NASAL SINUS SURGERY    . TENDON RELEASE  09/09/2011   Procedure: HEEL CORD LENGTHENING;  Surgeon: Toni ArthursJohn Hewitt, MD;  Location: [facility];  Service: Orthopedics;  Laterality: Right;  Righ tachilles tendon lengthening  . TOE FUSION  07/12/2005   left 3rd toe PIP and DIP fusion  . TONSILLECTOMY AND ADENOIDECTOMY    . TOTAL KNEE ARTHROPLASTY Left 03/18/2015   Procedure: LEFT TOTAL KNEE ARTHROPLASTY;  Surgeon: Durene RomansMatthew Olin, MD;  Location: [facility];  Service: Orthopedics;  Laterality: Left;    Current Outpatient Medications  Medication Sig Dispense Refill  . Cetirizine HCl (ZYRTEC ALLERGY PO) Take 10 mg by mouth daily. Reported on 09/05/2015    . furosemide (LASIX) 10 MG/ML injection Inject 4 mLs (40 mg total) into the vein 2 (two) times daily. 4 mL 0  . metolazone (ZAROXOLYN) 5 MG tablet Take 5 mg by mouth once a week. Saturdays    . NEXIUM 40 MG capsule Take 1 tablet by mouth daily. PM    . pantoprazole (PROTONIX) 40 MG tablet Take 1 tablet by mouth daily.    . potassium chloride (KLOR-CON) 8 MEQ tablet Take 1 tablet by mouth daily.    . ramipril (ALTACE) 5 MG capsule Take 1 capsule by mouth daily.    . simvastatin (ZOCOR) 10 MG tablet Take 1 tablet by mouth daily.    . sitaGLIPtin-metformin (JANUMET) 50-1000 MG tablet Take 1 tablet by mouth daily.     No current facility-administered medications for this visit.     Allergies:   Nsaids; Penicillins; and Other    Social History:  The patient  reports that he quit smoking about 6 years ago. His smoking use included cigars, pipe, and cigarettes. He has a 2.00 pack-year smoking history. he has never used smokeless tobacco. He reports that he drinks alcohol. He reports that he does not use drugs.   Family History:  The patient's family history includes Alzheimer's disease in his mother; Colon cancer in his unknown relative; Diabetes in his father.    ROS:  Please see the history of present  illness. All other systems are reviewed and negative.    PHYSICAL EXAM: VS:  BP 105/71   Pulse (!) 52   Ht 6\' 5"  (1.956 m)   Wt (!) 360 lb 12.8 oz (163.7 kg)   BMI 42.78 kg/m  , BMI Body mass index is 42.78 kg/m. GEN: Well nourished, well developed, male in no acute distress  HEENT: normal for age  Neck: JVD 9 cm, no carotid bruit, no masses Cardiac: Regular R and R; soft murmur, no rubs, or gallops Respiratory: Decreased  breath sounds bases bilaterally, normal work of breathing GI: soft, nontender, nondistended, + BS MS: no deformity or atrophy; trace-1+ lower extremity edema; distal pulses are 2+ in all 4 extremities   Skin: warm and dry, no rash Neuro:  Strength and sensation are intact Psych: euthymic mood, full affect   EKG:  EKG is ordered today. The ekg ordered today demonstrates   ECHO: 12/31/2016 - Left ventricle: The cavity size was normal. Systolic function was   vigorous. The estimated ejection fraction was in the range of 65%   to 70%. - Aortic valve: AV prosthesis appears to open well. Peak and mean   gradients through the valve are 11 and 7 mm Hg respectively.   Valve area (VTI): 2.26 cm^2. Valve area (Vmax): 2.33 cm^2. Valve   area (Vmean): 2.18 cm^2. - Mitral valve: Calcified annulus. Mildly thickened leaflets .   There was mild regurgitation.   Recent Labs: 03/29/2017: ALT 16 03/31/2017: BUN 22; Creatinine, Ser 0.98; Hemoglobin 9.0; Platelets 173; Potassium 3.8; Sodium 137    Lipid Panel No results found for: CHOL, TRIG, HDL, CHOLHDL, VLDL, LDLCALC, LDLDIRECT   Wt Readings from Last 3 Encounters:  04/04/17 (!) 360 lb 12.8 oz (163.7 kg)  03/31/17 (!) 372 lb 9.2 oz (169 kg)  01/21/17 (!) 361 lb 12.8 oz (164.1 kg)     Other studies Reviewed: Additional studies/ records that were reviewed today include: Office notes, hospital records and testing.  ASSESSMENT AND PLAN:  1.  Persistent atrial fibrillation: His heart rate is controlled.  He is not  on any rate lowering medications and I will not start any.  His blood pressure is well controlled.  He never has palpitations.    2.  Hypertension: His blood pressure has been on the low side of normal since his hospitalization.  We will not make any medication changes.  He is getting a little lightheaded when he stands up, but this may be related to his anemia.  3.  Chronic diastolic CHF: Because of the multiple hospitalizations and procedures, he has lost weight.  He was volume overloaded but was diuresed with IV Lasix and possibly metolazone, weight is back to baseline.  He was given information on a low-sodium diet and asked to limit fluids to 2 L daily.  Continue Lasix 40 mg daily.  4.  Anemia: He has had GI blood loss and is iron deficient.  The final procedure is tomorrow at [facility].   5.  Anticoagulation: I explained the mechanism behind his increased stroke risk from the atrial fibrillation. This patients CHA2DS2-VASc Score and unadjusted Ischemic Stroke Rate (% per year) is equal to 3.2 % stroke rate/year from a score of 3 Above score calculated as 1 point each if present [CHF, HTN, DM, Vascular=MI/PAD/Aortic Plaque, Age if 65-74, or Male], 2 points each if present [Age > 75, or Stroke/TIA/TE].  Anticoagulation is indicated, but cannot be started until he is cleared from a GI standpoint.  The patient is to contact us once the GI procedures are completed and let us know.  **Dr. Antoine Poche to advise if you would like the patient on Xarelto or Eliquis.     Current medicines are reviewed at length with the patient today.  The patient has concerns regarding medicines. Concerns were addressed The following changes have been made:  no change  Labs/ tests ordered today include:  No orders of the defined types were placed in this encounter.    Disposition:   FU with Dr. Antoine Poche  Melida QuitterSigned, Feather Berrie, PA-C  04/04/2017 3:02 PM    [facility] Phone: [phone]  [phone]; Fax: [phone]  This note was written with the assistance of speech recognition software. Please excuse any transcriptional errors.

## 2017-04-06 ENCOUNTER — Encounter: Payer: Self-pay | Admitting: Cardiology

## 2017-04-07 ENCOUNTER — Telehealth: Payer: Self-pay | Admitting: Cardiology

## 2017-04-07 DIAGNOSIS — Z5181 Encounter for therapeutic drug level monitoring: Secondary | ICD-10-CM

## 2017-04-07 DIAGNOSIS — D62 Acute posthemorrhagic anemia: Secondary | ICD-10-CM

## 2017-04-07 DIAGNOSIS — K250 Acute gastric ulcer with hemorrhage: Secondary | ICD-10-CM

## 2017-04-07 NOTE — Telephone Encounter (Signed)
Patient said that he is no longer bleeding and has been told by [facility] that he can start the anticoagulant. Patient advised that message will be sent to Dr. Antoine PocheHochrein.

## 2017-04-07 NOTE — Telephone Encounter (Signed)
New message  Pt called requesting to speak with RN to discuss new [facility]. Please call back to discuss

## 2017-04-07 NOTE — Telephone Encounter (Signed)
Start Xarelto 20 mg daily.  Needs to watch his BMs closely given his recent bleeding.  I would suggest a follow up CBC as well in one month.  Disp number 30 with 11 refills.  Take with largest meal of the day.

## 2017-04-08 MED ORDER — RIVAROXABAN 20 MG PO TABS: 20.0000 mg | ORAL_TABLET | Freq: Every day | ORAL | 11 refills | Status: DC

## 2017-04-08 NOTE — Telephone Encounter (Signed)
Patient informed and verbalized understanding of plan. Lab work order placed for CBC in one month.

## 2017-04-16 LAB — CBC
Hematocrit: 25.7 % — ABNORMAL LOW (ref 37.5–51.0)
Hemoglobin: 8.4 g/dL — ABNORMAL LOW (ref 13.0–17.7)
MCH: 27.7 pg (ref 26.6–33.0)
MCHC: 32.7 g/dL (ref 31.5–35.7)
MCV: 85 fL (ref 79–97)
PLATELETS: 301 10*3/uL (ref 150–379)
RBC: 3.03 x10E6/uL — AB (ref 4.14–5.80)
RDW: 16.2 % — ABNORMAL HIGH (ref 12.3–15.4)
WBC: 9.1 10*3/uL (ref 3.4–10.8)

## 2017-04-18 ENCOUNTER — Encounter: Payer: Self-pay | Admitting: Cardiology

## 2017-04-18 ENCOUNTER — Telehealth: Payer: Self-pay | Admitting: *Deleted

## 2017-04-18 ENCOUNTER — Telehealth: Payer: Self-pay | Admitting: Cardiology

## 2017-04-18 DIAGNOSIS — D62 Acute posthemorrhagic anemia: Secondary | ICD-10-CM

## 2017-04-18 NOTE — Telephone Encounter (Signed)
-----   Message from Rollene RotundaJames Hochrein, MD sent at 04/18/2017 11:03 AM EST ----- Hgb is reduced.  Needs to get this repeated on Thursday.  Call Mr. Smelser with the results and send results to Angelica ChessmanAguiar, Rafaela M, MD

## 2017-04-18 NOTE — Telephone Encounter (Signed)
Unable to reach caller at number/extension provided.

## 2017-04-18 NOTE — Telephone Encounter (Signed)
Patient made aware of results and verbalized his understanding. Lab orders have been placed. He will come in on Thursday and have repeat labs.

## 2017-04-18 NOTE — Telephone Encounter (Signed)
Please call,concerning pt,Philip [name].

## 2017-04-20 NOTE — Telephone Encounter (Signed)
Spoke with Rosanne SackKasey and she was concerned about labs/low hgb but patient already called regarding labs. Patient is to return for repeat labs tomorrow

## 2017-04-22 ENCOUNTER — Ambulatory Visit: Payer: Managed Care, Other (non HMO) | Admitting: Cardiology

## 2017-04-22 LAB — CBC
HEMOGLOBIN: 8.3 g/dL — AB (ref 13.0–17.7)
Hematocrit: 26.9 % — ABNORMAL LOW (ref 37.5–51.0)
MCH: 25.9 pg — ABNORMAL LOW (ref 26.6–33.0)
MCHC: 30.9 g/dL — ABNORMAL LOW (ref 31.5–35.7)
MCV: 84 fL (ref 79–97)
Platelets: 242 10*3/uL (ref 150–379)
RBC: 3.2 x10E6/uL — ABNORMAL LOW (ref 4.14–5.80)
RDW: 16.3 % — ABNORMAL HIGH (ref 12.3–15.4)
WBC: 8.2 10*3/uL (ref 3.4–10.8)

## 2017-04-28 ENCOUNTER — Encounter: Payer: Self-pay | Admitting: Nurse Practitioner

## 2017-04-28 ENCOUNTER — Ambulatory Visit (INDEPENDENT_AMBULATORY_CARE_PROVIDER_SITE_OTHER): Payer: Managed Care, Other (non HMO) | Admitting: Nurse Practitioner

## 2017-04-28 VITALS — BP 118/64 | HR 97 | Ht 77.0 in | Wt 368.8 lb

## 2017-04-28 DIAGNOSIS — K922 Gastrointestinal hemorrhage, unspecified: Secondary | ICD-10-CM | POA: Diagnosis not present

## 2017-04-28 DIAGNOSIS — D62 Acute posthemorrhagic anemia: Secondary | ICD-10-CM

## 2017-04-28 NOTE — Progress Notes (Addendum)
Chief Complaint:  Hospital follow up   HPI: Patient is a 57 year old male with a history of AVR, DM 2, hypertension, obesity and gastric bypass, known to Dr. Marina GoodellPerry.  He was hospitalized in mid December with upper GI bleed / melena. . He was taking daily baby ASA, no other NSAIDs. EGD and colonoscopy unrevealing though prep for colonoscopy was poor.  CT angios negative.  Patient ultimately underwent capsule endoscopy.  There was active bleeding seen in mid to distal small bowel but source could not be visualized.  It was felt that patient would need a deep enteroscopy so [facility] was [facility]. [facility] where he says an enteroscopy was done and negative. He was discharged home with outpatient appt for what sounds like a deep enteroscopy. Per patient nothing was found on the study but there was question about where study was complete. The results are not in Care Everywhere.   Patient is here for follow up. He is frustrated at lack of answers. To complicate thing further, while at [facility] he was diagnosed with AFIB and is now on oral anti-coagulants. So far he hasn't had any further overt bleeding.    Past Medical History:  Diagnosis Date  . Arthritis   . Dental crowns present   . Diabetes mellitus    NIDDM  . Diabetic foot ulcer (HCC) 08/2011   right foot  . Dysrhythmia   . Fatty liver   . Gastric ulcer    no current problems  . GERD (gastroesophageal reflux disease)   . Gout   . 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 History of bronchitis as a child   . 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 Hx of bacterial endocarditis 11/2003  . Hypertension    under control; states med. is really to protect kidneys due to diabetes  . Neuropathy    feet bilat   . Obesity   . Pneumonia    history of as child   . Seasonal allergies   . Sleep apnea    uses CPAP nightly  . Tightness of heel cord, right  08/2011  . Toe deformity 08/2011   right claw hallux    Patient's surgical history, family medical history, social history, medications and allergies were all reviewed in Epic    Physical Exam: BP 118/64   Pulse 97   Ht 6\' 5"  (1.956 m)   Wt (!) 368 lb 12.8 oz (167.3 kg)   SpO2 95%   BMI 43.73 kg/m    GENERAL:  Well developed white male in NAD PSYCH: :Pleasant, cooperative, normal affect EENT:  conjunctiva pink, mucous membranes moist, neck supple without masses CARDIAC:  Regular rate, irregular rhythm, no murmur heard, no peripheral edema PULM: Normal respiratory effort, lungs CTA bilaterally, no wheezing ABDOMEN:  Nondistended, soft, nontender. No obvious masses, no hepatomegaly,  normal bowel sounds SKIN:  turgor, no lesions seen Musculoskeletal:  Normal muscle tone, normal strength NEURO: Alert and oriented x 3, no focal neurologic deficits    ASSESSMENT and PLAN:  1. Pleasant 57 year old male recently hospitalized with Gi bleed. Endo capsule revealed bleeding in mid -distal small bowel. [facility] and repeat enteroscopy apparently unrevealing. Subsequently had [facility] which patient says was also negative but that there was some concern about study being incomplete. I am unable to see actual report in CareEverywhere.  No further bleeding.  -Will request procedure reports from [facility] to decide where to go from here. Once records received I will review them with Dr. Marina Goodell and get back with the patient. In the interim if he re-bleeds then best if he goes straight to [facility] ED if medically stable to make the drive, otherwise go to closest ED.  -CBC today.   2. New AFIB, diagnosed after transfer to [facility].  Now on anticoagulant which certainly increases his risk of bleeding. Due to see Cardiology soon   ADDENDUM 05/02/17: Double balloon enteroscopy done 04/05/2017, reports received.  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  Gastrojejunal anastomosis appeared health. Examined portion of efferent a dn afferent jejunum was normal. Examined portion of ileum appeared normal. The excluded stomach was not identified.   I reviewed studies with Dr. Marina Goodell then called patient. No new recommendations at this point. If rebleeds should go to [facility] (if medically stable to drive).   Willette Cluster , NP 04/28/2017, 11:08 AM

## 2017-04-28 NOTE — Patient Instructions (Signed)
If you are age 57 or older, your body mass index should be between 23-30. Your Body mass index is 43.73 kg/m. If this is out of the aforementioned range listed, please consider follow up with your Primary Care Provider.  If you are age 57 or younger, your body mass index should be between 19-25. Your Body mass index is 43.73 kg/m. If this is out of the aformentioned range listed, please consider follow up with your Primary Care Provider.   I will call you after we get report from [facility].  Thank you for choosing me and [facility].   Willette ClusterPaula Guenther, NP

## 2017-04-30 ENCOUNTER — Encounter: Payer: Self-pay | Admitting: Nurse Practitioner

## 2017-04-30 NOTE — Progress Notes (Signed)
Assessment and plans noted ?

## 2017-05-16 ENCOUNTER — Encounter: Payer: Self-pay | Admitting: Cardiology

## 2017-05-16 NOTE — Progress Notes (Signed)
HPI The patient presents for followup of bioprosthetic aortic valve replacement in 2005.  He had a follow up echo in Sept of last year.  This showed a NL bioprosthesis.  In December he was in the hospital with anemia and GI bleeding.  He questionable AVMs seen on capsule endoscopy.  He did have some volume overloaded and was treated with IV Lasix and metolazone.  [facility] "deep" enter[facility] but this could not be completed.  The stomach was not visualized as I read it.   He did have fib while [facility].   The patient has since been started on Xarelto.  Is not had any visible GI bleeding.  He is going to have his hemoglobin followed closely.  He does not feel his fibrillation.  He denies any palpitations, presyncope or syncope.  He does have some tiredness but wonders if it could be related to his anemia.  He denies any new shortness of breath, PND or orthopnea.  He has had no chest pressure, neck or arm discomfort.  Allergies  Allergen Reactions  . Nsaids Swelling and Other (See Comments)    NO REACTION LISTED LIPS AND NECK  . Penicillins Hives, Swelling and Other (See Comments)    NO REACTION LISTED Has patient had a PCN reaction causing immediate rash, facial/tongue/throat swelling, SOB or lightheadedness with hypotension: Yes Has patient had a PCN reaction causing severe rash involving mucus membranes or skin necrosis: No Has patient had a PCN reaction that required hospitalization No Has patient had a PCN reaction occurring within the last 10 years: No If all of the above answers are "NO", then may proceed with Cephalosporin use.  Has taken Keflex w/o issue      . Other Other (See Comments)    CYCLOOXYGENASE INHIBITORS-NO REACTION LISTED. CYCLOOXYGENASE INHIBITORS-NO REACTION LISTED.    Current Outpatient Medications  Medication Sig Dispense Refill  . Ascorbic Acid (VITAMIN C) 1000 MG tablet Take 1,000 mg by mouth daily.    . B Complex-C (SUPER B COMPLEX  PO) Take by mouth.    Marland Kitchen. CALCIUM-VITAMIN D PO Take 600 mg by mouth 2 (two) times daily.    . Cetirizine HCl (ZYRTEC ALLERGY PO) Take 10 mg by mouth daily. Reported on 09/05/2015    . esomeprazole (NEXIUM) 40 MG capsule Take 40 mg by mouth daily at 12 noon.    . Ferrous Sulfate (IRON) 325 (65 Fe) MG TABS Take by mouth.    . furosemide (LASIX) 40 MG tablet Take 1 tablet (40 mg total) by mouth daily. 30 tablet 6  . losartan (COZAAR) 25 MG tablet Take 1 tablet by mouth daily.    . Melatonin 3-10 MG TABS Take by mouth.    . metolazone (ZAROXOLYN) 5 MG tablet Take 5 mg by mouth once a week. Saturdays    . potassium chloride (KLOR-CON) 8 MEQ tablet Take 1 tablet by mouth daily.    . rivaroxaban (XARELTO) 20 MG TABS tablet Take 1 tablet (20 mg total) by mouth daily with supper. 30 tablet 11  . sertraline (ZOLOFT) 100 MG tablet Take 100 mg by mouth daily.    . simvastatin (ZOCOR) 10 MG tablet Take 1 tablet by mouth daily.    . sitaGLIPtin-metformin (JANUMET) 50-1000 MG tablet Take 1 tablet by mouth daily.    . traZODone (DESYREL) 50 MG tablet Take 1 tablet by mouth at bedtime.     No current facility-administered medications for this visit.  Past Medical History:  Diagnosis Date  . Arthritis   . Dental crowns present   . Diabetes mellitus    NIDDM  . Diabetic foot ulcer (HCC) 08/2011   right foot  . Dysrhythmia   . Fatty liver   . Gastric ulcer    no current problems  . GERD (gastroesophageal reflux disease)   . Gout   . 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 History of bronchitis as a child   . 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 Hx of bacterial endocarditis 11/2003  . Hypertension    under control; states med. is really to protect kidneys due to diabetes  . Neuropathy    feet bilat   . Obesity   . Pneumonia    history of as child   . Sleep apnea    uses CPAP nightly  . 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 Toe deformity  08/2011   right claw hallux    Past Surgical History:  Procedure Laterality Date  . adenoid surgery     . AORTIC VALVE REPLACEMENT  11/2003  . CARDIAC CATHETERIZATION    . COLONOSCOPY WITH PROPOFOL N/A 03/30/2017   Procedure: COLONOSCOPY WITH PROPOFOL;  Surgeon: Benancio Deeds, MD;  Location: [facility];  Service: Gastroenterology;  Laterality: N/A;  . ESOPHAGOGASTRODUODENOSCOPY (EGD) WITH PROPOFOL N/A 03/29/2017   Procedure: ESOPHAGOGASTRODUODENOSCOPY (EGD) WITH PROPOFOL;  Surgeon: Benancio Deeds, MD;  Location: [facility];  Service: Gastroenterology;  Laterality: N/A;  . GASTRIC BYPASS  03/2004  . GIVENS CAPSULE STUDY N/A 03/30/2017   Procedure: GIVENS CAPSULE STUDY;  Surgeon: Benancio Deeds, MD;  Location: [facility];  Service: Gastroenterology;  Laterality: N/A;  . KNEE ARTHROSCOPY  08/09/2007 - left   07/15/2003 - right  . METATARSAL OSTEOTOMY  03/20/2010   right 1st MT; gastroc soleus recession  . NASAL SINUS SURGERY    . TENDON RELEASE  09/09/2011   Procedure: HEEL CORD LENGTHENING;  Surgeon: Toni Arthurs, MD;  Location: [facility];  Service: Orthopedics;  Laterality: Right;  Righ tachilles tendon lengthening  . TOE FUSION  07/12/2005   left 3rd toe PIP and DIP fusion  . TONSILLECTOMY AND ADENOIDECTOMY    . TOTAL KNEE ARTHROPLASTY Left 03/18/2015   Procedure: LEFT TOTAL KNEE ARTHROPLASTY;  Surgeon: Durene Romans, MD;  Location: [facility];  Service: Orthopedics;  Laterality: Left;    ROS:  As stated in the HPI and negative for all other systems.  PHYSICAL EXAM BP 119/80   Pulse (!) 107   Ht 6\' 5"  (1.956 m)   Wt (!) 355 lb 6.4 oz (161.2 kg)   BMI 42.14 kg/m   GENERAL:  Well appearing NECK:  No jugular venous distention, waveform within normal limits, carotid upstroke brisk and symmetric, no bruits, no thyromegaly LUNGS:  Clear to auscultation bilaterally CHEST:  Well healed sternotomy scar. HEART:  PMI not displaced or sustained,S1 and S2  within normal limits,  no S4, no clicks, no rubs, no murmurs, irregular ABD:  Flat, positive bowel sounds normal in frequency in pitch, no bruits, no rebound, no guarding, no midline pulsatile mass, no hepatomegaly, no splenomegaly EXT:  2 plus pulses throughout, no edema, no cyanosis no clubbing   EKG:  NA  ASSESSMENT AND PLAN  AVR - This demonstrates normal function on echo in Sept 2019.  He understand SBE prophylaxis.  He has had previous endocarditis.  I will  follow up with an echo later this year.    ATRIAL FIB -  He was anemic but stable earlier this month.  I requested that he have this followed by [name], MD.   We had a long discussion about atrial fibrillation and the risk benefits of anticoagulation.  He might have a Philip [name] has a CHA2DS2 - VASc score of 2 is he has hypertension listed and he is a diabetic.  Also has had some valvular disease which I think would increase his risk though not in the score and I will not call this valvular A. fib since it was not stenosis.  I think he should remain on anticoagulation and I think Xarelto is an excellent choice although he understands that if there is any further GI bleeding or anemia or requirement for frequent transfusions he would come off of this.  I will see him back in about a month and if he is tolerating anticoagulation and there is no consideration that he might need to come off of it I would probably plan elective cardioversion.  I will check a 48-hour Holter to make sure he is in persistent atrial fibrillation and that he has good rate control.  HTN - The blood pressure is  at target.  No change in therapy.    OBESITY - He continues to work on this.   I extensively reviewed [facility] and GI records for this visit.

## 2017-05-18 ENCOUNTER — Ambulatory Visit (INDEPENDENT_AMBULATORY_CARE_PROVIDER_SITE_OTHER): Payer: BLUE CROSS/BLUE SHIELD | Admitting: Cardiology

## 2017-05-18 ENCOUNTER — Encounter: Payer: Self-pay | Admitting: Cardiology

## 2017-05-18 VITALS — BP 119/80 | HR 107 | Ht 77.0 in | Wt 355.4 lb

## 2017-05-18 DIAGNOSIS — Z952 Presence of prosthetic heart valve: Secondary | ICD-10-CM | POA: Diagnosis not present

## 2017-05-18 DIAGNOSIS — I4891 Unspecified atrial fibrillation: Secondary | ICD-10-CM

## 2017-05-18 DIAGNOSIS — Z79899 Other long term (current) drug therapy: Secondary | ICD-10-CM | POA: Diagnosis not present

## 2017-05-18 LAB — BASIC METABOLIC PANEL WITH GFR
BUN/Creatinine Ratio: 14 (ref 9–20)
BUN: 16 mg/dL (ref 6–24)
CO2: 24 mmol/L (ref 20–29)
Calcium: 9.2 mg/dL (ref 8.7–10.2)
Chloride: 98 mmol/L (ref 96–106)
Creatinine, Ser: 1.12 mg/dL (ref 0.76–1.27)
GFR calc Af Amer: 84 mL/min/1.73
GFR calc non Af Amer: 73 mL/min/1.73
Glucose: 119 mg/dL — ABNORMAL HIGH (ref 65–99)
Potassium: 3.9 mmol/L (ref 3.5–5.2)
Sodium: 141 mmol/L (ref 134–144)

## 2017-05-18 NOTE — Patient Instructions (Signed)
Medication Instructions:  Continue current medications  If you need a refill on your cardiac medications before your next appointment, please call your pharmacy.  Labwork: BMP today HERE IN OUR OFFICE AT [facility]  Take the provided lab slips for you to take with you to the lab for you blood draw.   You will NOT need to fast   You may go to any [facility] lab that is convenient for you however, we do have a lab in our office that is able to assist you. You do NOT need an appointment for our lab. Once in our office lobby there is a podium to the right of the check-in desk where you are to sign-in and ring a doorbell to alert us you are here. Lab is open Monday-Friday from 8:00am to 4:00pm; and is closed for lunch from 12:45p-1:45pm   Testing/Procedures: Your physician has recommended that you wear a 24 hour holter monitor. Holter monitors are medical devices that record the heart's electrical activity. Doctors most often use these monitors to diagnose arrhythmias. Arrhythmias are problems with the speed or rhythm of the heartbeat. The monitor is a small, portable device. You can wear one while you do your normal daily activities. This is usually used to diagnose what is causing palpitations/syncope (passing out).   Follow-Up: Your physician wants you to follow-up in: 1 Month.    Thank you for choosing [facility]!!

## 2017-05-23 ENCOUNTER — Encounter: Payer: Self-pay | Admitting: Cardiology

## 2017-05-23 MED ORDER — METOLAZONE 5 MG PO TABS: 5.0000 mg | ORAL_TABLET | ORAL | 3 refills | Status: DC

## 2017-05-23 MED ORDER — RIVAROXABAN 20 MG PO TABS: 20.0000 mg | ORAL_TABLET | Freq: Every day | ORAL | 1 refills | Status: DC

## 2017-05-24 ENCOUNTER — Ambulatory Visit (INDEPENDENT_AMBULATORY_CARE_PROVIDER_SITE_OTHER): Payer: BLUE CROSS/BLUE SHIELD

## 2017-05-24 DIAGNOSIS — I4891 Unspecified atrial fibrillation: Secondary | ICD-10-CM | POA: Diagnosis not present

## 2017-05-31 ENCOUNTER — Ambulatory Visit: Payer: Managed Care, Other (non HMO) | Admitting: Cardiology

## 2017-06-01 ENCOUNTER — Telehealth: Payer: Self-pay | Admitting: *Deleted

## 2017-06-01 MED ORDER — METOPROLOL SUCCINATE ER 50 MG PO TB24: 50.0000 mg | ORAL_TABLET | Freq: Every day | ORAL | 0 refills | Status: DC

## 2017-06-01 MED ORDER — METOPROLOL SUCCINATE ER 50 MG PO TB24: 50.0000 mg | ORAL_TABLET | Freq: Every day | ORAL | 3 refills | Status: DC

## 2017-06-01 NOTE — Telephone Encounter (Signed)
-----   Message from Rollene RotundaJames Hochrein, MD sent at 05/28/2017  8:32 PM EST ----- Call with results.  Atrial fib rate is slightly high and I would like to add Toprol XL 50 mg po daily disp number 31 with 11 refills.  Call Mr. Coia with the results and send results to Angelica ChessmanAguiar, Rafaela M, MD

## 2017-06-01 NOTE — Telephone Encounter (Signed)
Pt aware of his holter monitor  Rx has been sent to the pharmacy electronically. Metoprolol succ 50 mg was send into pt pharmacy

## 2017-06-27 ENCOUNTER — Telehealth: Payer: Self-pay | Admitting: Cardiology

## 2017-06-27 NOTE — Telephone Encounter (Signed)
Closed Encounter  °

## 2017-07-04 ENCOUNTER — Ambulatory Visit: Payer: Managed Care, Other (non HMO) | Admitting: Cardiology

## 2017-07-06 ENCOUNTER — Encounter: Payer: Self-pay | Admitting: Cardiology

## 2017-07-13 NOTE — Progress Notes (Signed)
HPI The patient presents for followup of bioprosthetic aortic valve replacement in 2005.  He had a follow up echo in Sept of last year.  This showed a NL bioprosthesis.  In December he was in the hospital with anemia and GI bleeding.  He had questionable AVMs seen on capsule endoscopy.  He did have some volume overload and was treated with IV Lasix and metolazone.  He was transferred to [facility] for a "deep" enteroscopy but this could not be completed.  The stomach was not visualized as I read it.   He did have atrial  fib while at [facility].  He eventually was restarted on Xarelto .   After the last visit I sent him for a Holter and he had increased rate.  I started beta blocker.    He comes today and says he has been having increasing lightheadedness and dizziness.  It seems to come in waves.  It does not happen all the time.  He has not had any frank syncope.  His sometimes gets short of breath walking a short distance on level ground but is not describing PND or orthopnea.  He has increased fatigue.  He does not really notice his palpitations.  He is not having any chest pressure.    Allergies  Allergen Reactions  . Nsaids Swelling and Other (See Comments)    NO REACTION LISTED LIPS AND NECK  . Penicillins Hives, Swelling and Other (See Comments)    NO REACTION LISTED Has patient had a PCN reaction causing immediate rash, facial/tongue/throat swelling, SOB or lightheadedness with hypotension: Yes Has patient had a PCN reaction causing severe rash involving mucus membranes or skin necrosis: No Has patient had a PCN reaction that required hospitalization No Has patient had a PCN reaction occurring within the last 10 years: No If all of the above answers are "NO", then may proceed with Cephalosporin use.  Has taken Keflex w/o issue      . Other Other (See Comments)    CYCLOOXYGENASE INHIBITORS-NO REACTION LISTED. CYCLOOXYGENASE INHIBITORS-NO REACTION LISTED.    Current Outpatient  Medications  Medication Sig Dispense Refill  . Ascorbic Acid (VITAMIN C) 1000 MG tablet Take 1,000 mg by mouth daily.    . B Complex-C (SUPER B COMPLEX PO) Take by mouth.    Marland Kitchen CALCIUM-VITAMIN D PO Take 600 mg by mouth 2 (two) times daily.    . Cetirizine HCl (ZYRTEC ALLERGY PO) Take 10 mg by mouth daily. Reported on 09/05/2015    . esomeprazole (NEXIUM) 40 MG capsule Take 40 mg by mouth daily at 12 noon.    . Ferrous Sulfate (IRON) 325 (65 Fe) MG TABS Take by mouth.    . furosemide (LASIX) 40 MG tablet Take 1 tablet (40 mg total) by mouth daily. 30 tablet 6  . losartan (COZAAR) 25 MG tablet Take 1 tablet by mouth daily.    . Melatonin 3-10 MG TABS Take by mouth.    . metolazone (ZAROXOLYN) 5 MG tablet Take 1 tablet (5 mg total) by mouth once a week. Saturdays 12 tablet 3  . metoprolol succinate (TOPROL-XL) 50 MG 24 hr tablet Take 1 tablet (50 mg total) by mouth daily. Take with or immediately following a meal. 90 tablet 3  . potassium chloride (KLOR-CON) 8 MEQ tablet Take 1 tablet by mouth daily.    . rivaroxaban (XARELTO) 20 MG TABS tablet Take 1 tablet (20 mg total) by mouth daily with supper. 90 tablet 1  .  sertraline (ZOLOFT) 100 MG tablet Take 100 mg by mouth daily.    . simvastatin (ZOCOR) 10 MG tablet Take 1 tablet by mouth daily.    . sitaGLIPtin-metformin (JANUMET) 50-1000 MG tablet Take 1 tablet by mouth daily.    . traZODone (DESYREL) 50 MG tablet Take 1 tablet by mouth at bedtime.     No current facility-administered medications for this visit.     Past Medical History:  Diagnosis Date  . Arthritis   . Dental crowns present   . Diabetes mellitus    NIDDM  . Diabetic foot ulcer ([place]) 08/2011   right foot  . Dysrhythmia   . Fatty liver   . Gastric ulcer    no current problems  . GERD (gastroesophageal reflux disease)   . Gout   . 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 History of bronchitis as a child   . 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 Hx of bacterial endocarditis 11/2003  . Hypertension    under control; states med. is really to protect kidneys due to diabetes  . Neuropathy    feet bilat   . Obesity   . Pneumonia    history of as child   . Sleep apnea    uses CPAP nightly  . 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 adenoid surgery     . AORTIC VALVE REPLACEMENT  11/2003  . CARDIAC CATHETERIZATION    . COLONOSCOPY WITH PROPOFOL N/A 03/30/2017   Procedure: COLONOSCOPY WITH PROPOFOL;  Surgeon: Yetta Flock, MD;  Location: [facility];  Service: Gastroenterology;  Laterality: N/A;  . ESOPHAGOGASTRODUODENOSCOPY (EGD) WITH PROPOFOL N/A 03/29/2017   Procedure: ESOPHAGOGASTRODUODENOSCOPY (EGD) WITH PROPOFOL;  Surgeon: Yetta Flock, MD;  [facility];  Service: Gastroenterology;  Laterality: N/A;  . GASTRIC BYPASS  03/2004  . GIVENS CAPSULE STUDY N/A 03/30/2017   Procedure: GIVENS CAPSULE STUDY;  Surgeon: Yetta Flock, MD;  [facility];  Service: Gastroenterology;  Laterality: N/A;  . KNEE ARTHROSCOPY  08/09/2007 - left   07/15/2003 - right  . METATARSAL OSTEOTOMY  03/20/2010   right 1st MT; gastroc soleus recession  . NASAL SINUS SURGERY    . TENDON RELEASE  09/09/2011   Procedure: HEEL CORD LENGTHENING;  Surgeon: Wylene Simmer, MD;  Location: [place];  Service: Orthopedics;  Laterality: Right;  Righ tachilles tendon lengthening  . TOE FUSION  07/12/2005   left 3rd toe PIP and DIP fusion  . TONSILLECTOMY AND ADENOIDECTOMY    . TOTAL KNEE ARTHROPLASTY Left 03/18/2015   Procedure: LEFT TOTAL KNEE ARTHROPLASTY;  Surgeon: Paralee Cancel, MD;  Location[facility];  Service: Orthopedics;  Laterality: Left;    ROS:  As stated in the HPI and negative for all other systems.  PHYSICAL EXAM BP 108/88   Pulse 78   Ht _0  (1.956 m)   Wt (!) 350 lb  (158.8 kg)   SpO2 99%   BMI 41.50 kg/m   GENERAL:  Well appearing NECK:  No jugular venous distention, waveform within normal limits, carotid upstroke brisk and symmetric, no bruits, no thyromegaly LUNGS:  Clear to auscultation bilaterally CHEST:  Well healed sternotomy scar. HEART:  PMI not displaced or sustained,S1 and S2 within normal limits, no S3, no clicks, no rubs, no murmurs ,  irregular ABD:  Flat, positive bowel sounds normal in frequency in pitch, no bruits, no rebound, no guarding, no midline pulsatile mass, no hepatomegaly, no splenomegaly EXT:  2 plus pulses throughout, no edema, no cyanosis no clubbing, chronic venous stasis changes, mild edema.   EKG:  NA  ASSESSMENT AND PLAN  AVR - He had normal valve function on echo in September 2019.  He understands endocrines prophylaxis.  He had previous endocarditis.  No change in therapy is indicated.    MR - He had stable repair on echo in Sept.   There is mild MR.    ATRIAL FIB -   Mr. OFFIE WAIDE has a CHA2DS2 - VASc score of 2.  He had an increased rate recently.  He has now been able to tolerate anticoagulation.  He is having some fatigue and other symptoms as above that could be related to fibrillation some you plan to have a cardioversion.  For now will remain on the meds as listed.  HTN - The blood pressure is controlled.  He will continue the meds as listed.   OBESITY - We talked about weight loss strategies.   FATIGUE -  I will order a be met, CBC and TSH.

## 2017-07-13 NOTE — H&P (View-Only) (Signed)
HPI The patient presents for followup of bioprosthetic aortic valve replacement in 2005.  He had a follow up echo in Sept of last year.  This showed a NL bioprosthesis.  In December he was in the hospital with anemia and GI bleeding.  He had questionable AVMs seen on capsule endoscopy.  He did have some volume overload and was treated with IV Lasix and metolazone.  He was transferred to [facility] for a "deep" enteroscopy but this could not be completed.  The stomach was not visualized as I read it.   He did have atrial  fib while at [facility].  He eventually was restarted on Xarelto .   After the last visit I sent him for a Holter and he had increased rate.  I started beta blocker.    He comes today and says he has been having increasing lightheadedness and dizziness.  It seems to come in waves.  It does not happen all the time.  He has not had any frank syncope.  His sometimes gets short of breath walking a short distance on level ground but is not describing PND or orthopnea.  He has increased fatigue.  He does not really notice his palpitations.  He is not having any chest pressure.    Allergies  Allergen Reactions  . Nsaids Swelling and Other (See Comments)    NO REACTION LISTED LIPS AND NECK  . Penicillins Hives, Swelling and Other (See Comments)    NO REACTION LISTED Has patient had a PCN reaction causing immediate rash, facial/tongue/throat swelling, SOB or lightheadedness with hypotension: Yes Has patient had a PCN reaction causing severe rash involving mucus membranes or skin necrosis: No Has patient had a PCN reaction that required hospitalization No Has patient had a PCN reaction occurring within the last 10 years: No If all of the above answers are "NO", then may proceed with Cephalosporin use.  Has taken Keflex w/o issue      . Other Other (See Comments)    CYCLOOXYGENASE INHIBITORS-NO REACTION LISTED. CYCLOOXYGENASE INHIBITORS-NO REACTION LISTED.    Current Outpatient  Medications  Medication Sig Dispense Refill  . Ascorbic Acid (VITAMIN C) 1000 MG tablet Take 1,000 mg by mouth daily.    . B Complex-C (SUPER B COMPLEX PO) Take by mouth.    Marland Kitchen CALCIUM-VITAMIN D PO Take 600 mg by mouth 2 (two) times daily.    . Cetirizine HCl (ZYRTEC ALLERGY PO) Take 10 mg by mouth daily. Reported on 09/05/2015    . esomeprazole (NEXIUM) 40 MG capsule Take 40 mg by mouth daily at 12 noon.    . Ferrous Sulfate (IRON) 325 (65 Fe) MG TABS Take by mouth.    . furosemide (LASIX) 40 MG tablet Take 1 tablet (40 mg total) by mouth daily. 30 tablet 6  . losartan (COZAAR) 25 MG tablet Take 1 tablet by mouth daily.    . Melatonin 3-10 MG TABS Take by mouth.    . metolazone (ZAROXOLYN) 5 MG tablet Take 1 tablet (5 mg total) by mouth once a week. Saturdays 12 tablet 3  . metoprolol succinate (TOPROL-XL) 50 MG 24 hr tablet Take 1 tablet (50 mg total) by mouth daily. Take with or immediately following a meal. 90 tablet 3  . potassium chloride (KLOR-CON) 8 MEQ tablet Take 1 tablet by mouth daily.    . rivaroxaban (XARELTO) 20 MG TABS tablet Take 1 tablet (20 mg total) by mouth daily with supper. 90 tablet 1  .  sertraline (ZOLOFT) 100 MG tablet Take 100 mg by mouth daily.    . simvastatin (ZOCOR) 10 MG tablet Take 1 tablet by mouth daily.    . sitaGLIPtin-metformin (JANUMET) 50-1000 MG tablet Take 1 tablet by mouth daily.    . traZODone (DESYREL) 50 MG tablet Take 1 tablet by mouth at bedtime.     No current facility-administered medications for this visit.     Past Medical History:  Diagnosis Date  . Arthritis   . Dental crowns present   . Diabetes mellitus    NIDDM  . Diabetic foot ulcer (HCC) 08/2011   right foot  . Dysrhythmia   . Fatty liver   . Gastric ulcer    no current problems  . GERD (gastroesophageal reflux disease)   . Gout   . 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 History of bronchitis as a child   . 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 Hx of bacterial endocarditis 11/2003  . Hypertension    under control; states med. is really to protect kidneys due to diabetes  . Neuropathy    feet bilat   . Obesity   . Pneumonia    history of as child   . Sleep apnea    uses CPAP nightly  . 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 adenoid surgery     . AORTIC VALVE REPLACEMENT  11/2003  . CARDIAC CATHETERIZATION    . COLONOSCOPY WITH PROPOFOL N/A 03/30/2017   Procedure: COLONOSCOPY WITH PROPOFOL;  Surgeon: Armbruster, Steven P, MD;  Location: [facility];  Service: Gastroenterology;  Laterality: N/A;  . ESOPHAGOGASTRODUODENOSCOPY (EGD) WITH PROPOFOL N/A 03/29/2017   Procedure: ESOPHAGOGASTRODUODENOSCOPY (EGD) WITH PROPOFOL;  Surgeon: Armbruster, Steven P, MD;  Location: [facility];  Service: Gastroenterology;  Laterality: N/A;  . GASTRIC BYPASS  03/2004  . GIVENS CAPSULE STUDY N/A 03/30/2017   Procedure: GIVENS CAPSULE STUDY;  Surgeon: Armbruster, Steven P, MD;  Location: [facility];  Service: Gastroenterology;  Laterality: N/A;  . KNEE ARTHROSCOPY  08/09/2007 - left   07/15/2003 - right  . METATARSAL OSTEOTOMY  03/20/2010   right 1st MT; gastroc soleus recession  . NASAL SINUS SURGERY    . TENDON RELEASE  09/09/2011   Procedure: HEEL CORD LENGTHENING;  Surgeon: John Hewitt, MD;  Location: [facility];  Service: Orthopedics;  Laterality: Right;  Righ tachilles tendon lengthening  . TOE FUSION  07/12/2005   left 3rd toe PIP and DIP fusion  . TONSILLECTOMY AND ADENOIDECTOMY    . TOTAL KNEE ARTHROPLASTY Left 03/18/2015   Procedure: LEFT TOTAL KNEE ARTHROPLASTY;  Surgeon: Matthew Olin, MD;  Location: [facility];  Service: Orthopedics;  Laterality: Left;    ROS:  As stated in the HPI and negative for all other systems.  PHYSICAL EXAM BP 108/88   Pulse 78   Ht 6' 5" (1.956 m)   Wt (!) 350 lb  (158.8 kg)   SpO2 99%   BMI 41.50 kg/m   GENERAL:  Well appearing NECK:  No jugular venous distention, waveform within normal limits, carotid upstroke brisk and symmetric, no bruits, no thyromegaly LUNGS:  Clear to auscultation bilaterally CHEST:  Well healed sternotomy scar. HEART:  PMI not displaced or sustained,S1 and S2 within normal limits, no S3, no clicks, no rubs, no murmurs ,   irregular ABD:  Flat, positive bowel sounds normal in frequency in pitch, no bruits, no rebound, no guarding, no midline pulsatile mass, no hepatomegaly, no splenomegaly EXT:  2 plus pulses throughout, no edema, no cyanosis no clubbing, chronic venous stasis changes, mild edema.   EKG:  NA  ASSESSMENT AND PLAN  AVR - He had normal valve function on echo in September 2019.  He understands endocrines prophylaxis.  He had previous endocarditis.  No change in therapy is indicated.    MR - He had stable repair on echo in Sept.   There is mild MR.    ATRIAL FIB -   Philip [name] has a CHA2DS2 - VASc score of 2.  He had an increased rate recently.  He has now been able to tolerate anticoagulation.  He is having some fatigue and other symptoms as above that could be related to fibrillation some you plan to have a cardioversion.  For now will remain on the meds as listed.  HTN - The blood pressure is controlled.  He will continue the meds as listed.   OBESITY - We talked about weight loss strategies.   FATIGUE -  I will order a be met, CBC and TSH.

## 2017-07-14 ENCOUNTER — Encounter: Payer: Self-pay | Admitting: Cardiology

## 2017-07-14 ENCOUNTER — Ambulatory Visit (INDEPENDENT_AMBULATORY_CARE_PROVIDER_SITE_OTHER): Payer: BLUE CROSS/BLUE SHIELD | Admitting: Cardiology

## 2017-07-14 VITALS — BP 108/88 | HR 78 | Ht 77.0 in | Wt 350.0 lb

## 2017-07-14 DIAGNOSIS — R5383 Other fatigue: Secondary | ICD-10-CM | POA: Diagnosis not present

## 2017-07-14 DIAGNOSIS — D689 Coagulation defect, unspecified: Secondary | ICD-10-CM | POA: Diagnosis not present

## 2017-07-14 DIAGNOSIS — I10 Essential (primary) hypertension: Secondary | ICD-10-CM

## 2017-07-14 DIAGNOSIS — Z952 Presence of prosthetic heart valve: Secondary | ICD-10-CM

## 2017-07-14 DIAGNOSIS — I4891 Unspecified atrial fibrillation: Secondary | ICD-10-CM

## 2017-07-14 DIAGNOSIS — Z01812 Encounter for preprocedural laboratory examination: Secondary | ICD-10-CM | POA: Diagnosis not present

## 2017-07-14 NOTE — Patient Instructions (Signed)
Medication Instructions:  Continue current medications  If you need a refill on your cardiac medications before your next appointment, please call your pharmacy.  Labwork: Pre Op Labs Today HERE IN OUR OFFICE AT [facility]  Take the provided lab slips for you to take with you to the lab for you blood draw.    You will NOT need to fast   Testing/Procedures: Your physician has recommended that you have a Cardioversion (DCCV). Electrical Cardioversion uses a jolt of electricity to your heart either through paddles or wired patches attached to your chest. This is a controlled, usually prescheduled, procedure. Defibrillation is done under light anesthesia in the hospital, and you usually go home the day of the procedure. This is done to get your heart back into a normal rhythm. You are not awake for the procedure. Please see the instruction sheet given to you today.  Special Instructions: Dear Philip OraAlan Bloodsaw  You are scheduled for a Cardioversion on            with Dr.                     .  [facility] at [facility]: [address] [place], [place] [address] at           am/pm. (1 hour prior to procedure unless lab work is needed; if lab work is needed arrive 1.5 hours ahead)  DIET: Nothing to eat or drink after midnight except a sip of water with medications (see medication instructions below)  Medication Instructions: Continue your anticoagulant: Xarelto You will need to continue your anticoagulant after your procedure until you are told by your  Provider that it is safe to stop   Labs: If patient is on Coumadin, patient needs pt/INR, CBC, BMET within 3 days (No pt/INR needed for patients taking Xarelto, Eliquis, Pradaxa) For patients receiving anesthesia for TEE and all Cardioversion patients: BMET, CBC within 1 week  You must have a responsible person to drive you home and stay in the waiting area during your procedure. Failure to do so could  result in cancellation.  Bring your insurance cards.  *Special Note: Every effort is made to have your procedure done on time. Occasionally there are emergencies that occur at the hospital that may cause delays. Please be patient if a delay does occur.    Follow-Up: Your physician wants you to follow-up in: After Cardioversion.     Thank you for choosing [facility]!!

## 2017-07-15 LAB — APTT: aPTT: 31 s (ref 24–33)

## 2017-07-15 LAB — PROTIME-INR
INR: 1.3 — ABNORMAL HIGH (ref 0.8–1.2)
PROTHROMBIN TIME: 13.5 s — AB (ref 9.1–12.0)

## 2017-07-15 LAB — CBC
Hematocrit: 35.6 % — ABNORMAL LOW (ref 37.5–51.0)
Hemoglobin: 10.9 g/dL — ABNORMAL LOW (ref 13.0–17.7)
MCH: 25.2 pg — AB (ref 26.6–33.0)
MCHC: 30.6 g/dL — ABNORMAL LOW (ref 31.5–35.7)
MCV: 82 fL (ref 79–97)
PLATELETS: 229 10*3/uL (ref 150–379)
RBC: 4.32 x10E6/uL (ref 4.14–5.80)
RDW: 17.5 % — AB (ref 12.3–15.4)
WBC: 5.6 10*3/uL (ref 3.4–10.8)

## 2017-07-15 LAB — BASIC METABOLIC PANEL
BUN/Creatinine Ratio: 12 (ref 9–20)
BUN: 14 mg/dL (ref 6–24)
CHLORIDE: 100 mmol/L (ref 96–106)
CO2: 27 mmol/L (ref 20–29)
Calcium: 9.5 mg/dL (ref 8.7–10.2)
Creatinine, Ser: 1.16 mg/dL (ref 0.76–1.27)
GFR calc non Af Amer: 70 mL/min/{1.73_m2} (ref >59)
GFR, EST AFRICAN AMERICAN: 81 mL/min/{1.73_m2} (ref >59)
GLUCOSE: 105 mg/dL — AB (ref 65–99)
POTASSIUM: 3.9 mmol/L (ref 3.5–5.2)
Sodium: 142 mmol/L (ref 134–144)

## 2017-07-15 LAB — TSH: TSH: 4.05 u[IU]/mL (ref 0.450–4.500)

## 2017-07-24 ENCOUNTER — Other Ambulatory Visit: Payer: Self-pay | Admitting: Cardiology

## 2017-07-24 NOTE — Anesthesia Preprocedure Evaluation (Addendum)
Anesthesia Evaluation  Patient identified by MRN, date of birth, ID band Patient awake    Reviewed: Allergy & Precautions, NPO status , Patient's Chart, lab work & pertinent test results  Airway Mallampati: II  TM Distance: >3 FB Neck ROM: Full    Dental no notable dental hx.    Pulmonary sleep apnea , former smoker,    Pulmonary exam normal breath sounds clear to auscultation       Cardiovascular Exercise Tolerance: Good hypertension, Pt. on home beta blockers and Pt. on medications Normal cardiovascular exam+ dysrhythmias Atrial Fibrillation  Rhythm:Regular Rate:Normal     Neuro/Psych negative neurological ROS  negative psych ROS   GI/Hepatic Neg liver ROS, GERD  ,  Endo/Other  diabetes, Type obesity  Renal/GU negative Renal ROS     Musculoskeletal negative musculoskeletal ROS (+)   Abdominal (+) + obese,   Peds  Hematology negative hematology ROS (+) anemia ,   Anesthesia Other Findings   Reproductive/Obstetrics                            Anesthesia Physical Anesthesia Plan  ASA: III  Anesthesia Plan: General   Post-op Pain Management:    Induction: Intravenous  PONV Risk Score and Plan:   Airway Management Planned: Mask  Additional Equipment:   Intra-op Plan:   Post-operative Plan:   Informed Consent: I have reviewed the patients History and Physical, chart, labs and discussed the procedure including the risks, benefits and alternatives for the proposed anesthesia with the patient or authorized representative who has indicated his/her understanding and acceptance.     Plan Discussed with: CRNA  Anesthesia Plan Comments:         Anesthesia Quick Evaluation

## 2017-07-25 ENCOUNTER — Other Ambulatory Visit: Payer: Self-pay

## 2017-07-25 ENCOUNTER — Ambulatory Visit (HOSPITAL_COMMUNITY): Payer: BLUE CROSS/BLUE SHIELD | Admitting: Anesthesiology

## 2017-07-25 ENCOUNTER — Encounter (HOSPITAL_COMMUNITY)
Admission: RE | Disposition: A | Discharge status: Home / Self Care | Payer: Self-pay | Source: Ambulatory Visit | Attending: Cardiovascular Disease

## 2017-07-25 ENCOUNTER — Ambulatory Visit (HOSPITAL_COMMUNITY)
Admission: RE | Admit: 2017-07-25 | Discharge: 2017-07-25 | Disposition: A | Discharge status: Home / Self Care | Payer: BLUE CROSS/BLUE SHIELD | Source: Ambulatory Visit | Attending: Cardiovascular Disease | Admitting: Cardiovascular Disease

## 2017-07-25 ENCOUNTER — Encounter (HOSPITAL_COMMUNITY): Payer: Self-pay | Admitting: Certified Registered Nurse Anesthetist

## 2017-07-25 DIAGNOSIS — Z880 Allergy status to penicillin: Secondary | ICD-10-CM | POA: Diagnosis not present

## 2017-07-25 DIAGNOSIS — D649 Anemia, unspecified: Secondary | ICD-10-CM | POA: Insufficient documentation

## 2017-07-25 DIAGNOSIS — E1142 Type 2 diabetes mellitus with diabetic polyneuropathy: Secondary | ICD-10-CM | POA: Diagnosis not present

## 2017-07-25 DIAGNOSIS — I482 Chronic atrial fibrillation: Secondary | ICD-10-CM | POA: Diagnosis not present

## 2017-07-25 DIAGNOSIS — Z981 Arthrodesis status: Secondary | ICD-10-CM | POA: Insufficient documentation

## 2017-07-25 DIAGNOSIS — Z96652 Presence of left artificial knee joint: Secondary | ICD-10-CM | POA: Diagnosis not present

## 2017-07-25 DIAGNOSIS — I4891 Unspecified atrial fibrillation: Secondary | ICD-10-CM | POA: Insufficient documentation

## 2017-07-25 DIAGNOSIS — Z79899 Other long term (current) drug therapy: Secondary | ICD-10-CM | POA: Diagnosis not present

## 2017-07-25 DIAGNOSIS — K219 Gastro-esophageal reflux disease without esophagitis: Secondary | ICD-10-CM | POA: Diagnosis not present

## 2017-07-25 DIAGNOSIS — M109 Gout, unspecified: Secondary | ICD-10-CM | POA: Insufficient documentation

## 2017-07-25 DIAGNOSIS — Z6841 Body Mass Index (BMI) 40.0 and over, adult: Secondary | ICD-10-CM | POA: Diagnosis not present

## 2017-07-25 DIAGNOSIS — Z87891 Personal history of nicotine dependence: Secondary | ICD-10-CM | POA: Insufficient documentation

## 2017-07-25 DIAGNOSIS — M1990 Unspecified osteoarthritis, unspecified site: Secondary | ICD-10-CM | POA: Insufficient documentation

## 2017-07-25 DIAGNOSIS — I10 Essential (primary) hypertension: Secondary | ICD-10-CM | POA: Diagnosis not present

## 2017-07-25 DIAGNOSIS — G4730 Sleep apnea, unspecified: Secondary | ICD-10-CM | POA: Diagnosis not present

## 2017-07-25 DIAGNOSIS — Z954 Presence of other heart-valve replacement: Secondary | ICD-10-CM | POA: Insufficient documentation

## 2017-07-25 DIAGNOSIS — Z888 Allergy status to other drugs, medicaments and biological substances status: Secondary | ICD-10-CM | POA: Diagnosis not present

## 2017-07-25 DIAGNOSIS — Z9884 Bariatric surgery status: Secondary | ICD-10-CM | POA: Diagnosis not present

## 2017-07-25 HISTORY — PX: CARDIOVERSION: SHX1299

## 2017-07-25 LAB — POCT I-STAT, CHEM 8
BUN: 22 mg/dL — ABNORMAL HIGH (ref 6–20)
CHLORIDE: 103 mmol/L (ref 101–111)
CREATININE: 1.2 mg/dL (ref 0.61–1.24)
Calcium, Ion: 1.15 mmol/L (ref 1.15–1.40)
Glucose, Bld: 120 mg/dL — ABNORMAL HIGH (ref 65–99)
HEMATOCRIT: 34 % — AB (ref 39.0–52.0)
Hemoglobin: 11.6 g/dL — ABNORMAL LOW (ref 13.0–17.0)
Potassium: 3.2 mmol/L — ABNORMAL LOW (ref 3.5–5.1)
SODIUM: 143 mmol/L (ref 135–145)
TCO2: 27 mmol/L (ref 22–32)

## 2017-07-25 SURGERY — CARDIOVERSION
Anesthesia: General

## 2017-07-25 MED ORDER — SODIUM CHLORIDE 0.9 % IV SOLN
INTRAVENOUS | Status: DC | PRN
  Administered 2017-07-25: 09:00:00 via INTRAVENOUS

## 2017-07-25 MED ORDER — PROPOFOL 10 MG/ML IV BOLUS
INTRAVENOUS | Status: DC | PRN
  Administered 2017-07-25: 20 mg via INTRAVENOUS
  Administered 2017-07-25: 90 mg via INTRAVENOUS

## 2017-07-25 MED ORDER — LIDOCAINE 2% (20 MG/ML) 5 ML SYRINGE
INTRAMUSCULAR | Status: DC | PRN
  Administered 2017-07-25: 100 mg via INTRAVENOUS

## 2017-07-25 NOTE — Anesthesia Postprocedure Evaluation (Signed)
Anesthesia Post Note  Patient: Trenton Gammonlan M Elderkin  Procedure(s) Performed: CARDIOVERSION (N/A )     Patient location during evaluation: Endoscopy Anesthesia Type: General Level of consciousness: awake and alert Pain management: pain level controlled Vital Signs Assessment: post-procedure vital signs reviewed and stable Respiratory status: spontaneous breathing, nonlabored ventilation, respiratory function stable and patient connected to nasal cannula oxygen Cardiovascular status: blood pressure returned to baseline and stable Postop Assessment: no apparent nausea or vomiting Anesthetic complications: no    Last Vitals:  Vitals:   07/25/17 0805 07/25/17 0915  BP: 137/77 (!) 143/87  Pulse: 85 75  Resp: (!) 26 19  Temp: 36.5 C 36.6 C  SpO2: 100% 99%    Last Pain:  Vitals:   07/25/17 0915  TempSrc: Oral  PainSc: 0-No pain                 Trevor IhaStephen A Houser

## 2017-07-25 NOTE — Transfer of Care (Signed)
Immediate Anesthesia Transfer of Care Note  Patient: Trenton Gammonlan M Debo  Procedure(s) Performed: CARDIOVERSION (N/A )  Patient Location: Endoscopy Unit  Anesthesia Type:General  Level of Consciousness: awake, alert  and oriented  Airway & Oxygen Therapy: Patient Spontanous Breathing  Post-op Assessment: Report given to RN, Post -op Vital signs reviewed and stable and Patient moving all extremities X 4  Post vital signs: Reviewed and stable  Last Vitals:  Vitals Value Taken Time  BP    Temp    Pulse    Resp    SpO2      Last Pain:  Vitals:   07/25/17 0805  TempSrc: Oral  PainSc: 0-No pain         Complications: No apparent anesthesia complications

## 2017-07-25 NOTE — Interval H&P Note (Signed)
History and Physical Interval Note:  07/25/2017 8:37 AM  Trenton GammonAlan M Conaway  has presented today for surgery, with the diagnosis of A-FIB  The various methods of treatment have been discussed with the patient and family. After consideration of risks, benefits and other options for treatment, the patient has consented to  Procedure(s): CARDIOVERSION (N/A) as a surgical intervention .  The patient's history has been reviewed, patient examined, no change in status, stable for surgery.  I have reviewed the patient's chart and labs.  Questions were answered to the patient's satisfaction.     Chilton [employer]iffany [employer], MD

## 2017-07-25 NOTE — Anesthesia Procedure Notes (Signed)
Procedure Name: General with mask airway Date/Time: 07/25/2017 9:01 AM Performed by: Nils PyleBell, Nevin Kozuch T, CRNA Pre-anesthesia Checklist: Patient identified, Emergency Drugs available, Suction available and Patient being monitored Patient Re-evaluated:Patient Re-evaluated prior to induction Oxygen Delivery Method: Ambu bag Preoxygenation: Pre-oxygenation with 100% oxygen Induction Type: IV induction Placement Confirmation: positive ETCO2 and breath sounds checked- equal and bilateral Dental Injury: Teeth and Oropharynx as per pre-operative assessment

## 2017-07-25 NOTE — CV Procedure (Signed)
Electrical Cardioversion Procedure Note Philip [name] [id number] June 04, 1960  Procedure: Electrical Cardioversion Indications:  Atrial Fibrillation  Procedure Details Consent: Risks of procedure as well as the alternatives and risks of each were explained to the (patient/caregiver).  Consent for procedure obtained. Time Out: Verified patient identification, verified procedure, site/side was marked, verified correct patient position, special equipment/implants available, medications/allergies/relevent history reviewed, required imaging and test results available.  Performed  Patient placed on cardiac monitor, pulse oximetry, supplemental oxygen as necessary.  Sedation given: propofol Pacer pads placed anterior and posterior chest.  Cardioverted 3 time(s).  Cardioverted at 150J, 200J, 200J.  All unsuccessful.  Evaluation Findings: Post procedure EKG shows: Atrial Fibrillation Complications: None Patient did tolerate procedure well.   [name], MD 07/25/2017, 9:10 AM

## 2017-07-25 NOTE — Discharge Instructions (Signed)
Electrical Cardioversion, Care After °This sheet gives you information about how to care for yourself after your procedure. Your health care provider may also give you more specific instructions. If you have problems or questions, contact your health care provider. °What can I expect after the procedure? °After the procedure, it is common to have: °· Some redness on the skin where the shocks were given. ° °Follow these instructions at home: °· Do not drive for 24 hours if you were given a medicine to help you relax (sedative). °· Take over-the-counter and prescription medicines only as told by your health care provider. °· Ask your health care provider how to check your pulse. Check it often. °· Rest for 48 hours after the procedure or as told by your health care provider. °· Avoid or limit your caffeine use as told by your health care provider. °Contact a health care provider if: °· You feel like your heart is beating too quickly or your pulse is not regular. °· You have a serious muscle cramp that does not go away. °Get help right away if: °· You have discomfort in your chest. °· You are dizzy or you feel faint. °· You have trouble breathing or you are short of breath. °· Your speech is slurred. °· You have trouble moving an arm or leg on one side of your body. °· Your fingers or toes turn cold or blue. °This information is not intended to replace advice given to you by your health care provider. Make sure you discuss any questions you have with your health care provider. °Document Released: 01/24/2013 Document Revised: 11/07/2015 Document Reviewed: 10/10/2015 °Elsevier Interactive Patient Education © 2018 [employer] ° °

## 2017-07-28 ENCOUNTER — Encounter: Payer: Self-pay | Admitting: Cardiology

## 2017-08-21 NOTE — Progress Notes (Signed)
HPI The patient presents for followup of bioprosthetic aortic valve replacement in 2005.  He had a follow up echo in Sept of last year.  This showed a NL bioprosthesis.  In December he was in the hospital with anemia and GI bleeding.  He had questionable AVMs seen on capsule endoscopy.  He did have some volume overload and was treated with IV Lasix and metolazone.  He was transferred to [facility] for a "deep" enteroscopy but this could not be completed.  The stomach was not visualized as I read it.   He did have atrial  fib while at [facility].  He eventually was restarted on Xarelto .   After the last visit I sent him for DCCV and he did not convert.  He returns to discuss next steps.  He feels like he is more fatigued and he is having continued problems with fluid which again is mostly in his abdomen.  He never felt this way prior to his anemia and atrial fibrillation.  He does not feel the palpitations and is not having any presyncope or syncope.  He gets short of breath bending over.  He does take a Zaroxolyn about once per week for weight gain.  His weight will fluctuate but come back down with this.  He is not having chest pressure, neck or arm discomfort.  He is not having PND or orthopnea.  His lightheadedness and dizziness seems to come in waves   Allergies  Allergen Reactions  . Nsaids Swelling and Other (See Comments)    NO REACTION LISTED, LIPS AND NECK  . Penicillins Hives, Swelling and Other (See Comments)    NO REACTION LISTED Has patient had a PCN reaction causing immediate rash, facial/tongue/throat swelling, SOB or lightheadedness with hypotension: Yes Has patient had a PCN reaction causing severe rash involving mucus membranes or skin necrosis: No Has patient had a PCN reaction that required hospitalization No Has patient had a PCN reaction occurring within the last 10 years: No If all of the above answers are "NO", then may proceed with Cephalosporin use.  Has taken Keflex w/o  issue      . Other Other (See Comments)    CYCLOOXYGENASE INHIBITORS-NO REACTION LISTED.    Current Outpatient Medications  Medication Sig Dispense Refill  . Ascorbic Acid (VITAMIN C) 1000 MG tablet Take 1,000 mg by mouth daily.    . B Complex-C (SUPER B COMPLEX PO) Take 1 tablet by mouth daily.     Marland Kitchen CALCIUM-VITAMIN D PO Take 1 tablet by mouth 2 (two) times daily.     . cetirizine (ZYRTEC ALLERGY) 10 MG tablet Take 10 mg by mouth daily. Reported on 09/05/2015    . esomeprazole (NEXIUM) 40 MG capsule Take 40 mg by mouth every evening.     . Ferrous Sulfate (IRON) 325 (65 Fe) MG TABS Take 325 mg by mouth daily.     . furosemide (LASIX) 40 MG tablet Take 1 tablet (40 mg total) by mouth daily. 30 tablet 6  . losartan (COZAAR) 25 MG tablet Take 25 mg by mouth every evening.     . Melatonin 3 MG CAPS Take 3 mg by mouth daily.    . metolazone (ZAROXOLYN) 5 MG tablet Take 1 tablet (5 mg total) by mouth once a week. Saturdays (Patient taking differently: Take 5 mg by mouth every Wednesday. ) 12 tablet 3  . metoprolol succinate (TOPROL-XL) 50 MG 24 hr tablet Take 1 tablet (50 mg total) by  mouth daily. Take with or immediately following a meal. (Patient taking differently: Take 50 mg by mouth every evening. Take with or immediately following a meal.) 90 tablet 3  . potassium chloride (KLOR-CON) 8 MEQ tablet Take 8 mEq by mouth daily.     . rivaroxaban (XARELTO) 20 MG TABS tablet Take 1 tablet (20 mg total) by mouth daily with supper. 90 tablet 1  . sertraline (ZOLOFT) 100 MG tablet Take 100 mg by mouth daily.    . simvastatin (ZOCOR) 10 MG tablet Take 10 mg by mouth every evening.     . sitaGLIPtin-metformin (JANUMET) 50-1000 MG tablet Take 1 tablet by mouth 2 (two) times daily with a meal.     . traZODone (DESYREL) 50 MG tablet Take 50 mg by mouth at bedtime.     . flecainide (TAMBOCOR) 100 MG tablet Take 1 tablet (100 mg total) by mouth 2 (two) times daily. 180 tablet 3   No current  facility-administered medications for this visit.     Past Medical History:  Diagnosis Date  . Arthritis   . Dental crowns present   . Diabetes mellitus    NIDDM  . Diabetic foot ulcer (HCC) 08/2011   right foot  . Dysrhythmia   . Fatty liver   . Gastric ulcer    no current problems  . GERD (gastroesophageal reflux disease)   . Gout   . 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 History of bronchitis as a child   . 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 Hx of bacterial endocarditis 11/2003  . Hypertension    under control; states med. is really to protect kidneys due to diabetes  . Neuropathy    feet bilat   . Obesity   . Pneumonia    history of as child   . Sleep apnea    uses CPAP nightly  . 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 adenoid surgery     . AORTIC VALVE REPLACEMENT  11/2003  . CARDIAC CATHETERIZATION    . CARDIOVERSION N/A 07/25/2017   Procedure: CARDIOVERSION;  Surgeon: Chilton Si, MD;  Location: [facility] ENDOSCOPY;  Service: Cardiovascular;  Laterality: N/A;  . COLONOSCOPY WITH PROPOFOL N/A 03/30/2017   Procedure: COLONOSCOPY WITH PROPOFOL;  Surgeon: Benancio Deeds, MD;  Location: [facility] ENDOSCOPY;  Service: Gastroenterology;  Laterality: N/A;  . ESOPHAGOGASTRODUODENOSCOPY (EGD) WITH PROPOFOL N/A 03/29/2017   Procedure: ESOPHAGOGASTRODUODENOSCOPY (EGD) WITH PROPOFOL;  Surgeon: Benancio Deeds, MD;  Location: [facility] ENDOSCOPY;  Service: Gastroenterology;  Laterality: N/A;  . GASTRIC BYPASS  03/2004  . GIVENS CAPSULE STUDY N/A 03/30/2017   Procedure: GIVENS CAPSULE STUDY;  Surgeon: Benancio Deeds, MD;  Location: [facility] ENDOSCOPY;  Service: Gastroenterology;  Laterality: N/A;  . KNEE ARTHROSCOPY  08/09/2007 - left   07/15/2003 - right  . METATARSAL OSTEOTOMY  03/20/2010   right 1st MT; gastroc soleus recession    . NASAL SINUS SURGERY    . TENDON RELEASE  09/09/2011   Procedure: HEEL CORD LENGTHENING;  Surgeon: Toni Arthurs, MD;  Location: [facility];  Service: Orthopedics;  Laterality: Right;  Righ tachilles tendon lengthening  . TOE FUSION  07/12/2005   left 3rd toe PIP and DIP fusion  . TONSILLECTOMY AND ADENOIDECTOMY    . TOTAL KNEE ARTHROPLASTY Left 03/18/2015  Procedure: LEFT TOTAL KNEE ARTHROPLASTY;  Surgeon: Durene Romans, MD;  Location: [facility];  Service: Orthopedics;  Laterality: Left;    ROS:  As stated in the HPI and negative for all other systems.  PHYSICAL EXAM BP 112/79   Pulse 79   Ht  (1.956 m)   Wt (!) 349 lb 9.6 oz (158.6 kg)   BMI 41.46 kg/m   GENERAL:  Well appearing NECK:  No jugular venous distention, waveform within normal limits, carotid upstroke brisk and symmetric, no bruits, no thyromegaly LUNGS:  Clear to auscultation bilaterally CHEST:  ICD pocket MI not displaced or sustained,S1 and S2 within normal limits, no S3, no clicks, no rubs, no murmurs, irregular ABD:  Flat, positive bowel sounds normal in frequency in pitch, no bruits, no rebound, no guarding, no midline pulsatile mass, no hepatomegaly, no splenomegaly, obese EXT:  2 plus pulses throughout, no edema, no cyanosis no clubbing   EKG: Atrial fibrillation, rate 79, axis within normal limits, intervals within normal limitsborderline interventricular conduction delay QTC within normal limits,  ASSESSMENT AND PLAN  AVR - He had normal valve function on echo in September 2019.  He understands endocarditis prophylaxis.  He had previous endocarditis.  No change in therapy.   MR - He had stable repair on echo in Sept.   There is mild MR.  No imagining indicated at this time.   ATRIAL FIB -   Mr. TILMON WISEHART has a CHA2DS2 - VASc score of 2.   He may well be symptomatic with this.  Going to get a POET (Plain Old Exercise Treadmill) to make sure that he has  no evidence of's obstructive  coronary disease.  His had normal LV function.  Provided the treadmill is okay I will start flecainide 50 mg twice daily increasing to 100 mg twice daily.  He will then have a follow-up in the Atrial Fibrillation clinic to be set up again for possible cardioversion.    HTN - The blood pressure is controlled.  No med changes for this.   OBESITY - We have talked about this over time.   FATIGUE -  Labs recently to include BMET,  CBC and TSH were WNL.

## 2017-08-22 ENCOUNTER — Encounter: Payer: Self-pay | Admitting: Cardiology

## 2017-08-22 ENCOUNTER — Other Ambulatory Visit: Payer: Self-pay | Admitting: Cardiology

## 2017-08-22 ENCOUNTER — Ambulatory Visit (INDEPENDENT_AMBULATORY_CARE_PROVIDER_SITE_OTHER): Payer: BLUE CROSS/BLUE SHIELD | Admitting: Cardiology

## 2017-08-22 VITALS — BP 112/79 | HR 79 | Ht 77.0 in | Wt 349.6 lb

## 2017-08-22 DIAGNOSIS — I482 Chronic atrial fibrillation: Secondary | ICD-10-CM | POA: Diagnosis not present

## 2017-08-22 DIAGNOSIS — R5383 Other fatigue: Secondary | ICD-10-CM

## 2017-08-22 DIAGNOSIS — R0602 Shortness of breath: Secondary | ICD-10-CM | POA: Diagnosis not present

## 2017-08-22 DIAGNOSIS — I4821 Permanent atrial fibrillation: Secondary | ICD-10-CM

## 2017-08-22 MED ORDER — FLECAINIDE ACETATE 100 MG PO TABS: 100.0000 mg | ORAL_TABLET | Freq: Two times a day (BID) | ORAL | 3 refills | Status: DC

## 2017-08-22 NOTE — Patient Instructions (Signed)
Medication Instructions:  START- Flecainide 100 mg take 50 mg (1/2 tablet) twice a day for 2 days then INCREASE 1 tablet twice a day  If you need a refill on your cardiac medications before your next appointment, please call your pharmacy.  Labwork: BMP Today HERE IN OUR OFFICE AT [facility]  Take the provided lab slips with you to the lab for your blood draw.   You will NOT need to fast   Testing/Procedures: Your physician has requested that you have an exercise tolerance test. For further information please visit [email or web address] Please also follow instruction sheet, as given.   Follow-Up: Your physician wants you to follow-up in: Rudi Coco in A-fib clinic in 1 week.      Thank you for choosing [facility] at [facility]!!

## 2017-08-23 ENCOUNTER — Telehealth (HOSPITAL_COMMUNITY): Payer: Self-pay

## 2017-08-23 LAB — BASIC METABOLIC PANEL
BUN / CREAT RATIO: 17 (ref 9–20)
BUN: 18 mg/dL (ref 6–24)
CO2: 26 mmol/L (ref 20–29)
Calcium: 8.9 mg/dL (ref 8.7–10.2)
Chloride: 101 mmol/L (ref 96–106)
Creatinine, Ser: 1.03 mg/dL (ref 0.76–1.27)
GFR, EST AFRICAN AMERICAN: 93 mL/min/{1.73_m2} (ref >59)
GFR, EST NON AFRICAN AMERICAN: 81 mL/min/{1.73_m2} (ref >59)
Glucose: 122 mg/dL — ABNORMAL HIGH (ref 65–99)
POTASSIUM: 4 mmol/L (ref 3.5–5.2)
SODIUM: 144 mmol/L (ref 134–144)

## 2017-08-23 NOTE — Telephone Encounter (Signed)
Encounter complete. 

## 2017-08-25 ENCOUNTER — Ambulatory Visit (HOSPITAL_COMMUNITY)
Admission: RE | Admit: 2017-08-25 | Discharge: 2017-08-25 | Disposition: A | Discharge status: Home / Self Care | Payer: BLUE CROSS/BLUE SHIELD | Source: Ambulatory Visit | Attending: Cardiovascular Disease | Admitting: Cardiovascular Disease

## 2017-08-25 DIAGNOSIS — I482 Chronic atrial fibrillation: Secondary | ICD-10-CM

## 2017-08-25 DIAGNOSIS — R0602 Shortness of breath: Secondary | ICD-10-CM

## 2017-08-25 DIAGNOSIS — I4821 Permanent atrial fibrillation: Secondary | ICD-10-CM

## 2017-08-25 DIAGNOSIS — R5383 Other fatigue: Secondary | ICD-10-CM | POA: Diagnosis present

## 2017-08-25 LAB — EXERCISE TOLERANCE TEST
CSEPED: 1 min
Estimated workload: 3.2 METS
Exercise duration (sec): 14 s
MPHR: 164 {beats}/min
Peak HR: 121 {beats}/min
Percent HR: 73 %
RPE: 19
Rest HR: 79 {beats}/min

## 2017-08-29 ENCOUNTER — Encounter: Payer: Self-pay | Admitting: Cardiology

## 2017-08-30 ENCOUNTER — Ambulatory Visit (HOSPITAL_COMMUNITY)
Admission: RE | Admit: 2017-08-30 | Discharge: 2017-08-30 | Disposition: A | Discharge status: Home / Self Care | Payer: BLUE CROSS/BLUE SHIELD | Source: Ambulatory Visit | Attending: Nurse Practitioner | Admitting: Nurse Practitioner

## 2017-08-30 ENCOUNTER — Encounter (HOSPITAL_COMMUNITY): Payer: Self-pay | Admitting: Nurse Practitioner

## 2017-08-30 VITALS — BP 108/72 | HR 73 | Ht 77.0 in | Wt 341.8 lb

## 2017-08-30 DIAGNOSIS — K219 Gastro-esophageal reflux disease without esophagitis: Secondary | ICD-10-CM | POA: Diagnosis not present

## 2017-08-30 DIAGNOSIS — Z800 Family history of malignant neoplasm of digestive organs: Secondary | ICD-10-CM | POA: Insufficient documentation

## 2017-08-30 DIAGNOSIS — Z7984 Long term (current) use of oral hypoglycemic drugs: Secondary | ICD-10-CM | POA: Diagnosis not present

## 2017-08-30 DIAGNOSIS — Z7901 Long term (current) use of anticoagulants: Secondary | ICD-10-CM | POA: Diagnosis not present

## 2017-08-30 DIAGNOSIS — Z96652 Presence of left artificial knee joint: Secondary | ICD-10-CM | POA: Insufficient documentation

## 2017-08-30 DIAGNOSIS — E669 Obesity, unspecified: Secondary | ICD-10-CM | POA: Insufficient documentation

## 2017-08-30 DIAGNOSIS — I10 Essential (primary) hypertension: Secondary | ICD-10-CM | POA: Diagnosis not present

## 2017-08-30 DIAGNOSIS — G4733 Obstructive sleep apnea (adult) (pediatric): Secondary | ICD-10-CM | POA: Insufficient documentation

## 2017-08-30 DIAGNOSIS — Z886 Allergy status to analgesic agent status: Secondary | ICD-10-CM | POA: Diagnosis not present

## 2017-08-30 DIAGNOSIS — Z8719 Personal history of other diseases of the digestive system: Secondary | ICD-10-CM | POA: Insufficient documentation

## 2017-08-30 DIAGNOSIS — I4819 Other persistent atrial fibrillation: Secondary | ICD-10-CM

## 2017-08-30 DIAGNOSIS — I4891 Unspecified atrial fibrillation: Secondary | ICD-10-CM | POA: Diagnosis not present

## 2017-08-30 DIAGNOSIS — I481 Persistent atrial fibrillation: Secondary | ICD-10-CM | POA: Diagnosis not present

## 2017-08-30 DIAGNOSIS — Z9889 Other specified postprocedural states: Secondary | ICD-10-CM | POA: Diagnosis not present

## 2017-08-30 DIAGNOSIS — Z833 Family history of diabetes mellitus: Secondary | ICD-10-CM | POA: Diagnosis not present

## 2017-08-30 DIAGNOSIS — Z880 Allergy status to penicillin: Secondary | ICD-10-CM | POA: Diagnosis not present

## 2017-08-30 DIAGNOSIS — Z9884 Bariatric surgery status: Secondary | ICD-10-CM | POA: Diagnosis not present

## 2017-08-30 DIAGNOSIS — Z952 Presence of prosthetic heart valve: Secondary | ICD-10-CM | POA: Insufficient documentation

## 2017-08-30 DIAGNOSIS — Z87891 Personal history of nicotine dependence: Secondary | ICD-10-CM | POA: Insufficient documentation

## 2017-08-30 DIAGNOSIS — Z79899 Other long term (current) drug therapy: Secondary | ICD-10-CM | POA: Diagnosis not present

## 2017-08-30 DIAGNOSIS — E1140 Type 2 diabetes mellitus with diabetic neuropathy, unspecified: Secondary | ICD-10-CM | POA: Insufficient documentation

## 2017-08-30 NOTE — Patient Instructions (Signed)
Start Flecainide  twice a day (1/2 tab of the  tab)

## 2017-08-30 NOTE — Progress Notes (Signed)
Primary Care Physician: [name], MD Referring Physician: Dr. Truman Hayward Philip [name] is a 57 y.o. male with a h/o  bioprosthetic aortic valve replacement in 2005, HTN, DM, OSA treated with CPAP, in the afib clinic for evaluation for persistent afib since [facility] 2018. This was found at [facility] where pt was evaluated for a GI bleed in December. The source of bleeding was never identified.  He was placed on xarelto then and has not has a reoccurrence of bleeding. He had one cardioversion in March and it was not successful.He recently was seen by Dr. Antoine Poche with plans to start flecainide. He had a ETT, although his exercise tolerance was poor, there were no high risk findings for CAD,  but pt did not receive the message to start until late yesterday so he did not start drug as of yet.  Today, he denies symptoms of palpitations, chest pain, shortness of breath, orthopnea, PND, lower extremity edema, dizziness, presyncope, syncope, or neurologic sequela. The patient is tolerating medications without difficulties and is otherwise without complaint today.   Past Medical History:  Diagnosis Date  . Arthritis   . Dental crowns present   . Diabetes mellitus    NIDDM  . Diabetic foot ulcer (HCC) 08/2011   right foot  . Dysrhythmia   . Fatty liver   . Gastric ulcer    no current problems  . GERD (gastroesophageal reflux disease)   . Gout   . 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 History of bronchitis as a child   . 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 Hx of bacterial endocarditis 11/2003  . Hypertension    under control; states med. is really to protect kidneys due to diabetes  . Neuropathy    feet bilat   . Obesity   . Pneumonia    history of as child   . Sleep apnea    uses CPAP nightly  . 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 Toe deformity 08/2011   right claw hallux   Past Surgical History:    Procedure Laterality Date  . adenoid surgery     . AORTIC VALVE REPLACEMENT  11/2003  . CARDIAC CATHETERIZATION    . CARDIOVERSION N/A 07/25/2017   Procedure: CARDIOVERSION;  Surgeon: Chilton Si, MD;  Location: [facility] ENDOSCOPY;  Service: Cardiovascular;  Laterality: N/A;  . COLONOSCOPY WITH PROPOFOL N/A 03/30/2017   Procedure: COLONOSCOPY WITH PROPOFOL;  Surgeon: Benancio Deeds, MD;  Location: [facility];  Service: Gastroenterology;  Laterality: N/A;  . ESOPHAGOGASTRODUODENOSCOPY (EGD) WITH PROPOFOL N/A 03/29/2017   Procedure: ESOPHAGOGASTRODUODENOSCOPY (EGD) WITH PROPOFOL;  Surgeon: Benancio Deeds, MD;  Location: [facility];  Service: Gastroenterology;  Laterality: N/A;  . GASTRIC BYPASS  03/2004  . GIVENS CAPSULE STUDY N/A 03/30/2017   Procedure: GIVENS CAPSULE STUDY;  Surgeon: Benancio Deeds, MD;  Location: [facility];  Service: Gastroenterology;  Laterality: N/A;  . KNEE ARTHROSCOPY  08/09/2007 - left   07/15/2003 - right  . METATARSAL OSTEOTOMY  03/20/2010   right 1st MT; gastroc soleus recession  . NASAL SINUS SURGERY    . TENDON RELEASE  09/09/2011   Procedure: HEEL CORD LENGTHENING;  Surgeon: Toni Arthurs, MD;  Location: [facility];  Service: Orthopedics;  Laterality: Right;  Righ tachilles tendon lengthening  . TOE FUSION  07/12/2005   left 3rd toe PIP and DIP fusion  . TONSILLECTOMY  AND ADENOIDECTOMY    . TOTAL KNEE ARTHROPLASTY Left 03/18/2015   Procedure: LEFT TOTAL KNEE ARTHROPLASTY;  Surgeon: Durene Romans, MD;  Location: [facility];  Service: Orthopedics;  Laterality: Left;    Current Outpatient Medications  Medication Sig Dispense Refill  . Ascorbic Acid (VITAMIN C) 1000 MG tablet Take 1,000 mg by mouth daily.    . B Complex-C (SUPER B COMPLEX PO) Take 1 tablet by mouth daily.     Marland Kitchen CALCIUM-VITAMIN D PO Take 1 tablet by mouth 2 (two) times daily.     . cetirizine (ZYRTEC ALLERGY) 10 MG tablet Take 10 mg by mouth daily. Reported on  09/05/2015    . esomeprazole (NEXIUM) 40 MG capsule Take 40 mg by mouth every evening.     . Ferrous Sulfate (IRON) 325 (65 Fe) MG TABS Take 325 mg by mouth daily.     . furosemide (LASIX) 40 MG tablet Take 1 tablet (40 mg total) by mouth daily. 30 tablet 6  . losartan (COZAAR) 25 MG tablet Take 25 mg by mouth every evening.     . Melatonin 3 MG CAPS Take 3 mg by mouth daily.    . metolazone (ZAROXOLYN) 5 MG tablet Take 1 tablet (5 mg total) by mouth once a week. Saturdays (Patient taking differently: Take 5 mg by mouth every Wednesday. ) 12 tablet 3  . metoprolol succinate (TOPROL-XL) 50 MG 24 hr tablet Take 1 tablet (50 mg total) by mouth daily. Take with or immediately following a meal. (Patient taking differently: Take 50 mg by mouth every evening. Take with or immediately following a meal.) 90 tablet 3  . potassium chloride (KLOR-CON) 8 MEQ tablet Take 8 mEq by mouth daily.     . rivaroxaban (XARELTO) 20 MG TABS tablet Take 1 tablet (20 mg total) by mouth daily with supper. 90 tablet 1  . sertraline (ZOLOFT) 100 MG tablet Take 100 mg by mouth daily.    . simvastatin (ZOCOR) 10 MG tablet Take 10 mg by mouth every evening.     . sitaGLIPtin-metformin (JANUMET) 50-1000 MG tablet Take 1 tablet by mouth 2 (two) times daily with a meal.     . traZODone (DESYREL) 50 MG tablet Take 50 mg by mouth at bedtime.     . flecainide (TAMBOCOR) 100 MG tablet Take 1 tablet (100 mg total) by mouth 2 (two) times daily. 180 tablet 3   No current facility-administered medications for this encounter.     Allergies  Allergen Reactions  . Nsaids Swelling and Other (See Comments)    NO REACTION LISTED, LIPS AND NECK  . Penicillins Hives, Swelling and Other (See Comments)    NO REACTION LISTED Has patient had a PCN reaction causing immediate rash, facial/tongue/throat swelling, SOB or lightheadedness with hypotension: Yes Has patient had a PCN reaction causing severe rash involving mucus membranes or skin  necrosis: No Has patient had a PCN reaction that required hospitalization No Has patient had a PCN reaction occurring within the last 10 years: No If all of the above answers are "NO", then may proceed with Cephalosporin use.  Has taken Keflex w/o issue      . Other Other (See Comments)    CYCLOOXYGENASE INHIBITORS-NO REACTION LISTED.    Social History   Socioeconomic History  . Marital status: Married    Spouse name: Not on file  . Number of children: Not on file  . Years of education: Not on file  . Highest education level: Not on  file  Occupational History  . Not on file  Social Needs  . Financial resource strain: Not on file  . Food insecurity:    Worry: Not on file    Inability: Not on file  . Transportation needs:    Medical: Not on file    Non-medical: Not on file  Tobacco Use  . Smoking status: Former Smoker    Packs/day: 1.00    Years: 2.00    Pack years: 2.00    Types: Cigars, Pipe, Cigarettes    Last attempt to quit: 01/31/2011    Years since quitting: 6.5  . Smokeless tobacco: Never Used  . Tobacco comment: smokes 1-2 cigars/week  Substance and Sexual Activity  . Alcohol use: Not Currently    Alcohol/week: 0.0 oz  . Drug use: No  . Sexual activity: Not on file  Lifestyle  . Physical activity:    Days per week: Not on file    Minutes per session: Not on file  . Stress: Not on file  Relationships  . Social connections:    Talks on phone: Not on file    Gets together: Not on file    Attends religious service: Not on file    Active member of club or organization: Not on file    Attends meetings of clubs or organizations: Not on file    Relationship status: Not on file  . Intimate partner violence:    Fear of current or ex partner: Not on file    Emotionally abused: Not on file    Physically abused: Not on file    Forced sexual activity: Not on file  Other Topics Concern  . Not on file  Social History Narrative  . Not on file    Family  History  Problem Relation Age of Onset  . Alzheimer's disease Mother   . Diabetes Father   . Colon cancer Unknown        family history    ROS- All systems are reviewed and negative except as per the HPI above  Physical Exam: Vitals:   08/30/17 0938  BP: 108/72  Pulse: 73  Weight: (!) 341 lb 12.8 oz (155 kg)  Height:  (1.956 m)   Wt Readings from Last 3 Encounters:  08/30/17 (!) 341 lb 12.8 oz (155 kg)  08/22/17 (!) 349 lb 9.6 oz (158.6 kg)  07/25/17 (!) 338 lb (153.3 kg)    Labs: Lab Results  Component Value Date   NA 144 08/22/2017   K 4.0 08/22/2017   CL 101 08/22/2017   CO2 26 08/22/2017   GLUCOSE 122 (H) 08/22/2017   BUN 18 08/22/2017   CREATININE 1.03 08/22/2017   CALCIUM 8.9 08/22/2017   Lab Results  Component Value Date   INR 1.3 (H) 07/14/2017   No results found for: CHOL, HDL, LDLCALC, TRIG   GEN- The patient is well appearing, alert and oriented x 3 today.   Head- normocephalic, atraumatic Eyes-  Sclera clear, conjunctiva pink Ears- hearing intact Oropharynx- clear Neck- supple, no JVP Lymph- no cervical lymphadenopathy Lungs- Clear to ausculation bilaterally, normal work of breathing Heart- Regular rate and rhythm, no murmurs, rubs or gallops, PMI not laterally displaced GI- soft, NT, ND, + BS Extremities- no clubbing, cyanosis, or edema MS- no significant deformity or atrophy Skin- no rash or lesion Psych- euthymic mood, full affect Neuro- strength and sensation are intact  EKG-afib at 73 bpm, qrs int 110 ms, qtc 467 ms    Assessment  and Plan: 1.Persisitent  afib General education re afib triggers discussed Pt is not drinking any alcohol, or using tobacco, or excessive caffeine He is religiously using cpap He will continue metoprolol  No missed doses of xarelto for at least 3 weeks, CHA2DS2VASc score is at least  2 Start flecainide 50 mg bid  F/u Friday with ekg on flecainide, if EKG ok will increase to 100 mg bid and schedule   Cardioversion  Travers Goodley C. [facility] Fol[facility] [address] [place], [place] [address] [phone]

## 2017-09-02 ENCOUNTER — Ambulatory Visit (HOSPITAL_COMMUNITY)
Admission: RE | Admit: 2017-09-02 | Discharge: 2017-09-02 | Disposition: A | Discharge status: Home / Self Care | Payer: BLUE CROSS/BLUE SHIELD | Source: Ambulatory Visit | Attending: Nurse Practitioner | Admitting: Nurse Practitioner

## 2017-09-02 ENCOUNTER — Other Ambulatory Visit (HOSPITAL_COMMUNITY): Payer: Self-pay | Admitting: *Deleted

## 2017-09-02 DIAGNOSIS — I4891 Unspecified atrial fibrillation: Secondary | ICD-10-CM | POA: Diagnosis not present

## 2017-09-02 DIAGNOSIS — I447 Left bundle-branch block, unspecified: Secondary | ICD-10-CM | POA: Diagnosis not present

## 2017-09-02 DIAGNOSIS — I4581 Long QT syndrome: Secondary | ICD-10-CM | POA: Insufficient documentation

## 2017-09-02 LAB — CBC
HCT: 38 % — ABNORMAL LOW (ref 39.0–52.0)
HEMOGLOBIN: 11.5 g/dL — AB (ref 13.0–17.0)
MCH: 26.4 pg (ref 26.0–34.0)
MCHC: 30.3 g/dL (ref 30.0–36.0)
MCV: 87.4 fL (ref 78.0–100.0)
PLATELETS: 170 10*3/uL (ref 150–400)
RBC: 4.35 MIL/uL (ref 4.22–5.81)
RDW: 17.2 % — ABNORMAL HIGH (ref 11.5–15.5)
WBC: 5.4 10*3/uL (ref 4.0–10.5)

## 2017-09-02 LAB — BASIC METABOLIC PANEL
ANION GAP: 9 (ref 5–15)
BUN: 18 mg/dL (ref 6–20)
CHLORIDE: 103 mmol/L (ref 101–111)
CO2: 28 mmol/L (ref 22–32)
Calcium: 8.8 mg/dL — ABNORMAL LOW (ref 8.9–10.3)
Creatinine, Ser: 1.22 mg/dL (ref 0.61–1.24)
GFR calc Af Amer: >60 mL/min (ref >60)
Glucose, Bld: 135 mg/dL — ABNORMAL HIGH (ref 65–99)
Potassium: 3.3 mmol/L — ABNORMAL LOW (ref 3.5–5.1)
SODIUM: 140 mmol/L (ref 135–145)

## 2017-09-02 MED ORDER — POTASSIUM CHLORIDE CRYS ER 20 MEQ PO TBCR: EXTENDED_RELEASE_TABLET | ORAL | 0 refills | Status: DC

## 2017-09-02 MED ORDER — POTASSIUM CHLORIDE CRYS ER 20 MEQ PO TBCR: 20.0000 meq | EXTENDED_RELEASE_TABLET | Freq: Every day | ORAL | 2 refills | Status: DC

## 2017-09-02 NOTE — Progress Notes (Signed)
Pt in for repeat EKG after starting Flecainide.   To be reviewed by Rudi Coco, NP

## 2017-09-02 NOTE — Progress Notes (Signed)
Reviewed EKG with Philip Balsam, NP -- intervals ok - increase flecainide to  BID and cardioversion next week. EKG prior to cardioversion. No missed doses of Xarelto per patient. Dccv scheduled for 5/22 with Dr. Rennis Golden.

## 2017-09-02 NOTE — Patient Instructions (Addendum)
Increase flecainide to  twice a day  Cardioversion scheduled for Wednesday, May 22nd  -Come to afib clinic at 12:45pm for EKG   - Arrive at the [facility] and go to admitting at 1:00PM  -Do not eat or drink anything after midnight the night prior to your procedure.  - Take all your medication with a sip of water prior to arrival.  - You will not be able to drive home after your procedure.

## 2017-09-07 ENCOUNTER — Encounter (HOSPITAL_COMMUNITY): Payer: Self-pay | Admitting: *Deleted

## 2017-09-07 ENCOUNTER — Encounter (HOSPITAL_COMMUNITY)
Admission: RE | Disposition: A | Discharge status: Home / Self Care | Payer: Self-pay | Source: Ambulatory Visit | Attending: Internal Medicine

## 2017-09-07 ENCOUNTER — Ambulatory Visit (HOSPITAL_COMMUNITY): Payer: BLUE CROSS/BLUE SHIELD | Admitting: Anesthesiology

## 2017-09-07 ENCOUNTER — Encounter (HOSPITAL_COMMUNITY): Payer: Self-pay | Admitting: Nurse Practitioner

## 2017-09-07 ENCOUNTER — Other Ambulatory Visit: Payer: Self-pay

## 2017-09-07 ENCOUNTER — Ambulatory Visit (HOSPITAL_COMMUNITY)
Admission: RE | Admit: 2017-09-07 | Discharge: 2017-09-07 | Disposition: A | Discharge status: Home / Self Care | Payer: BLUE CROSS/BLUE SHIELD | Source: Ambulatory Visit | Attending: Internal Medicine | Admitting: Internal Medicine

## 2017-09-07 ENCOUNTER — Ambulatory Visit (HOSPITAL_COMMUNITY)
Admission: RE | Admit: 2017-09-07 | Discharge: 2017-09-07 | Disposition: A | Discharge status: Home / Self Care | Payer: BLUE CROSS/BLUE SHIELD | Source: Ambulatory Visit | Attending: Nurse Practitioner | Admitting: Nurse Practitioner

## 2017-09-07 DIAGNOSIS — I10 Essential (primary) hypertension: Secondary | ICD-10-CM | POA: Diagnosis not present

## 2017-09-07 DIAGNOSIS — Z7984 Long term (current) use of oral hypoglycemic drugs: Secondary | ICD-10-CM | POA: Insufficient documentation

## 2017-09-07 DIAGNOSIS — E1151 Type 2 diabetes mellitus with diabetic peripheral angiopathy without gangrene: Secondary | ICD-10-CM | POA: Diagnosis not present

## 2017-09-07 DIAGNOSIS — M1990 Unspecified osteoarthritis, unspecified site: Secondary | ICD-10-CM | POA: Insufficient documentation

## 2017-09-07 DIAGNOSIS — Z87891 Personal history of nicotine dependence: Secondary | ICD-10-CM | POA: Insufficient documentation

## 2017-09-07 DIAGNOSIS — E669 Obesity, unspecified: Secondary | ICD-10-CM | POA: Diagnosis not present

## 2017-09-07 DIAGNOSIS — I481 Persistent atrial fibrillation: Secondary | ICD-10-CM

## 2017-09-07 DIAGNOSIS — Z6839 Body mass index (BMI) 39.0-39.9, adult: Secondary | ICD-10-CM | POA: Diagnosis not present

## 2017-09-07 DIAGNOSIS — I4891 Unspecified atrial fibrillation: Secondary | ICD-10-CM | POA: Insufficient documentation

## 2017-09-07 DIAGNOSIS — G4730 Sleep apnea, unspecified: Secondary | ICD-10-CM | POA: Diagnosis not present

## 2017-09-07 DIAGNOSIS — K219 Gastro-esophageal reflux disease without esophagitis: Secondary | ICD-10-CM | POA: Insufficient documentation

## 2017-09-07 HISTORY — PX: CARDIOVERSION: SHX1299

## 2017-09-07 LAB — BASIC METABOLIC PANEL
ANION GAP: 9 (ref 5–15)
BUN: 26 mg/dL — AB (ref 6–20)
CHLORIDE: 102 mmol/L (ref 101–111)
CO2: 30 mmol/L (ref 22–32)
Calcium: 9.3 mg/dL (ref 8.9–10.3)
Creatinine, Ser: 1.24 mg/dL (ref 0.61–1.24)
Glucose, Bld: 124 mg/dL — ABNORMAL HIGH (ref 65–99)
Potassium: 3.5 mmol/L (ref 3.5–5.1)
SODIUM: 141 mmol/L (ref 135–145)

## 2017-09-07 LAB — GLUCOSE, CAPILLARY: GLUCOSE-CAPILLARY: 93 mg/dL (ref 65–99)

## 2017-09-07 SURGERY — CARDIOVERSION
Anesthesia: General

## 2017-09-07 MED ORDER — PROPOFOL 10 MG/ML IV BOLUS
INTRAVENOUS | Status: DC | PRN
  Administered 2017-09-07: 100 mg via INTRAVENOUS
  Administered 2017-09-07: 40 mg via INTRAVENOUS

## 2017-09-07 MED ORDER — LIDOCAINE 2% (20 MG/ML) 5 ML SYRINGE
INTRAMUSCULAR | Status: DC | PRN
  Administered 2017-09-07: 60 mg via INTRAVENOUS

## 2017-09-07 MED ORDER — SODIUM CHLORIDE 0.9 % IV SOLN
INTRAVENOUS | Status: DC | PRN
  Administered 2017-09-07: 14:00:00 via INTRAVENOUS

## 2017-09-07 NOTE — Transfer of Care (Signed)
Immediate Anesthesia Transfer of Care Note  Patient: Philip [name]  Procedure(s) Performed: CARDIOVERSION (N/A )  Patient Location: Endoscopy Unit  Anesthesia Type:General  Level of Consciousness: drowsy and responds to stimulation  Airway & Oxygen Therapy: Patient Spontanous Breathing  Post-op Assessment: Report given to RN and Post -op Vital signs reviewed and stable  Post vital signs: Reviewed and stable  Last Vitals:  Vitals Value Taken Time  BP 123/79 09/07/2017  2:13 PM  Temp    Pulse 66 09/07/2017  2:15 PM  Resp 20 09/07/2017  2:15 PM  SpO2 90 % 09/07/2017  2:15 PM    Last Pain:  Vitals:   09/07/17 1245  TempSrc: Oral  PainSc: 0-No pain         Complications: No apparent anesthesia complications

## 2017-09-07 NOTE — Anesthesia Procedure Notes (Signed)
Procedure Name: General with mask airway Date/Time: 09/07/2017 2:06 PM Performed by: White, Cordella Register, CRNA Pre-anesthesia Checklist: Patient identified, Emergency Drugs available, Patient being monitored and Suction available Patient Re-evaluated:Patient Re-evaluated prior to induction Oxygen Delivery Method: Ambu bag Preoxygenation: Pre-oxygenation with 100% oxygen Induction Type: IV induction

## 2017-09-07 NOTE — Anesthesia Preprocedure Evaluation (Addendum)
Anesthesia Evaluation  Patient identified by MRN, date of birth, ID band Patient awake    Reviewed: Allergy & Precautions, H&P , NPO status , Patient's Chart, lab work & pertinent test results, reviewed documented beta blocker date and time   Airway Mallampati: II  TM Distance: >3 FB Neck ROM: Full    Dental no notable dental hx. (+) Teeth Intact, Dental Advisory Given   Pulmonary sleep apnea and Continuous Positive Airway Pressure Ventilation , former smoker,    Pulmonary exam normal breath sounds clear to auscultation       Cardiovascular hypertension, Pt. on medications and Pt. on home beta blockers + Peripheral Vascular Disease  + dysrhythmias Atrial Fibrillation  Rhythm:Irregular Rate:Normal     Neuro/Psych negative neurological ROS  negative psych ROS   GI/Hepatic Neg liver ROS, PUD, GERD  Medicated and Controlled,  Endo/Other  diabetes, Type 2, Oral Hypoglycemic AgentsMorbid obesity  Renal/GU negative Renal ROS  negative genitourinary   Musculoskeletal  (+) Arthritis , Osteoarthritis,    Abdominal   Peds  Hematology negative hematology ROS (+)   Anesthesia Other Findings   Reproductive/Obstetrics negative OB ROS                            Anesthesia Physical Anesthesia Plan  ASA: III  Anesthesia Plan: General   Post-op Pain Management:    Induction: Intravenous  PONV Risk Score and Plan: 2 and Treatment may vary due to age or medical condition  Airway Management Planned: Mask  Additional Equipment:   Intra-op Plan:   Post-operative Plan:   Informed Consent: I have reviewed the patients History and Physical, chart, labs and discussed the procedure including the risks, benefits and alternatives for the proposed anesthesia with the patient or authorized representative who has indicated his/her understanding and acceptance.   Dental advisory given  Plan Discussed with:  CRNA  Anesthesia Plan Comments:         Anesthesia Quick Evaluation

## 2017-09-07 NOTE — Progress Notes (Addendum)
After patient's cardioversion, Stacy called from Darvin Neighbours (patient's NP) office to instruct patient to stop taking flecainide.  Patient and family given instructions, verbalized understanding.  Roselie Awkward, RN

## 2017-09-07 NOTE — Discharge Instructions (Signed)
Electrical Cardioversion, Care After °This sheet gives you information about how to care for yourself after your procedure. Your health care provider may also give you more specific instructions. If you have problems or questions, contact your health care provider. °What can I expect after the procedure? °After the procedure, it is common to have: °· Some redness on the skin where the shocks were given. ° °Follow these instructions at home: °· Do not drive for 24 hours if you were given a medicine to help you relax (sedative). °· Take over-the-counter and prescription medicines only as told by your health care provider. °· Ask your health care provider how to check your pulse. Check it often. °· Rest for 48 hours after the procedure or as told by your health care provider. °· Avoid or limit your caffeine use as told by your health care provider. °Contact a health care provider if: °· You feel like your heart is beating too quickly or your pulse is not regular. °· You have a serious muscle cramp that does not go away. °Get help right away if: °· You have discomfort in your chest. °· You are dizzy or you feel faint. °· You have trouble breathing or you are short of breath. °· Your speech is slurred. °· You have trouble moving an arm or leg on one side of your body. °· Your fingers or toes turn cold or blue. °This information is not intended to replace advice given to you by your health care provider. Make sure you discuss any questions you have with your health care provider. °Document Released: 01/24/2013 Document Revised: 11/07/2015 Document Reviewed: 10/10/2015 °Elsevier Interactive Patient Education © 2018 [employer] ° °

## 2017-09-07 NOTE — CV Procedure (Signed)
   CARDIOVERSION NOTE  Procedure: Electrical Cardioversion Indications:  Atrial Fibrillation  Procedure Details:  Consent: Risks of procedure as well as the alternatives and risks of each were explained to the (patient/caregiver).  Consent for procedure obtained.  Time Out: Verified patient identification, verified procedure, site/side was marked, verified correct patient position, special equipment/implants available, medications/allergies/relevent history reviewed, required imaging and test results available.  Performed  Patient placed on cardiac monitor, pulse oximetry, supplemental oxygen as necessary.  Sedation given: Propofol per anesthesia Pacer pads placed anterior and posterior chest.  Cardioverted 3 time(s).  Cardioverted at 200J biphasic.  Impression: Findings: Post procedure EKG shows: Atrial Fibrillation Complications: None Patient did tolerate procedure well.  Plan: 1. Unsuccessful DCCV after 3 stacked 200J biphasic shocks. 2. Follow-up in a-fib clinic for consideration of alternative antiarrhythmics or possible ablation.  Time Spent Directly with the Patient:  30 minutes   Chrystie Nose, MD, [facility], FACP  [place]  [facility]  Medical Director of the Advanced Lipid Disorders &  Cardiovascular Risk Reduction Clinic Diplomate of the American Board of Clinical Lipidology Attending Cardiologist  Direct Dial: [phone]  Fax: [phone]  Website:  [email or web address]  Lisette Abu Hilty 09/07/2017, 2:21 PM

## 2017-09-07 NOTE — Progress Notes (Signed)
Per Rudi Coco NP -- since patient failed to convert pt can stop flecainide and will discuss change in therapy at follow up visit. Endo RN notified to inform pt to stop flecainide.

## 2017-09-07 NOTE — Progress Notes (Signed)
Now on 100 mg flecainide bid, for cardioversion this am. Labs drawn. EKG reviewed with [name], ok to proceed to cardioversion. afib at 66 bpm, qrs int 124 ms, qtc 509 ms

## 2017-09-07 NOTE — H&P (Signed)
   INTERVAL PROCEDURE H&P  History and Physical Interval Note:  09/07/2017 12:40 PM  Philip [name] has presented today for their planned procedure. The various methods of treatment have been discussed with the patient and family. After consideration of risks, benefits and other options for treatment, the patient has consented to the procedure.  The patients' outpatient history has been reviewed, patient examined, and no change in status from most recent office note within the past 30 days. I have reviewed the patients' chart and labs and will proceed as planned. Questions were answered to the patient's satisfaction.   Chrystie Nose, MD, [facility], FACP  [place]  [facility]  Medical Director of the Advanced Lipid Disorders &  Cardiovascular Risk Reduction Clinic Diplomate of the American Board of Clinical Lipidology Attending Cardiologist  Direct Dial: [phone]  Fax: [phone]  Website:  [email or web address] Nicely Jeanae Whitmill 09/07/2017, 12:40 PM

## 2017-09-07 NOTE — Anesthesia Postprocedure Evaluation (Signed)
Anesthesia Post Note  Patient: Philip [name]  Procedure(s) Performed: CARDIOVERSION (N/A )     Patient location during evaluation: PACU Anesthesia Type: General Level of consciousness: awake and alert Pain management: pain level controlled Vital Signs Assessment: post-procedure vital signs reviewed and stable Respiratory status: spontaneous breathing, nonlabored ventilation, respiratory function stable and patient connected to nasal cannula oxygen Cardiovascular status: blood pressure returned to baseline and stable Postop Assessment: no apparent nausea or vomiting Anesthetic complications: no    Last Vitals:  Vitals:   09/07/17 1414 09/07/17 1415  BP:    Pulse: 60 66  Resp: 17 20  Temp:    SpO2: 92% 90%    Last Pain:  Vitals:   09/07/17 1245  TempSrc: Oral  PainSc: 0-No pain                 Naysa Puskas S

## 2017-09-08 ENCOUNTER — Encounter (HOSPITAL_COMMUNITY): Payer: Self-pay | Admitting: Internal Medicine

## 2017-09-15 ENCOUNTER — Ambulatory Visit (HOSPITAL_COMMUNITY)
Admission: RE | Admit: 2017-09-15 | Discharge: 2017-09-15 | Disposition: A | Discharge status: Home / Self Care | Payer: BLUE CROSS/BLUE SHIELD | Source: Ambulatory Visit | Attending: Nurse Practitioner | Admitting: Nurse Practitioner

## 2017-09-15 ENCOUNTER — Telehealth: Payer: Self-pay | Admitting: Pharmacist

## 2017-09-15 ENCOUNTER — Encounter (HOSPITAL_COMMUNITY): Payer: Self-pay | Admitting: Nurse Practitioner

## 2017-09-15 VITALS — BP 116/78 | HR 66 | Ht 77.0 in | Wt 337.0 lb

## 2017-09-15 DIAGNOSIS — I447 Left bundle-branch block, unspecified: Secondary | ICD-10-CM | POA: Insufficient documentation

## 2017-09-15 DIAGNOSIS — Z833 Family history of diabetes mellitus: Secondary | ICD-10-CM | POA: Insufficient documentation

## 2017-09-15 DIAGNOSIS — Z9889 Other specified postprocedural states: Secondary | ICD-10-CM | POA: Insufficient documentation

## 2017-09-15 DIAGNOSIS — E1140 Type 2 diabetes mellitus with diabetic neuropathy, unspecified: Secondary | ICD-10-CM | POA: Insufficient documentation

## 2017-09-15 DIAGNOSIS — Z79899 Other long term (current) drug therapy: Secondary | ICD-10-CM | POA: Diagnosis not present

## 2017-09-15 DIAGNOSIS — G4730 Sleep apnea, unspecified: Secondary | ICD-10-CM | POA: Diagnosis not present

## 2017-09-15 DIAGNOSIS — I481 Persistent atrial fibrillation: Secondary | ICD-10-CM

## 2017-09-15 DIAGNOSIS — Z96652 Presence of left artificial knee joint: Secondary | ICD-10-CM | POA: Insufficient documentation

## 2017-09-15 DIAGNOSIS — Z8719 Personal history of other diseases of the digestive system: Secondary | ICD-10-CM | POA: Diagnosis not present

## 2017-09-15 DIAGNOSIS — E669 Obesity, unspecified: Secondary | ICD-10-CM | POA: Insufficient documentation

## 2017-09-15 DIAGNOSIS — I10 Essential (primary) hypertension: Secondary | ICD-10-CM | POA: Insufficient documentation

## 2017-09-15 DIAGNOSIS — Z6839 Body mass index (BMI) 39.0-39.9, adult: Secondary | ICD-10-CM | POA: Insufficient documentation

## 2017-09-15 DIAGNOSIS — I4581 Long QT syndrome: Secondary | ICD-10-CM | POA: Insufficient documentation

## 2017-09-15 DIAGNOSIS — Z888 Allergy status to other drugs, medicaments and biological substances status: Secondary | ICD-10-CM | POA: Diagnosis not present

## 2017-09-15 DIAGNOSIS — E11621 Type 2 diabetes mellitus with foot ulcer: Secondary | ICD-10-CM | POA: Diagnosis not present

## 2017-09-15 DIAGNOSIS — Z880 Allergy status to penicillin: Secondary | ICD-10-CM | POA: Insufficient documentation

## 2017-09-15 DIAGNOSIS — Z800 Family history of malignant neoplasm of digestive organs: Secondary | ICD-10-CM | POA: Diagnosis not present

## 2017-09-15 DIAGNOSIS — Z7952 Long term (current) use of systemic steroids: Secondary | ICD-10-CM | POA: Insufficient documentation

## 2017-09-15 DIAGNOSIS — E119 Type 2 diabetes mellitus without complications: Secondary | ICD-10-CM | POA: Diagnosis not present

## 2017-09-15 DIAGNOSIS — I4819 Other persistent atrial fibrillation: Secondary | ICD-10-CM

## 2017-09-15 DIAGNOSIS — I4891 Unspecified atrial fibrillation: Secondary | ICD-10-CM | POA: Insufficient documentation

## 2017-09-15 DIAGNOSIS — Z952 Presence of prosthetic heart valve: Secondary | ICD-10-CM | POA: Diagnosis not present

## 2017-09-15 DIAGNOSIS — Z87891 Personal history of nicotine dependence: Secondary | ICD-10-CM | POA: Diagnosis not present

## 2017-09-15 DIAGNOSIS — Z886 Allergy status to analgesic agent status: Secondary | ICD-10-CM | POA: Insufficient documentation

## 2017-09-15 DIAGNOSIS — Z820 Family history of epilepsy and other diseases of the nervous system: Secondary | ICD-10-CM | POA: Diagnosis not present

## 2017-09-15 DIAGNOSIS — K219 Gastro-esophageal reflux disease without esophagitis: Secondary | ICD-10-CM | POA: Insufficient documentation

## 2017-09-15 DIAGNOSIS — Z7984 Long term (current) use of oral hypoglycemic drugs: Secondary | ICD-10-CM | POA: Insufficient documentation

## 2017-09-15 DIAGNOSIS — Z7901 Long term (current) use of anticoagulants: Secondary | ICD-10-CM | POA: Diagnosis not present

## 2017-09-15 NOTE — Progress Notes (Signed)
Primary Care Physician: [name], MD Referring Physician: Dr. Truman Hayward Philip [name] is a 57 y.o. male with a h/o  bioprosthetic aortic valve replacement in 2005, gastric bypass in 2005, (prior weight over 500 lbs), HTN, DM, OSA treated with CPAP, in the afib clinic for evaluation for persistent afib since [facility] 2018. This was found at [facility] where pt was evaluated for a GI bleed in December. The source of bleeding was never identified.  He was placed on xarelto then and has not has a reoccurrence of bleeding. He had one cardioversion in March and it was not successful.He recently was seen by Dr. Antoine Poche with plans to start flecainide. He had a ETT, although his exercise tolerance was poor, there were no high risk findings for CAD,  but pt did not receive the message to start until late yesterday so he did not start drug as of yet.  F/u in afib clinic, 5/30.Pt went on to load on flecainide 100 mg bid and had cardioversion but would not convert. Flecainide  was stopped and he is now in the Afib clinic to discuss other antiarrythmic's.  Today, he denies symptoms of palpitations, chest pain , orthopnea, PND, lower extremity edema, dizziness, presyncope, syncope, or neurologic sequela. + for exertional dyspnea.The patient is tolerating medications without difficulties and is otherwise without complaint today.   Past Medical History:  Diagnosis Date  . Arthritis   . Dental crowns present   . Diabetes mellitus    NIDDM  . Diabetic foot ulcer (HCC) 08/2011   right foot  . Dysrhythmia   . Fatty liver   . Gastric ulcer    no current problems  . GERD (gastroesophageal reflux disease)   . Gout   . 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 History of bronchitis as a child   . 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 Hx of bacterial endocarditis 11/2003  . Hypertension    under control; states med. is really to protect  kidneys due to diabetes  . Neuropathy    feet bilat   . Obesity   . Pneumonia    history of as child   . Sleep apnea    uses CPAP nightly  . 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 Toe deformity 08/2011   right claw hallux   Past Surgical History:  Procedure Laterality Date  . adenoid surgery     . AORTIC VALVE REPLACEMENT  11/2003  . CARDIAC CATHETERIZATION    . CARDIOVERSION N/A 07/25/2017   Procedure: CARDIOVERSION;  Surgeon: Chilton Si, MD;  Location: [facility] ENDOSCOPY;  Service: Cardiovascular;  Laterality: N/A;  . CARDIOVERSION N/A 09/07/2017   Procedure: CARDIOVERSION;  Surgeon: Chrystie Nose, MD;  Location: [facility] ENDOSCOPY;  Service: Cardiovascular;  Laterality: N/A;  . COLONOSCOPY WITH PROPOFOL N/A 03/30/2017   Procedure: COLONOSCOPY WITH PROPOFOL;  Surgeon: Benancio Deeds, MD;  Location: [facility] ENDOSCOPY;  Service: Gastroenterology;  Laterality: N/A;  . ESOPHAGOGASTRODUODENOSCOPY (EGD) WITH PROPOFOL N/A 03/29/2017   Procedure: ESOPHAGOGASTRODUODENOSCOPY (EGD) WITH PROPOFOL;  Surgeon: Benancio Deeds, MD;  Location: [facility] ENDOSCOPY;  Service: Gastroenterology;  Laterality: N/A;  . GASTRIC BYPASS  03/2004  . GIVENS CAPSULE STUDY N/A 03/30/2017   Procedure: GIVENS CAPSULE STUDY;  Surgeon: Benancio Deeds, MD;  Location: [facility] ENDOSCOPY;  Service: Gastroenterology;  Laterality: N/A;  . KNEE ARTHROSCOPY  08/09/2007 - left   07/15/2003 - right  .  METATARSAL OSTEOTOMY  03/20/2010   right 1st MT; gastroc soleus recession  . NASAL SINUS SURGERY    . TENDON RELEASE  09/09/2011   Procedure: HEEL CORD LENGTHENING;  Surgeon: Toni Arthurs, MD;  Location: [facility];  Service: Orthopedics;  Laterality: Right;  Righ tachilles tendon lengthening  . TOE FUSION  07/12/2005   left 3rd toe PIP and DIP fusion  . TONSILLECTOMY AND ADENOIDECTOMY    . TOTAL KNEE ARTHROPLASTY Left 03/18/2015   Procedure: LEFT TOTAL KNEE ARTHROPLASTY;  Surgeon: Durene Romans, MD;  Location: [facility] ORS;   Service: Orthopedics;  Laterality: Left;    Current Outpatient Medications  Medication Sig Dispense Refill  . Ascorbic Acid (VITAMIN C) 1000 MG tablet Take 1,000 mg by mouth daily.    . B Complex-C (SUPER B COMPLEX PO) Take 1 tablet by mouth daily.     . Calcium Carb-Cholecalciferol (CALCIUM 600/VITAMIN D3 PO) Take 1 tablet by mouth 2 (two) times daily.    . cetirizine (ZYRTEC ALLERGY) 10 MG tablet Take 10 mg by mouth daily.     Marland Kitchen esomeprazole (NEXIUM) 40 MG capsule Take 40 mg by mouth every evening.     . Ferrous Sulfate (IRON) 325 (65 Fe) MG TABS Take 325 mg by mouth daily.     . furosemide (LASIX) 40 MG tablet Take 1 tablet (40 mg total) by mouth daily. 30 tablet 6  . losartan (COZAAR) 25 MG tablet Take 25 mg by mouth every evening.     . Melatonin 3 MG CAPS Take 3 mg by mouth at bedtime.     . metolazone (ZAROXOLYN) 5 MG tablet Take 1 tablet (5 mg total) by mouth once a week. Saturdays (Patient taking differently: Take 5 mg by mouth once a week. ) 12 tablet 3  . metoprolol succinate (TOPROL-XL) 50 MG 24 hr tablet Take 1 tablet (50 mg total) by mouth daily. Take with or immediately following a meal. (Patient taking differently: Take 50 mg by mouth daily with supper. Take with or immediately following a meal.) 90 tablet 3  . NON FORMULARY Apply 1 application topically daily. Triamcinolone-Eucerin Compound Cream    . potassium chloride SA (K-DUR,KLOR-CON) 20 MEQ tablet Take 1 tablet (20 mEq total) by mouth daily. 90 tablet 2  . rivaroxaban (XARELTO) 20 MG TABS tablet Take 1 tablet (20 mg total) by mouth daily with supper. 90 tablet 1  . sertraline (ZOLOFT) 100 MG tablet Take 100 mg by mouth daily.    . simvastatin (ZOCOR) 10 MG tablet Take 10 mg by mouth every evening.     . sitaGLIPtin-metformin (JANUMET) 50-1000 MG tablet Take 1 tablet by mouth 2 (two) times daily with a meal.     . traZODone (DESYREL) 50 MG tablet Take 50 mg by mouth at bedtime.      No current facility-administered  medications for this encounter.     Allergies  Allergen Reactions  . Nsaids Swelling and Other (See Comments)    NO REACTION LISTED, LIPS AND NECK  . Penicillins Hives, Swelling and Other (See Comments)    NO REACTION LISTED Has patient had a PCN reaction causing immediate rash, facial/tongue/throat swelling, SOB or lightheadedness with hypotension: Yes Has patient had a PCN reaction causing severe rash involving mucus membranes or skin necrosis: No Has patient had a PCN reaction that required hospitalization No Has patient had a PCN reaction occurring within the last 10 years: No If all of the above answers are "NO", then may proceed  with Cephalosporin use.  Has taken Keflex w/o issue      . Other Other (See Comments)    CYCLOOXYGENASE INHIBITORS-NO REACTION LISTED.    Social History   Socioeconomic History  . Marital status: Married    Spouse name: Not on file  . Number of children: Not on file  . Years of education: Not on file  . Highest education level: Not on file  Occupational History  . Not on file  Social Needs  . Financial resource strain: Not on file  . Food insecurity:    Worry: Not on file    Inability: Not on file  . Transportation needs:    Medical: Not on file    Non-medical: Not on file  Tobacco Use  . Smoking status: Former Smoker    Packs/day: 1.00    Years: 2.00    Pack years: 2.00    Types: Cigars, Pipe, Cigarettes    Last attempt to quit: 01/31/2011    Years since quitting: 6.6  . Smokeless tobacco: Never Used  . Tobacco comment: smokes 1-2 cigars/week  Substance and Sexual Activity  . Alcohol use: Not Currently    Alcohol/week: 0.0 oz  . Drug use: No  . Sexual activity: Not on file  Lifestyle  . Physical activity:    Days per week: Not on file    Minutes per session: Not on file  . Stress: Not on file  Relationships  . Social connections:    Talks on phone: Not on file    Gets together: Not on file    Attends religious service:  Not on file    Active member of club or organization: Not on file    Attends meetings of clubs or organizations: Not on file    Relationship status: Not on file  . Intimate partner violence:    Fear of current or ex partner: Not on file    Emotionally abused: Not on file    Physically abused: Not on file    Forced sexual activity: Not on file  Other Topics Concern  . Not on file  Social History Narrative  . Not on file    Family History  Problem Relation Age of Onset  . Alzheimer's disease Mother   . Diabetes Father   . Colon cancer Unknown        family history    ROS- All systems are reviewed and negative except as per the HPI above  Physical Exam: Vitals:   09/15/17 0839  BP: 116/78  Pulse: 66  Weight: (!) 337 lb (152.9 kg)  Height:  (1.956 m)   Wt Readings from Last 3 Encounters:  09/15/17 (!) 337 lb (152.9 kg)  09/07/17 (!) 331 lb (150.1 kg)  08/30/17 (!) 341 lb 12.8 oz (155 kg)    Labs: Lab Results  Component Value Date   NA 141 09/07/2017   K 3.5 09/07/2017   CL 102 09/07/2017   CO2 30 09/07/2017   GLUCOSE 124 (H) 09/07/2017   BUN 26 (H) 09/07/2017   CREATININE 1.24 09/07/2017   CALCIUM 9.3 09/07/2017   Lab Results  Component Value Date   INR 1.3 (H) 07/14/2017   No results found for: CHOL, HDL, LDLCALC, TRIG   GEN- The patient is well appearing, alert and oriented x 3 today.   Head- normocephalic, atraumatic Eyes-  Sclera clear, conjunctiva pink Ears- hearing intact Oropharynx- clear Neck- supple, no JVP Lymph- no cervical lymphadenopathy Lungs- Clear to ausculation bilaterally,  normal work of breathing Heart- irregular rate and rhythm, no murmurs, rubs or gallops, PMI not laterally displaced GI- soft, NT, ND, + BS Extremities- no clubbing, cyanosis, or edema MS- no significant deformity or atrophy Skin- no rash or lesion Psych- euthymic mood, full affect Neuro- strength and sensation are intact  EKG-afib at 66 bpm, qrs int 108  ms, qtc 463 ms    Assessment and Plan: 1.Persisitent  afib Failed flecainide with cardioversion, he is off flecainide  Pt is not drinking any alcohol, or using tobacco, or excessive caffeine He is religiously using cpap He will continue metoprolol  No missed doses of xarelto for at least 3 weeks, CHA2DS2VASc score is at least  2 I believe his best options at this point are Tikosyn or sotalol, if he could get his weight less than 300 lbs, he could be considered for ablation, he is working toward this goal and currently following weight watchers He is not using any benadryl  Qtc is 463 but 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 Matthew Folks [facility] [address] [place], [place] [address] [phone]

## 2017-09-15 NOTE — Telephone Encounter (Signed)
Medication list reviewed in anticipation of upcoming Tikosyn initiation. Patient is taking multiple QTc prolonging medications, including metolazone, sertraline, and trazodone. Would see if pt is able to stop metolazone (currently listed as taking once a week) and trazodone (on a lower dose and likely using as a sleep aid). Would be ok to remain on sertraline but do want to avoid concomitant use of multiple QTc prolonging medications if possible.  Patient is anticoagulated on Xarelto  daily on the appropriate dose. Please ensure that patient has not missed any anticoagulation doses in the 3 weeks prior to Tikosyn initiation.   Patient will need to be counseled to avoid use of Benadryl while on Tikosyn and in the 2-3 days prior to Tikosyn initiation.

## 2017-09-16 NOTE — Telephone Encounter (Signed)
I discussed Philip [name]'s comments with pt, he will stop metolazone and trazodone. No benadryl use.He is also trying to wean off sertraline.

## 2017-09-21 ENCOUNTER — Other Ambulatory Visit (HOSPITAL_COMMUNITY): Payer: Self-pay | Admitting: *Deleted

## 2017-09-21 MED ORDER — POTASSIUM CHLORIDE CRYS ER 20 MEQ PO TBCR
20.0000 meq | EXTENDED_RELEASE_TABLET | Freq: Every day | ORAL | 2 refills | Status: DC
Start: 2017-09-21 — End: 2018-05-17

## 2017-09-27 ENCOUNTER — Encounter (HOSPITAL_COMMUNITY): Payer: Self-pay | Admitting: Nurse Practitioner

## 2017-09-27 ENCOUNTER — Other Ambulatory Visit: Payer: Self-pay

## 2017-09-27 ENCOUNTER — Encounter (HOSPITAL_COMMUNITY): Payer: Self-pay

## 2017-09-27 ENCOUNTER — Inpatient Hospital Stay (HOSPITAL_COMMUNITY)
Admission: RE | Admit: 2017-09-27 | Discharge: 2017-09-30 | DRG: 310 | Disposition: A | Discharge status: Home / Self Care | Payer: BLUE CROSS/BLUE SHIELD | Source: Ambulatory Visit | Attending: Internal Medicine | Admitting: Internal Medicine

## 2017-09-27 ENCOUNTER — Ambulatory Visit (HOSPITAL_COMMUNITY)
Admission: RE | Admit: 2017-09-27 | Discharge: 2017-09-27 | Disposition: A | Discharge status: Home / Self Care | Payer: BLUE CROSS/BLUE SHIELD | Source: Ambulatory Visit | Attending: Nurse Practitioner | Admitting: Nurse Practitioner

## 2017-09-27 VITALS — BP 112/72 | HR 86 | Ht 77.0 in | Wt 328.0 lb

## 2017-09-27 DIAGNOSIS — Z5181 Encounter for therapeutic drug level monitoring: Secondary | ICD-10-CM | POA: Diagnosis not present

## 2017-09-27 DIAGNOSIS — M1990 Unspecified osteoarthritis, unspecified site: Secondary | ICD-10-CM | POA: Diagnosis present

## 2017-09-27 DIAGNOSIS — I4581 Long QT syndrome: Secondary | ICD-10-CM | POA: Diagnosis present

## 2017-09-27 DIAGNOSIS — Z888 Allergy status to other drugs, medicaments and biological substances status: Secondary | ICD-10-CM | POA: Diagnosis not present

## 2017-09-27 DIAGNOSIS — Z954 Presence of other heart-valve replacement: Secondary | ICD-10-CM

## 2017-09-27 DIAGNOSIS — Z8679 Personal history of other diseases of the circulatory system: Secondary | ICD-10-CM | POA: Diagnosis not present

## 2017-09-27 DIAGNOSIS — I4819 Other persistent atrial fibrillation: Secondary | ICD-10-CM

## 2017-09-27 DIAGNOSIS — Z8711 Personal history of peptic ulcer disease: Secondary | ICD-10-CM

## 2017-09-27 DIAGNOSIS — I481 Persistent atrial fibrillation: Principal | ICD-10-CM | POA: Diagnosis present

## 2017-09-27 DIAGNOSIS — K760 Fatty (change of) liver, not elsewhere classified: Secondary | ICD-10-CM | POA: Diagnosis present

## 2017-09-27 DIAGNOSIS — Z9884 Bariatric surgery status: Secondary | ICD-10-CM | POA: Diagnosis not present

## 2017-09-27 DIAGNOSIS — E1151 Type 2 diabetes mellitus with diabetic peripheral angiopathy without gangrene: Secondary | ICD-10-CM | POA: Diagnosis present

## 2017-09-27 DIAGNOSIS — Z96698 Presence of other orthopedic joint implants: Secondary | ICD-10-CM | POA: Diagnosis present

## 2017-09-27 DIAGNOSIS — Z96652 Presence of left artificial knee joint: Secondary | ICD-10-CM | POA: Diagnosis present

## 2017-09-27 DIAGNOSIS — G4733 Obstructive sleep apnea (adult) (pediatric): Secondary | ICD-10-CM

## 2017-09-27 DIAGNOSIS — Z953 Presence of xenogenic heart valve: Secondary | ICD-10-CM

## 2017-09-27 DIAGNOSIS — K219 Gastro-esophageal reflux disease without esophagitis: Secondary | ICD-10-CM | POA: Diagnosis present

## 2017-09-27 DIAGNOSIS — Z886 Allergy status to analgesic agent status: Secondary | ICD-10-CM

## 2017-09-27 DIAGNOSIS — E1140 Type 2 diabetes mellitus with diabetic neuropathy, unspecified: Secondary | ICD-10-CM | POA: Diagnosis present

## 2017-09-27 DIAGNOSIS — Z87891 Personal history of nicotine dependence: Secondary | ICD-10-CM

## 2017-09-27 DIAGNOSIS — Z7901 Long term (current) use of anticoagulants: Secondary | ICD-10-CM | POA: Diagnosis not present

## 2017-09-27 DIAGNOSIS — M109 Gout, unspecified: Secondary | ICD-10-CM | POA: Diagnosis present

## 2017-09-27 DIAGNOSIS — Z79899 Other long term (current) drug therapy: Secondary | ICD-10-CM

## 2017-09-27 DIAGNOSIS — Z6838 Body mass index (BMI) 38.0-38.9, adult: Secondary | ICD-10-CM | POA: Diagnosis not present

## 2017-09-27 DIAGNOSIS — Z9989 Dependence on other enabling machines and devices: Secondary | ICD-10-CM

## 2017-09-27 DIAGNOSIS — I10 Essential (primary) hypertension: Secondary | ICD-10-CM | POA: Diagnosis present

## 2017-09-27 DIAGNOSIS — Z952 Presence of prosthetic heart valve: Secondary | ICD-10-CM | POA: Diagnosis not present

## 2017-09-27 HISTORY — DX: Other persistent atrial fibrillation: I48.19

## 2017-09-27 LAB — BASIC METABOLIC PANEL
ANION GAP: 7 (ref 5–15)
BUN: 18 mg/dL (ref 6–20)
CHLORIDE: 104 mmol/L (ref 101–111)
CO2: 30 mmol/L (ref 22–32)
Calcium: 9.4 mg/dL (ref 8.9–10.3)
Creatinine, Ser: 1.07 mg/dL (ref 0.61–1.24)
GFR calc non Af Amer: >60 mL/min (ref >60)
GLUCOSE: 126 mg/dL — AB (ref 65–99)
POTASSIUM: 3.9 mmol/L (ref 3.5–5.1)
Sodium: 141 mmol/L (ref 135–145)

## 2017-09-27 LAB — MAGNESIUM: Magnesium: 1.9 mg/dL (ref 1.7–2.4)

## 2017-09-27 LAB — GLUCOSE, CAPILLARY
GLUCOSE-CAPILLARY: 103 mg/dL — AB (ref 65–99)
GLUCOSE-CAPILLARY: 163 mg/dL — AB (ref 65–99)
GLUCOSE-CAPILLARY: 123 mg/dL — AB (ref 65–99)

## 2017-09-27 MED ORDER — LINAGLIPTIN 5 MG PO TABS
5.0000 mg | ORAL_TABLET | Freq: Every day | ORAL | Status: DC
  Administered 2017-09-28 – 2017-09-30 (×3): 5 mg via ORAL
  Filled 2017-09-27 (×3): qty 1

## 2017-09-27 MED ORDER — SODIUM CHLORIDE 0.9% FLUSH: 3.0000 mL | INTRAVENOUS | Status: DC | PRN

## 2017-09-27 MED ORDER — NON FORMULARY: 10.0000 mg | Freq: Every day | Status: DC

## 2017-09-27 MED ORDER — PANTOPRAZOLE SODIUM 40 MG PO TBEC
80.0000 mg | DELAYED_RELEASE_TABLET | Freq: Every day | ORAL | Status: DC
  Administered 2017-09-27 – 2017-09-30 (×4): 80 mg via ORAL
  Filled 2017-09-27 (×4): qty 2

## 2017-09-27 MED ORDER — LINAGLIPTIN 5 MG PO TABS: 5.0000 mg | ORAL_TABLET | Freq: Every day | ORAL | Status: DC

## 2017-09-27 MED ORDER — MAGNESIUM SULFATE IN D5W 1-5 GM/100ML-% IV SOLN
1.0000 g | Freq: Once | INTRAVENOUS | Status: AC
  Administered 2017-09-27: 1 g via INTRAVENOUS
  Filled 2017-09-27: qty 100

## 2017-09-27 MED ORDER — POTASSIUM CHLORIDE CRYS ER 20 MEQ PO TBCR
30.0000 meq | EXTENDED_RELEASE_TABLET | Freq: Once | ORAL | Status: AC
  Administered 2017-09-27: 30 meq via ORAL
  Filled 2017-09-27: qty 1

## 2017-09-27 MED ORDER — FUROSEMIDE 40 MG PO TABS
40.0000 mg | ORAL_TABLET | Freq: Every day | ORAL | Status: DC
  Administered 2017-09-28 – 2017-09-30 (×3): 40 mg via ORAL
  Filled 2017-09-27 (×3): qty 1

## 2017-09-27 MED ORDER — LOSARTAN POTASSIUM 25 MG PO TABS
25.0000 mg | ORAL_TABLET | Freq: Every evening | ORAL | Status: DC
  Administered 2017-09-27 – 2017-09-29 (×3): 25 mg via ORAL
  Filled 2017-09-27 (×3): qty 1

## 2017-09-27 MED ORDER — SODIUM CHLORIDE 0.9 % IV SOLN
250.0000 mL | INTRAVENOUS | Status: DC | PRN
  Administered 2017-09-29: 13:00:00 via INTRAVENOUS

## 2017-09-27 MED ORDER — METOPROLOL SUCCINATE ER 50 MG PO TB24
50.0000 mg | ORAL_TABLET | Freq: Every day | ORAL | Status: DC
  Administered 2017-09-27 – 2017-09-29 (×3): 50 mg via ORAL
  Filled 2017-09-27 (×3): qty 1

## 2017-09-27 MED ORDER — FERROUS SULFATE 325 (65 FE) MG PO TABS
325.0000 mg | ORAL_TABLET | Freq: Every day | ORAL | Status: DC
  Administered 2017-09-28 – 2017-09-30 (×3): 325 mg via ORAL
  Filled 2017-09-27 (×6): qty 1

## 2017-09-27 MED ORDER — LORATADINE 10 MG PO TABS: 10.0000 mg | ORAL_TABLET | Freq: Every day | ORAL | Status: DC

## 2017-09-27 MED ORDER — SODIUM CHLORIDE 0.9% FLUSH
3.0000 mL | Freq: Two times a day (BID) | INTRAVENOUS | Status: DC
  Administered 2017-09-27 – 2017-09-28 (×4): 3 mL via INTRAVENOUS

## 2017-09-27 MED ORDER — DOFETILIDE 500 MCG PO CAPS
500.0000 ug | ORAL_CAPSULE | Freq: Two times a day (BID) | ORAL | Status: DC
  Administered 2017-09-27 – 2017-09-28 (×2): 500 ug via ORAL
  Filled 2017-09-27 (×2): qty 1

## 2017-09-27 MED ORDER — RIVAROXABAN 20 MG PO TABS
20.0000 mg | ORAL_TABLET | Freq: Every day | ORAL | Status: DC
  Administered 2017-09-27 – 2017-09-29 (×3): 20 mg via ORAL
  Filled 2017-09-27 (×4): qty 1

## 2017-09-27 MED ORDER — POTASSIUM CHLORIDE CRYS ER 20 MEQ PO TBCR: 20.0000 meq | EXTENDED_RELEASE_TABLET | Freq: Every day | ORAL | Status: DC

## 2017-09-27 MED ORDER — SITAGLIPTIN PHOS-METFORMIN HCL 50-1000 MG PO TABS: 1.0000 | ORAL_TABLET | Freq: Two times a day (BID) | ORAL | Status: DC

## 2017-09-27 MED ORDER — MELATONIN 3 MG PO TABS
3.0000 mg | ORAL_TABLET | Freq: Every day | ORAL | Status: DC
Start: 2017-09-27 — End: 2017-09-30
  Administered 2017-09-27 – 2017-09-29 (×3): 3 mg via ORAL
  Filled 2017-09-27 (×3): qty 1

## 2017-09-27 MED ORDER — VITAMIN C 500 MG PO TABS
1000.0000 mg | ORAL_TABLET | Freq: Every day | ORAL | Status: DC
  Administered 2017-09-28 – 2017-09-30 (×3): 1000 mg via ORAL
  Filled 2017-09-27 (×3): qty 2

## 2017-09-27 MED ORDER — SIMVASTATIN 10 MG PO TABS
10.0000 mg | ORAL_TABLET | Freq: Every evening | ORAL | Status: DC
  Administered 2017-09-27 – 2017-09-29 (×3): 10 mg via ORAL
  Filled 2017-09-27 (×3): qty 1

## 2017-09-27 MED ORDER — METFORMIN HCL 500 MG PO TABS
1000.0000 mg | ORAL_TABLET | Freq: Two times a day (BID) | ORAL | Status: DC
  Administered 2017-09-27 – 2017-09-30 (×5): 1000 mg via ORAL
  Filled 2017-09-27 (×6): qty 2

## 2017-09-27 MED ORDER — POTASSIUM CHLORIDE CRYS ER 20 MEQ PO TBCR
20.0000 meq | EXTENDED_RELEASE_TABLET | Freq: Every day | ORAL | Status: DC
  Administered 2017-09-28 – 2017-09-30 (×3): 20 meq via ORAL
  Filled 2017-09-27 (×3): qty 1

## 2017-09-27 MED ORDER — CETIRIZINE HCL 10 MG PO TABS
10.0000 mg | ORAL_TABLET | Freq: Every day | ORAL | Status: DC
  Administered 2017-09-28 – 2017-09-30 (×3): 10 mg via ORAL
  Filled 2017-09-27 (×4): qty 1

## 2017-09-27 NOTE — Care Management Note (Addendum)
Case Management Note  Patient Details  Name: Trenton Gammonlan M Leggitt MRN: [id number] Date of Birth: 1960/11/29  Subjective/Objective:   Persistent afib, Tikosyn initiation                  Action/Plan: Will need a Rx for 7 day supply of Tikosyn to [facility]. Pt will dc with a 7 day supply. Message left for attending to complete Prior auth for Tikosyn. NCM spoke to pt and states Tikosyn -generic 90 day supply with [facility] is $[facility]. [facility] [address] and [facility] Order are listed on pharmacy profile.       Co-pay for tikosyn 500 mcg bid $100.00 30 day supply.  PA required @ [phone]  [facility]     Expected Discharge Date:  10/01/17               Expected Discharge Plan:  Home/Self Care  In-House Referral:  NA  Discharge planning Services  CM Consult, Medication Assistance  Post Acute Care Choice:  NA Choice offered to:  NA  DME Arranged:  N/A DME Agency:  NA  HH Arranged:  NA HH Agency:  NA  Status of Service:  Completed, signed off  If discussed at Long Length of Stay Meetings, dates discussed:    Additional Comments:  Elliot CousinShavis, Sidnie Swalley Ellen, RN 09/27/2017, 4:55 PM

## 2017-09-27 NOTE — Progress Notes (Signed)
Primary Care Physician: [name], MD Primary Cardiologist: Antoine Poche Primary Electrophysiologist: Celso Amy is a 57 y.o. male with a history of persistent atrial fibrillation who presents for follow up in the [facility].  Since last being seen in clinic, the patient reports doing reasonably well.  He has persistent fatigue and occasional dizziness. He has failed Flecainide. Plan is for Tikosyn admission.   Today, he  denies symptoms of palpitations, chest pain, shortness of breath, orthopnea, PND, lower extremity edema, presyncope, syncope, snoring, daytime somnolence, bleeding, or neurologic sequela. The patient is tolerating medications without difficulties and is otherwise without complaint today. He continues to diligently work on weight loss.    Atrial Fibrillation Risk Factors:  he does have symptoms or diagnosis of sleep apnea. he is compliant with CPAP therapy.  he does not have a history of rheumatic fever.   he has a BMI of Body mass index is 38.9 kg/m.Marland Kitchen Filed Weights   09/27/17 0857  Weight: (!) 328 lb (148.8 kg)    LA size: 46mm   Atrial Fibrillation Management history:  Previous antiarrhythmic drugs: Flecainide  Previous cardioversions: 07/2017, 08/2017   Previous ablations: none  CHADS2VASC score: 2  Anticoagulation history: Xarelto   Past Medical History:  Diagnosis Date  . Arthritis   . Dental crowns present   . Diabetes mellitus    NIDDM  . Diabetic foot ulcer (HCC) 08/2011   right foot  . Fatty liver   . Gastric ulcer    no current problems  . GERD (gastroesophageal reflux disease)   . Gout   . 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 History of bronchitis as a child   . 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 Hx of bacterial endocarditis 11/2003  . Hypertension    under control; states med. is really to protect kidneys due to diabetes  .  Neuropathy    feet bilat   . Obesity   . Persistent atrial fibrillation (HCC)   . Pneumonia    history of as child   . Sleep apnea    uses CPAP nightly  . 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 Toe deformity 08/2011   right claw hallux   Past Surgical History:  Procedure Laterality Date  . adenoid surgery     . AORTIC VALVE REPLACEMENT  11/2003  . CARDIAC CATHETERIZATION    . CARDIOVERSION N/A 07/25/2017   Procedure: CARDIOVERSION;  Surgeon: Chilton Si, MD;  Location: [facility] ENDOSCOPY;  Service: Cardiovascular;  Laterality: N/A;  . CARDIOVERSION N/A 09/07/2017   Procedure: CARDIOVERSION;  Surgeon: Chrystie Nose, MD;  Location: [facility] ENDOSCOPY;  Service: Cardiovascular;  Laterality: N/A;  . COLONOSCOPY WITH PROPOFOL N/A 03/30/2017   Procedure: COLONOSCOPY WITH PROPOFOL;  Surgeon: Benancio Deeds, MD;  Location: [facility] ENDOSCOPY;  Service: Gastroenterology;  Laterality: N/A;  . ESOPHAGOGASTRODUODENOSCOPY (EGD) WITH PROPOFOL N/A 03/29/2017   Procedure: ESOPHAGOGASTRODUODENOSCOPY (EGD) WITH PROPOFOL;  Surgeon: Benancio Deeds, MD;  Location: [facility] ENDOSCOPY;  Service: Gastroenterology;  Laterality: N/A;  . GASTRIC BYPASS  03/2004  . GIVENS CAPSULE STUDY N/A 03/30/2017   Procedure: GIVENS CAPSULE STUDY;  Surgeon: Benancio Deeds, MD;  Location: [facility] ENDOSCOPY;  Service: Gastroenterology;  Laterality: N/A;  . KNEE ARTHROSCOPY  08/09/2007 - left   07/15/2003 - right  . METATARSAL OSTEOTOMY  03/20/2010   right 1st MT; gastroc soleus recession  . NASAL  SINUS SURGERY    . TENDON RELEASE  09/09/2011   Procedure: HEEL CORD LENGTHENING;  Surgeon: Toni Arthurs, MD;  Location: [facility];  Service: Orthopedics;  Laterality: Right;  Righ tachilles tendon lengthening  . TOE FUSION  07/12/2005   left 3rd toe PIP and DIP fusion  . TONSILLECTOMY AND ADENOIDECTOMY    . TOTAL KNEE ARTHROPLASTY Left 03/18/2015   Procedure: LEFT TOTAL KNEE ARTHROPLASTY;  Surgeon: Durene Romans, MD;   Location: [facility] ORS;  Service: Orthopedics;  Laterality: Left;    Current Outpatient Medications  Medication Sig Dispense Refill  . Ascorbic Acid (VITAMIN C) 1000 MG tablet Take 1,000 mg by mouth daily.    . B Complex-C (SUPER B COMPLEX PO) Take 1 tablet by mouth daily.     . Calcium Carb-Cholecalciferol (CALCIUM 600/VITAMIN D3 PO) Take 1 tablet by mouth 2 (two) times daily.    . cetirizine (ZYRTEC ALLERGY) 10 MG tablet Take 10 mg by mouth daily.     Marland Kitchen esomeprazole (NEXIUM) 40 MG capsule Take 40 mg by mouth every evening.     . Ferrous Sulfate (IRON) 325 (65 Fe) MG TABS Take 325 mg by mouth daily.     . furosemide (LASIX) 40 MG tablet Take 1 tablet (40 mg total) by mouth daily. 30 tablet 6  . losartan (COZAAR) 25 MG tablet Take 25 mg by mouth every evening.     . Melatonin 3 MG CAPS Take 3 mg by mouth at bedtime.     . metolazone (ZAROXOLYN) 5 MG tablet Take 1 tablet (5 mg total) by mouth once a week. Saturdays (Patient taking differently: Take 5 mg by mouth once a week. ) 12 tablet 3  . metoprolol succinate (TOPROL-XL) 50 MG 24 hr tablet Take 1 tablet (50 mg total) by mouth daily. Take with or immediately following a meal. (Patient taking differently: Take 50 mg by mouth daily with supper. Take with or immediately following a meal.) 90 tablet 3  . NON FORMULARY Apply 1 application topically daily. Triamcinolone-Eucerin Compound Cream    . potassium chloride SA (K-DUR,KLOR-CON) 20 MEQ tablet Take 1 tablet (20 mEq total) by mouth daily. 90 tablet 2  . rivaroxaban (XARELTO) 20 MG TABS tablet Take 1 tablet (20 mg total) by mouth daily with supper. 90 tablet 1  . simvastatin (ZOCOR) 10 MG tablet Take 10 mg by mouth every evening.     . sitaGLIPtin-metformin (JANUMET) 50-1000 MG tablet Take 1 tablet by mouth 2 (two) times daily with a meal.      No current facility-administered medications for this encounter.     Allergies  Allergen Reactions  . Nsaids Swelling and Other (See Comments)    NO  REACTION LISTED, LIPS AND NECK  . Penicillins Hives, Swelling and Other (See Comments)    NO REACTION LISTED Has patient had a PCN reaction causing immediate rash, facial/tongue/throat swelling, SOB or lightheadedness with hypotension: Yes Has patient had a PCN reaction causing severe rash involving mucus membranes or skin necrosis: No Has patient had a PCN reaction that required hospitalization No Has patient had a PCN reaction occurring within the last 10 years: No If all of the above answers are "NO", then may proceed with Cephalosporin use.  Has taken Keflex w/o issue      . Other Other (See Comments)    CYCLOOXYGENASE INHIBITORS-NO REACTION LISTED.    Social History   Socioeconomic History  . Marital status: Married    Spouse name: Not on  file  . Number of children: Not on file  . Years of education: Not on file  . Highest education level: Not on file  Occupational History  . Not on file  Social Needs  . Financial resource strain: Not on file  . Food insecurity:    Worry: Not on file    Inability: Not on file  . Transportation needs:    Medical: Not on file    Non-medical: Not on file  Tobacco Use  . Smoking status: Former Smoker    Packs/day: 1.00    Years: 2.00    Pack years: 2.00    Types: Cigars, Pipe, Cigarettes    Last attempt to quit: 01/31/2011    Years since quitting: 6.6  . Smokeless tobacco: Never Used  . Tobacco comment: smokes 1-2 cigars/week  Substance and Sexual Activity  . Alcohol use: Not Currently    Alcohol/week: 0.0 oz  . Drug use: No  . Sexual activity: Not on file  Lifestyle  . Physical activity:    Days per week: Not on file    Minutes per session: Not on file  . Stress: Not on file  Relationships  . Social connections:    Talks on phone: Not on file    Gets together: Not on file    Attends religious service: Not on file    Active member of club or organization: Not on file    Attends meetings of clubs or organizations: Not on  file    Relationship status: Not on file  . Intimate partner violence:    Fear of current or ex partner: Not on file    Emotionally abused: Not on file    Physically abused: Not on file    Forced sexual activity: Not on file  Other Topics Concern  . Not on file  Social History Narrative  . Not on file    Family History  Problem Relation Age of Onset  . Alzheimer's disease Mother   . Diabetes Father   . Colon cancer Unknown        family history    ROS- All systems are reviewed and negative except as per the HPI above.  Physical Exam: Vitals:   09/27/17 0857  BP: 112/72  Pulse: 86  Weight: (!) 328 lb (148.8 kg)  Height: 6\' 5"  (1.956 m)    GEN- The patient is obese appearing, alert and oriented x 3 today.   Head- normocephalic, atraumatic Eyes-  Sclera clear, conjunctiva pink Ears- hearing intact Oropharynx- clear Neck- supple  Lungs- Clear to ausculation bilaterally, normal work of breathing Heart- Irregular rate and rhythm  GI- soft, NT, ND, + BS Extremities- no clubbing, cyanosis, or edema MS- no significant deformity or atrophy Skin- no rash or lesion Psych- euthymic mood, full affect Neuro- strength and sensation are intact  Wt Readings from Last 3 Encounters:  09/27/17 (!) 328 lb (148.8 kg)  09/15/17 (!) 337 lb (152.9 kg)  09/07/17 (!) 331 lb (150.1 kg)    EKG today demonstrates atrial fibrillation, rate 86, QTc manually  Epic records are reviewed at length today  Assessment and Plan:  1. Persistent atrial fibrillation He has failed Flecainide Plan for tikosyn load today He reports compliance with Xarelto with no missed doses CHADS2VASC is 2 BMET, Mg QTc manually today  2. Morbid obesity Body mass index is 38.9 kg/m. He is working on lifestyle and is doing [employer] He is motivated to lose weight  3. Obstructive  sleep apnea The importance of adequate treatment of sleep apnea was discussed today in order to improve our  ability to maintain sinus rhythm long term. The patient reports compliance with CPAP   4. S/p bioprosthetic AVR Stable by echo 12/2016 Prior endocarditis    Gypsy BalsamAmber Seiler, NP 09/27/2017 10:06 AM

## 2017-09-27 NOTE — Discharge Instructions (Addendum)
You have an appointment set up with the Atrial Fibrillation Clinic.  Multiple studies have shown that being followed by a dedicated atrial fibrillation clinic in addition to the standard care you receive from your other physicians improves health. We believe that enrollment in the atrial fibrillation clinic will allow us to better care for you.  ° °The phone number to the Atrial Fibrillation Clinic is [phone]. The clinic is staffed Monday through Friday from 8:30am to 5pm. ° °Parking Directions: The clinic is located in the Heart and Vascular Building connected to [facility]. °1)From [address] turn on to [address] and go to the 3rd entrance  (Heart and Vascular entrance) on the right. °2)Look to the right for Heart &Vascular Parking Garage. °3)A code for the entrance is required please call the clinic to receive this.   °4)Take the elevators to the 1st floor. Registration is in the room with the glass walls at the end of the hallway. ° °If you have any trouble parking or locating the clinic, please don’t hesitate to call [phone]. ° °Information on my medicine - XARELTO® (Rivaroxaban) ° °Why was Xarelto® prescribed for you? °Xarelto® was prescribed for you to reduce the risk of a blood clot forming that can cause a stroke if you have a medical condition called atrial fibrillation (a type of irregular heartbeat). ° °What do you need to know about xarelto® ? °Take your Xarelto® ONCE DAILY at the same time every day with your evening meal. °If you have difficulty swallowing the tablet whole, you may crush it and mix in applesauce just prior to taking your dose. ° °Take Xarelto® exactly as prescribed by your doctor and DO NOT stop taking Xarelto® without talking to the doctor who prescribed the medication.  Stopping without other stroke prevention medication to take the place of Xarelto® may increase your risk of developing a clot that causes a stroke.  Refill your prescription before you  run out. ° °After discharge, you should have regular check-up appointments with your healthcare provider that is prescribing your Xarelto®.  In the future your dose may need to be changed if your kidney function or weight changes by a significant amount. ° °What do you do if you miss a dose? °If you are taking Xarelto® ONCE DAILY and you miss a dose, take it as soon as you remember on the same day then continue your regularly scheduled once daily regimen the next day. Do not take two doses of Xarelto® at the same time or on the same day.  ° °Important Safety Information °A possible side effect of Xarelto® is bleeding. You should call your healthcare provider right away if you experience any of the following: °? Bleeding from an injury or your nose that does not stop. °? Unusual colored urine (red or dark brown) or unusual colored stools (red or black). °? Unusual bruising for unknown reasons. °? A serious fall or if you hit your head (even if there is no bleeding). ° °Some medicines may interact with Xarelto® and might increase your risk of bleeding while on Xarelto®. To help avoid this, consult your healthcare provider or pharmacist prior to using any new prescription or non-prescription medications, including herbals, vitamins, non-steroidal anti-inflammatory drugs (NSAIDs) and supplements. ° °This website has more information on Xarelto®: www.xarelto.com. ° °

## 2017-09-27 NOTE — Progress Notes (Signed)
Pharmacy Review for Dofetilide (Tikosyn) Initiation  Admit Complaint: 57 y.o. male admitted 09/27/2017 with atrial fibrillation to be initiated on dofetilide.   Assessment:  Patient Exclusion Criteria: If any screening criteria checked as "Yes", then  patient  should NOT receive dofetilide until criteria item is corrected. If "Yes" please indicate correction plan.  YES  NO Patient  Exclusion Criteria Correction Plan  [x]  []  Baseline QTc interval is greater than or equal to 440 msec. IF above YES box checked dofetilide contraindicated unless patient has ICD; then may proceed if QTc 500-550 msec or with known ventricular conduction abnormalities may proceed with QTc 550-600 msec. QTc =  481 Dr. Johney FrameAllred states QTc acceptable to proceed with Tikosyn load  []  [x]  Magnesium level is less than 1.8 mEq/l : Last magnesium:  Lab Results  Component Value Date   MG 1.9 09/27/2017         [x]  []  Potassium level is less than 4 mEq/l : Last potassium:  Lab Results  Component Value Date   K 3.9 09/27/2017       Supplementing with Kdur 30meq x 1 - EP note states ok to start without recheck  []  [x]  Patient is known or suspected to have a digoxin level greater than 2 ng/ml: No results found for: DIGOXIN    []  [x]  Creatinine clearance less than 20 ml/min (calculated using Cockcroft-Gault, actual body weight and serum creatinine): Estimated Creatinine Clearance: 123.2 mL/min (by C-G formula based on SCr of 1.07 mg/dL).    []  [x]  Patient has received drugs known to prolong the QT intervals within the last 48 hours (phenothiazines, tricyclics or tetracyclic antidepressants, erythromycin, H-1 antihistamines, cisapride, fluoroquinolones, azithromycin). Drugs not listed above may have an, as yet, undetected potential to prolong the QT interval, updated information on QT prolonging agents is available at this website:QT prolonging agents Metolazone, sertraline, and trazodone d/c'd PTA  []  [x]  Patient received a  dose of hydrochlorothiazide (Oretic) alone or in any combination including triamterene (Dyazide, Maxzide) in the last 48 hours.   []  [x]  Patient received a medication known to increase dofetilide plasma concentrations prior to initial dofetilide dose:  . Trimethoprim (Primsol, Proloprim) in the last 36 hours . Verapamil (Calan, Verelan) in the last 36 hours or a sustained release dose in the last 72 hours . Megestrol (Megace) in the last 5 days  . Cimetidine (Tagamet) in the last 6 hours . Ketoconazole (Nizoral) in the last 24 hours . Itraconazole (Sporanox) in the last 48 hours  . Prochlorperazine (Compazine) in the last 36 hours    []  [x]  Patient is known to have a history of torsades de pointes; congenital or acquired long QT syndromes.   []  [x]  Patient has received a Class 1 antiarrhythmic with less than 2 half-lives since last dose. (Disopyramide, Quinidine, Procainamide, Lidocaine, Mexiletine, Flecainide, Propafenone)   []  [x]  Patient has received amiodarone therapy in the past 3 months or amiodarone level is greater than 0.3 ng/ml.    Patient has been appropriately anticoagulated with Xarelto.  Ordering provider was confirmed at [email or web address] if they are not listed on the [facility] Authorized Prescribers list.  Goal of Therapy: Follow renal function, electrolytes, potential drug interactions, and dose adjustment. Provide education and 1 week supply at discharge.  Plan:  [x]   Physician selected initial dose within range recommended for patients level of renal function - will monitor for response.  []   Physician selected initial dose outside of range recommended for patients level of renal function -  will discuss if the dose should be altered at this time.   Select One Calculated CrCl  Dose q12h  [x]  > 60 ml/min 500 mcg  []  40-60 ml/min 250 mcg  []  20-40 ml/min 125 mcg   2. Follow up QTc after the first 5 doses, renal function, electrolytes (K & Mg) daily x 3     days, dose  adjustment, success of initiation and facilitate 1 week discharge supply as     clinically indicated.  3. Initiate Tikosyn education video (Call [phone] and ask for Tikosyn Video # 116).  4. Place Enrollment Form on the chart for discharge supply of dofetilide.   Babs Bertin P 2:19 PM 09/27/2017

## 2017-09-27 NOTE — Addendum Note (Signed)
Encounter addended by: Hillis RangeAllred, Tagg Eustice, MD on: 09/27/2017 10:02 PM  Actions taken: Charge Capture section accepted

## 2017-09-27 NOTE — H&P (Addendum)
Cardiology Admission History and Physical:   Patient ID: Philip [name]; MRN: [id number]; DOB: 01-Jun-1960   Admission date: 09/27/2017  Primary Care Provider: [name], MD Primary Cardiologist: Dr. Antoine Poche Primary Electrophysiologist:  New to [name]  Chief Complaint:  Tikosyn admission  Patient Profile:   Philip [name] is a 57 y.o. 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 Viverette comes today for Tikosyn initiation protocol.  He is followed in the AFib clinic, he has persistent fatigue and occasional dizziness. He has failed Flecainide.  He was seen today in the AFib clinic, pt reported no missed doses of his xarelto, QTc in AFib borderline at measured by A. Glory Buff, NP, felt reasonable to proceed  He denies any recent illness, feel generally OK, fatigued with his AF.  Discussed Tikosyn protocol, risks/benefits and he would like to proceed.  He confirms no missed doses f his Xarelto since starting it several months ago   Atrial Fibrillation Management history: Previous antiarrhythmic drugs: Flecainide Previous cardioversions: 07/2017, 08/2017  Previous ablations: none CHADS2VASC score: 2 Anticoagulation history: Xarelto   Past Medical History:  Diagnosis Date  . Arthritis   . Dental crowns present   . Diabetes mellitus    NIDDM  . Diabetic foot ulcer (HCC) 08/2011   right foot  . Fatty liver   . Gastric ulcer    no current problems  . GERD (gastroesophageal reflux disease)   . Gout   . 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 History of bronchitis as a child   . 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 Hypertension    under control; states med. is really to protect kidneys due to diabetes  . Neuropathy    feet bilat   . Obesity   . Persistent atrial fibrillation (HCC)   . Pneumonia    history of as child   . Sleep apnea    uses CPAP nightly  . 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 adenoid surgery     . AORTIC VALVE REPLACEMENT  11/2003  . CARDIAC CATHETERIZATION    . CARDIOVERSION N/A 07/25/2017   Procedure: CARDIOVERSION;  Surgeon: Chilton Si, MD;  Location: [facility] ENDOSCOPY;  Service: Cardiovascular;  Laterality: N/A;  . CARDIOVERSION N/A 09/07/2017   Procedure: CARDIOVERSION;  Surgeon: Chrystie Nose, MD;  Location: [facility] ENDOSCOPY;  Service: Cardiovascular;  Laterality: N/A;  . COLONOSCOPY WITH PROPOFOL N/A 03/30/2017   Procedure: COLONOSCOPY WITH PROPOFOL;  Surgeon: Benancio Deeds, MD;  Location: [facility] ENDOSCOPY;  Service: Gastroenterology;  Laterality: N/A;  . ESOPHAGOGASTRODUODENOSCOPY (EGD) WITH PROPOFOL N/A 03/29/2017   Procedure: ESOPHAGOGASTRODUODENOSCOPY (EGD) WITH PROPOFOL;  Surgeon: Benancio Deeds, MD;  Location: [facility] ENDOSCOPY;  Service: Gastroenterology;  Laterality: N/A;  . GASTRIC BYPASS  03/2004  . GIVENS CAPSULE STUDY N/A 03/30/2017   Procedure: GIVENS CAPSULE STUDY;  Surgeon: Benancio Deeds, MD;  Location: [facility] ENDOSCOPY;  Service: Gastroenterology;  Laterality: N/A;  . KNEE ARTHROSCOPY  08/09/2007 -  left   07/15/2003 - right  . METATARSAL OSTEOTOMY  03/20/2010   right 1st MT; gastroc soleus recession  . NASAL SINUS SURGERY    . TENDON RELEASE  09/09/2011   Procedure: HEEL CORD LENGTHENING;  Surgeon: Toni ArthursJohn Hewitt, MD;  Location: [facility];  Service: Orthopedics;  Laterality: Right;  Righ tachilles tendon lengthening  . TOE FUSION  07/12/2005   left 3rd toe PIP and DIP fusion  . TONSILLECTOMY AND  ADENOIDECTOMY    . TOTAL KNEE ARTHROPLASTY Left 03/18/2015   Procedure: LEFT TOTAL KNEE ARTHROPLASTY;  Surgeon: Durene RomansMatthew Olin, MD;  Location: [facility] ORS;  Service: Orthopedics;  Laterality: Left;     Medications Prior to Admission: Prior to Admission medications   Medication Sig Start Date End Date Taking? Authorizing Provider  Ascorbic Acid (VITAMIN C) 1000 MG tablet Take 1,000 mg by mouth daily.    [provider]  B Complex-C (SUPER B COMPLEX PO) Take 1 tablet by mouth daily.     [provider]  Calcium Carb-Cholecalciferol (CALCIUM 600/VITAMIN D3 PO) Take 1 tablet by mouth 2 (two) times daily.    [provider]  cetirizine (ZYRTEC ALLERGY) 10 MG tablet Take 10 mg by mouth daily.     [provider]  esomeprazole (NEXIUM) 40 MG capsule Take 40 mg by mouth every evening.     [provider]  Ferrous Sulfate (IRON) 325 (65 Fe) MG TABS Take 325 mg by mouth daily.     [provider]  furosemide (LASIX) 40 MG tablet Take 1 tablet (40 mg total) by mouth daily. 04/04/17   Barrett, Joline Salthonda G, PA-C  losartan (COZAAR) 25 MG tablet Take 25 mg by mouth every evening.  04/26/17   [provider]  Melatonin 3 MG CAPS Take 3 mg by mouth at bedtime.     [provider]  metolazone (ZAROXOLYN) 5 MG tablet Take 1 tablet (5 mg total) by mouth once a week. Saturdays Patient taking differently: Take 5 mg by mouth once a week.  05/23/17   Rollene RotundaHochrein, Joan Herschberger, MD  metoprolol succinate (TOPROL-XL) 50 MG 24 hr tablet Take 1 tablet (50 mg total) by mouth daily. Take with or immediately following a meal. Patient taking differently: Take 50 mg by mouth daily with supper. Take with or immediately following a meal. 06/01/17 09/27/17  Rollene RotundaHochrein, Merrill Deanda, MD  NON FORMULARY Apply 1 application topically daily. Triamcinolone-Eucerin Compound Cream    [provider]  potassium chloride SA (K-DUR,KLOR-CON) 20 MEQ tablet Take 1 tablet (20 mEq total) by mouth  daily. 09/21/17   Newman Niparroll, Donna C, NP  rivaroxaban (XARELTO) 20 MG TABS tablet Take 1 tablet (20 mg total) by mouth daily with supper. 05/23/17   Rollene RotundaHochrein, Dehlia Kilner, MD  simvastatin (ZOCOR) 10 MG tablet Take 10 mg by mouth every evening.  03/18/17   [provider]  sitaGLIPtin-metformin (JANUMET) 50-1000 MG tablet Take 1 tablet by mouth 2 (two) times daily with a meal.  03/18/17   [provider]     Allergies:    Allergies  Allergen Reactions  . Nsaids Swelling and Other (See Comments)  . Penicillins Hives, Swelling and Other (See Comments)    NO REACTION LISTED Has patient had a PCN reaction causing immediate rash, facial/tongue/throat swelling, SOB or lightheadedness with hypotension: Yes Has patient had a PCN reaction causing severe rash involving mucus membranes or skin necrosis: No Has patient had a PCN reaction that required hospitalization No Has patient had a PCN  reaction occurring within the last 10 years: No If all of the above answers are "NO", then may proceed with Cephalosporin use.  Has taken Keflex w/o issue      . Other Other (See Comments)    CYCLOOXYGENASE INHIBITORS-NO REACTION LISTED.    Social History:   Social History   Socioeconomic History  . Marital status: Married    Spouse name: Not on file  . Number of children: Not on file  . Years of education: Not on file  . Highest education level: Not on file  Occupational History  . Not on file  Social Needs  . Financial resource strain: Not on file  . Food insecurity:    Worry: Not on file    Inability: Not on file  . Transportation needs:    Medical: Not on file    Non-medical: Not on file  Tobacco Use  . Smoking status: Former Smoker    Packs/day: 1.00    Years: 2.00    Pack years: 2.00    Types: Cigars, Pipe, Cigarettes    Last attempt to quit: 01/31/2011    Years since quitting: 6.6  . Smokeless tobacco: Never Used  . Tobacco comment: smokes 1-2 cigars/week  Substance and  Sexual Activity  . Alcohol use: Not Currently    Alcohol/week: 0.0 oz  . Drug use: No  . Sexual activity: Not on file  Lifestyle  . Physical activity:    Days per week: Not on file    Minutes per session: Not on file  . Stress: Not on file  Relationships  . Social connections:    Talks on phone: Not on file    Gets together: Not on file    Attends religious service: Not on file    Active member of club or organization: Not on file    Attends meetings of clubs or organizations: Not on file    Relationship status: Not on file  . Intimate partner violence:    Fear of current or ex partner: Not on file    Emotionally abused: Not on file    Physically abused: Not on file    Forced sexual activity: Not on file  Other Topics Concern  . Not on file  Social History Narrative  . Not on file    Family History:   The patient's family history includes Alzheimer's disease in his mother; Colon cancer in his unknown relative; Diabetes in his father.    ROS:  Please see the history of present illness.  All other ROS reviewed and negative.     Physical Exam/Data:  There were no vitals filed for this visit. No intake or output data in the 24 hours ending 09/27/17 1241 There were no vitals filed for this visit. There is no height or weight on file to calculate BMI.  General:  Well nourished, well developed, in no acute distress HEENT: normal Lymph: no adenopathy Neck: no JVD Endocrine:  No thryomegaly Vascular: No carotid bruits Cardiac:  iRRR; soft SM, no gallops or rubs Lungs:  CTA b/l, no wheezing, rhonchi or rales  Abd: soft, nontender, obese  Ext: trace edema, chronic looking skin changes Musculoskeletal:  No deformities Skin: warm and dry  Neuro:  No gross focal abnormalities noted Psych:  Normal affect    EKG:  was personally reviewed and demonstrates  AFib 86bpm  Relevant CV Studies:  12/31/16 TTE Study Conclusions - Left ventricle: The cavity size was normal.  Systolic function was  vigorous. The estimated ejection fraction was in the range of 65%   to 70%. - Aortic valve: AV prosthesis appears to open well. Peak and mean   gradients through the valve are 11 and 7 mm Hg respectively.   Valve area (VTI): 2.26 cm^2. Valve area (Vmax): 2.33 cm^2. Valve   area (Vmean): 2.18 cm^2. - Mitral valve: Calcified annulus. Mildly thickened leaflets .   There was mild regurgitation.   Laboratory Data:  Chemistry Recent Labs  Lab 09/27/17 0932  NA 141  K 3.9  CL 104  CO2 30  GLUCOSE 126*  BUN 18  CREATININE 1.07  CALCIUM 9.4  GFRNONAA >60  GFRAA >60  ANIONGAP 7    No results for input(s): PROT, ALBUMIN, AST, ALT, ALKPHOS, BILITOT in the last 168 hours. HematologyNo results for input(s): WBC, RBC, HGB, HCT, MCV, MCH, MCHC, RDW, PLT in the last 168 hours. Cardiac EnzymesNo results for input(s): TROPONINI in the last 168 hours. No results for input(s): TROPIPOC in the last 168 hours.  BNPNo results for input(s): BNP, PROBNP in the last 168 hours.  DDimer No results for input(s): DDIMER in the last 168 hours.  Radiology/Studies:  No results found.  Assessment and Plan:   1. Persistent AFib     CHA2DS2Vasc is 2, on Xarelto     Here for Tikosyn initiation     K+ 3.9, replace and OK to start without recheck     Mag 1.9     Creat 1.07 (Calc CrCl is 162)     EKG is reviewed with [name], QTc is acceptable to proceed with load      [name] will see DCCV Thursday if not in SR, patient is agreeable  2. HTN     Resume home regime  3. VHD     H/o AVR (bioprosthetic with root, and MV repair     Echo last year looks OK   For questions or updates, please contact [facility] Please consult [email or web address] for contact info under Cardiology/STEMI.    Signed, Sheilah Pigeon, PA-C  09/27/2017 12:41 PM    I have seen, examined the patient, and reviewed the above assessment and plan.  Changes to above are made where necessary.  On  exam, iRRR.  The patient has symptomatic afib.  He has failed medical therapy with flecainide.  I would advise initiation of tikosyn at this time. Will follow QT closely while here. He reports compliance with anticoagulation without interruption for at least 3 weeks.  Co Sign: [name], MD 09/27/2017

## 2017-09-27 NOTE — Progress Notes (Signed)
Tikosyn given at 2000. EKG obtained 3 hours post. QTc = 489

## 2017-09-27 NOTE — Plan of Care (Signed)
  Problem: Education: Goal: Knowledge of General Education information will improve Outcome: Completed/Met   Problem: Activity: Goal: Risk for activity intolerance will decrease Outcome: Completed/Met

## 2017-09-28 ENCOUNTER — Other Ambulatory Visit: Payer: Self-pay

## 2017-09-28 DIAGNOSIS — Z79899 Other long term (current) drug therapy: Secondary | ICD-10-CM

## 2017-09-28 DIAGNOSIS — Z5181 Encounter for therapeutic drug level monitoring: Secondary | ICD-10-CM

## 2017-09-28 LAB — BASIC METABOLIC PANEL
ANION GAP: 10 (ref 5–15)
BUN: 19 mg/dL (ref 6–20)
CO2: 29 mmol/L (ref 22–32)
Calcium: 9.1 mg/dL (ref 8.9–10.3)
Chloride: 101 mmol/L (ref 101–111)
Creatinine, Ser: 1.1 mg/dL (ref 0.61–1.24)
GFR calc Af Amer: >60 mL/min (ref >60)
Glucose, Bld: 117 mg/dL — ABNORMAL HIGH (ref 65–99)
POTASSIUM: 4 mmol/L (ref 3.5–5.1)
Sodium: 140 mmol/L (ref 135–145)

## 2017-09-28 LAB — GLUCOSE, CAPILLARY
GLUCOSE-CAPILLARY: 107 mg/dL — AB (ref 65–99)
GLUCOSE-CAPILLARY: 96 mg/dL (ref 65–99)
GLUCOSE-CAPILLARY: 152 mg/dL — AB (ref 65–99)
Glucose-Capillary: 116 mg/dL — ABNORMAL HIGH (ref 65–99)

## 2017-09-28 LAB — MAGNESIUM: MAGNESIUM: 2.1 mg/dL (ref 1.7–2.4)

## 2017-09-28 MED ORDER — DOFETILIDE 250 MCG PO CAPS
250.0000 ug | ORAL_CAPSULE | Freq: Two times a day (BID) | ORAL | Status: DC
  Administered 2017-09-28 – 2017-09-30 (×4): 250 ug via ORAL
  Filled 2017-09-28 (×4): qty 1

## 2017-09-28 MED ORDER — HYDROCORTISONE 1 % EX CREA
1.0000 "application " | TOPICAL_CREAM | Freq: Three times a day (TID) | CUTANEOUS | Status: DC | PRN
  Administered 2017-09-29: 1 via TOPICAL
  Filled 2017-09-28: qty 28

## 2017-09-28 MED ORDER — SODIUM CHLORIDE 0.9% FLUSH
3.0000 mL | Freq: Two times a day (BID) | INTRAVENOUS | Status: DC
  Administered 2017-09-29 – 2017-09-30 (×3): 3 mL via INTRAVENOUS

## 2017-09-28 MED ORDER — SODIUM CHLORIDE 0.9 % IV SOLN
250.0000 mL | INTRAVENOUS | Status: DC
  Administered 2017-09-29: 500 mL via INTRAVENOUS

## 2017-09-28 MED ORDER — SODIUM CHLORIDE 0.9% FLUSH: 3.0000 mL | INTRAVENOUS | Status: DC | PRN

## 2017-09-28 NOTE — Progress Notes (Addendum)
Progress Note  Patient Name: Philip [name] Date of Encounter: 09/28/2017  Primary Cardiologist: No primary care provider on file.   Subjective   No CP, palpitations, or SOB.  Inpatient Medications    Scheduled Meds: . cetirizine  10 mg Oral Daily  . dofetilide  500 mcg Oral BID  . ferrous sulfate  325 mg Oral Daily  . furosemide  40 mg Oral Daily  . linagliptin  5 mg Oral Daily  . losartan  25 mg Oral QPM  . Melatonin  3 mg Oral QHS  . metFORMIN  1,000 mg Oral BID WC  . metoprolol succinate  50 mg Oral Q supper  . pantoprazole  80 mg Oral Q1200  . potassium chloride SA  20 mEq Oral Daily  . rivaroxaban  20 mg Oral Q supper  . simvastatin  10 mg Oral QPM  . sodium chloride flush  3 mL Intravenous Q12H  . vitamin C  1,000 mg Oral Daily   Continuous Infusions: . sodium chloride     PRN Meds: sodium chloride, sodium chloride flush   Vital Signs    Vitals:   09/27/17 1751 09/27/17 2021 09/28/17 0647 09/28/17 0647  BP: 118/72 106/82  109/84  Pulse: 80 80  72  Resp:    17  Temp:  98.1 F (36.7 C)  98.6 F (37 C)  TempSrc:  Oral  Oral  SpO2:  98%  98%  Weight:   (!) 326 lb 8 oz (148.1 kg)   Height:        Intake/Output Summary (Last 24 hours) at 09/28/2017 1012 Last data filed at 09/28/2017 0900 Gross per 24 hour  Intake 360 ml  Output -  Net 360 ml   Filed Weights   09/27/17 1521 09/28/17 0647  Weight: (!) 328 lb (148.8 kg) (!) 326 lb 8 oz (148.1 kg)    Telemetry    AFib, 70's-80;s, occ PVCs, couplet- Personally Reviewed  ECG    AFib, 68, QTc stable - Personally Reviewed  Physical Exam   GEN: No acute distress.   Neck: No JVD Cardiac: iRRR, no murmurs, rubs, or gallops.  Respiratory: CTA b/l GI: Soft, nontender, non-distended  MS: No edema; chronic skin changes, No deformity. Neuro:  Nonfocal  Psych: Normal affect   Labs    Chemistry Recent Labs  Lab 09/27/17 0932 09/28/17 0420  NA 141 140  K 3.9 4.0  CL 104 101  CO2 30 29  GLUCOSE  126* 117*  BUN 18 19  CREATININE 1.07 1.10  CALCIUM 9.4 9.1  GFRNONAA >60 >60  GFRAA >60 >60  ANIONGAP 7 10     HematologyNo results for input(s): WBC, RBC, HGB, HCT, MCV, MCH, MCHC, RDW, PLT in the last 168 hours.  Cardiac EnzymesNo results for input(s): TROPONINI in the last 168 hours. No results for input(s): TROPIPOC in the last 168 hours.   BNPNo results for input(s): BNP, PROBNP in the last 168 hours.   DDimer No results for input(s): DDIMER in the last 168 hours.   Radiology    No results found.  Cardiac Studies   12/31/16 TTE Study Conclusions - Left ventricle: The cavity size was normal. Systolic function was vigorous. The estimated ejection fraction was in the range of 65% to 70%. - Aortic valve: AV prosthesis appears to open well. Peak and mean gradients through the valve are 11 and 7 mm Hg respectively. Valve area (VTI): 2.26 cm^2. Valve area (Vmax): 2.33 cm^2. Valve area (Vmean):  2.18 cm^2. - Mitral valve: Calcified annulus. Mildly thickened leaflets . There was mild regurgitation.   Patient Profile     57 y.o. 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 Persistent AFib     CHA2DS2Vasc is 2, on Xarelto     Here for Tikosyn initiation     K+ 4.0     Mag 2.1     Creat 1.10, stable     QTc stable      DCCV tomorrow if not in SR, patient is agreeable  2. HTN     no changes today  3. VHD     H/o AVR (bioprosthetic  with root, and MV repair     Echo last year looks OK    For questions or updates, please contact [facility] Please consult [email or web address] for contact info under Cardiology/STEMI.      Signed, Sheilah PigeonRenee Lynn Ursuy, PA-C  09/28/2017, 10:12 AM    I have seen and examined this patient with Francis Dowseenee Ursuy.  Agree with above, note added to reflect my findings.  On exam, iRRR,no murmurs, lungs clear. Continued AF after initial tikosyn dose. Plan for 500 mcg today. Plan for cardioversion tomorrow.    Brysun Eschmann M. Lillyahna Hemberger MD 09/28/2017 10:37 AM

## 2017-09-28 NOTE — Plan of Care (Signed)
  Problem: Health Behavior/Discharge Planning: Goal: Ability to manage health-related needs will improve Outcome: Progressing   Problem: Clinical Measurements: Goal: Respiratory complications will improve Outcome: Completed/Met   Problem: Nutrition: Goal: Adequate nutrition will be maintained Outcome: Completed/Met

## 2017-09-28 NOTE — Progress Notes (Signed)
3rd dose of Tikosyn given. Post QTc = 504

## 2017-09-28 NOTE — H&P (View-Only) (Signed)
Progress Note  Patient Name: Philip [name] Date of Encounter: 09/28/2017  Primary Cardiologist: No primary care provider on file.   Subjective   No CP, palpitations, or SOB.  Inpatient Medications    Scheduled Meds: . cetirizine  10 mg Oral Daily  . dofetilide  500 mcg Oral BID  . ferrous sulfate  325 mg Oral Daily  . furosemide  40 mg Oral Daily  . linagliptin  5 mg Oral Daily  . losartan  25 mg Oral QPM  . Melatonin  3 mg Oral QHS  . metFORMIN  1,000 mg Oral BID WC  . metoprolol succinate  50 mg Oral Q supper  . pantoprazole  80 mg Oral Q1200  . potassium chloride SA  20 mEq Oral Daily  . rivaroxaban  20 mg Oral Q supper  . simvastatin  10 mg Oral QPM  . sodium chloride flush  3 mL Intravenous Q12H  . vitamin C  1,000 mg Oral Daily   Continuous Infusions: . sodium chloride     PRN Meds: sodium chloride, sodium chloride flush   Vital Signs    Vitals:   09/27/17 1751 09/27/17 2021 09/28/17 0647 09/28/17 0647  BP: 118/72 106/82  109/84  Pulse: 80 80  72  Resp:    17  Temp:  98.1 F (36.7 C)  98.6 F (37 C)  TempSrc:  Oral  Oral  SpO2:  98%  98%  Weight:   (!) 326 lb 8 oz (148.1 kg)   Height:        Intake/Output Summary (Last 24 hours) at 09/28/2017 1012 Last data filed at 09/28/2017 0900 Gross per 24 hour  Intake 360 ml  Output -  Net 360 ml   Filed Weights   09/27/17 1521 09/28/17 0647  Weight: (!) 328 lb (148.8 kg) (!) 326 lb 8 oz (148.1 kg)    Telemetry    AFib, 70's-80;s, occ PVCs, couplet- Personally Reviewed  ECG    AFib, 68, QTc stable - Personally Reviewed  Physical Exam   GEN: No acute distress.   Neck: No JVD Cardiac: iRRR, no murmurs, rubs, or gallops.  Respiratory: CTA b/l GI: Soft, nontender, non-distended  MS: No edema; chronic skin changes, No deformity. Neuro:  Nonfocal  Psych: Normal affect   Labs    Chemistry Recent Labs  Lab 09/27/17 0932 09/28/17 0420  NA 141 140  K 3.9 4.0  CL 104 101  CO2 30 29  GLUCOSE  126* 117*  BUN 18 19  CREATININE 1.07 1.10  CALCIUM 9.4 9.1  GFRNONAA >60 >60  GFRAA >60 >60  ANIONGAP 7 10     HematologyNo results for input(s): WBC, RBC, HGB, HCT, MCV, MCH, MCHC, RDW, PLT in the last 168 hours.  Cardiac EnzymesNo results for input(s): TROPONINI in the last 168 hours. No results for input(s): TROPIPOC in the last 168 hours.   BNPNo results for input(s): BNP, PROBNP in the last 168 hours.   DDimer No results for input(s): DDIMER in the last 168 hours.   Radiology    No results found.  Cardiac Studies   12/31/16 TTE Study Conclusions - Left ventricle: The cavity size was normal. Systolic function was vigorous. The estimated ejection fraction was in the range of 65% to 70%. - Aortic valve: AV prosthesis appears to open well. Peak and mean gradients through the valve are 11 and 7 mm Hg respectively. Valve area (VTI): 2.26 cm^2. Valve area (Vmax): 2.33 cm^2. Valve area (Vmean):  2.18 cm^2. - Mitral valve: Calcified annulus. Mildly thickened leaflets . There was mild regurgitation.   Patient Profile     57 y.o. 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 Persistent AFib     CHA2DS2Vasc is 2, on Xarelto     Here for Tikosyn initiation     K+ 4.0     Mag 2.1     Creat 1.10, stable     QTc stable      DCCV tomorrow if not in SR, patient is agreeable  2. HTN     no changes today  3. VHD     H/o AVR (bioprosthetic  with root, and MV repair     Echo last year looks OK    For questions or updates, please contact [facility] Please consult [email or web address] for contact info under Cardiology/STEMI.      Signed, Sheilah PigeonRenee Lynn Ursuy, PA-C  09/28/2017, 10:12 AM    I have seen and examined this patient with Francis Dowseenee Ursuy.  Agree with above, note added to reflect my findings.  On exam, iRRR,no murmurs, lungs clear. Continued AF after initial tikosyn dose. Plan for 500 mcg today. Plan for cardioversion tomorrow.    Tracye Szuch M. Tinzlee Craker MD 09/28/2017 10:37 AM

## 2017-09-29 ENCOUNTER — Other Ambulatory Visit: Payer: Self-pay

## 2017-09-29 ENCOUNTER — Inpatient Hospital Stay (HOSPITAL_COMMUNITY): Payer: BLUE CROSS/BLUE SHIELD | Admitting: Anesthesiology

## 2017-09-29 ENCOUNTER — Encounter (HOSPITAL_COMMUNITY): Payer: Self-pay | Admitting: *Deleted

## 2017-09-29 ENCOUNTER — Encounter (HOSPITAL_COMMUNITY)
Admission: RE | Disposition: A | Discharge status: Home / Self Care | Payer: Self-pay | Source: Ambulatory Visit | Attending: Internal Medicine

## 2017-09-29 ENCOUNTER — Ambulatory Visit (HOSPITAL_COMMUNITY)
Admission: RE | Admit: 2017-09-29 | Payer: BLUE CROSS/BLUE SHIELD | Source: Ambulatory Visit | Admitting: Cardiovascular Disease

## 2017-09-29 DIAGNOSIS — I481 Persistent atrial fibrillation: Principal | ICD-10-CM

## 2017-09-29 DIAGNOSIS — I10 Essential (primary) hypertension: Secondary | ICD-10-CM

## 2017-09-29 HISTORY — PX: CARDIOVERSION: SHX1299

## 2017-09-29 LAB — BASIC METABOLIC PANEL
Anion gap: 7 (ref 5–15)
BUN: 17 mg/dL (ref 6–20)
CO2: 30 mmol/L (ref 22–32)
CREATININE: 1.05 mg/dL (ref 0.61–1.24)
Calcium: 8.8 mg/dL — ABNORMAL LOW (ref 8.9–10.3)
Chloride: 103 mmol/L (ref 101–111)
Glucose, Bld: 121 mg/dL — ABNORMAL HIGH (ref 65–99)
POTASSIUM: 4.4 mmol/L (ref 3.5–5.1)
SODIUM: 140 mmol/L (ref 135–145)

## 2017-09-29 LAB — GLUCOSE, CAPILLARY
GLUCOSE-CAPILLARY: 123 mg/dL — AB (ref 65–99)
GLUCOSE-CAPILLARY: 152 mg/dL — AB (ref 65–99)

## 2017-09-29 LAB — MAGNESIUM: MAGNESIUM: 1.9 mg/dL (ref 1.7–2.4)

## 2017-09-29 SURGERY — CARDIOVERSION
Anesthesia: General

## 2017-09-29 MED ORDER — PROPOFOL 10 MG/ML IV BOLUS
INTRAVENOUS | Status: DC | PRN
  Administered 2017-09-29: 4 mg via INTRAVENOUS
  Administered 2017-09-29: 100 mg via INTRAVENOUS

## 2017-09-29 MED ORDER — MAGNESIUM SULFATE IN D5W 1-5 GM/100ML-% IV SOLN
1.0000 g | Freq: Once | INTRAVENOUS | Status: AC
Start: 2017-09-29 — End: 2017-09-29
  Administered 2017-09-29: 1 g via INTRAVENOUS
  Filled 2017-09-29: qty 100

## 2017-09-29 MED ORDER — LIDOCAINE 2% (20 MG/ML) 5 ML SYRINGE
INTRAMUSCULAR | Status: DC | PRN
  Administered 2017-09-29: 80 mg via INTRAVENOUS

## 2017-09-29 NOTE — Anesthesia Postprocedure Evaluation (Signed)
Anesthesia Post Note  Patient: Philip [name]  Procedure(s) Performed: CARDIOVERSION (N/A )     Patient location during evaluation: PACU Anesthesia Type: General Level of consciousness: awake and alert Pain management: pain level controlled Vital Signs Assessment: post-procedure vital signs reviewed and stable Respiratory status: spontaneous breathing, nonlabored ventilation and respiratory function stable Cardiovascular status: blood pressure returned to baseline and stable Postop Assessment: no apparent nausea or vomiting Anesthetic complications: no    Last Vitals:  Vitals:   09/29/17 1217 09/29/17 1255  BP: 127/79 107/76  Pulse:  68  Resp: 17 15  Temp: 36.7 C 36.7 C  SpO2: 100% 91%    Last Pain:  Vitals:   09/29/17 1317  TempSrc:   PainSc: 0-No pain                 Beryle Lathehomas E Brock

## 2017-09-29 NOTE — Anesthesia Preprocedure Evaluation (Addendum)
Anesthesia Evaluation  Patient identified by MRN, date of birth, ID band Patient awake    Reviewed: Allergy & Precautions, H&P , NPO status , Patient's Chart, lab work & pertinent test results, reviewed documented beta blocker date and time   Airway Mallampati: II  TM Distance: >3 FB Neck ROM: Full    Dental  (+) Teeth Intact, Dental Advisory Given   Pulmonary sleep apnea and Continuous Positive Airway Pressure Ventilation , former smoker,    Pulmonary exam normal breath sounds clear to auscultation       Cardiovascular hypertension, Pt. on medications and Pt. on home beta blockers + Peripheral Vascular Disease  + dysrhythmias Atrial Fibrillation + Valvular Problems/Murmurs  Rhythm:Irregular Rate:Normal  S/p bioprosthetic AVR and MV repair  '19 Exercise Stress - Blood pressure demonstrated a normal response to exercise. There was no ST segment deviation noted during stress. ETT with poor exercise tolerance (1:14); no chest pain; normal BP response; no ST changes; negative adequate ETT; Duke treadmill score 1.  '18 TTE -EF in range of 65% to 70%. AV prosthesis appears to open well. Peak and mean gradients through the valve are 11 and 7 mm Hg respectively. Mild MR.   Neuro/Psych negative neurological ROS  negative psych ROS   GI/Hepatic Neg liver ROS, PUD, GERD  Medicated and Controlled,S/p gastric bypass   Endo/Other  diabetes, Type 2, Oral Hypoglycemic AgentsMorbid obesity  Renal/GU negative Renal ROS  negative genitourinary   Musculoskeletal  (+) Arthritis , Osteoarthritis,  Gout   Abdominal (+) + obese,   Peds  Hematology negative hematology ROS (+)   Anesthesia Other Findings   Reproductive/Obstetrics negative OB ROS                             Anesthesia Physical  Anesthesia Plan  ASA: III  Anesthesia Plan: General   Post-op Pain Management:    Induction: Intravenous  PONV  Risk Score and Plan: 2 and Treatment may vary due to age or medical condition and Propofol infusion  Airway Management Planned: Mask and Natural Airway  Additional Equipment: None  Intra-op Plan:   Post-operative Plan:   Informed Consent: I have reviewed the patients History and Physical, chart, labs and discussed the procedure including the risks, benefits and alternatives for the proposed anesthesia with the patient or authorized representative who has indicated his/her understanding and acceptance.   Dental advisory given  Plan Discussed with: CRNA and Anesthesiologist  Anesthesia Plan Comments:         Anesthesia Quick Evaluation

## 2017-09-29 NOTE — Interval H&P Note (Signed)
History and Physical Interval Note:  09/29/2017 12:35 PM  Philip [name]  has presented today for surgery, with the diagnosis of a fib  The various methods of treatment have been discussed with the patient and family. After consideration of risks, benefits and other options for treatment, the patient has consented to  Procedure(s): CARDIOVERSION (N/A) as a surgical intervention .  The patient's history has been reviewed, patient examined, no change in status, stable for surgery.  I have reviewed the patient's chart and labs.  Questions were answered to the patient's satisfaction.     Joslyn Ramos

## 2017-09-29 NOTE — Progress Notes (Signed)
Post dccv reviewed.  Borderline QTc, will continue same 250mcg dose tonight.  I have discussed with Dr. Johney FrameAllred.  Francis Dowseenee Nuchem Grattan, PA-C

## 2017-09-29 NOTE — Progress Notes (Addendum)
Progress Note  Patient Name: Philip [name] Date of Encounter: 09/29/2017  Primary Cardiologist: No primary care provider on file.   Subjective   No CP, palpitations, or SOB.  Inpatient Medications    Scheduled Meds: . cetirizine  10 mg Oral Daily  . dofetilide  250 mcg Oral BID  . ferrous sulfate  325 mg Oral Daily  . furosemide  40 mg Oral Daily  . linagliptin  5 mg Oral Daily  . losartan  25 mg Oral QPM  . Melatonin  3 mg Oral QHS  . metFORMIN  1,000 mg Oral BID WC  . metoprolol succinate  50 mg Oral Q supper  . pantoprazole  80 mg Oral Q1200  . potassium chloride SA  20 mEq Oral Daily  . rivaroxaban  20 mg Oral Q supper  . simvastatin  10 mg Oral QPM  . sodium chloride flush  3 mL Intravenous Q12H  . sodium chloride flush  3 mL Intravenous Q12H  . vitamin C  1,000 mg Oral Daily   Continuous Infusions: . sodium chloride    . sodium chloride    . magnesium sulfate 1 - 4 g bolus IVPB 1 g (09/29/17 0832)   PRN Meds: sodium chloride, hydrocortisone cream, sodium chloride flush, sodium chloride flush   Vital Signs    Vitals:   09/28/17 1350 09/28/17 1708 09/28/17 2132 09/29/17 0517  BP: 113/72 119/81 105/71 107/67  Pulse: (!) 53 86 80 82  Resp:   18 (!) 21  Temp: 98.8 F (37.1 C)   98.2 F (36.8 C)  TempSrc: Oral   Oral  SpO2: 99%  98% 98%  Weight:    (!) 324 lb 11.2 oz (147.3 kg)  Height:        Intake/Output Summary (Last 24 hours) at 09/29/2017 0859 Last data filed at 09/29/2017 0844 Gross per 24 hour  Intake 1180 ml  Output -  Net 1180 ml   Filed Weights   09/27/17 1521 09/28/17 0647 09/29/17 0517  Weight: (!) 328 lb (148.8 kg) (!) 326 lb 8 oz (148.1 kg) (!) 324 lb 11.2 oz (147.3 kg)    Telemetry    AFib, 70's-80;s, occ PVCs, couplet- Personally Reviewed  ECG    AFib, 73, QTc stable - Personally Reviewed  Physical Exam   GEN: No acute distress.   Neck: No JVD Cardiac: iRRR, no murmurs, rubs, or gallops.  Respiratory: CTA b/l GI: Soft,  nontender, non-distended  MS: No edema; chronic skin changes, No deformity. Neuro:  Nonfocal  Psych: Normal affect   Labs    Chemistry Recent Labs  Lab 09/27/17 0932 09/28/17 0420 09/29/17 0509  NA 141 140 140  K 3.9 4.0 4.4  CL 104 101 103  CO2 30 29 30   GLUCOSE 126* 117* 121*  BUN 18 19 17   CREATININE 1.07 1.10 1.05  CALCIUM 9.4 9.1 8.8*  GFRNONAA >60 >60 >60  GFRAA >60 >60 >60  ANIONGAP 7 10 7      HematologyNo results for input(s): WBC, RBC, HGB, HCT, MCV, MCH, MCHC, RDW, PLT in the last 168 hours.  Cardiac EnzymesNo results for input(s): TROPONINI in the last 168 hours. No results for input(s): TROPIPOC in the last 168 hours.   BNPNo results for input(s): BNP, PROBNP in the last 168 hours.   DDimer No results for input(s): DDIMER in the last 168 hours.   Radiology    No results found.  Cardiac Studies   12/31/16 TTE Study Conclusions -  Left ventricle: The cavity size was normal. Systolic function was vigorous. The estimated ejection fraction was in the range of 65% to 70%. - Aortic valve: AV prosthesis appears to open well. Peak and mean gradients through the valve are 11 and 7 mm Hg respectively. Valve area (VTI): 2.26 cm^2. Valve area (Vmax): 2.33 cm^2. Valve area (Vmean): 2.18 cm^2. - Mitral valve: Calcified annulus. Mildly thickened leaflets . There was mild regurgitation.   Patient Profile     57 y.o. 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 Persistent AFib     CHA2DS2Vasc is 2, on Xarelto     Here for Tikosyn initiation     K+ 4.4     Mag 1.9, replacement ordered     Creat 1.05, stable     post 250mcg dose EKG is reviewed with Dr. Johney FrameAllred, QTc stable to continue      DCCV today, patient is agreeable  2. HTN     no changes today  3. VHD     H/o AVR (bioprosthetic with root, and MV repair     Echo last year looks OK    For questions or updates, please contact [facility] Please consult [email or web address] for contact info under Cardiology/STEMI.      Signed, Sheilah PigeonRenee Lynn Ursuy, PA-C  09/29/2017, 8:59 AM     I have seen, examined the patient, and reviewed the above assessment and plan.  Changes to above are made where necessary.  On exam, RRR.  Cardioverted to sinus.  BP is stable.  QT will need to be followed closely. Hopefully home tomorrow.  Co Sign: Hillis RangeJames Taeden Geller, MD 09/29/2017 8:32 PM

## 2017-09-29 NOTE — Op Note (Signed)
Procedure: Electrical Cardioversion Indications:  Atrial Fibrillation  Procedure Details:  Consent: Risks of procedure as well as the alternatives and risks of each were explained to the (patient/caregiver).  Consent for procedure obtained.  Time Out: Verified patient identification, verified procedure, site/side was marked, verified correct patient position, special equipment/implants available, medications/allergies/relevent history reviewed, required imaging and test results available.  Performed  Patient placed on cardiac monitor, pulse oximetry, supplemental oxygen as necessary.  Sedation given: Propofol 140 mg IV, Dr. Mal AmabileBrock Pacer pads placed anterior and posterior chest.  Cardioverted 2 time(s).  Cardioversion with synchronized biphasic 200J shock. 1st shock unsuccessful, second shock successful.  Evaluation: Findings: Post procedure EKG shows: NSR Complications: None Patient did tolerate procedure well.  Time Spent Directly with the Patient:  30 minutes   Philip [name] 09/29/2017, 12:53 PM

## 2017-09-29 NOTE — Transfer of Care (Signed)
Immediate Anesthesia Transfer of Care Note  Patient: Philip [name]  Procedure(s) Performed: CARDIOVERSION (N/A )  Patient Location: Endoscopy Unit  Anesthesia Type:General  Level of Consciousness: drowsy  Airway & Oxygen Therapy: Patient Spontanous Breathing and Patient connected to nasal cannula oxygen  Post-op Assessment: Report given to RN and Post -op Vital signs reviewed and stable  Post vital signs: Reviewed and stable  Last Vitals:  Vitals Value Taken Time  BP    Temp    Pulse    Resp    SpO2      Last Pain:  Vitals:   09/29/17 1217  TempSrc: Oral  PainSc: 0-No pain      Patients Stated Pain Goal: 0 (09/27/17 1300)  Complications: No apparent anesthesia complications

## 2017-09-29 NOTE — Anesthesia Procedure Notes (Signed)
Procedure Name: General with mask airway Date/Time: 09/29/2017 12:50 PM Performed by: Elliot DallyHuggins, Christel Bai, CRNA Pre-anesthesia Checklist: Patient identified, Emergency Drugs available, Suction available and Patient being monitored Patient Re-evaluated:Patient Re-evaluated prior to induction Oxygen Delivery Method: Ambu bag Preoxygenation: Pre-oxygenation with 100% oxygen Induction Type: IV induction

## 2017-09-30 ENCOUNTER — Other Ambulatory Visit (HOSPITAL_COMMUNITY): Payer: Self-pay | Admitting: Nurse Practitioner

## 2017-09-30 ENCOUNTER — Other Ambulatory Visit: Payer: Self-pay

## 2017-09-30 LAB — BASIC METABOLIC PANEL
Anion gap: 5 (ref 5–15)
BUN: 21 mg/dL — AB (ref 6–20)
CHLORIDE: 103 mmol/L (ref 101–111)
CO2: 29 mmol/L (ref 22–32)
Calcium: 8.7 mg/dL — ABNORMAL LOW (ref 8.9–10.3)
Creatinine, Ser: 1.22 mg/dL (ref 0.61–1.24)
Glucose, Bld: 134 mg/dL — ABNORMAL HIGH (ref 65–99)
Potassium: 4.5 mmol/L (ref 3.5–5.1)
SODIUM: 137 mmol/L (ref 135–145)

## 2017-09-30 LAB — MAGNESIUM: MAGNESIUM: 2.1 mg/dL (ref 1.7–2.4)

## 2017-09-30 LAB — GLUCOSE, CAPILLARY
Glucose-Capillary: 105 mg/dL — ABNORMAL HIGH (ref 65–99)
Glucose-Capillary: 115 mg/dL — ABNORMAL HIGH (ref 65–99)

## 2017-09-30 MED ORDER — DOFETILIDE 250 MCG PO CAPS: 250.0000 ug | ORAL_CAPSULE | Freq: Two times a day (BID) | ORAL | 6 refills | Status: DC

## 2017-09-30 MED ORDER — DOFETILIDE 250 MCG PO CAPS: 250.0000 ug | ORAL_CAPSULE | Freq: Two times a day (BID) | ORAL | Status: DC

## 2017-09-30 MED ORDER — DOFETILIDE 250 MCG PO CAPS
250.0000 ug | ORAL_CAPSULE | Freq: Two times a day (BID) | ORAL | Status: DC
  Filled 2017-09-30 (×2): qty 1120

## 2017-09-30 NOTE — Discharge Summary (Addendum)
ELECTROPHYSIOLOGY PROCEDURE DISCHARGE SUMMARY    Patient ID: Philip [name],  MRN: [id number], DOB/AGE: 57/25/1962 57 y.o.  Admit date: 09/27/2017 Discharge date: 09/30/2017  Primary Care Physician: [name], MD  Primary Cardiologist: Dr. Antoine Poche Electrophysiologist: new to [name]  Primary Discharge Diagnosis:  1.  persistent atrial fibrillation status post Tikosyn loading this admission      CHA2DS2Vasc is 2, on Xarelto  Secondary Discharge Diagnosis:  1. VHD     H/o w/ AVReplacement (bioprosthetic) and root replacement, and MV repair 2. OSA w/CPAP 3. HTN 3. DM 4. Morbid obesity    Allergies  Allergen Reactions  . Nsaids Swelling and Other (See Comments)  . Penicillins Hives, Swelling and Other (See Comments)    NO REACTION LISTED Has patient had a PCN reaction causing immediate rash, facial/tongue/throat swelling, SOB or lightheadedness with hypotension: Yes Has patient had a PCN reaction causing severe rash involving mucus membranes or skin necrosis: No Has patient had a PCN reaction that required hospitalization No Has patient had a PCN reaction occurring within the last 10 years: No If all of the above answers are "NO", then may proceed with Cephalosporin use.  Has taken Keflex w/o issue      . Other Other (See Comments)    CYCLOOXYGENASE INHIBITORS-NO REACTION LISTED.     Procedures This Admission:  1.  Tikosyn loading 2.  Direct current cardioversion on 09/29/17 by Dr Royann Shivers which successfully restored SR.  There were no early apparent complications.   Brief HPI: Philip [name] is a 57 y.o. male with a past medical history as noted above.  They were referred to EP in the outpatient setting for treatment options of atrial fibrillation.  Risks, benefits, and alternatives to Tikosyn were reviewed with the patient who wished to proceed.    Hospital Course:  The patient was admitted and Tikosyn was initiated.  Renal function and electrolytes  were followed during the hospitalization.  His QTc lengthend and his does was reduced, and  remained stable on the lowered dose.  On 09/29/17 he underwent direct current cardioversion which restored sinus rhythm.  The patient was monitored until discharge on telemetry which demonstrated SR.  On the day of discharge, he feels well, no symptoms or complaints, he was examined by Dr Johney Frame who considered the patient stable for discharge to home.  Follow-up has been arranged with the AFib clinic in 1 week and with Dr Johney Frame in 4 weeks.   The patient was advised to discontinue his once weekly metolazone, if needed to use a PRN dose of lasix, though to call the AFib clinic if doing so to monitor his BMET/electrolytes.  Prior Auth # Dofetilide is [id number], good for 24months  Physical Exam: Vitals:   09/29/17 1537 09/29/17 1731 09/29/17 2202 09/30/17 0532  BP: 99/68 126/80 108/74 107/74  Pulse: 79 79 66 61  Resp:   18 18  Temp: 98.1 F (36.7 C)  98.3 F (36.8 C) 97.8 F (36.6 C)  TempSrc: Oral  Oral Oral  SpO2: 100%  98% 100%  Weight:    (!) 329 lb 6.4 oz (149.4 kg)  Height:        GEN- The patient is well appearing, alert and oriented x 3 today.   HEENT: normocephalic, atraumatic; sclera clear, conjunctiva pink; hearing intact; oropharynx clear; neck supple, no JVP Lymph- no cervical lymphadenopathy Lungs- CTA b/l, normal work of breathing.  No wheezes, rales, rhonchi Heart- RRR, no murmurs, rubs or  gallops, PMI not laterally displaced GI- soft, non-tender, non-distended Extremities- no clubbing, cyanosis, or edema MS- no significant deformity or atrophy Skin- warm and dry, no rash or lesion Psych- euthymic mood, full affect Neuro- strength and sensation are intact   Labs:   Lab Results  Component Value Date   WBC 5.4 09/02/2017   HGB 11.5 (L) 09/02/2017   HCT 38.0 (L) 09/02/2017   MCV 87.4 09/02/2017   PLT 170 09/02/2017    Recent Labs  Lab 09/30/17 0428  NA 137  K 4.5    CL 103  CO2 29  BUN 21*  CREATININE 1.22  CALCIUM 8.7*  GLUCOSE 134*     Discharge Medications:  Allergies as of 09/30/2017      Reactions   Nsaids Swelling, Other (See Comments)   Penicillins Hives, Swelling, Other (See Comments)   NO REACTION LISTED Has patient had a PCN reaction causing immediate rash, facial/tongue/throat swelling, SOB or lightheadedness with hypotension: Yes Has patient had a PCN reaction causing severe rash involving mucus membranes or skin necrosis: No Has patient had a PCN reaction that required hospitalization No Has patient had a PCN reaction occurring within the last 10 years: No If all of the above answers are "NO", then may proceed with Cephalosporin use. Has taken Keflex w/o issue   Other Other (See Comments)   CYCLOOXYGENASE INHIBITORS-NO REACTION LISTED.      Medication List    STOP taking these medications   metolazone 5 MG tablet Commonly known as:  ZAROXOLYN     TAKE these medications   CALCIUM 600/VITAMIN D3 PO Take 1 tablet by mouth 2 (two) times daily.   dofetilide 250 MCG capsule Commonly known as:  TIKOSYN Take 1 capsule (250 mcg total) by mouth 2 (two) times daily.   esomeprazole 40 MG capsule Commonly known as:  NEXIUM Take 40 mg by mouth every evening.   furosemide 40 MG tablet Commonly known as:  LASIX Take 1 tablet (40 mg total) by mouth daily.   Iron 325 (65 Fe) MG Tabs Take 325 mg by mouth daily.   JANUMET 50-1000 MG tablet Generic drug:  sitaGLIPtin-metformin Take 1 tablet by mouth 2 (two) times daily with a meal.   losartan 25 MG tablet Commonly known as:  COZAAR Take 25 mg by mouth every evening.   Melatonin 3 MG Caps Take 3 mg by mouth at bedtime.   metoprolol succinate 50 MG 24 hr tablet Commonly known as:  TOPROL-XL Take 1 tablet (50 mg total) by mouth daily. Take with or immediately following a meal. What changed:    when to take this  additional instructions   NON FORMULARY Apply 1  application topically daily. Triamcinolone-Eucerin Compound Cream   potassium chloride SA 20 MEQ tablet Commonly known as:  K-DUR,KLOR-CON Take 1 tablet (20 mEq total) by mouth daily.   rivaroxaban 20 MG Tabs tablet Commonly known as:  XARELTO Take 1 tablet (20 mg total) by mouth daily with supper.   simvastatin 10 MG tablet Commonly known as:  ZOCOR Take 10 mg by mouth every evening.   SUPER B COMPLEX PO Take 1 tablet by mouth daily.   vitamin C 1000 MG tablet Take 1,000 mg by mouth daily.   ZYRTEC ALLERGY 10 MG tablet Generic drug:  cetirizine Take 10 mg by mouth daily.       Disposition:  Home  Discharge Instructions    Diet - low sodium heart healthy   Complete by:  As directed  Diet - low sodium heart healthy   Complete by:  As directed    Increase activity slowly   Complete by:  As directed    Increase activity slowly   Complete by:  As directed      Follow-up Information     [facility] Follow up on 10/06/2017.   Specialty:  Cardiology Why:  8:30AM Call the AFib clinic Monday morning if you need to reschedule as we discussed. Contact information: [address] [id number] [place] [id number] [phone]       Hillis RangeAllred, Siraj Dermody, MD Follow up on 10/26/2017.   Specialty:  Cardiology Why:  8:30AM Contact information: [address] [place] [address] [phone]           Duration of Discharge Encounter: Greater than 30 minutes including physician time.  Signed, Francis DowseRenee Ursuy, PA-C 09/30/2017 2:06 PM  I have seen, examined the patient, and reviewed the above assessment and plan.  Changes to above are made where necessary.  On exam, RRR.  Qt ist stable.  DC to home with close outpatient follow-up in AF clinic.  Co Sign: Hillis RangeJames Undray Allman, MD 09/30/2017 8:40 PM

## 2017-09-30 NOTE — Progress Notes (Signed)
Doing well today.  QT is stable (<500 msec) Remains in sinus  If EKG after am dose is stable, DC to home with close follow-up in AF clinic  Hillis RangeJames Brahm Barbeau MD, [facility] 09/30/2017 7:47 AM

## 2017-09-30 NOTE — Progress Notes (Signed)
QTc after 5th dose of Tikosyn = 492

## 2017-09-30 NOTE — Plan of Care (Signed)
  Problem: Health Behavior/Discharge Planning: Goal: Ability to manage health-related needs will improve Outcome: Completed/Met   Problem: Clinical Measurements: Goal: Ability to maintain clinical measurements within normal limits will improve Outcome: Completed/Met Goal: Will remain free from infection Outcome: Completed/Met   Problem: Coping: Goal: Level of anxiety will decrease Outcome: Completed/Met   Problem: Elimination: Goal: Will not experience complications related to bowel motility Outcome: Completed/Met Goal: Will not experience complications related to urinary retention Outcome: Completed/Met   Problem: Pain Managment: Goal: General experience of comfort will improve Outcome: Completed/Met   Problem: Safety: Goal: Ability to remain free from injury will improve Outcome: Completed/Met

## 2017-09-30 NOTE — Progress Notes (Signed)
Patient received discharge information and acknowledged understanding of it. Patient received tikosyn from pharmacy. Patient IV was removed.  

## 2017-10-01 ENCOUNTER — Encounter (HOSPITAL_COMMUNITY): Payer: Self-pay | Admitting: Cardiovascular Disease

## 2017-10-03 ENCOUNTER — Telehealth: Payer: Self-pay

## 2017-10-03 ENCOUNTER — Telehealth (HOSPITAL_COMMUNITY): Payer: Self-pay | Admitting: *Deleted

## 2017-10-03 NOTE — Telephone Encounter (Signed)
Patient called in stating since stopping metolazone last week he has gained 10lbs in fluid. Discussed with Dr. Johney FrameAllred -- recommends restarting metolazone 5mg  once a week and taking an extra 20meq of potassium on day of metolazone dose. Pt verbalized understanding - coming in on Wednesday for bmet.

## 2017-10-03 NOTE — Telephone Encounter (Signed)
Letter received via fax from [facility] stating that the pts Dofetilide PA has been approved. Approval good from 09/30/17 until 10/01/19.  I have sent the approval letter to be scanned into the pts chart.

## 2017-10-05 ENCOUNTER — Ambulatory Visit (HOSPITAL_COMMUNITY)
Admission: RE | Admit: 2017-10-05 | Discharge: 2017-10-05 | Disposition: A | Discharge status: Home / Self Care | Payer: BLUE CROSS/BLUE SHIELD | Source: Ambulatory Visit | Attending: Nurse Practitioner | Admitting: Nurse Practitioner

## 2017-10-05 ENCOUNTER — Other Ambulatory Visit: Payer: Self-pay

## 2017-10-05 ENCOUNTER — Encounter (HOSPITAL_COMMUNITY): Payer: Self-pay | Admitting: Nurse Practitioner

## 2017-10-05 VITALS — BP 112/68 | HR 60 | Ht 77.0 in | Wt 332.9 lb

## 2017-10-05 DIAGNOSIS — Z7901 Long term (current) use of anticoagulants: Secondary | ICD-10-CM | POA: Insufficient documentation

## 2017-10-05 DIAGNOSIS — Z9889 Other specified postprocedural states: Secondary | ICD-10-CM | POA: Insufficient documentation

## 2017-10-05 DIAGNOSIS — Z79899 Other long term (current) drug therapy: Secondary | ICD-10-CM | POA: Diagnosis not present

## 2017-10-05 DIAGNOSIS — G4733 Obstructive sleep apnea (adult) (pediatric): Secondary | ICD-10-CM

## 2017-10-05 DIAGNOSIS — Z952 Presence of prosthetic heart valve: Secondary | ICD-10-CM

## 2017-10-05 DIAGNOSIS — Z8719 Personal history of other diseases of the digestive system: Secondary | ICD-10-CM | POA: Diagnosis not present

## 2017-10-05 DIAGNOSIS — Z886 Allergy status to analgesic agent status: Secondary | ICD-10-CM | POA: Diagnosis not present

## 2017-10-05 DIAGNOSIS — Z880 Allergy status to penicillin: Secondary | ICD-10-CM | POA: Insufficient documentation

## 2017-10-05 DIAGNOSIS — Z888 Allergy status to other drugs, medicaments and biological substances status: Secondary | ICD-10-CM | POA: Diagnosis not present

## 2017-10-05 DIAGNOSIS — Z87891 Personal history of nicotine dependence: Secondary | ICD-10-CM | POA: Insufficient documentation

## 2017-10-05 DIAGNOSIS — Z9884 Bariatric surgery status: Secondary | ICD-10-CM | POA: Diagnosis not present

## 2017-10-05 DIAGNOSIS — Z9989 Dependence on other enabling machines and devices: Secondary | ICD-10-CM | POA: Diagnosis not present

## 2017-10-05 DIAGNOSIS — Z953 Presence of xenogenic heart valve: Secondary | ICD-10-CM | POA: Diagnosis not present

## 2017-10-05 DIAGNOSIS — Z96652 Presence of left artificial knee joint: Secondary | ICD-10-CM | POA: Insufficient documentation

## 2017-10-05 DIAGNOSIS — K219 Gastro-esophageal reflux disease without esophagitis: Secondary | ICD-10-CM | POA: Diagnosis not present

## 2017-10-05 DIAGNOSIS — Z6839 Body mass index (BMI) 39.0-39.9, adult: Secondary | ICD-10-CM | POA: Insufficient documentation

## 2017-10-05 DIAGNOSIS — Z820 Family history of epilepsy and other diseases of the nervous system: Secondary | ICD-10-CM | POA: Insufficient documentation

## 2017-10-05 DIAGNOSIS — Z833 Family history of diabetes mellitus: Secondary | ICD-10-CM | POA: Diagnosis not present

## 2017-10-05 DIAGNOSIS — I110 Hypertensive heart disease with heart failure: Secondary | ICD-10-CM | POA: Diagnosis not present

## 2017-10-05 DIAGNOSIS — I5033 Acute on chronic diastolic (congestive) heart failure: Secondary | ICD-10-CM

## 2017-10-05 DIAGNOSIS — I481 Persistent atrial fibrillation: Secondary | ICD-10-CM

## 2017-10-05 DIAGNOSIS — E1140 Type 2 diabetes mellitus with diabetic neuropathy, unspecified: Secondary | ICD-10-CM | POA: Diagnosis not present

## 2017-10-05 DIAGNOSIS — I4819 Other persistent atrial fibrillation: Secondary | ICD-10-CM

## 2017-10-05 DIAGNOSIS — Z7984 Long term (current) use of oral hypoglycemic drugs: Secondary | ICD-10-CM | POA: Insufficient documentation

## 2017-10-05 LAB — BASIC METABOLIC PANEL
ANION GAP: 9 (ref 5–15)
BUN: 16 mg/dL (ref 6–20)
CHLORIDE: 102 mmol/L (ref 101–111)
CO2: 30 mmol/L (ref 22–32)
Calcium: 9.3 mg/dL (ref 8.9–10.3)
Creatinine, Ser: 1.09 mg/dL (ref 0.61–1.24)
Glucose, Bld: 115 mg/dL — ABNORMAL HIGH (ref 65–99)
Potassium: 3.9 mmol/L (ref 3.5–5.1)
SODIUM: 141 mmol/L (ref 135–145)

## 2017-10-05 LAB — MAGNESIUM: MAGNESIUM: 1.8 mg/dL (ref 1.7–2.4)

## 2017-10-05 NOTE — Progress Notes (Signed)
Primary Care Physician: [name], MD Primary Cardiologist: Antoine Poche Primary Electrophysiologist: Philip [name] is a 57 y.o. male with a history of persistent atrial fibrillation who presents for follow up in the [facility].  Since discharge for Tikosyn load the patient reports doing very well.  His energy level has improved. He has noticed some fluid overload and restarted Metolazone once weekly.  He takes an extra K+ when he takes Metolazone. He [name] his dry weight is around 323 pounds.  Today, he  denies symptoms of palpitations, chest pain, shortness of breath, orthopnea, PND, lower extremity edema, dizziness, presyncope, syncope, snoring, daytime somnolence, bleeding, or neurologic sequela. The patient is tolerating medications without difficulties and is otherwise without complaint today.    Atrial Fibrillation Risk Factors:  he does have symptoms or diagnosis of sleep apnea. he is compliant with CPAP therapy.  he does not have a history of rheumatic fever.  he has a BMI of Body mass index is 39.48 kg/m.Marland Kitchen Filed Weights   10/05/17 1345  Weight: (!) 332 lb 14.4 oz (151 kg)    LA size: 46   Atrial Fibrillation Management history:  Previous antiarrhythmic drugs: Flecainide -> Tikosyn  Previous cardioversions: 07/2017, 08/2017  Previous ablations: none  CHADS2VASC score: 2  Anticoagulation history: Xarelto   Past Medical History:  Diagnosis Date  . Arthritis   . Dental crowns present   . Diabetes mellitus    NIDDM  . Diabetic foot ulcer (HCC) 08/2011   right foot  . Fatty liver   . Gastric ulcer    no current problems  . GERD (gastroesophageal reflux disease)   . Gout   . 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 Hx of bacterial endocarditis 11/2003  . Hypertension   . Neuropathy    feet bilat   . Obesity   . Persistent atrial  fibrillation (HCC)   . Sleep apnea    uses CPAP nightly  . Toe deformity 08/2011   right claw hallux   Past Surgical History:  Procedure Laterality Date  . adenoid surgery     . AORTIC VALVE REPLACEMENT  11/2003  . CARDIOVERSION N/A 07/25/2017   Procedure: CARDIOVERSION;  Surgeon: Chilton Si, MD;  Location: [facility] ENDOSCOPY;  Service: Cardiovascular;  Laterality: N/A;  . CARDIOVERSION N/A 09/07/2017   Procedure: CARDIOVERSION;  Surgeon: Chrystie Nose, MD;  Location: [facility] ENDOSCOPY;  Service: Cardiovascular;  Laterality: N/A;  . CARDIOVERSION N/A 09/29/2017   Procedure: CARDIOVERSION;  Surgeon: Thurmon Fair, MD;  Location: [facility] ENDOSCOPY;  Service: Cardiovascular;  Laterality: N/A;  . COLONOSCOPY WITH PROPOFOL N/A 03/30/2017   Procedure: COLONOSCOPY WITH PROPOFOL;  Surgeon: Benancio Deeds, MD;  Location: [facility] ENDOSCOPY;  Service: Gastroenterology;  Laterality: N/A;  . ESOPHAGOGASTRODUODENOSCOPY (EGD) WITH PROPOFOL N/A 03/29/2017   Procedure: ESOPHAGOGASTRODUODENOSCOPY (EGD) WITH PROPOFOL;  Surgeon: Benancio Deeds, MD;  Location: [facility] ENDOSCOPY;  Service: Gastroenterology;  Laterality: N/A;  . GASTRIC BYPASS  03/2004  . GIVENS CAPSULE STUDY N/A 03/30/2017   Procedure: GIVENS CAPSULE STUDY;  Surgeon: Benancio Deeds, MD;  Location: [facility] ENDOSCOPY;  Service: Gastroenterology;  Laterality: N/A;  . KNEE ARTHROSCOPY  08/09/2007 - left   07/15/2003 - right  . METATARSAL OSTEOTOMY  03/20/2010   right 1st MT; gastroc soleus recession  . NASAL SINUS SURGERY    . TENDON RELEASE  09/09/2011  Procedure: HEEL CORD LENGTHENING;  Surgeon: Toni Arthurs, MD;  Location: [facility];  Service: Orthopedics;  Laterality: Right;  Righ tachilles tendon lengthening  . TOE FUSION  07/12/2005   left 3rd toe PIP and DIP fusion  . TONSILLECTOMY AND ADENOIDECTOMY    . TOTAL KNEE ARTHROPLASTY Left 03/18/2015   Procedure: LEFT TOTAL KNEE ARTHROPLASTY;  Surgeon: Durene Romans, MD;  Location: [facility]  ORS;  Service: Orthopedics;  Laterality: Left;    Current Outpatient Medications  Medication Sig Dispense Refill  . Ascorbic Acid (VITAMIN C) 1000 MG tablet Take 1,000 mg by mouth daily.    . B Complex-C (SUPER B COMPLEX PO) Take 1 tablet by mouth daily.     . Calcium Carb-Cholecalciferol (CALCIUM 600/VITAMIN D3 PO) Take 1 tablet by mouth 2 (two) times daily.    . cetirizine (ZYRTEC ALLERGY) 10 MG tablet Take 10 mg by mouth daily.     Marland Kitchen dofetilide (TIKOSYN) 250 MCG capsule Take 1 capsule (250 mcg total) by mouth 2 (two) times daily. 60 capsule 6  . esomeprazole (NEXIUM) 40 MG capsule Take 40 mg by mouth every evening.     . Ferrous Sulfate (IRON) 325 (65 Fe) MG TABS Take 325 mg by mouth daily.     . furosemide (LASIX) 40 MG tablet Take 1 tablet (40 mg total) by mouth daily. 30 tablet 6  . losartan (COZAAR) 25 MG tablet Take 25 mg by mouth every evening.     . Melatonin 3 MG CAPS Take 3 mg by mouth at bedtime.     . metolazone (ZAROXOLYN) 5 MG tablet Take 5 mg by mouth once a week.    . metoprolol succinate (TOPROL-XL) 50 MG 24 hr tablet Take 1 tablet (50 mg total) by mouth daily. Take with or immediately following a meal. (Patient taking differently: Take 50 mg by mouth daily with supper. Take with or immediately following a meal.) 90 tablet 3  . NON FORMULARY Apply 1 application topically daily. Triamcinolone-Eucerin Compound Cream    . potassium chloride SA (K-DUR,KLOR-CON) 20 MEQ tablet Take 1 tablet (20 mEq total) by mouth daily. 90 tablet 2  . potassium chloride SA (KLOR-CON M20) 20 MEQ tablet TAKE 2 TABLETS BY MOUTH TODAY THEN REDUCE TO 1 TABLET DAILY (Patient taking differently: Take one tablet daily and 1 extra on metolazone days) 31 tablet 0  . rivaroxaban (XARELTO) 20 MG TABS tablet Take 1 tablet (20 mg total) by mouth daily with supper. 90 tablet 1  . simvastatin (ZOCOR) 10 MG tablet Take 10 mg by mouth every evening.     . sitaGLIPtin-metformin (JANUMET) 50-1000 MG tablet Take 1  tablet by mouth 2 (two) times daily with a meal.      No current facility-administered medications for this encounter.     Allergies  Allergen Reactions  . Nsaids Swelling and Other (See Comments)  . Penicillins Hives, Swelling and Other (See Comments)    NO REACTION LISTED Has patient had a PCN reaction causing immediate rash, facial/tongue/throat swelling, SOB or lightheadedness with hypotension: Yes Has patient had a PCN reaction causing severe rash involving mucus membranes or skin necrosis: No Has patient had a PCN reaction that required hospitalization No Has patient had a PCN reaction occurring within the last 10 years: No If all of the above answers are "NO", then may proceed with Cephalosporin use.  Has taken Keflex w/o issue      . Other Other (See Comments)    CYCLOOXYGENASE INHIBITORS-NO REACTION  LISTED.    Social History   Socioeconomic History  . Marital status: Married    Spouse name: Not on file  . Number of children: Not on file  . Years of education: Not on file  . Highest education level: Not on file  Occupational History  . Not on file  Social Needs  . Financial resource strain: Not on file  . Food insecurity:    Worry: Not on file    Inability: Not on file  . Transportation needs:    Medical: Not on file    Non-medical: Not on file  Tobacco Use  . Smoking status: Former Smoker    Packs/day: 1.00    Years: 2.00    Pack years: 2.00    Types: Cigars, Pipe, Cigarettes    Last attempt to quit: 01/31/2011    Years since quitting: 6.6  . Smokeless tobacco: Never Used  . Tobacco comment: smokes 1-2 cigars/week  Substance and Sexual Activity  . Alcohol use: Not Currently    Alcohol/week: 0.0 oz  . Drug use: No  . Sexual activity: Not on file  Lifestyle  . Physical activity:    Days per week: Not on file    Minutes per session: Not on file  . Stress: Not on file  Relationships  . Social connections:    Talks on phone: Not on file    Gets  together: Not on file    Attends religious service: Not on file    Active member of club or organization: Not on file    Attends meetings of clubs or organizations: Not on file    Relationship status: Not on file  . Intimate partner violence:    Fear of current or ex partner: Not on file    Emotionally abused: Not on file    Physically abused: Not on file    Forced sexual activity: Not on file  Other Topics Concern  . Not on file  Social History Narrative  . Not on file    Family History  Problem Relation Age of Onset  . Alzheimer's disease Mother   . Diabetes Father   . Colon cancer Unknown        family history    ROS- All systems are reviewed and negative except as per the HPI above.  Physical Exam: Vitals:   10/05/17 1345  BP: 112/68  Pulse: 60  Weight: (!) 332 lb 14.4 oz (151 kg)  Height: 6\' 5"  (1.956 m)    GEN- The patient is well appearing, alert and oriented x 3 today.   Head- normocephalic, atraumatic Eyes-  Sclera clear, conjunctiva pink Ears- hearing intact Oropharynx- clear Neck- supple  Lungs- Clear to ausculation bilaterally, normal work of breathing Heart- Regular rate and rhythm  GI- soft, NT, ND, + BS Extremities- no clubbing, cyanosis, or edema MS- no significant deformity or atrophy Skin- no rash or lesion Psych- euthymic mood, full affect Neuro- strength and sensation are intact  Wt Readings from Last 3 Encounters:  10/05/17 (!) 332 lb 14.4 oz (151 kg)  09/30/17 (!) 329 lb 6.4 oz (149.4 kg)  09/27/17 (!) 328 lb (148.8 kg)    EKG today demonstrates sinus rhythm, rate 60, QTc  Epic records are reviewed at length today  Assessment and Plan:  1. Persistent atrial fibrillation Maintaining SR on Tikosyn Continue Xarelto for CHADS2VASC of 2 QTc stable today BMET, Mg today  2.  Morbid obesity Body mass index is 39.48 kg/m. Weight loss  encouarge  3.  OSA Compliant with CPAP  4.  Acute on chronic diastolic heart  failure Improving with resumption of Metolazone BMET today  5.  S/p bioprosthetic AVR Stable by echo 12/2016 Prior endocarditis  Follow up with Dr Johney FrameAllred as scheduled  Gypsy BalsamAmber Seiler, NP 10/05/2017 2:18 PM

## 2017-10-06 ENCOUNTER — Ambulatory Visit (HOSPITAL_COMMUNITY): Payer: BLUE CROSS/BLUE SHIELD | Admitting: Nurse Practitioner

## 2017-10-07 ENCOUNTER — Telehealth: Payer: Self-pay | Admitting: Cardiology

## 2017-10-07 NOTE — Telephone Encounter (Signed)
Called pharmacist and notified her that our clinical pharmacist has reviewed current meds and it is OK for patient to use tikosyn

## 2017-10-07 NOTE — Telephone Encounter (Signed)
Routed to [facility] for review of meds w/tikosyn

## 2017-10-07 NOTE — Telephone Encounter (Signed)
New Message:       Pt c/o medication issue:  1. Name of Medication: dofetilide (TIKOSYN) 250 MCG capsule  2. How are you currently taking this medication (dosage and times per day)?Take 1 capsule (250 mcg total) by mouth 2 (two) times daily.   3. Are you having a reaction (difficulty breathing--STAT)? No  4. What is your medication issue? [facility] sure that [facility] are aware of the drug reaction this may cause with some of the other medications this pt is on at the moment.

## 2017-10-07 NOTE — Telephone Encounter (Signed)
Reviewed medication profile.  Current medications are acceptable.  There is some concern with metolazone, however patient uses prn and AF clinic is aware.  Please ask pharmacy to fill Tikosyn

## 2017-10-10 NOTE — Addendum Note (Signed)
Encounter addended by: Newman Niparroll, Jurnee Nakayama C, NP on: 10/10/2017 8:48 AM  Actions taken: LOS modified

## 2017-10-17 ENCOUNTER — Encounter: Payer: Self-pay | Admitting: Internal Medicine

## 2017-10-26 ENCOUNTER — Encounter: Payer: Self-pay | Admitting: Internal Medicine

## 2017-10-26 ENCOUNTER — Ambulatory Visit (INDEPENDENT_AMBULATORY_CARE_PROVIDER_SITE_OTHER): Payer: BLUE CROSS/BLUE SHIELD | Admitting: Internal Medicine

## 2017-10-26 VITALS — BP 124/60 | HR 63 | Ht 77.0 in | Wt 326.0 lb

## 2017-10-26 DIAGNOSIS — I481 Persistent atrial fibrillation: Secondary | ICD-10-CM

## 2017-10-26 DIAGNOSIS — I5032 Chronic diastolic (congestive) heart failure: Secondary | ICD-10-CM | POA: Diagnosis not present

## 2017-10-26 DIAGNOSIS — Z952 Presence of prosthetic heart valve: Secondary | ICD-10-CM | POA: Diagnosis not present

## 2017-10-26 DIAGNOSIS — I4819 Other persistent atrial fibrillation: Secondary | ICD-10-CM

## 2017-10-26 DIAGNOSIS — G4733 Obstructive sleep apnea (adult) (pediatric): Secondary | ICD-10-CM | POA: Diagnosis not present

## 2017-10-26 DIAGNOSIS — Z9989 Dependence on other enabling machines and devices: Secondary | ICD-10-CM | POA: Diagnosis not present

## 2017-10-26 MED ORDER — DOFETILIDE 250 MCG PO CAPS: 250.0000 ug | ORAL_CAPSULE | Freq: Two times a day (BID) | ORAL | 3 refills | Status: DC

## 2017-10-26 NOTE — Progress Notes (Signed)
PCP: [name], MD Primary Cardiologist: Dr Antoine Poche Primary EP: Dr Graylon Gunning DELAND SLOCUMB is a 57 y.o. male who presents today for routine electrophysiology followup.  Since his recent discharge for tikosyn load, the patient has done well.  Energy has improved.  Today, he denies symptoms of palpitations, chest pain, shortness of breath,  lower extremity edema, dizziness, presyncope, or syncope.  + compliant with CPAP  The patient is otherwise without complaint today.   Past Medical History:  Diagnosis Date  . Arthritis   . Dental crowns present   . Diabetes mellitus    NIDDM  . Diabetic foot ulcer (HCC) 08/2011   right foot  . Fatty liver   . Gastric ulcer    no current problems  . GERD (gastroesophageal reflux disease)   . Gout   . 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 Hx of bacterial endocarditis 11/2003  . Hypertension   . Neuropathy    feet bilat   . Obesity   . Persistent atrial fibrillation (HCC)   . Sleep apnea    uses CPAP nightly  . Toe deformity 08/2011   right claw hallux   Past Surgical History:  Procedure Laterality Date  . adenoid surgery     . AORTIC VALVE REPLACEMENT  11/2003  . CARDIOVERSION N/A 07/25/2017   Procedure: CARDIOVERSION;  Surgeon: Chilton Si, MD;  Location: [facility] ENDOSCOPY;  Service: Cardiovascular;  Laterality: N/A;  . CARDIOVERSION N/A 09/07/2017   Procedure: CARDIOVERSION;  Surgeon: Chrystie Nose, MD;  Location: [facility] ENDOSCOPY;  Service: Cardiovascular;  Laterality: N/A;  . CARDIOVERSION N/A 09/29/2017   Procedure: CARDIOVERSION;  Surgeon: Thurmon Fair, MD;  Location: [facility] ENDOSCOPY;  Service: Cardiovascular;  Laterality: N/A;  . COLONOSCOPY WITH PROPOFOL N/A 03/30/2017   Procedure: COLONOSCOPY WITH PROPOFOL;  Surgeon: Benancio Deeds, MD;  Location: [facility] ENDOSCOPY;  Service: Gastroenterology;  Laterality: N/A;  .  ESOPHAGOGASTRODUODENOSCOPY (EGD) WITH PROPOFOL N/A 03/29/2017   Procedure: ESOPHAGOGASTRODUODENOSCOPY (EGD) WITH PROPOFOL;  Surgeon: Benancio Deeds, MD;  Location: [facility] ENDOSCOPY;  Service: Gastroenterology;  Laterality: N/A;  . GASTRIC BYPASS  03/2004  . GIVENS CAPSULE STUDY N/A 03/30/2017   Procedure: GIVENS CAPSULE STUDY;  Surgeon: Benancio Deeds, MD;  Location: [facility] ENDOSCOPY;  Service: Gastroenterology;  Laterality: N/A;  . KNEE ARTHROSCOPY  08/09/2007 - left   07/15/2003 - right  . METATARSAL OSTEOTOMY  03/20/2010   right 1st MT; gastroc soleus recession  . NASAL SINUS SURGERY    . TENDON RELEASE  09/09/2011   Procedure: HEEL CORD LENGTHENING;  Surgeon: Toni Arthurs, MD;  Location: [facility];  Service: Orthopedics;  Laterality: Right;  Righ tachilles tendon lengthening  . TOE FUSION  07/12/2005   left 3rd toe PIP and DIP fusion  . TONSILLECTOMY AND ADENOIDECTOMY    . TOTAL KNEE ARTHROPLASTY Left 03/18/2015   Procedure: LEFT TOTAL KNEE ARTHROPLASTY;  Surgeon: Durene Romans, MD;  Location: [facility] ORS;  Service: Orthopedics;  Laterality: Left;    ROS- all systems are reviewed and negatives except as per HPI above  Current Outpatient Medications  Medication Sig Dispense Refill  . Ascorbic Acid (VITAMIN C) 1000 MG tablet Take 1,000 mg by mouth daily.    . B Complex-C (SUPER B COMPLEX PO) Take 1 tablet by mouth daily.     . Calcium Carb-Cholecalciferol (CALCIUM 600/VITAMIN D3 PO) Take 1 tablet by mouth  2 (two) times daily.    . cetirizine (ZYRTEC ALLERGY) 10 MG tablet Take 10 mg by mouth daily.     . clindamycin (CLEOCIN) 300 MG capsule Take 300 mg by mouth 3 (three) times daily.    Marland Kitchen dofetilide (TIKOSYN) 250 MCG capsule Take 1 capsule (250 mcg total) by mouth 2 (two) times daily. 60 capsule 6  . esomeprazole (NEXIUM) 40 MG capsule Take 40 mg by mouth every evening.     . Ferrous Sulfate (IRON) 325 (65 Fe) MG TABS Take 325 mg by mouth daily.     . furosemide (LASIX) 40 MG  tablet Take 1 tablet (40 mg total) by mouth daily. 30 tablet 6  . losartan (COZAAR) 25 MG tablet Take 25 mg by mouth every evening.     . Melatonin 3 MG CAPS Take 3 mg by mouth at bedtime.     . metolazone (ZAROXOLYN) 5 MG tablet Take 5 mg by mouth once a week.    . NON FORMULARY Apply 1 application topically daily. Triamcinolone-Eucerin Compound Cream    . potassium chloride SA (K-DUR,KLOR-CON) 20 MEQ tablet Take 1 tablet (20 mEq total) by mouth daily. (Patient taking differently: Take 20 mEq by mouth daily. 1 extra tablet on days taking metolazone) 90 tablet 2  . PRESCRIPTION MEDICATION Inject 1 Dose as directed. Cortisone Shot in Right Shoulder Joint.    . rivaroxaban (XARELTO) 20 MG TABS tablet Take 1 tablet (20 mg total) by mouth daily with supper. 90 tablet 1  . simvastatin (ZOCOR) 10 MG tablet Take 10 mg by mouth every evening.     . sitaGLIPtin-metformin (JANUMET) 50-1000 MG tablet Take 1 tablet by mouth 2 (two) times daily with a meal.     . metoprolol succinate (TOPROL-XL) 50 MG 24 hr tablet Take 1 tablet (50 mg total) by mouth daily. Take with or immediately following a meal. (Patient taking differently: Take 50 mg by mouth daily with supper. Take with or immediately following a meal.) 90 tablet 3   No current facility-administered medications for this visit.     Physical Exam: Vitals:   10/26/17 0838  BP: 124/60  Pulse: 63  Weight: (!) 326 lb (147.9 kg)  Height: 6\' 5"  (1.956 m)    GEN- The patient is overweight appearing, alert and oriented x 3 today.   Head- normocephalic, atraumatic Eyes-  Sclera clear, conjunctiva pink Ears- hearing intact Oropharynx- clear Lungs- Clear to ausculation bilaterally, normal work of breathing Heart- Regular rate and rhythm, no murmurs, rubs or gallops, PMI not laterally displaced GI- soft, NT, ND, + BS Extremities- no clubbing, cyanosis, or edema  Wt Readings from Last 3 Encounters:  10/26/17 (!) 326 lb (147.9 kg)  10/05/17 (!) 332 lb  14.4 oz (151 kg)  09/30/17 (!) 329 lb 6.4 oz (149.4 kg)    EKG tracing ordered today is personally reviewed and shows sinus rhythm 63 bpm, PR 168 msec, QRS 106 msec, Qtc 468 msec  Assessment and Plan:  1. Persistent afib Improved with tikosyn Qt is stable On xarelto for chads2vasc score of 2.  2. Obesity Body mass index is 38.66 kg/m. Wt Readings from Last 3 Encounters:  10/26/17 (!) 326 lb (147.9 kg)  10/05/17 (!) 332 lb 14.4 oz (151 kg)  09/30/17 (!) 329 lb 6.4 oz (149.4 kg)   Lifestyle modification is strongly encouraged.  He has lost over a 150 lbs already!  3. HTN Stable No change required today  4. Valvular heart disease followed by  Dr Antoine PocheHochrein  Return to see Lupita LeashDonna in AF clinic in 3 months Dr Antoine PocheHochrein and AF clinic to follow going forward I will see when needed  Hillis RangeJames Rigby Swamy MD, [facility] HospitalFACC 10/26/2017 8:52 AM

## 2017-10-26 NOTE — Patient Instructions (Addendum)
Medication Instructions:  °Your physician recommends that you continue on your current medications as directed. Please refer to the Current Medication list given to you today. ° °Labwork: °None ordered. ° °Testing/Procedures: °None ordered. ° °Follow-Up: °Your physician wants you to follow-up in: 3 months with AFIB clinic. ° °Any Other Special Instructions Will Be Listed Below (If Applicable). ° °If you need a refill on your cardiac medications before your next appointment, please call your pharmacy.  ° ° ° ° °

## 2017-11-03 ENCOUNTER — Encounter: Payer: Self-pay | Admitting: Internal Medicine

## 2017-11-03 NOTE — Telephone Encounter (Signed)
I have spoken at length with [employer], [facility] and [facility], they all say that there is a prior authorization on file for the TIKOSYN good until 10/01/2019. I have spoken with patient several times and informed him of this information. Also [facility] state they have mailed out med today.

## 2017-11-18 ENCOUNTER — Encounter: Payer: Self-pay | Admitting: Cardiology

## 2017-11-20 ENCOUNTER — Other Ambulatory Visit: Payer: Self-pay | Admitting: Cardiology

## 2017-12-07 ENCOUNTER — Telehealth: Payer: Self-pay | Admitting: *Deleted

## 2017-12-07 NOTE — Telephone Encounter (Signed)
   [facility] Pre-operative Risk Assessment    Request for surgical clearance:  1. What type of surgery is being performed? 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 When is this surgery scheduled? TBD   3. What type of clearance is required (medical clearance vs. Pharmacy clearance to hold med vs. Both)? both  4. Are there any medications that need to be held prior to surgery and how long? Xarelto    5. Practice name and name of physician performing surgery? [facility] Dr. Doran Durand   6. What is your office phone number [phone]     7.   What is your office fax number [phone] attn: [place] Chard  8.   Anesthesia type (None, local, MAC, general) ? Local w/ MAC   Dezmin Kittelson A Virgilia Quigg 12/07/2017, 4:38 PM  _________________________________________________________________   (provider comments below)

## 2017-12-09 NOTE — Telephone Encounter (Signed)
Please address Xarelto

## 2017-12-12 NOTE — Telephone Encounter (Signed)
Pt takes Xarelto for afib with CHADS2VASc score of 3 (HTN, CHF, DM), also with hx of bioprosthetic AVR. Pt underwent DCCV on 09/29/17, now > 2 months out from procedure. Renal function is normal. Ok to hold Xarelto 2 days prior to toe amputation.

## 2017-12-13 NOTE — Telephone Encounter (Signed)
   Primary Cardiologist: Hillis RangeJames Allred, MD  Chart reviewed as part of pre-operative protocol coverage. Patient was contacted 12/13/2017 in reference to pre-operative risk assessment for pending surgery as outlined below.  Philip [name] was last seen on 10/26/17 by Dr. Johney FrameAllred.  Since that day, Philip [name] has done well. He can complete more than 4.0 METS.  Regarding xarelto, see note below from our pharmacist: Pt takes Xarelto for afib with CHADS2VASc score of 3 (HTN, CHF, DM), also with hx of bioprosthetic AVR. Pt underwent DCCV on 09/29/17, now > 2 months out from procedure. Renal function is normal. Ok to hold Xarelto 2 days prior to toe amputation.  Therefore, based on ACC/AHA guidelines, the patient would be at acceptable risk for the planned procedure without further cardiovascular testing.   I will route this recommendation to the requesting party via Epic fax function and remove from pre-op pool.  Please call with questions.  Roe RutherfordAngela Nicole Duke, PA 12/13/2017, 4:00 PM

## 2018-01-30 DIAGNOSIS — D509 Iron deficiency anemia, unspecified: Secondary | ICD-10-CM | POA: Insufficient documentation

## 2018-02-01 ENCOUNTER — Ambulatory Visit (HOSPITAL_COMMUNITY)
Admission: RE | Admit: 2018-02-01 | Discharge: 2018-02-01 | Disposition: A | Discharge status: Home / Self Care | Payer: BLUE CROSS/BLUE SHIELD | Source: Ambulatory Visit | Attending: Nurse Practitioner | Admitting: Nurse Practitioner

## 2018-02-01 ENCOUNTER — Encounter (HOSPITAL_COMMUNITY): Payer: Self-pay | Admitting: Nurse Practitioner

## 2018-02-01 VITALS — BP 110/80 | HR 67 | Ht 77.0 in | Wt 334.0 lb

## 2018-02-01 DIAGNOSIS — Z880 Allergy status to penicillin: Secondary | ICD-10-CM | POA: Diagnosis not present

## 2018-02-01 DIAGNOSIS — Z9884 Bariatric surgery status: Secondary | ICD-10-CM | POA: Diagnosis not present

## 2018-02-01 DIAGNOSIS — Z833 Family history of diabetes mellitus: Secondary | ICD-10-CM | POA: Insufficient documentation

## 2018-02-01 DIAGNOSIS — G4733 Obstructive sleep apnea (adult) (pediatric): Secondary | ICD-10-CM | POA: Insufficient documentation

## 2018-02-01 DIAGNOSIS — R9431 Abnormal electrocardiogram [ECG] [EKG]: Secondary | ICD-10-CM | POA: Insufficient documentation

## 2018-02-01 DIAGNOSIS — I5032 Chronic diastolic (congestive) heart failure: Secondary | ICD-10-CM | POA: Insufficient documentation

## 2018-02-01 DIAGNOSIS — Z7901 Long term (current) use of anticoagulants: Secondary | ICD-10-CM | POA: Diagnosis not present

## 2018-02-01 DIAGNOSIS — Z6839 Body mass index (BMI) 39.0-39.9, adult: Secondary | ICD-10-CM | POA: Insufficient documentation

## 2018-02-01 DIAGNOSIS — Z7984 Long term (current) use of oral hypoglycemic drugs: Secondary | ICD-10-CM | POA: Insufficient documentation

## 2018-02-01 DIAGNOSIS — Z953 Presence of xenogenic heart valve: Secondary | ICD-10-CM | POA: Diagnosis not present

## 2018-02-01 DIAGNOSIS — Z79899 Other long term (current) drug therapy: Secondary | ICD-10-CM | POA: Diagnosis not present

## 2018-02-01 DIAGNOSIS — Z87891 Personal history of nicotine dependence: Secondary | ICD-10-CM | POA: Insufficient documentation

## 2018-02-01 DIAGNOSIS — Z8719 Personal history of other diseases of the digestive system: Secondary | ICD-10-CM | POA: Diagnosis not present

## 2018-02-01 DIAGNOSIS — K219 Gastro-esophageal reflux disease without esophagitis: Secondary | ICD-10-CM | POA: Insufficient documentation

## 2018-02-01 DIAGNOSIS — E1140 Type 2 diabetes mellitus with diabetic neuropathy, unspecified: Secondary | ICD-10-CM | POA: Diagnosis not present

## 2018-02-01 DIAGNOSIS — Z96652 Presence of left artificial knee joint: Secondary | ICD-10-CM | POA: Diagnosis not present

## 2018-02-01 DIAGNOSIS — I110 Hypertensive heart disease with heart failure: Secondary | ICD-10-CM | POA: Insufficient documentation

## 2018-02-01 DIAGNOSIS — M109 Gout, unspecified: Secondary | ICD-10-CM | POA: Insufficient documentation

## 2018-02-01 DIAGNOSIS — I4819 Other persistent atrial fibrillation: Secondary | ICD-10-CM | POA: Diagnosis not present

## 2018-02-01 DIAGNOSIS — I4891 Unspecified atrial fibrillation: Secondary | ICD-10-CM | POA: Diagnosis present

## 2018-02-01 DIAGNOSIS — Z886 Allergy status to analgesic agent status: Secondary | ICD-10-CM | POA: Diagnosis not present

## 2018-02-01 LAB — MAGNESIUM: Magnesium: 2 mg/dL (ref 1.7–2.4)

## 2018-02-01 NOTE — Progress Notes (Signed)
Primary Care Physician: [name], MD Primary Cardiologist: Antoine Poche Primary Electrophysiologist: Philip [name] is a 57 y.o. male with a history of persistent atrial fibrillation who presents for follow up in the [facility].  Since discharge for Tikosyn load the patient reports doing very well.  His energy level has improved. He usually  takes  Metolazone once weekly. The week before he lost 14 lbs in 2 days after taking drug. Last week did not see any weight loss. He takes an extra K+ when he takes Metolazone.  He has not noted any afib.  Today, he  denies symptoms of palpitations, chest pain, shortness of breath, orthopnea, PND, lower extremity edema, dizziness, presyncope, syncope, snoring, daytime somnolence, bleeding, or neurologic sequela. The patient is tolerating medications without difficulties and is otherwise without complaint today.    Atrial Fibrillation Risk Factors:  he does have symptoms or diagnosis of sleep apnea. he is compliant with CPAP therapy.  he does not have a history of rheumatic fever.  he has a BMI of Body mass index is 39.61 kg/m.Marland Kitchen Filed Weights   02/01/18 0830  Weight: (!) 151.5 kg    LA size: 46   Atrial Fibrillation Management history:  Previous antiarrhythmic drugs: Flecainide -> Tikosyn  Previous cardioversions: 07/2017, 08/2017  Previous ablations: none  CHADS2VASC score: 2  Anticoagulation history: Xarelto   Past Medical History:  Diagnosis Date  . Arthritis   . Dental crowns present   . Diabetes mellitus    NIDDM  . Diabetic foot ulcer (HCC) 08/2011   right foot  . Fatty liver   . Gastric ulcer    no current problems  . GERD (gastroesophageal reflux disease)   . Gout   . 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 Hx of bacterial endocarditis 11/2003  . Hypertension   . Neuropathy    feet bilat    . Obesity   . Persistent atrial fibrillation   . Sleep apnea    uses CPAP nightly  . Toe deformity 08/2011   right claw hallux   Past Surgical History:  Procedure Laterality Date  . adenoid surgery     . AORTIC VALVE REPLACEMENT  11/2003  . CARDIOVERSION N/A 07/25/2017   Procedure: CARDIOVERSION;  Surgeon: Chilton Si, MD;  Location: [facility] ENDOSCOPY;  Service: Cardiovascular;  Laterality: N/A;  . CARDIOVERSION N/A 09/07/2017   Procedure: CARDIOVERSION;  Surgeon: Chrystie Nose, MD;  Location: [facility] ENDOSCOPY;  Service: Cardiovascular;  Laterality: N/A;  . CARDIOVERSION N/A 09/29/2017   Procedure: CARDIOVERSION;  Surgeon: Thurmon Fair, MD;  Location: [facility] ENDOSCOPY;  Service: Cardiovascular;  Laterality: N/A;  . COLONOSCOPY WITH PROPOFOL N/A 03/30/2017   Procedure: COLONOSCOPY WITH PROPOFOL;  Surgeon: Benancio Deeds, MD;  Location: [facility] ENDOSCOPY;  Service: Gastroenterology;  Laterality: N/A;  . ESOPHAGOGASTRODUODENOSCOPY (EGD) WITH PROPOFOL N/A 03/29/2017   Procedure: ESOPHAGOGASTRODUODENOSCOPY (EGD) WITH PROPOFOL;  Surgeon: Benancio Deeds, MD;  Location: [facility] ENDOSCOPY;  Service: Gastroenterology;  Laterality: N/A;  . GASTRIC BYPASS  03/2004  . GIVENS CAPSULE STUDY N/A 03/30/2017   Procedure: GIVENS CAPSULE STUDY;  Surgeon: Benancio Deeds, MD;  Location: [facility] ENDOSCOPY;  Service: Gastroenterology;  Laterality: N/A;  . KNEE ARTHROSCOPY  08/09/2007 - left   07/15/2003 - right  . METATARSAL OSTEOTOMY  03/20/2010   right 1st MT; gastroc soleus recession  . NASAL SINUS SURGERY    .  TENDON RELEASE  09/09/2011   Procedure: HEEL CORD LENGTHENING;  Surgeon: Toni Arthurs, MD;  Location: [facility];  Service: Orthopedics;  Laterality: Right;  Righ tachilles tendon lengthening  . TOE FUSION  07/12/2005   left 3rd toe PIP and DIP fusion  . TONSILLECTOMY AND ADENOIDECTOMY    . TOTAL KNEE ARTHROPLASTY Left 03/18/2015   Procedure: LEFT TOTAL KNEE ARTHROPLASTY;  Surgeon:  Durene Romans, MD;  Location: [facility] ORS;  Service: Orthopedics;  Laterality: Left;    Current Outpatient Medications  Medication Sig Dispense Refill  . Ascorbic Acid (VITAMIN C) 1000 MG tablet Take 1,000 mg by mouth daily.    . B Complex-C (SUPER B COMPLEX PO) Take 1 tablet by mouth daily.     . Calcium Carb-Cholecalciferol (CALCIUM 600/VITAMIN D3 PO) Take 1 tablet by mouth 2 (two) times daily.    . cetirizine (ZYRTEC ALLERGY) 10 MG tablet Take 10 mg by mouth daily.     Marland Kitchen dofetilide (TIKOSYN) 250 MCG capsule Take 1 capsule (250 mcg total) by mouth 2 (two) times daily. 180 capsule 3  . esomeprazole (NEXIUM) 40 MG capsule Take 40 mg by mouth every evening.     . furosemide (LASIX) 40 MG tablet Take 1 tablet (40 mg total) by mouth daily. 30 tablet 6  . losartan (COZAAR) 25 MG tablet Take 25 mg by mouth every evening.     . Melatonin 3 MG CAPS Take 3 mg by mouth at bedtime.     . metolazone (ZAROXOLYN) 5 MG tablet Take 5 mg by mouth once a week.    . metoprolol succinate (TOPROL-XL) 50 MG 24 hr tablet Take 1 tablet (50 mg total) by mouth daily. Take with or immediately following a meal. (Patient taking differently: Take 50 mg by mouth daily with supper. Take with or immediately following a meal.) 90 tablet 3  . potassium chloride SA (K-DUR,KLOR-CON) 20 MEQ tablet Take 1 tablet (20 mEq total) by mouth daily. (Patient taking differently: Take 20 mEq by mouth daily. 1 extra tablet on days taking metolazone) 90 tablet 2  . simvastatin (ZOCOR) 10 MG tablet Take 10 mg by mouth every evening.     . sitaGLIPtin-metformin (JANUMET) 50-1000 MG tablet Take 1 tablet by mouth 2 (two) times daily with a meal.     . XARELTO 20 MG TABS tablet TAKE 1 TABLET DAILY WITH   SUPPER 90 tablet 1  . NON FORMULARY Apply 1 application topically daily. Triamcinolone-Eucerin Compound Cream    . PRESCRIPTION MEDICATION Inject 1 Dose as directed. Cortisone Shot in Right Shoulder Joint.     No current facility-administered  medications for this encounter.     Allergies  Allergen Reactions  . Nsaids Swelling and Other (See Comments)  . Penicillins Hives, Swelling and Other (See Comments)    NO REACTION LISTED Has patient had a PCN reaction causing immediate rash, facial/tongue/throat swelling, SOB or lightheadedness with hypotension: Yes Has patient had a PCN reaction causing severe rash involving mucus membranes or skin necrosis: No Has patient had a PCN reaction that required hospitalization No Has patient had a PCN reaction occurring within the last 10 years: No If all of the above answers are "NO", then may proceed with Cephalosporin use.  Has taken Keflex w/o issue      . Other Other (See Comments)    CYCLOOXYGENASE INHIBITORS-NO REACTION LISTED.    Social History   Socioeconomic History  . Marital status: Married    Spouse name: Not on  file  . Number of children: Not on file  . Years of education: Not on file  . Highest education level: Not on file  Occupational History  . Not on file  Social Needs  . Financial resource strain: Not on file  . Food insecurity:    Worry: Not on file    Inability: Not on file  . Transportation needs:    Medical: Not on file    Non-medical: Not on file  Tobacco Use  . Smoking status: Former Smoker    Packs/day: 1.00    Years: 2.00    Pack years: 2.00    Types: Cigars, Pipe, Cigarettes    Last attempt to quit: 01/31/2011    Years since quitting: 7.0  . Smokeless tobacco: Never Used  . Tobacco comment: smokes 1-2 cigars/week  Substance and Sexual Activity  . Alcohol use: Not Currently    Alcohol/week: 0.0 standard drinks  . Drug use: No  . Sexual activity: Not on file  Lifestyle  . Physical activity:    Days per week: Not on file    Minutes per session: Not on file  . Stress: Not on file  Relationships  . Social connections:    Talks on phone: Not on file    Gets together: Not on file    Attends religious service: Not on file    Active  member of club or organization: Not on file    Attends meetings of clubs or organizations: Not on file    Relationship status: Not on file  . Intimate partner violence:    Fear of current or ex partner: Not on file    Emotionally abused: Not on file    Physically abused: Not on file    Forced sexual activity: Not on file  Other Topics Concern  . Not on file  Social History Narrative  . Not on file    Family History  Problem Relation Age of Onset  . Alzheimer's disease Mother   . Diabetes Father   . Colon cancer Unknown        family history    ROS- All systems are reviewed and negative except as per the HPI above.  Physical Exam: Vitals:   02/01/18 0830  BP: 110/80  Pulse: 67  Weight: (!) 151.5 kg  Height: 6\' 5"  (1.956 m)    GEN- The patient is well appearing, alert and oriented x 3 today.   Head- normocephalic, atraumatic Eyes-  Sclera clear, conjunctiva pink Ears- hearing intact Oropharynx- clear Neck- supple  Lungs- Clear to ausculation bilaterally, normal work of breathing Heart- Regular rate and rhythm  GI- soft, NT, ND, + BS Extremities- no clubbing, cyanosis, or edema MS- no significant deformity or atrophy Skin- no rash or lesion Psych- euthymic mood, full affect Neuro- strength and sensation are intact  Wt Readings from Last 3 Encounters:  02/01/18 (!) 151.5 kg  10/26/17 (!) 147.9 kg  10/05/17 (!) 151 kg    EKG today demonstrates sinus rhythm, rate 60, QTc 496 msec( reviewed with [name] and he does not feel any med changes are needed)  Epic records are reviewed at length today  Assessment and Plan:  1. Persistent atrial fibrillation Maintaining SR on Tikosyn Continue Xarelto for CHADS2VASC of 2 QTc  Appears  slightly longer than last EKG but [name] reviewed and OK with it Mag today at 2.0, Kt checked last in care everywhere 10/14 with K+ at 4.5 and creatinine at 1.03 Denies any new  drugs  2.  Morbid obesity Body mass index is 39.61  kg/m. Weight loss encouarged  3.  OSA Compliant with CPAP  4.  Acute on chronic diastolic heart failure Improving with resumption of Metolazone  5.  S/p bioprosthetic AVR Stable by echo 12/2016 Prior endocarditis  Follow up with Dr Antoine Poche  as scheduled 10/15 afib clinic 04/2018  Philip Sidle. Matthew Folks [facility] [address] [place], [place] [address] [phone]

## 2018-02-13 NOTE — Progress Notes (Signed)
Cardiology Office Note   Date:  02/14/2018   ID:  Philip [name], DOB 1960/12/09, MRN [id number]  PCP:  [name], MD  Cardiologist:   [name], MD   No chief complaint on file.     History of Present Illness: Philip [name] is a 57 y.o. male who presents for follow up of AVR and atrial fib. The patient had a bioprosthetic aortic valve replacement in 2005.  He had a follow up echo in Sept of last year.  This showed a NL bioprosthesis.  In December he was in the hospital with anemia and GI bleeding.  He had questionable AVMs seen on capsule endoscopy.  He did have some volume overload and was treated with IV Lasix and metolazone.  He was transferred to [facility] for a "deep" enteroscopy but this could not be completed.  The stomach was not visualized as I read it.   He did have atrial  fib while at [facility].  He eventually was restarted on Xarelto .   After the last visit I sent him for DCCV and he did not convert.  He has since been treated with Tikosyn.  Followed by another cardioversion.   Since I last saw him he is done well.  He feels well.  He is a little trouble with a hammertoe and has not been exercising.  His weights have gone up a little bit.  However, he feels well.  He does not feel like he is been in atrial fibrillation.  He denies any chest pressure, neck or arm discomfort.  He said no palpitations, presyncope or syncope.  He denies any PND orthopnea   Past Medical History:  Diagnosis Date  . Arthritis   . Dental crowns present   . Diabetes mellitus    NIDDM  . Diabetic foot ulcer (HCC) 08/2011   right foot  . Fatty liver   . Gastric ulcer    no current problems  . GERD (gastroesophageal reflux disease)   . Gout   . 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 Hx of bacterial endocarditis 11/2003  . Hypertension   . Neuropathy    feet bilat   . Obesity   . Persistent atrial  fibrillation   . Sleep apnea    uses CPAP nightly  . 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 adenoid surgery     . AORTIC VALVE REPLACEMENT  11/2003  . CARDIOVERSION N/A 07/25/2017   Procedure: CARDIOVERSION;  Surgeon: Chilton Si, MD;  Location: [facility] ENDOSCOPY;  Service: Cardiovascular;  Laterality: N/A;  . CARDIOVERSION N/A 09/07/2017   Procedure: CARDIOVERSION;  Surgeon: Chrystie Nose, MD;  Location: [facility] ENDOSCOPY;  Service: Cardiovascular;  Laterality: N/A;  . CARDIOVERSION N/A 09/29/2017   Procedure: CARDIOVERSION;  Surgeon: Thurmon Fair, MD;  Location: [facility] ENDOSCOPY;  Service: Cardiovascular;  Laterality: N/A;  . COLONOSCOPY WITH PROPOFOL N/A 03/30/2017   Procedure: COLONOSCOPY WITH PROPOFOL;  Surgeon: Benancio Deeds, MD;  Location: [facility] ENDOSCOPY;  Service: Gastroenterology;  Laterality: N/A;  . ESOPHAGOGASTRODUODENOSCOPY (EGD) WITH PROPOFOL N/A 03/29/2017   Procedure: ESOPHAGOGASTRODUODENOSCOPY (EGD) WITH PROPOFOL;  Surgeon: Benancio Deeds, MD;  Location: [facility] ENDOSCOPY;  Service: Gastroenterology;  Laterality: N/A;  . GASTRIC BYPASS  03/2004  . GIVENS CAPSULE STUDY N/A 03/30/2017   Procedure: GIVENS CAPSULE STUDY;  Surgeon: Benancio Deeds, MD;  Location: [facility] ENDOSCOPY;  Service: Gastroenterology;  Laterality: N/A;  . KNEE ARTHROSCOPY  08/09/2007 - left   07/15/2003 - right  . METATARSAL OSTEOTOMY  03/20/2010   right 1st MT; gastroc soleus recession  . NASAL SINUS SURGERY    . TENDON RELEASE  09/09/2011   Procedure: HEEL CORD LENGTHENING;  Surgeon: Toni Arthurs, MD;  Location: [facility];  Service: Orthopedics;  Laterality: Right;  Righ tachilles tendon lengthening  . TOE FUSION  07/12/2005   left 3rd toe PIP and DIP fusion  . TONSILLECTOMY AND ADENOIDECTOMY    . TOTAL KNEE ARTHROPLASTY Left 03/18/2015   Procedure: LEFT TOTAL KNEE ARTHROPLASTY;  Surgeon: Durene Romans, MD;  Location: [facility] ORS;   Service: Orthopedics;  Laterality: Left;     Current Outpatient Medications  Medication Sig Dispense Refill  . Ascorbic Acid (VITAMIN C) 1000 MG tablet Take 1,000 mg by mouth daily.    . B Complex-C (SUPER B COMPLEX PO) Take 1 tablet by mouth daily.     . Calcium Carb-Cholecalciferol (CALCIUM 600/VITAMIN D3 PO) Take 1 tablet by mouth 2 (two) times daily.    . cetirizine (ZYRTEC ALLERGY) 10 MG tablet Take 10 mg by mouth daily.     Marland Kitchen dofetilide (TIKOSYN) 250 MCG capsule Take 1 capsule (250 mcg total) by mouth 2 (two) times daily. 180 capsule 3  . esomeprazole (NEXIUM) 40 MG capsule Take 40 mg by mouth every evening.     . furosemide (LASIX) 40 MG tablet Take 1 tablet (40 mg total) by mouth daily. 30 tablet 6  . losartan (COZAAR) 25 MG tablet Take 25 mg by mouth every evening.     . Melatonin 3 MG CAPS Take 3 mg by mouth at bedtime.     . metolazone (ZAROXOLYN) 5 MG tablet Take 5 mg by mouth once a week.    . NON FORMULARY Apply 1 application topically daily. Triamcinolone-Eucerin Compound Cream    . potassium chloride SA (K-DUR,KLOR-CON) 20 MEQ tablet Take 1 tablet (20 mEq total) by mouth daily. (Patient taking differently: Take 20 mEq by mouth daily. 1 extra tablet on days taking metolazone) 90 tablet 2  . PRESCRIPTION MEDICATION Inject 1 Dose as directed. Cortisone Shot in Right Shoulder Joint.    . simvastatin (ZOCOR) 10 MG tablet Take 10 mg by mouth every evening.     . sitaGLIPtin-metformin (JANUMET) 50-1000 MG tablet Take 1 tablet by mouth 2 (two) times daily with a meal.     . XARELTO 20 MG TABS tablet TAKE 1 TABLET DAILY WITH   SUPPER 90 tablet 1  . metoprolol succinate (TOPROL-XL) 50 MG 24 hr tablet Take 1 tablet (50 mg total) by mouth daily. Take with or immediately following a meal. (Patient taking differently: Take 50 mg by mouth daily with supper. Take with or immediately following a meal.) 90 tablet 3   No current facility-administered medications for this visit.     Allergies:    Nsaids; Penicillins; and Other    ROS:  Please see the history of present illness.   Otherwise, review of systems are positive for none.   All other systems are reviewed and negative.    PHYSICAL EXAM: VS:  BP 102/70   Pulse 72   Ht 6\' 5"  (1.956 m)   Wt (!) 337 lb (152.9 kg)   BMI 39.96 kg/m  , BMI Body mass index is 39.96 kg/m. GENERAL:  Well appearing HEENT:  Pupils equal round  and reactive, fundi not visualized, oral mucosa unremarkable NECK:  No jugular venous distention, waveform within normal limits, carotid upstroke brisk and symmetric, no bruits, no thyromegaly LUNGS:  Clear to auscultation bilaterally CHEST:  Well healed sternotomy scar. HEART:  PMI not displaced or sustained,S1 and S2 within normal limits, no S3, no S4, no clicks, no rubs, no murmurs ABD:  Flat, positive bowel sounds normal in frequency in pitch, no bruits, no rebound, no guarding, no midline pulsatile mass, no hepatomegaly, no splenomegaly EXT:  2 plus pulses throughout, mild edema, no cyanosis no clubbing, varicose veins    EKG:  EKG is not ordered today. The ekg ordered today demonstrates none   Recent Labs: 03/29/2017: ALT 16 07/14/2017: TSH 4.050 09/02/2017: Hemoglobin 11.5; Platelets 170 10/05/2017: BUN 16; Creatinine, Ser 1.09; Potassium 3.9; Sodium 141 02/01/2018: Magnesium 2.0    Lipid Panel No results found for: CHOL, TRIG, HDL, CHOLHDL, VLDL, LDLCALC, LDLDIRECT    Wt Readings from Last 3 Encounters:  02/14/18 (!) 337 lb (152.9 kg)  02/01/18 (!) 334 lb (151.5 kg)  10/26/17 (!) 326 lb (147.9 kg)      Other studies Reviewed: Additional studies/ records that were reviewed today include:   Labs, Atrial Fib Office Records Review of the above records demonstrates:  Please see elsewhere in the note.     ASSESSMENT AND PLAN:  ATRIAL FIB:  Mr. ZAKAI GONYEA has a CHA2DS2 - VASc score of 2.   He seems to be maintaining sinus rhythm.  Tolerates anticoagulation.  His hemoglobin was up to  13.3.  No change in therapy.  MITRAL REPAIR:  He had this repaired and has only mild MR in Sept of last year.  No change in therapy or further imaging is indicated.  AVR: This was stable in Sept. as above.  HTN: The blood pressure is well controlled.  Continue with the meds as listed.  OBESITY: We talked about weight control and he is going to start exercising now that his foot is improved.    Current medicines are reviewed at length with the patient today.  The patient does not have concerns regarding medicines.  The following changes have been made:  no change  Labs/ tests ordered today include: None No orders of the defined types were placed in this encounter.    Disposition:   FU with Atrial Fib clinic in six months.     Signed, Rollene Rotunda, MD  02/14/2018 1:45 PM    [facility]  '

## 2018-02-14 ENCOUNTER — Ambulatory Visit (INDEPENDENT_AMBULATORY_CARE_PROVIDER_SITE_OTHER): Payer: BLUE CROSS/BLUE SHIELD | Admitting: Cardiology

## 2018-02-14 ENCOUNTER — Encounter: Payer: Self-pay | Admitting: Cardiology

## 2018-02-14 VITALS — BP 102/70 | HR 72 | Ht 77.0 in | Wt 337.0 lb

## 2018-02-14 DIAGNOSIS — I10 Essential (primary) hypertension: Secondary | ICD-10-CM

## 2018-02-14 DIAGNOSIS — I340 Nonrheumatic mitral (valve) insufficiency: Secondary | ICD-10-CM | POA: Diagnosis not present

## 2018-02-14 DIAGNOSIS — I4819 Other persistent atrial fibrillation: Secondary | ICD-10-CM

## 2018-02-14 NOTE — Patient Instructions (Signed)
Medication Instructions:  Continue current medications  If you need a refill on your cardiac medications before your next appointment, please call your pharmacy.  Labwork: None Ordered HERE  If you have labs (blood work) drawn today and your tests are completely normal, you will receive your results only by: Marland Kitchen MyChart Message (if you have MyChart) OR . A paper copy in the mail If you have any lab test that is abnormal or we need to change your treatment, we will call you to review the results.  Testing/Procedures: None Ordered  Follow-Up: You will need a follow up appointment in 1 Year.  Please call our office 2 months in [phone]) to schedule the appointment.  You may see  DR Antoine Poche or one of the following Advanced Practice Providers on your designated Care Team:   . Joni Reining, DNP, ANP . Rhonda Barrett, PA-C .  Marland Kitchen Corine Shelter, PA-C . Dot Lanes Kroeger, PA-C . Marjie Skiff, PA-C .  Marland Kitchen Azalee Course, PA-C . Micah Flesher, PA-C  At [facility], you and your health needs are our priority.  As part of our continuing mission to provide you with exceptional heart care, we have created designated Provider Care Teams.  These Care Teams include your primary Cardiologist (physician) and Advanced Practice Providers (APPs -  Physician Assistants and Nurse Practitioners) who all work together to provide you with the care you need, when you need it.   Thank you for choosing [facility] at [facility]!!

## 2018-02-20 ENCOUNTER — Other Ambulatory Visit: Payer: Self-pay | Admitting: Internal Medicine

## 2018-04-05 ENCOUNTER — Ambulatory Visit (HOSPITAL_COMMUNITY)
Admission: RE | Admit: 2018-04-05 | Discharge: 2018-04-05 | Disposition: A | Discharge status: Home / Self Care | Payer: BLUE CROSS/BLUE SHIELD | Source: Ambulatory Visit | Attending: Nurse Practitioner | Admitting: Nurse Practitioner

## 2018-04-05 ENCOUNTER — Encounter (HOSPITAL_COMMUNITY): Payer: Self-pay | Admitting: Nurse Practitioner

## 2018-04-05 VITALS — BP 112/62 | HR 64 | Ht 77.0 in | Wt 336.0 lb

## 2018-04-05 DIAGNOSIS — G4733 Obstructive sleep apnea (adult) (pediatric): Secondary | ICD-10-CM | POA: Diagnosis not present

## 2018-04-05 DIAGNOSIS — Z9884 Bariatric surgery status: Secondary | ICD-10-CM | POA: Insufficient documentation

## 2018-04-05 DIAGNOSIS — Z79899 Other long term (current) drug therapy: Secondary | ICD-10-CM | POA: Diagnosis not present

## 2018-04-05 DIAGNOSIS — I4819 Other persistent atrial fibrillation: Secondary | ICD-10-CM | POA: Diagnosis not present

## 2018-04-05 DIAGNOSIS — I110 Hypertensive heart disease with heart failure: Secondary | ICD-10-CM | POA: Diagnosis not present

## 2018-04-05 DIAGNOSIS — Z7901 Long term (current) use of anticoagulants: Secondary | ICD-10-CM | POA: Insufficient documentation

## 2018-04-05 DIAGNOSIS — M1990 Unspecified osteoarthritis, unspecified site: Secondary | ICD-10-CM | POA: Insufficient documentation

## 2018-04-05 DIAGNOSIS — Z87891 Personal history of nicotine dependence: Secondary | ICD-10-CM | POA: Insufficient documentation

## 2018-04-05 DIAGNOSIS — E1140 Type 2 diabetes mellitus with diabetic neuropathy, unspecified: Secondary | ICD-10-CM | POA: Diagnosis not present

## 2018-04-05 DIAGNOSIS — I5033 Acute on chronic diastolic (congestive) heart failure: Secondary | ICD-10-CM | POA: Insufficient documentation

## 2018-04-05 DIAGNOSIS — Z8719 Personal history of other diseases of the digestive system: Secondary | ICD-10-CM | POA: Insufficient documentation

## 2018-04-05 DIAGNOSIS — M109 Gout, unspecified: Secondary | ICD-10-CM | POA: Diagnosis not present

## 2018-04-05 DIAGNOSIS — K760 Fatty (change of) liver, not elsewhere classified: Secondary | ICD-10-CM | POA: Insufficient documentation

## 2018-04-05 DIAGNOSIS — Z953 Presence of xenogenic heart valve: Secondary | ICD-10-CM | POA: Insufficient documentation

## 2018-04-05 DIAGNOSIS — Z6839 Body mass index (BMI) 39.0-39.9, adult: Secondary | ICD-10-CM | POA: Diagnosis not present

## 2018-04-05 DIAGNOSIS — K219 Gastro-esophageal reflux disease without esophagitis: Secondary | ICD-10-CM | POA: Insufficient documentation

## 2018-04-05 NOTE — Progress Notes (Signed)
Primary Care Physician: [name], MD Primary Cardiologist: Antoine Poche Primary Electrophysiologist: Graylon Gunning Philip [name] is a 57 y.o. male with a history of persistent atrial fibrillation who presents for follow up in the [facility].  Since discharge for Tikosyn load in June 2019, the patient reports doing very well.  His energy level has improved. Weight is stable on usual diuretics.  He is planning a trip to [place] after Christmas.  He has not noted any afib.  Today, he  denies symptoms of palpitations, chest pain, shortness of breath, orthopnea, PND, lower extremity edema, dizziness, presyncope, syncope, snoring, daytime somnolence, bleeding, or neurologic sequela. The patient is tolerating medications without difficulties and is otherwise without complaint today.    Atrial Fibrillation Risk Factors:  he does have symptoms or diagnosis of sleep apnea. he is compliant with CPAP therapy.  he does not have a history of rheumatic fever.  he has a BMI of Body mass index is 39.84 kg/m.Marland Kitchen Filed Weights   04/05/18 0833  Weight: (!) 152.4 kg    LA size: 46   Atrial Fibrillation Management history:  Previous antiarrhythmic drugs: Flecainide -> Tikosyn  Previous cardioversions: 07/2017, 08/2017  Previous ablations: none  CHADS2VASC score: 2  Anticoagulation history: Xarelto   Past Medical History:  Diagnosis Date  . Arthritis   . Dental crowns present   . Diabetes mellitus    NIDDM  . Diabetic foot ulcer (HCC) 08/2011   right foot  . Fatty liver   . Gastric ulcer    no current problems  . GERD (gastroesophageal reflux disease)   . Gout   . 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 Hx of bacterial endocarditis 11/2003  . Hypertension   . Neuropathy    feet bilat   . Obesity   . Persistent atrial fibrillation   . Sleep apnea    uses CPAP  nightly  . Toe deformity 08/2011   right claw hallux   Past Surgical History:  Procedure Laterality Date  . adenoid surgery     . AORTIC VALVE REPLACEMENT  11/2003  . CARDIOVERSION N/A 07/25/2017   Procedure: CARDIOVERSION;  Surgeon: Chilton Si, MD;  Location: [facility] ENDOSCOPY;  Service: Cardiovascular;  Laterality: N/A;  . CARDIOVERSION N/A 09/07/2017   Procedure: CARDIOVERSION;  Surgeon: Chrystie Nose, MD;  Location: [facility] ENDOSCOPY;  Service: Cardiovascular;  Laterality: N/A;  . CARDIOVERSION N/A 09/29/2017   Procedure: CARDIOVERSION;  Surgeon: Thurmon Fair, MD;  Location: [facility] ENDOSCOPY;  Service: Cardiovascular;  Laterality: N/A;  . COLONOSCOPY WITH PROPOFOL N/A 03/30/2017   Procedure: COLONOSCOPY WITH PROPOFOL;  Surgeon: Benancio Deeds, MD;  Location: [facility] ENDOSCOPY;  Service: Gastroenterology;  Laterality: N/A;  . ESOPHAGOGASTRODUODENOSCOPY (EGD) WITH PROPOFOL N/A 03/29/2017   Procedure: ESOPHAGOGASTRODUODENOSCOPY (EGD) WITH PROPOFOL;  Surgeon: Benancio Deeds, MD;  Location: [facility] ENDOSCOPY;  Service: Gastroenterology;  Laterality: N/A;  . GASTRIC BYPASS  03/2004  . GIVENS CAPSULE STUDY N/A 03/30/2017   Procedure: GIVENS CAPSULE STUDY;  Surgeon: Benancio Deeds, MD;  Location: [facility] ENDOSCOPY;  Service: Gastroenterology;  Laterality: N/A;  . KNEE ARTHROSCOPY  08/09/2007 - left   07/15/2003 - right  . METATARSAL OSTEOTOMY  03/20/2010   right 1st MT; gastroc soleus recession  . NASAL SINUS SURGERY    . TENDON RELEASE  09/09/2011   Procedure: HEEL CORD LENGTHENING;  Surgeon: Toni Arthurs,  MD;  Location: [facility];  Service: Orthopedics;  Laterality: Right;  Righ tachilles tendon lengthening  . TOE FUSION  07/12/2005   left 3rd toe PIP and DIP fusion  . TONSILLECTOMY AND ADENOIDECTOMY    . TOTAL KNEE ARTHROPLASTY Left 03/18/2015   Procedure: LEFT TOTAL KNEE ARTHROPLASTY;  Surgeon: Durene Romans, MD;  Location: [facility] ORS;  Service: Orthopedics;  Laterality: Left;     Current Outpatient Medications  Medication Sig Dispense Refill  . Ascorbic Acid (VITAMIN C) 1000 MG tablet Take 1,000 mg by mouth daily.    . B Complex-C (SUPER B COMPLEX PO) Take 1 tablet by mouth daily.     . Calcium Carb-Cholecalciferol (CALCIUM 600/VITAMIN D3 PO) Take 1 tablet by mouth 2 (two) times daily.    . cetirizine (ZYRTEC ALLERGY) 10 MG tablet Take 10 mg by mouth daily.     Marland Kitchen dofetilide (TIKOSYN) 250 MCG capsule TAKE 1 CAPSULE BY MOUTH TWICE DAILY 60 capsule 7  . esomeprazole (NEXIUM) 40 MG capsule Take 40 mg by mouth every evening.     . furosemide (LASIX) 40 MG tablet Take 1 tablet (40 mg total) by mouth daily. 30 tablet 6  . losartan (COZAAR) 25 MG tablet Take 25 mg by mouth every evening.     . Melatonin 3 MG CAPS Take 3 mg by mouth at bedtime.     . metolazone (ZAROXOLYN) 5 MG tablet Take 5 mg by mouth once a week.    . metoprolol succinate (TOPROL-XL) 50 MG 24 hr tablet Take 1 tablet (50 mg total) by mouth daily. Take with or immediately following a meal. (Patient taking differently: Take 50 mg by mouth daily with supper. Take with or immediately following a meal.) 90 tablet 3  . NON FORMULARY Apply 1 application topically daily. Triamcinolone-Eucerin Compound Cream    . potassium chloride SA (K-DUR,KLOR-CON) 20 MEQ tablet Take 1 tablet (20 mEq total) by mouth daily. (Patient taking differently: Take 20 mEq by mouth daily. 1 extra tablet on days taking metolazone) 90 tablet 2  . simvastatin (ZOCOR) 10 MG tablet Take 10 mg by mouth every evening.     . sitaGLIPtin-metformin (JANUMET) 50-1000 MG tablet Take 1 tablet by mouth 2 (two) times daily with a meal.     . XARELTO 20 MG TABS tablet TAKE 1 TABLET DAILY WITH   SUPPER 90 tablet 1  . PRESCRIPTION MEDICATION Inject 1 Dose as directed. Cortisone Shot in Right Shoulder Joint.     No current facility-administered medications for this encounter.     Allergies  Allergen Reactions  . Nsaids Swelling and Other (See Comments)   . Penicillins Hives, Swelling and Other (See Comments)    NO REACTION LISTED Has patient had a PCN reaction causing immediate rash, facial/tongue/throat swelling, SOB or lightheadedness with hypotension: Yes Has patient had a PCN reaction causing severe rash involving mucus membranes or skin necrosis: No Has patient had a PCN reaction that required hospitalization No Has patient had a PCN reaction occurring within the last 10 years: No If all of the above answers are "NO", then may proceed with Cephalosporin use.  Has taken Keflex w/o issue      . Other Other (See Comments)    CYCLOOXYGENASE INHIBITORS-NO REACTION LISTED.    Social History   Socioeconomic History  . Marital status: Married    Spouse name: Not on file  . Number of children: Not on file  . Years of education: Not on file  .  Highest education level: Not on file  Occupational History  . Not on file  Social Needs  . Financial resource strain: Not on file  . Food insecurity:    Worry: Not on file    Inability: Not on file  . Transportation needs:    Medical: Not on file    Non-medical: Not on file  Tobacco Use  . Smoking status: Former Smoker    Packs/day: 1.00    Years: 2.00    Pack years: 2.00    Types: Cigars, Pipe, Cigarettes    Last attempt to quit: 01/31/2011    Years since quitting: 7.1  . Smokeless tobacco: Never Used  . Tobacco comment: smokes 1-2 cigars/week  Substance and Sexual Activity  . Alcohol use: Not Currently    Alcohol/week: 0.0 standard drinks  . Drug use: No  . Sexual activity: Not on file  Lifestyle  . Physical activity:    Days per week: Not on file    Minutes per session: Not on file  . Stress: Not on file  Relationships  . Social connections:    Talks on phone: Not on file    Gets together: Not on file    Attends religious service: Not on file    Active member of club or organization: Not on file    Attends meetings of clubs or organizations: Not on file     Relationship status: Not on file  . Intimate partner violence:    Fear of current or ex partner: Not on file    Emotionally abused: Not on file    Physically abused: Not on file    Forced sexual activity: Not on file  Other Topics Concern  . Not on file  Social History Narrative  . Not on file    Family History  Problem Relation Age of Onset  . Alzheimer's disease Mother   . Diabetes Father   . Colon cancer Unknown        family history    ROS- All systems are reviewed and negative except as per the HPI above.  Physical Exam: Vitals:   04/05/18 0833  BP: 112/62  Pulse: 64  Weight: (!) 152.4 kg  Height: 6\' 5"  (1.956 m)    GEN- The patient is well appearing, alert and oriented x 3 today.   Head- normocephalic, atraumatic Eyes-  Sclera clear, conjunctiva pink Ears- hearing intact Oropharynx- clear Neck- [name]  Lungs- Clear to ausculation bilaterally, normal work of breathing Heart- Regular rate and rhythm  GI- soft, NT, ND, + BS Extremities- no clubbing, cyanosis, or edema MS- no significant deformity or atrophy Skin- no rash or lesion Psych- euthymic mood, full affect Neuro- strength and sensation are intact  Wt Readings from Last 3 Encounters:  04/05/18 (!) 152.4 kg  02/14/18 (!) 152.9 kg  02/01/18 (!) 151.5 kg    EKG today demonstrates sinus rhythm, rate 64, QTc 474 ms  K+and mag recently checked in normal range for tikosyn( care  everywhere and epic)  Epic records are reviewed at length today  Assessment and Plan:  1. Persistent atrial fibrillation Maintaining SR on Tikosyn Continue Xarelto for CHADS2VASC of 2 QTc stable  2.  Morbid obesity Body mass index is 39.84 kg/m. Weight loss encouarged  3.  OSA Compliant with CPAP  4.  Acute on chronic diastolic heart failure Stable with  Metolazone/lasix  5.  S/p bioprosthetic AVR Stable by echo 12/2016 Prior endocarditis  Follow up with Dr Antoine PocheHochrein  as scheduled  afib clinic in 4  months  Elvina Sidle. [facility] [address] [place], [place] [address] [phone]

## 2018-04-11 IMAGING — CT CT CTA ABD/PEL W/CM AND/OR W/O CM
2 of 9 series · 12 of 46 positions shown, 17 images · IV contrast (iopamidol)
Comparison: None.

CLINICAL DATA: 55-year-old with a GI bleed of unclear etiology.
Negative EGD. History of gastric bypass. Melena.

EXAM:
CT ANGIOGRAPHY ABDOMEN AND PELVIS WITH CONTRAST AND WITHOUT CONTRAST
TECHNIQUE: Multidetector CT imaging of the abdomen and pelvis was performed
using the standard protocol during bolus administration of
intravenous contrast. Multiplanar reconstructed images and MIPs were
obtained and reviewed to evaluate the vascular anatomy.
CONTRAST:  100mL [id number] IOPAMIDOL ([id number]) INJECTION 76%

[Series 7: axial venous · axial · portal-venous · 0.98mm/px · z∈[+1047,+1513]mm · 10 of 277 slices shown, 15 images]
[im 22/277  soft-tissue]
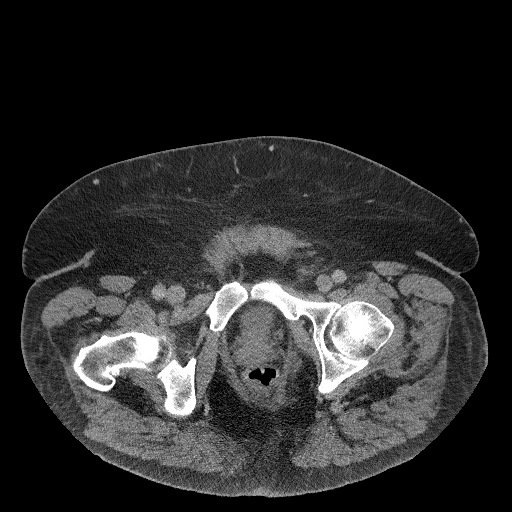
[im 22/277  bone]
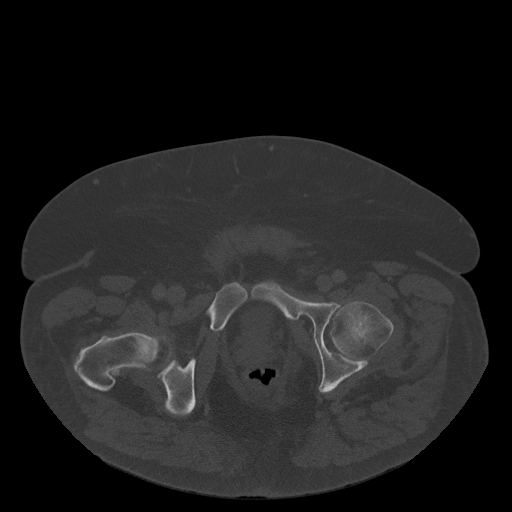
[im 64/277  soft-tissue]
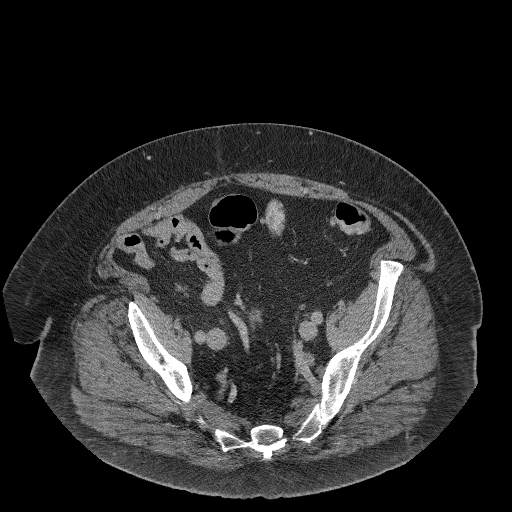
[im 85/277  soft-tissue]
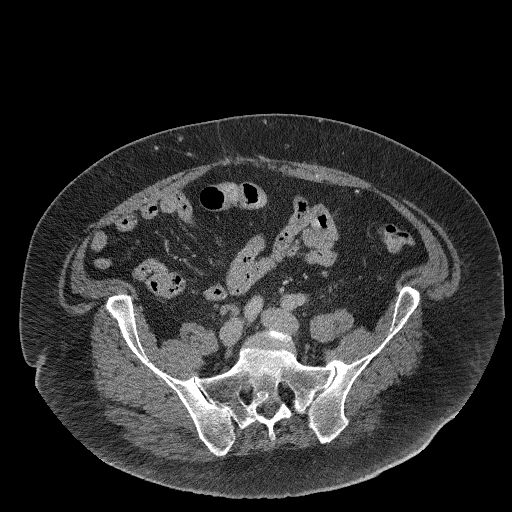
[im 107/277  soft-tissue]
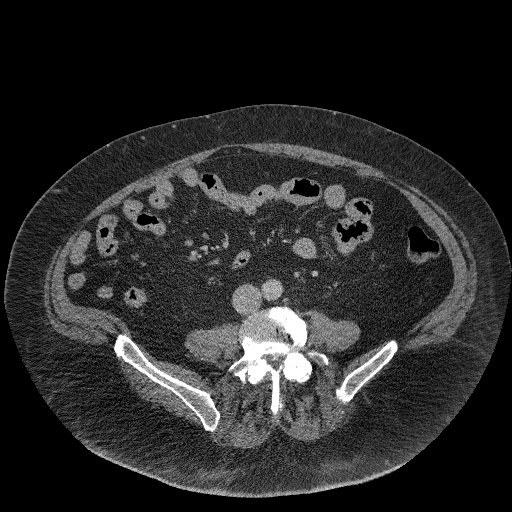
[im 149/277  soft-tissue]
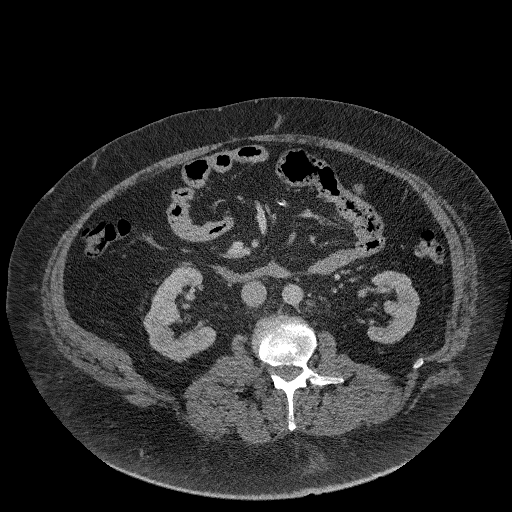
[im 170/277  soft-tissue]
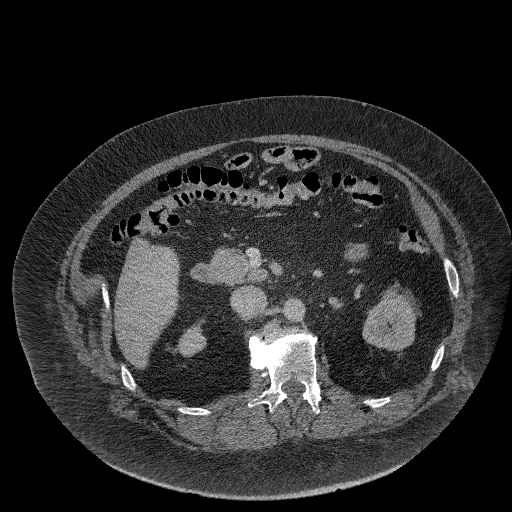
[im 192/277  soft-tissue]
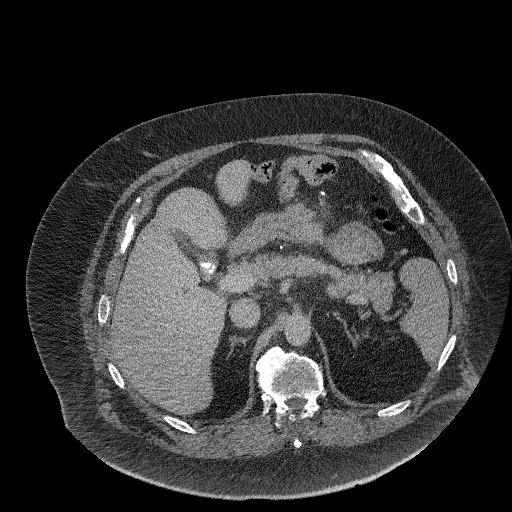
[im 192/277  lung]
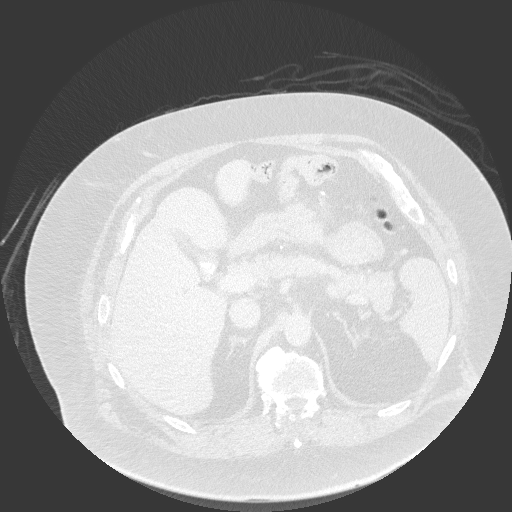
[im 213/277  lung]
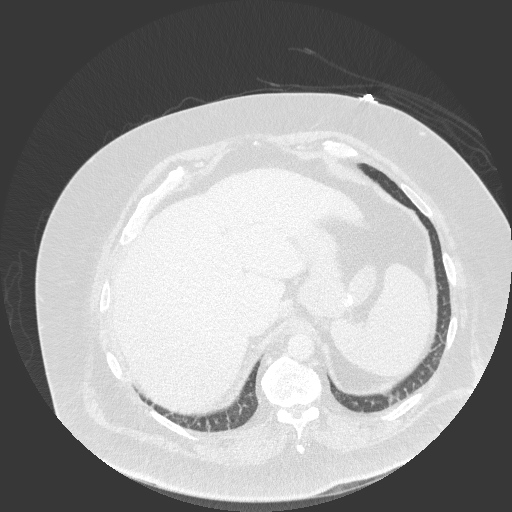
[im 234/277  soft-tissue]
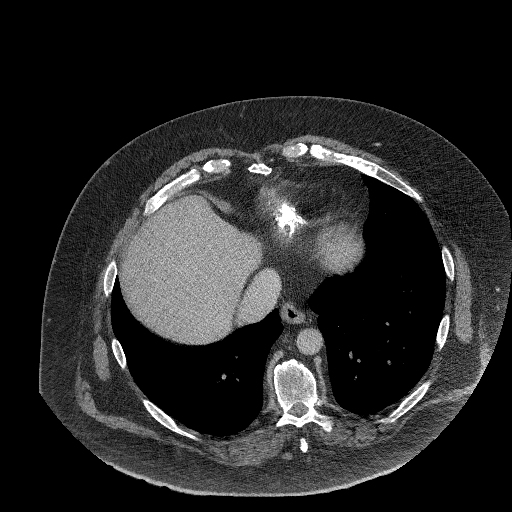
[im 234/277  lung]
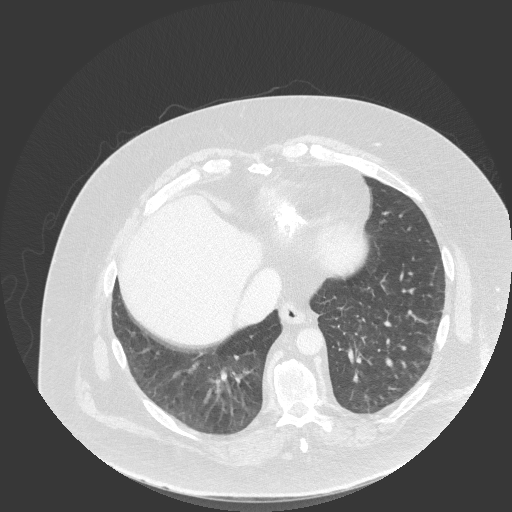
[im 255/277  soft-tissue]
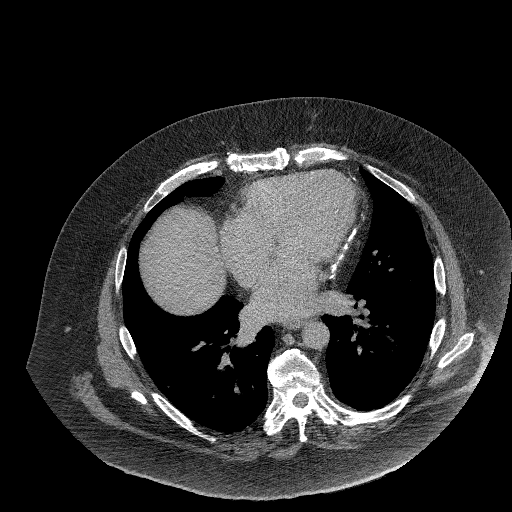
[im 255/277  lung]
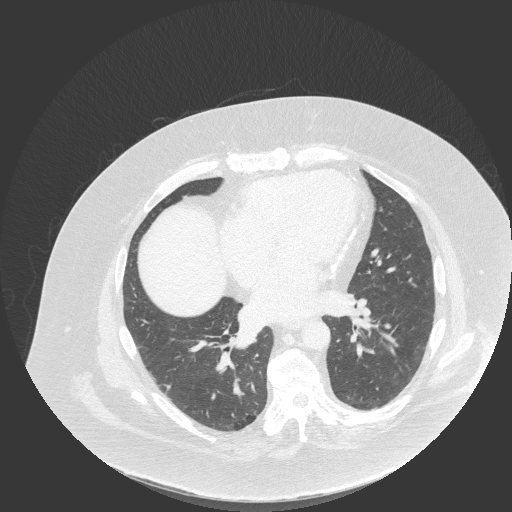
[im 255/277  bone]
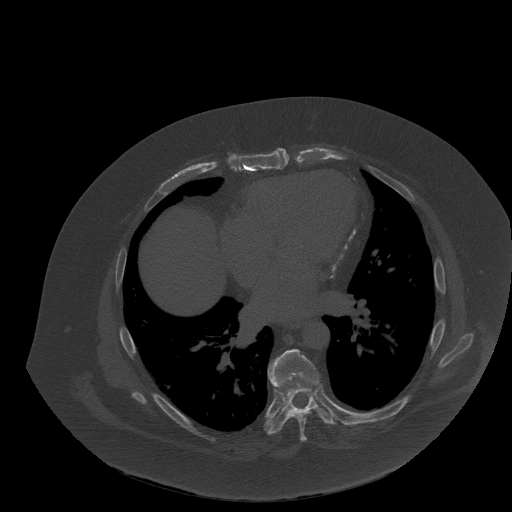

[Series 9: coronal mpr · coronal · 1.04mm/px · 2 of 193 slices shown]
[im 65/193  soft-tissue]
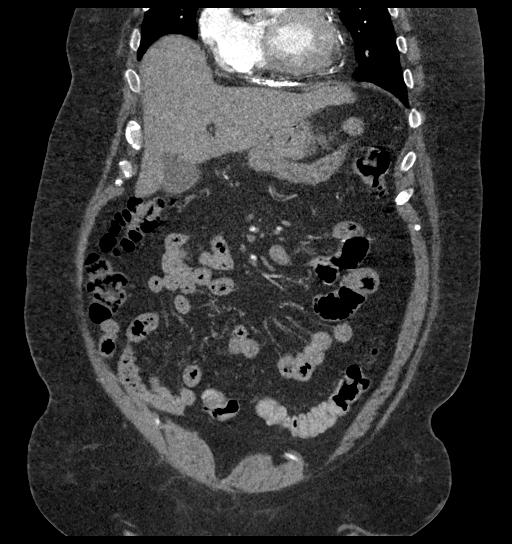
[im 129/193  soft-tissue]
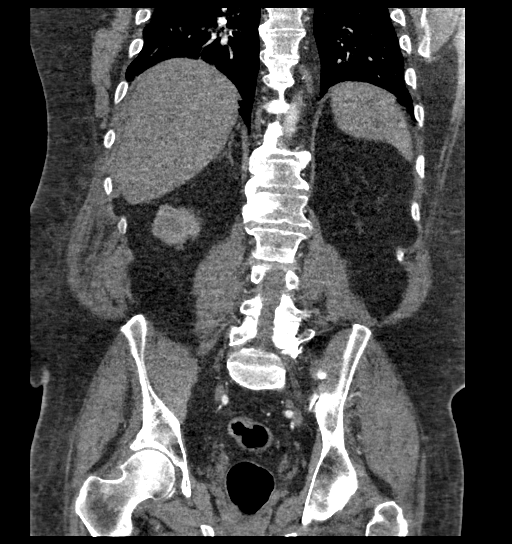

[12 of 46 positions shown; findings below may reference images not displayed]

FINDINGS: VASCULAR

Aorta: Normal caliber aorta without aneurysm, dissection, vasculitis
or significant stenosis.

Celiac: Patent without evidence of aneurysm, dissection, vasculitis
or significant stenosis.

SMA: Patent without evidence of aneurysm, dissection, vasculitis or
significant stenosis.

Renals: There are 2 renal arteries bilaterally. Bilateral renal
arteries are patent without evidence of aneurysm, dissection,
vasculitis, fibromuscular dysplasia or significant stenosis.

IMA: Patent without evidence of aneurysm, dissection, vasculitis or
significant stenosis.

Inflow: Patent without evidence of aneurysm, dissection, vasculitis
or significant stenosis.

Proximal Outflow: Proximal femoral arteries are patent bilaterally.

Veins: Portal venous system is patent. IVC and renal veins are
patent. No gross abnormality to the iliac veins. Incidentally, there
is a left retroaortic renal vein.

Review of the MIP images confirms the above findings.

NON-VASCULAR

Lower chest: Extensive calcifications throughout the pericardium.
Peripheral septal thickening at the lung bases. Probable air
trapping along the medial right lower lobe. No large pleural
effusions. Prior median sternotomy.

Hepatobiliary: Normal appearance of the liver. Multiple calcified
gallstones. No evidence for gallbladder inflammation or distension.

Pancreas: Normal appearance of the pancreas without inflammation or
duct dilatation.

Spleen: Normal appearance of spleen without enlargement.

Adrenals/Urinary Tract: Adrenal glands are normal. Urinary bladder
is normal without stones. Negative for hydronephrosis. Small amount
of perinephric edema along the left kidney upper pole. No suspicious
renal lesions.

Stomach/Bowel: Surgical changes compatible with a gastric bypass
with a Roux-en-Y gastrojejunostomy. Minimal stranding around the
gastrojejunostomy. There is no significant bowel distension. No
evidence for intravenous contrast within the bowel lumen. No large
aneurysms associated with the bowel structures. Normal appearance of
the large bowel and appendix. Normal appearance of the terminal
ileum.

Lymphatic: Small lymph nodes in the iliac chains bilaterally.
Overall, no significant lymph node enlargement.

Reproductive: Prostate is unremarkable.

Other: No ascites.  Negative free air.

Musculoskeletal: Extensive facet arthropathy in lower lumbar spine.
IMPRESSION: VASCULAR

GI bleeding source is NOT identified on this examination. Tagged red
blood cell bleeding scan would be another imaging option to look for
a bleeding source.

No significant atherosclerotic disease or stenosis involving the
aorta, iliac arteries are visceral arteries.

NON-VASCULAR

Postsurgical changes related to a gastric bypass procedure with a
Roux-en-Y gastrojejunostomy. Minimal stranding near the
gastrojejunostomy is likely postoperative change. No evidence for
bowel inflammation or obstruction.

Cholelithiasis. No evidence for gallbladder inflammation or biliary
dilatation.

## 2018-05-17 ENCOUNTER — Other Ambulatory Visit: Payer: Self-pay | Admitting: Cardiology

## 2018-05-17 ENCOUNTER — Other Ambulatory Visit (HOSPITAL_COMMUNITY): Payer: Self-pay | Admitting: Nurse Practitioner

## 2018-05-17 NOTE — Telephone Encounter (Signed)
Rx(s) sent to pharmacy electronically.  

## 2018-07-25 ENCOUNTER — Encounter (HOSPITAL_COMMUNITY): Payer: Self-pay | Admitting: *Deleted

## 2018-07-25 ENCOUNTER — Other Ambulatory Visit: Payer: Self-pay | Admitting: Cardiology

## 2018-07-25 NOTE — Telephone Encounter (Signed)
Zaroxolyn refilled.

## 2018-08-03 ENCOUNTER — Encounter (HOSPITAL_COMMUNITY): Payer: Self-pay | Admitting: Nurse Practitioner

## 2018-08-03 ENCOUNTER — Inpatient Hospital Stay (HOSPITAL_BASED_OUTPATIENT_CLINIC_OR_DEPARTMENT_OTHER)
Admission: RE | Admit: 2018-08-03 | Discharge: 2018-08-03 | Disposition: A | Discharge status: Home / Self Care | Payer: BLUE CROSS/BLUE SHIELD | Source: Ambulatory Visit | Attending: Nurse Practitioner | Admitting: Nurse Practitioner

## 2018-08-03 DIAGNOSIS — I480 Paroxysmal atrial fibrillation: Secondary | ICD-10-CM

## 2018-08-03 NOTE — Progress Notes (Signed)
Primary Care Physician: [name], MD Primary Cardiologist: Hochrein Primary Electrophysiologist: Allred   Electrophysiology TeleHealth Note   Due to national recommendations of social distancing due to COVID 64, Audio/videdeo telehealth visit is felt to be most appropriate for this patient at this time.  See MyChart message/consent belowv from today for patient consent regarding telehealth for the Atrial Fibrillation Clinic.      Philip [name] is a 58 y.o. male with a history of persistent atrial fibrillation who presents for follow up in the [facility] per my chart video..  Since discharge for Tikosyn load in June 2019, the patient reports doing very well.  His energy level has improved. Weight is stable on usual diuretics.  He had cellulitis of LE treated by PCP last week which is resolved.He continues on [place] and [employer]. He has not noted any afib.he is staying isolated with Covid. Working out in the yard a lot.  Today, he  denies symptoms of palpitations, chest pain, shortness of breath, orthopnea, PND, lower extremity edema, dizziness, presyncope, syncope, snoring, daytime somnolence, bleeding, or neurologic sequela. The patient is tolerating medications without difficulties and is otherwise without complaint today.    Atrial Fibrillation Risk Factors:  he does have symptoms or diagnosis of sleep apnea. he is compliant with CPAP therapy.  he does not have a history of rheumatic fever.  he has a BMI of There is no height or weight on file to calculate BMI.. There were no vitals filed for this visit.  LA size: 46   Atrial Fibrillation Management history:  Previous antiarrhythmic drugs: Flecainide -> Tikosyn  Previous cardioversions: 07/2017, 08/2017  Previous ablations: none  CHADS2VASC score: 2  Anticoagulation history: Xarelto   Past Medical History:  Diagnosis Date  . Arthritis   . Dental crowns present   . Diabetes  mellitus    NIDDM  . Diabetic foot ulcer (HCC) 08/2011   right foot  . Fatty liver   . Gastric ulcer    no current problems  . GERD (gastroesophageal reflux disease)   . Gout   . 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 Hx of bacterial endocarditis 11/2003  . Hypertension   . Neuropathy    feet bilat   . Obesity   . Persistent atrial fibrillation   . Sleep apnea    uses CPAP nightly  . Toe deformity 08/2011   right claw hallux   Past Surgical History:  Procedure Laterality Date  . adenoid surgery     . AORTIC VALVE REPLACEMENT  11/2003  . CARDIOVERSION N/A 07/25/2017   Procedure: CARDIOVERSION;  Surgeon: Chilton Si, MD;  Location: [facility] ENDOSCOPY;  Service: Cardiovascular;  Laterality: N/A;  . CARDIOVERSION N/A 09/07/2017   Procedure: CARDIOVERSION;  Surgeon: Chrystie Nose, MD;  Location: [facility] ENDOSCOPY;  Service: Cardiovascular;  Laterality: N/A;  . CARDIOVERSION N/A 09/29/2017   Procedure: CARDIOVERSION;  Surgeon: Thurmon Fair, MD;  Location: [facility] ENDOSCOPY;  Service: Cardiovascular;  Laterality: N/A;  . COLONOSCOPY WITH PROPOFOL N/A 03/30/2017   Procedure: COLONOSCOPY WITH PROPOFOL;  Surgeon: Benancio Deeds, MD;  Location: [facility] ENDOSCOPY;  Service: Gastroenterology;  Laterality: N/A;  . ESOPHAGOGASTRODUODENOSCOPY (EGD) WITH PROPOFOL N/A 03/29/2017   Procedure: ESOPHAGOGASTRODUODENOSCOPY (EGD) WITH PROPOFOL;  Surgeon: Benancio Deeds, MD;  Location: [facility] ENDOSCOPY;  Service: Gastroenterology;  Laterality: N/A;  . GASTRIC BYPASS  03/2004  .  GIVENS CAPSULE STUDY N/A 03/30/2017   Procedure: GIVENS CAPSULE STUDY;  Surgeon: Benancio Deeds, MD;  Location: [facility] ENDOSCOPY;  Service: Gastroenterology;  Laterality: N/A;  . KNEE ARTHROSCOPY  08/09/2007 - left   07/15/2003 - right  . METATARSAL OSTEOTOMY  03/20/2010   right 1st MT; gastroc soleus recession  . NASAL SINUS SURGERY    .  TENDON RELEASE  09/09/2011   Procedure: HEEL CORD LENGTHENING;  Surgeon: Toni Arthurs, MD;  Location: [facility];  Service: Orthopedics;  Laterality: Right;  Righ tachilles tendon lengthening  . TOE FUSION  07/12/2005   left 3rd toe PIP and DIP fusion  . TONSILLECTOMY AND ADENOIDECTOMY    . TOTAL KNEE ARTHROPLASTY Left 03/18/2015   Procedure: LEFT TOTAL KNEE ARTHROPLASTY;  Surgeon: Durene Romans, MD;  Location: [facility] ORS;  Service: Orthopedics;  Laterality: Left;    Current Outpatient Medications  Medication Sig Dispense Refill  . Ascorbic Acid (VITAMIN C) 1000 MG tablet Take 1,000 mg by mouth daily.    . B Complex-C (SUPER B COMPLEX PO) Take 1 tablet by mouth daily.     . Calcium Carb-Cholecalciferol (CALCIUM 600/VITAMIN D3 PO) Take 1 tablet by mouth 2 (two) times daily.    . cetirizine (ZYRTEC ALLERGY) 10 MG tablet Take 10 mg by mouth daily.     Marland Kitchen dofetilide (TIKOSYN) 250 MCG capsule TAKE 1 CAPSULE BY MOUTH TWICE DAILY 60 capsule 7  . esomeprazole (NEXIUM) 40 MG capsule Take 40 mg by mouth every evening.     . furosemide (LASIX) 40 MG tablet Take 1 tablet (40 mg total) by mouth daily. 30 tablet 6  . losartan (COZAAR) 25 MG tablet Take 25 mg by mouth every evening.     . Melatonin 3 MG CAPS Take 3 mg by mouth at bedtime.     . metolazone (ZAROXOLYN) 5 MG tablet TAKE 1 TABLET WEEKLY ON    SATURDAYS 12 tablet 3  . metoprolol succinate (TOPROL-XL) 50 MG 24 hr tablet Take 1 tablet (50 mg total) by mouth daily. 90 tablet 2  . NON FORMULARY Apply 1 application topically daily. Triamcinolone-Eucerin Compound Cream    . potassium chloride SA (KLOR-CON M20) 20 MEQ tablet Take 1 tablet (20 mEq total) by mouth daily. 1 extra tablet on days taking metolazone 95 tablet 2  . PRESCRIPTION MEDICATION Inject 1 Dose as directed. Cortisone Shot in Right Shoulder Joint.    . simvastatin (ZOCOR) 10 MG tablet Take 10 mg by mouth every evening.     . sitaGLIPtin-metformin (JANUMET) 50-1000 MG tablet Take  1 tablet by mouth 2 (two) times daily with a meal.     . XARELTO 20 MG TABS tablet TAKE 1 TABLET DAILY WITH   SUPPER 90 tablet 1   No current facility-administered medications for this encounter.     Allergies  Allergen Reactions  . Nsaids Swelling and Other (See Comments)  . Penicillins Hives, Swelling and Other (See Comments)    NO REACTION LISTED Has patient had a PCN reaction causing immediate rash, facial/tongue/throat swelling, SOB or lightheadedness with hypotension: Yes Has patient had a PCN reaction causing severe rash involving mucus membranes or skin necrosis: No Has patient had a PCN reaction that required hospitalization No Has patient had a PCN reaction occurring within the last 10 years: No If all of the above answers are "NO", then may proceed with Cephalosporin use.  Has taken Keflex w/o issue      . Other Other (See Comments)  CYCLOOXYGENASE INHIBITORS-NO REACTION LISTED.    Social History   Socioeconomic History  . Marital status: Married    Spouse name: Not on file  . Number of children: Not on file  . Years of education: Not on file  . Highest education level: Not on file  Occupational History  . Not on file  Social Needs  . Financial resource strain: Not on file  . Food insecurity:    Worry: Not on file    Inability: Not on file  . Transportation needs:    Medical: Not on file    Non-medical: Not on file  Tobacco Use  . Smoking status: Former Smoker    Packs/day: 1.00    Years: 2.00    Pack years: 2.00    Types: Cigars, Pipe, Cigarettes    Last attempt to quit: 01/31/2011    Years since quitting: 7.5  . Smokeless tobacco: Never Used  . Tobacco comment: smokes 1-2 cigars/week  Substance and Sexual Activity  . Alcohol use: Not Currently    Alcohol/week: 0.0 standard drinks  . Drug use: No  . Sexual activity: Not on file  Lifestyle  . Physical activity:    Days per week: Not on file    Minutes per session: Not on file  . Stress: Not  on file  Relationships  . Social connections:    Talks on phone: Not on file    Gets together: Not on file    Attends religious service: Not on file    Active member of club or organization: Not on file    Attends meetings of clubs or organizations: Not on file    Relationship status: Not on file  . Intimate partner violence:    Fear of current or ex partner: Not on file    Emotionally abused: Not on file    Physically abused: Not on file    Forced sexual activity: Not on file  Other Topics Concern  . Not on file  Social History Narrative  . Not on file    Family History  Problem Relation Age of Onset  . Alzheimer's disease Mother   . Diabetes Father   . Colon cancer Unknown        family history    ROS- All systems are reviewed and negative except as per the HPI above.  Physical Exam: There were no vitals filed for this visit.  GEN- The patient is well appearing, alert and oriented x 3 today.     Wt Readings from Last 3 Encounters:  04/05/18 (!) 152.4 kg  02/14/18 (!) 152.9 kg  02/01/18 (!) 151.5 kg    EKG none  Epic records are reviewed at length today  Assessment and Plan:  1. Persistent atrial fibrillation Maintaining SR on Tikosyn Continue Xarelto for CHADS2VASC of 2 QTc has been stable in the past  2.  Morbid obesity There is no height or weight on file to calculate BMI. Weight loss encouarged  3.  OSA Compliant with CPAP  4.  Acute on chronic diastolic heart failure Stable with  Metolazone/lasix  5.  S/p bioprosthetic AVR Stable by echo 12/2016 Prior endocarditis  6. He is following covid 19 sociual distancing  guidelines   Follow up with Dr Antoine PocheHochrein  as scheduled   afib clinic in 3 months  I hereby voluntarily request, consent and authorize the Atrial Fibrillation Clinic and its employed or contracted physicians, physician assistants, nurse practitioners or other licensed health care professionals (the Practitioner), to  provide me with  telemedicine health care services (the "Services") as deemed necessary by the treating Practitioner. I acknowledge and consent to receive the Services by the Practitioner via telemedicine. I understand that the telemedicine visit will involve communicating with the Practitioner through live audiovisual communication technology and the disclosure of certain medical information by electronic transmission. I acknowledge that I have been given the opportunity to request an in-person assessment or other available alternative prior to the telemedicine visit and am voluntarily participating in the telemedicine visit.   I understand that I have the right to withhold or withdraw my consent to the use of telemedicine in the course of my care at any time, without affecting my right to future care or treatment, and that the Practitioner or I may terminate the telemedicine visit at any time. I understand that I have the right to inspect all information obtained and/or recorded in the course of the telemedicine visit and may receive copies of available information for a reasonable fee.  I understand that some of the potential risks of receiving the Services via telemedicine include:   Delay or interruption in medical evaluation due to technological equipment failure or disruption;  Information transmitted may not be sufficient (e.g. poor resolution of images) to allow for appropriate medical decision making by the Practitioner; and/or  In rare instances, security protocols could fail, causing a breach of personal health information.   Furthermore, I acknowledge that it is my responsibility to provide information about my medical history, conditions and care that is complete and accurate to the best of my ability. I acknowledge that Practitioner's advice, recommendations, and/or decision may be based on factors not within their control, such as incomplete or inaccurate data provided by me or distortions of diagnostic images  or specimens that may result from electronic transmissions. I understand that the practice of medicine is not an exact science and that Practitioner makes no warranties or guarantees regarding treatment outcomes. I acknowledge that I will receive a copy of this consent concurrently upon execution via email to the email address I last provided but may also request a printed copy by calling the office of the Atrial Fibrillation Clinic.  I understand that my insurance will be billed for this visit.   I have read or had this consent read to me.  I understand the contents of this consent, which adequately explains the benefits and risks of the Services being provided via telemedicine.  I have been provided ample opportunity to ask questions regarding this consent and the Services and have had my questions answered to my satisfaction.  I give my informed consent for the services to be provided through the use of telemedicine in my medical care  By participating in this telemedicine visit I agree to the above.       Elvina Sidle Matthew Folks Afib Clinic [facility] [address] [place], [place] [address] [phone]

## 2018-08-08 NOTE — Addendum Note (Signed)
Encounter addended by: Newman Nip, NP on: 08/08/2018 1:33 PM  Actions taken: Charge Capture section accepted

## 2018-10-12 ENCOUNTER — Other Ambulatory Visit: Payer: Self-pay | Admitting: Internal Medicine

## 2018-11-15 ENCOUNTER — Encounter (HOSPITAL_COMMUNITY): Payer: Self-pay | Admitting: Nurse Practitioner

## 2018-11-15 ENCOUNTER — Other Ambulatory Visit: Payer: Self-pay

## 2018-11-15 ENCOUNTER — Ambulatory Visit (HOSPITAL_COMMUNITY)
Admission: RE | Admit: 2018-11-15 | Discharge: 2018-11-15 | Disposition: A | Discharge status: Home / Self Care | Payer: BC Managed Care – PPO | Source: Ambulatory Visit | Attending: Nurse Practitioner | Admitting: Nurse Practitioner

## 2018-11-15 VITALS — BP 118/72 | HR 65 | Ht 77.0 in | Wt 332.0 lb

## 2018-11-15 DIAGNOSIS — G4733 Obstructive sleep apnea (adult) (pediatric): Secondary | ICD-10-CM | POA: Diagnosis not present

## 2018-11-15 DIAGNOSIS — Z886 Allergy status to analgesic agent status: Secondary | ICD-10-CM | POA: Insufficient documentation

## 2018-11-15 DIAGNOSIS — Z87891 Personal history of nicotine dependence: Secondary | ICD-10-CM | POA: Insufficient documentation

## 2018-11-15 DIAGNOSIS — I4819 Other persistent atrial fibrillation: Secondary | ICD-10-CM | POA: Diagnosis present

## 2018-11-15 DIAGNOSIS — Z79899 Other long term (current) drug therapy: Secondary | ICD-10-CM | POA: Insufficient documentation

## 2018-11-15 DIAGNOSIS — Z7901 Long term (current) use of anticoagulants: Secondary | ICD-10-CM | POA: Insufficient documentation

## 2018-11-15 DIAGNOSIS — Z952 Presence of prosthetic heart valve: Secondary | ICD-10-CM | POA: Diagnosis not present

## 2018-11-15 DIAGNOSIS — Z9884 Bariatric surgery status: Secondary | ICD-10-CM | POA: Diagnosis not present

## 2018-11-15 DIAGNOSIS — K219 Gastro-esophageal reflux disease without esophagitis: Secondary | ICD-10-CM | POA: Insufficient documentation

## 2018-11-15 DIAGNOSIS — I110 Hypertensive heart disease with heart failure: Secondary | ICD-10-CM | POA: Insufficient documentation

## 2018-11-15 DIAGNOSIS — E1140 Type 2 diabetes mellitus with diabetic neuropathy, unspecified: Secondary | ICD-10-CM | POA: Diagnosis not present

## 2018-11-15 DIAGNOSIS — Z7984 Long term (current) use of oral hypoglycemic drugs: Secondary | ICD-10-CM | POA: Diagnosis not present

## 2018-11-15 DIAGNOSIS — Z880 Allergy status to penicillin: Secondary | ICD-10-CM | POA: Insufficient documentation

## 2018-11-15 DIAGNOSIS — Z6839 Body mass index (BMI) 39.0-39.9, adult: Secondary | ICD-10-CM | POA: Insufficient documentation

## 2018-11-15 DIAGNOSIS — M109 Gout, unspecified: Secondary | ICD-10-CM | POA: Insufficient documentation

## 2018-11-15 DIAGNOSIS — I5033 Acute on chronic diastolic (congestive) heart failure: Secondary | ICD-10-CM | POA: Diagnosis not present

## 2018-11-15 LAB — CBC
HCT: 38.6 % — ABNORMAL LOW (ref 39.0–52.0)
Hemoglobin: 12.1 g/dL — ABNORMAL LOW (ref 13.0–17.0)
MCH: 28.8 pg (ref 26.0–34.0)
MCHC: 31.3 g/dL (ref 30.0–36.0)
MCV: 91.9 fL (ref 80.0–100.0)
Platelets: 163 10*3/uL (ref 150–400)
RBC: 4.2 MIL/uL — ABNORMAL LOW (ref 4.22–5.81)
RDW: 14.6 % (ref 11.5–15.5)
WBC: 6.2 10*3/uL (ref 4.0–10.5)
nRBC: 0 % (ref 0.0–0.2)

## 2018-11-15 LAB — HEMOGLOBIN A1C
Hgb A1c MFr Bld: 6.7 % — ABNORMAL HIGH (ref 4.8–5.6)
Mean Plasma Glucose: 145.59 mg/dL

## 2018-11-15 LAB — BASIC METABOLIC PANEL
Anion gap: 10 (ref 5–15)
BUN: 14 mg/dL (ref 6–20)
CO2: 28 mmol/L (ref 22–32)
Calcium: 9.3 mg/dL (ref 8.9–10.3)
Chloride: 99 mmol/L (ref 98–111)
Creatinine, Ser: 1.14 mg/dL (ref 0.61–1.24)
GFR calc Af Amer: >60 mL/min (ref >60)
GFR calc non Af Amer: >60 mL/min (ref >60)
Glucose, Bld: 123 mg/dL — ABNORMAL HIGH (ref 70–99)
Potassium: 4 mmol/L (ref 3.5–5.1)
Sodium: 137 mmol/L (ref 135–145)

## 2018-11-15 LAB — MAGNESIUM: Magnesium: 2 mg/dL (ref 1.7–2.4)

## 2018-11-15 NOTE — Progress Notes (Signed)
Primary Care Physician: Robyne Peers, MD Primary Cardiologist: Percival Spanish Primary Electrophysiologist: Ronna Polio Philip [name] is a 58 y.o. male with a history of persistent atrial fibrillation who presents for follow up in the [facility].  Since discharge for Tikosyn load in June 2019, the patient reports doing very well.  His energy level has improved. Weight is stable on usual diuretics.  He has not noted any afib.  Today, he  denies symptoms of palpitations, chest pain, shortness of breath, orthopnea, PND, lower extremity edema, dizziness, presyncope, syncope, snoring, daytime somnolence, bleeding, or neurologic sequela. The patient is tolerating medications without difficulties and is otherwise without complaint today.    Atrial Fibrillation Risk Factors:  he does have symptoms or diagnosis of sleep apnea. he is compliant with CPAP therapy.  he does not have a history of rheumatic fever.  he has a BMI of Body mass index is 39.37 kg/m.Marland Kitchen Filed Weights   11/15/18 0838  Weight: (!) 150.6 kg    LA size: 46   Atrial Fibrillation Management history:  Previous antiarrhythmic drugs: Flecainide -> Tikosyn  Previous cardioversions: 07/2017, 08/2017  Previous ablations: none  CHADS2VASC score: 2  Anticoagulation history: Xarelto   Past Medical History:  Diagnosis Date  . Arthritis   . Dental crowns present   . Diabetes mellitus    NIDDM  . Diabetic foot ulcer ([place]) 08/2011   right foot  . Fatty liver   . Gastric ulcer    no current problems  . GERD (gastroesophageal reflux disease)   . Gout   . 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 Hx of bacterial endocarditis 11/2003  . Hypertension   . Neuropathy    feet bilat   . Obesity   . Persistent atrial fibrillation   . Sleep apnea    uses CPAP nightly  . 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 adenoid surgery     . AORTIC VALVE REPLACEMENT  11/2003  . CARDIOVERSION N/A 07/25/2017   Procedure: CARDIOVERSION;  Surgeon: Skeet Latch, MD;  Location: [place];  Service: Cardiovascular;  Laterality: N/A;  . CARDIOVERSION N/A 09/07/2017   Procedure: CARDIOVERSION;  Surgeon: Pixie Casino, MD;  Location: [facility] ENDOSCOPY;  Service: Cardiovascular;  Laterality: N/A;  . CARDIOVERSION N/A 09/29/2017   Procedure: CARDIOVERSION;  Surgeon: Sanda Klein, MD;  Location: [place] ENDOSCOPY;  Service: Cardiovascular;  Laterality: N/A;  . COLONOSCOPY WITH PROPOFOL N/A 03/30/2017   Procedure: COLONOSCOPY WITH PROPOFOL;  Surgeon: Yetta Flock, MD;  Location: [facility] ENDOSCOPY;  Service: Gastroenterology;  Laterality: N/A;  . ESOPHAGOGASTRODUODENOSCOPY (EGD) WITH PROPOFOL N/A 03/29/2017   Procedure: ESOPHAGOGASTRODUODENOSCOPY (EGD) WITH PROPOFOL;  Surgeon: Yetta Flock, MD;  Location: [facility] ENDOSCOPY;  Service: Gastroenterology;  Laterality: N/A;  . GASTRIC BYPASS  03/2004  . GIVENS CAPSULE STUDY N/A 03/30/2017   Procedure: GIVENS CAPSULE STUDY;  Surgeon: Yetta Flock, MD;  Location: [facility] ENDOSCOPY;  Service: Gastroenterology;  Laterality: N/A;  . KNEE ARTHROSCOPY  08/09/2007 - left   07/15/2003 - right  . METATARSAL OSTEOTOMY  03/20/2010   right 1st MT; gastroc soleus recession  . NASAL SINUS SURGERY    . TENDON RELEASE  09/09/2011   Procedure: HEEL CORD LENGTHENING;  Surgeon: Wylene Simmer, MD;  Location: [place];  Service: Orthopedics;  Laterality: Right;  Righ tachilles tendon lengthening  . TOE FUSION  07/12/2005   left 3rd toe PIP and DIP fusion  . TONSILLECTOMY AND ADENOIDECTOMY    . TOTAL KNEE ARTHROPLASTY Left 03/18/2015   Procedure: LEFT TOTAL KNEE ARTHROPLASTY;  Surgeon: Durene RomansMatthew Olin, MD;  Location: [facility] ORS;  Service: Orthopedics;  Laterality: Left;    Current Outpatient Medications  Medication Sig Dispense  Refill  . Ascorbic Acid (VITAMIN C) 1000 MG tablet Take 1,000 mg by mouth daily.    . Calcium Carb-Cholecalciferol (CALCIUM 600/VITAMIN D3 PO) Take 1 tablet by mouth 2 (two) times daily.    . cetirizine (ZYRTEC ALLERGY) 10 MG tablet Take 10 mg by mouth daily.     Marland Kitchen. dofetilide (TIKOSYN) 250 MCG capsule TAKE 1 CAPSULE BY MOUTH TWICE DAILY 60 capsule 5  . esomeprazole (NEXIUM) 40 MG capsule Take 40 mg by mouth every evening.     . furosemide (LASIX) 40 MG tablet Take 1 tablet (40 mg total) by mouth daily. 30 tablet 6  . losartan (COZAAR) 25 MG tablet Take 25 mg by mouth every evening.     . Melatonin 3 MG CAPS Take 3 mg by mouth at bedtime.     . metolazone (ZAROXOLYN) 5 MG tablet TAKE 1 TABLET WEEKLY ON    SATURDAYS 12 tablet 3  . metoprolol succinate (TOPROL-XL) 50 MG 24 hr tablet Take 1 tablet (50 mg total) by mouth daily. 90 tablet 2  . NON FORMULARY Apply 1 application topically daily. Triamcinolone-Eucerin Compound Cream    . potassium chloride SA (KLOR-CON M20) 20 MEQ tablet Take 1 tablet (20 mEq total) by mouth daily. 1 extra tablet on days taking metolazone 95 tablet 2  . simvastatin (ZOCOR) 10 MG tablet Take 10 mg by mouth every evening.     . sitaGLIPtin-metformin (JANUMET) 50-1000 MG tablet Take 1 tablet by mouth 2 (two) times daily with a meal.     . XARELTO 20 MG TABS tablet TAKE 1 TABLET DAILY WITH   SUPPER 90 tablet 1   No current facility-administered medications for this encounter.     Allergies  Allergen Reactions  . Nsaids Swelling and Other (See Comments)  . Penicillins Hives, Swelling and Other (See Comments)    NO REACTION LISTED Has patient had a PCN reaction causing immediate rash, facial/tongue/throat swelling, SOB or lightheadedness with hypotension: Yes Has patient had a PCN reaction causing severe rash involving mucus membranes or skin necrosis: No Has patient had a PCN reaction that required hospitalization No Has patient had a PCN reaction occurring within the  last 10 years: No If all of the above answers are "NO", then may proceed with Cephalosporin use.  Has taken Keflex w/o issue      . Other Other (See Comments)    CYCLOOXYGENASE INHIBITORS-NO REACTION LISTED.    Social History   Socioeconomic History  . Marital status: Married    Spouse name: Not on file  . Number of children: Not on file  . Years of education: Not on file  . Highest education level: Not on file  Occupational History  . Not on file  Social Needs  . Financial resource strain: Not on file  . Food insecurity    Worry: Not on file    Inability: Not on file  . Transportation needs    Medical: Not on file    Non-medical: Not on file  Tobacco Use  . Smoking status: Former Smoker    Packs/day: 1.00  Years: 2.00    Pack years: 2.00    Types: Cigars, Pipe, Cigarettes    Quit date: 01/31/2011    Years since quitting: 7.7  . Smokeless tobacco: Never Used  . Tobacco comment: smokes 1-2 cigars/week  Substance and Sexual Activity  . Alcohol use: Not Currently    Alcohol/week: 0.0 standard drinks  . Drug use: No  . Sexual activity: Not on file  Lifestyle  . Physical activity    Days per week: Not on file    Minutes per session: Not on file  . Stress: Not on file  Relationships  . Social Musicianconnections    Talks on phone: Not on file    Gets together: Not on file    Attends religious service: Not on file    Active member of club or organization: Not on file    Attends meetings of clubs or organizations: Not on file    Relationship status: Not on file  . Intimate partner violence    Fear of current or ex partner: Not on file    Emotionally abused: Not on file    Physically abused: Not on file    Forced sexual activity: Not on file  Other Topics Concern  . Not on file  Social History Narrative  . Not on file    Family History  Problem Relation Age of Onset  . Alzheimer's disease Mother   . Diabetes Father   . Colon cancer Unknown        family  history    ROS- All systems are reviewed and negative except as per the HPI above.  Physical Exam: Vitals:   11/15/18 0838  BP: 118/72  Pulse: 65  Weight: (!) 150.6 kg  Height: 6\' 5"  (1.956 m)    GEN- The patient is well appearing, alert and oriented x 3 today.   Head- normocephalic, atraumatic Eyes-  Sclera clear, conjunctiva pink Ears- hearing intact Oropharynx- clear Neck- supple  Lungs- Clear to ausculation bilaterally, normal work of breathing Heart- Regular rate and rhythm  GI- soft, NT, ND, + BS Extremities- no clubbing, cyanosis, or edema MS- no significant deformity or atrophy Skin- no rash or lesion Psych- euthymic mood, full affect Neuro- strength and sensation are intact  Wt Readings from Last 3 Encounters:  11/15/18 (!) 150.6 kg  04/05/18 (!) 152.4 kg  02/14/18 (!) 152.9 kg    EKG today demonstrates sinus rhythm, rate 63, QTc 488 ms    Epic records are reviewed at length today  Assessment and Plan:  1. Persistent atrial fibrillation Maintaining SR on Tikosyn Continue Xarelto for CHADS2VASC of 2 QTc stable  2.  Morbid obesity Body mass index is 39.37 kg/m. Weight loss encouarged  3.  OSA Compliant with CPAP  4.  Acute on chronic diastolic heart failure Stable with  Metolazone/lasix  5.  S/p bioprosthetic AVR Stable by echo 12/2016 Prior endocarditis  Follow up with Dr Antoine PocheHochrein  as scheduled   afib clinic in 6 months  Elvina SidleDonna C. Matthew Folksarroll, ANP-C [facility] [address] [place], [place] [address] [phone]

## 2018-11-18 ENCOUNTER — Other Ambulatory Visit: Payer: Self-pay | Admitting: Cardiology

## 2018-11-20 NOTE — Telephone Encounter (Signed)
66m 152.9kg 1.14scr  11/15/18 ccr 155   Lovw/carrol 11/15/18

## 2019-01-28 DIAGNOSIS — I340 Nonrheumatic mitral (valve) insufficiency: Secondary | ICD-10-CM | POA: Insufficient documentation

## 2019-01-28 DIAGNOSIS — I10 Essential (primary) hypertension: Secondary | ICD-10-CM | POA: Insufficient documentation

## 2019-01-28 NOTE — Progress Notes (Signed)
Cardiology Office Note   Date:  01/29/2019   ID:  Philip [name], DOB [birth year], MRN [id number]  PCP:  [name], MD  Cardiologist:   [name], MD   Chief Complaint  Patient presents with  . Atrial Fibrillation      History of Present Illness: Philip [name] is a 58 y.o. male who presents for follow up of AVR and atrial fib. The patient had a bioprosthetic aortic valve replacement in 2005.  He had a follow up echo in Sept of 2018.    This showed a NL bioprosthesis.  He has had anemia and GI bleeding.  He had questionable AVMs seen on capsule endoscopy.  He has had DCCV and treatment and with Tikosyn.  He has been followed in the Atrial Fibrillation Clinic.    Since I last saw him he has done well. The patient denies any new symptoms such as chest discomfort, neck or arm discomfort. There has been no new shortness of breath, PND or orthopnea. There have been no reported palpitations, presyncope or syncope.  He is not had any further tachypalpitations.  He has some chronic lower extremity swelling and he will occasionally take a Zaroxolyn which he takes about every 8 to 10 days.  He has had no further problems with GI bleeding.  He is doing a little activity and is been started doing rehab is his wife is doing physical therapy for knee problem and he is been exercise with her.    Past Medical History:  Diagnosis Date  . Arthritis   . Dental crowns present   . Diabetes mellitus    NIDDM  . Diabetic foot ulcer (HCC) 08/2011   right foot  . Fatty liver   . Gastric ulcer    no current problems  . GERD (gastroesophageal reflux disease)   . Gout   . 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 Hx of bacterial endocarditis 11/2003  . Hypertension   . Neuropathy    feet bilat   . Obesity   . Persistent atrial fibrillation (HCC)   . Sleep apnea    uses CPAP nightly  . Toe deformity  08/2011   right claw hallux    Past Surgical History:  Procedure Laterality Date  . adenoid surgery     . AORTIC VALVE REPLACEMENT  11/2003  . CARDIOVERSION N/A 07/25/2017   Procedure: CARDIOVERSION;  Surgeon: Chilton Si, MD;  Location: [facility] ENDOSCOPY;  Service: Cardiovascular;  Laterality: N/A;  . CARDIOVERSION N/A 09/07/2017   Procedure: CARDIOVERSION;  Surgeon: Chrystie Nose, MD;  Location: [facility] ENDOSCOPY;  Service: Cardiovascular;  Laterality: N/A;  . CARDIOVERSION N/A 09/29/2017   Procedure: CARDIOVERSION;  Surgeon: Thurmon Fair, MD;  Location: [facility] ENDOSCOPY;  Service: Cardiovascular;  Laterality: N/A;  . COLONOSCOPY WITH PROPOFOL N/A 03/30/2017   Procedure: COLONOSCOPY WITH PROPOFOL;  Surgeon: Benancio Deeds, MD;  Location: [facility] ENDOSCOPY;  Service: Gastroenterology;  Laterality: N/A;  . ESOPHAGOGASTRODUODENOSCOPY (EGD) WITH PROPOFOL N/A 03/29/2017   Procedure: ESOPHAGOGASTRODUODENOSCOPY (EGD) WITH PROPOFOL;  Surgeon: Benancio Deeds, MD;  Location: [facility] ENDOSCOPY;  Service: Gastroenterology;  Laterality: N/A;  . GASTRIC BYPASS  03/2004  . GIVENS CAPSULE STUDY N/A 03/30/2017   Procedure: GIVENS CAPSULE STUDY;  Surgeon: Benancio Deeds, MD;  Location: [facility] ENDOSCOPY;  Service: Gastroenterology;  Laterality: N/A;  . KNEE ARTHROSCOPY  08/09/2007 - left  07/15/2003 - right  . METATARSAL OSTEOTOMY  03/20/2010   right 1st MT; gastroc soleus recession  . NASAL SINUS SURGERY    . TENDON RELEASE  09/09/2011   Procedure: HEEL CORD LENGTHENING;  Surgeon: Toni ArthursJohn Hewitt, MD;  Location: [facility];  Service: Orthopedics;  Laterality: Right;  Righ tachilles tendon lengthening  . TOE FUSION  07/12/2005   left 3rd toe PIP and DIP fusion  . TONSILLECTOMY AND ADENOIDECTOMY    . TOTAL KNEE ARTHROPLASTY Left 03/18/2015   Procedure: LEFT TOTAL KNEE ARTHROPLASTY;  Surgeon: Durene RomansMatthew Olin, MD;  Location: [facility] ORS;  Service: Orthopedics;  Laterality: Left;     Current Outpatient  Medications  Medication Sig Dispense Refill  . Ascorbic Acid (VITAMIN C) 1000 MG tablet Take 1,000 mg by mouth daily.    . Calcium Carb-Cholecalciferol (CALCIUM 600/VITAMIN D3 PO) Take 1 tablet by mouth 2 (two) times daily.    . cetirizine (ZYRTEC ALLERGY) 10 MG tablet Take 10 mg by mouth daily.     Marland Kitchen. dofetilide (TIKOSYN) 250 MCG capsule TAKE 1 CAPSULE BY MOUTH TWICE DAILY 60 capsule 5  . esomeprazole (NEXIUM) 40 MG capsule Take 40 mg by mouth every evening.     . furosemide (LASIX) 40 MG tablet Take 1 tablet (40 mg total) by mouth daily. 30 tablet 6  . losartan (COZAAR) 25 MG tablet Take 25 mg by mouth every evening.     . Melatonin 3 MG CAPS Take 3 mg by mouth at bedtime.     . metolazone (ZAROXOLYN) 5 MG tablet TAKE 1 TABLET WEEKLY ON    SATURDAYS 12 tablet 3  . metoprolol succinate (TOPROL-XL) 50 MG 24 hr tablet Take 1 tablet (50 mg total) by mouth daily. 90 tablet 2  . NON FORMULARY Apply 1 application topically daily. Triamcinolone-Eucerin Compound Cream    . potassium chloride SA (KLOR-CON M20) 20 MEQ tablet Take 1 tablet (20 mEq total) by mouth daily. 1 extra tablet on days taking metolazone 95 tablet 2  . simvastatin (ZOCOR) 10 MG tablet Take 10 mg by mouth every evening.     . sitaGLIPtin-metformin (JANUMET) 50-1000 MG tablet Take 1 tablet by mouth 2 (two) times daily with a meal.     . XARELTO 20 MG TABS tablet TAKE 1 TABLET DAILY WITH   SUPPER 90 tablet 1   No current facility-administered medications for this visit.     Allergies:   Nsaids, Penicillins, and Other    ROS:  Please see the history of present illness.   Otherwise, review of systems are positive for none.   All other systems are reviewed and negative.    PHYSICAL EXAM: VS:  BP 107/71   Pulse 75   Temp (!) 96.8 F (36 C)   Ht 6\' 5"  (1.956 m)   Wt (!) 334 lb 6.4 oz (151.7 kg)   SpO2 99%   BMI 39.65 kg/m  , BMI Body mass index is 39.65 kg/m. GENERAL:  Well appearing NECK:  No jugular venous distention,  waveform within normal limits, carotid upstroke brisk and symmetric, no bruits, no thyromegaly LUNGS:  Clear to auscultation bilaterally CHEST:  Well healed sternotomy scar.   HEART:  PMI not displaced or sustained,S1 and S2 within normal limits, no S3, no S4, no clicks, no rubs, no murmurs ABD:  Flat, positive bowel sounds normal in frequency in pitch, no bruits, no rebound, no guarding, no midline pulsatile mass, no hepatomegaly, no splenomegaly EXT:  2 plus pulses throughout,  left greater than right chronic leg mild edema, no cyanosis no clubbing, chronic venous stasis changes left greater than right   EKG:  EKG is ordered today. The ekg ordered today demonstrates sinus rhythm, rate 78, borderline interventricular conduction delay, QTC prolonged but unchanged from previous.   Recent Labs: 11/15/2018: BUN 14; Creatinine, Ser 1.14; Hemoglobin 12.1; Magnesium 2.0; Platelets 163; Potassium 4.0; Sodium 137    Lipid Panel No results found for: CHOL, TRIG, HDL, CHOLHDL, VLDL, LDLCALC, LDLDIRECT    Wt Readings from Last 3 Encounters:  01/29/19 (!) 334 lb 6.4 oz (151.7 kg)  11/15/18 (!) 332 lb (150.6 kg)  04/05/18 (!) 336 lb (152.4 kg)      Other studies Reviewed: Additional studies/ records that were reviewed today include:  Labs Review of the above records demonstrates:  Please see elsewhere in the note.     ASSESSMENT AND PLAN:  ATRIAL FIB:  Philip [name] has a CHA2DS2 - VASc score of 2.   He seems to be maintaining sinus rhythm.   No change in therapy.  QTC was not prolonged more than previous.Marland Kitchen  MITRAL REPAIR:  He had this repaired and has only mild MR. I will check an echocardiogram as it is been a couple of years.  AVR:   Check the echocardiogram as above.  HTN: The blood pressure is well controlled.  Continue the meds as listed.   OBESITY:   I gave him a target weight of 320 pounds for his first step and weight loss.     Current medicines are reviewed at length with  the patient today.  The patient does not have concerns regarding medicines.  The following changes have been made:  None Labs/ tests ordered today include: None   Orders Placed This Encounter  Procedures  . EKG 12-Lead  . ECHOCARDIOGRAM COMPLETE     Disposition:   FU with me in one year.     Signed, Minus Breeding, MD  01/29/2019 11:19 AM    [place]  '

## 2019-01-29 ENCOUNTER — Encounter: Payer: Self-pay | Admitting: Cardiology

## 2019-01-29 ENCOUNTER — Other Ambulatory Visit: Payer: Self-pay

## 2019-01-29 ENCOUNTER — Ambulatory Visit (INDEPENDENT_AMBULATORY_CARE_PROVIDER_SITE_OTHER): Payer: BC Managed Care – PPO | Admitting: Cardiology

## 2019-01-29 VITALS — BP 107/71 | HR 75 | Temp 96.8°F | Ht 77.0 in | Wt 334.4 lb

## 2019-01-29 DIAGNOSIS — I10 Essential (primary) hypertension: Secondary | ICD-10-CM

## 2019-01-29 DIAGNOSIS — I4819 Other persistent atrial fibrillation: Secondary | ICD-10-CM

## 2019-01-29 DIAGNOSIS — I340 Nonrheumatic mitral (valve) insufficiency: Secondary | ICD-10-CM | POA: Diagnosis not present

## 2019-01-29 DIAGNOSIS — Z952 Presence of prosthetic heart valve: Secondary | ICD-10-CM | POA: Diagnosis not present

## 2019-01-29 NOTE — Patient Instructions (Signed)
Medication Instructions:  Your physician recommends that you continue on your current medications as directed. Please refer to the Current Medication list given to you today.  If you need a refill on your cardiac medications before your next appointment, please call your pharmacy.   Lab work: NONE  Testing/Procedures: Your physician has requested that you have an echocardiogram. Echocardiography is a painless test that uses sound waves to create images of your heart. It provides your doctor with information about the size and shape of your heart and how well your heart's chambers and valves are working. This procedure takes approximately one hour. There are no restrictions for this procedure. [address]   Follow-Up: At [facility], you and your health needs are our priority.  As part of our continuing mission to provide you with exceptional heart care, we have created designated Provider Care Teams.  These Care Teams include your primary Cardiologist (physician) and Advanced Practice Providers (APPs -  Physician Assistants and Nurse Practitioners) who all work together to provide you with the care you need, when you need it. You will need a follow up appointment in 12 months.  Please call our office 2 months in advance to schedule this appointment.  You may see Thompson Grayer, MD or one of the following Advanced Practice Providers on your designated Care Team:   Rosaria Ferries, PA-C Jory Sims, DNP, ANP

## 2019-01-30 ENCOUNTER — Ambulatory Visit (HOSPITAL_COMMUNITY): Payer: BC Managed Care – PPO | Attending: Cardiology

## 2019-01-30 DIAGNOSIS — I4819 Other persistent atrial fibrillation: Secondary | ICD-10-CM

## 2019-01-30 DIAGNOSIS — I340 Nonrheumatic mitral (valve) insufficiency: Secondary | ICD-10-CM | POA: Diagnosis present

## 2019-01-30 DIAGNOSIS — Z952 Presence of prosthetic heart valve: Secondary | ICD-10-CM | POA: Diagnosis present

## 2019-01-30 MED ORDER — PERFLUTREN LIPID MICROSPHERE
1.0000 mL | INTRAVENOUS | Status: AC | PRN
  Administered 2019-01-30: 3 mL via INTRAVENOUS

## 2019-02-13 ENCOUNTER — Other Ambulatory Visit (HOSPITAL_COMMUNITY): Payer: Self-pay | Admitting: Nurse Practitioner

## 2019-02-13 ENCOUNTER — Other Ambulatory Visit: Payer: Self-pay | Admitting: Cardiology

## 2019-05-14 ENCOUNTER — Other Ambulatory Visit: Payer: Self-pay | Admitting: Nurse Practitioner

## 2019-06-04 ENCOUNTER — Other Ambulatory Visit (HOSPITAL_COMMUNITY): Payer: Self-pay | Admitting: *Deleted

## 2019-06-04 MED ORDER — DOFETILIDE 250 MCG PO CAPS: 250.0000 ug | ORAL_CAPSULE | Freq: Two times a day (BID) | ORAL | 6 refills | Status: DC

## 2019-08-02 ENCOUNTER — Encounter (HOSPITAL_COMMUNITY): Payer: Self-pay

## 2019-08-13 ENCOUNTER — Ambulatory Visit (HOSPITAL_COMMUNITY): Payer: BC Managed Care – PPO | Admitting: Nurse Practitioner

## 2019-08-14 ENCOUNTER — Ambulatory Visit (HOSPITAL_COMMUNITY)
Admission: RE | Admit: 2019-08-14 | Discharge: 2019-08-14 | Disposition: A | Discharge status: Home / Self Care | Payer: BC Managed Care – PPO | Source: Ambulatory Visit | Attending: Nurse Practitioner | Admitting: Nurse Practitioner

## 2019-08-14 ENCOUNTER — Encounter (HOSPITAL_COMMUNITY): Payer: Self-pay | Admitting: Nurse Practitioner

## 2019-08-14 ENCOUNTER — Other Ambulatory Visit: Payer: Self-pay

## 2019-08-14 VITALS — BP 106/74 | HR 64 | Ht 77.0 in | Wt 309.0 lb

## 2019-08-14 DIAGNOSIS — K219 Gastro-esophageal reflux disease without esophagitis: Secondary | ICD-10-CM | POA: Diagnosis not present

## 2019-08-14 DIAGNOSIS — D6869 Other thrombophilia: Secondary | ICD-10-CM | POA: Diagnosis not present

## 2019-08-14 DIAGNOSIS — I4819 Other persistent atrial fibrillation: Secondary | ICD-10-CM | POA: Diagnosis not present

## 2019-08-14 DIAGNOSIS — G4733 Obstructive sleep apnea (adult) (pediatric): Secondary | ICD-10-CM | POA: Diagnosis not present

## 2019-08-14 DIAGNOSIS — Z953 Presence of xenogenic heart valve: Secondary | ICD-10-CM | POA: Insufficient documentation

## 2019-08-14 DIAGNOSIS — Z7901 Long term (current) use of anticoagulants: Secondary | ICD-10-CM | POA: Diagnosis not present

## 2019-08-14 DIAGNOSIS — K760 Fatty (change of) liver, not elsewhere classified: Secondary | ICD-10-CM | POA: Insufficient documentation

## 2019-08-14 DIAGNOSIS — I5033 Acute on chronic diastolic (congestive) heart failure: Secondary | ICD-10-CM | POA: Diagnosis not present

## 2019-08-14 DIAGNOSIS — E119 Type 2 diabetes mellitus without complications: Secondary | ICD-10-CM | POA: Insufficient documentation

## 2019-08-14 DIAGNOSIS — Z7984 Long term (current) use of oral hypoglycemic drugs: Secondary | ICD-10-CM | POA: Insufficient documentation

## 2019-08-14 DIAGNOSIS — I110 Hypertensive heart disease with heart failure: Secondary | ICD-10-CM | POA: Insufficient documentation

## 2019-08-14 DIAGNOSIS — M1990 Unspecified osteoarthritis, unspecified site: Secondary | ICD-10-CM | POA: Insufficient documentation

## 2019-08-14 DIAGNOSIS — Z79899 Other long term (current) drug therapy: Secondary | ICD-10-CM | POA: Diagnosis not present

## 2019-08-14 DIAGNOSIS — Z87891 Personal history of nicotine dependence: Secondary | ICD-10-CM | POA: Insufficient documentation

## 2019-08-14 DIAGNOSIS — Z9884 Bariatric surgery status: Secondary | ICD-10-CM | POA: Diagnosis not present

## 2019-08-14 DIAGNOSIS — Z6836 Body mass index (BMI) 36.0-36.9, adult: Secondary | ICD-10-CM | POA: Insufficient documentation

## 2019-08-14 LAB — BASIC METABOLIC PANEL
Anion gap: 7 (ref 5–15)
BUN: 17 mg/dL (ref 6–20)
CO2: 27 mmol/L (ref 22–32)
Calcium: 9.3 mg/dL (ref 8.9–10.3)
Chloride: 105 mmol/L (ref 98–111)
Creatinine, Ser: 1 mg/dL (ref 0.61–1.24)
GFR calc Af Amer: >60 mL/min (ref >60)
GFR calc non Af Amer: >60 mL/min (ref >60)
Glucose, Bld: 123 mg/dL — ABNORMAL HIGH (ref 70–99)
Potassium: 4.4 mmol/L (ref 3.5–5.1)
Sodium: 139 mmol/L (ref 135–145)

## 2019-08-14 LAB — MAGNESIUM: Magnesium: 1.9 mg/dL (ref 1.7–2.4)

## 2019-08-14 NOTE — Progress Notes (Signed)
Primary Care Physician: [name], MD Primary Cardiologist: Antoine Poche Primary Electrophysiologist: Graylon Gunning Philip [name] is a 59 y.o. male with a history of persistent atrial fibrillation who presents for follow up in the [facility] Atrial Fibrillation Clinic.  Since discharge for Tikosyn load in June 2019, the patient reports doing very well.  His energy level has improved.   He has not noted any afib. He remains on tikosyn and xareltofor a CHA2DS2VASc score of 3.  He reports going on a keto diet and losing 35 lbs in 2 weeks. He has now leveled off. SInce losing weight he has had significant improvement in his ankle edema.He is chronically anemic sine GI bleed in 2018. His PCP is planning s stool card to check for occult bleed.   Today, he  denies symptoms of palpitations, chest pain, shortness of breath, orthopnea, PND, lower extremity edema, dizziness, presyncope, syncope, snoring, daytime somnolence, bleeding, or neurologic sequela. The patient is tolerating medications without difficulties and is otherwise without complaint today.    Atrial Fibrillation Risk Factors:  he does have symptoms or diagnosis of sleep apnea. he is compliant with CPAP therapy.  he does not have a history of rheumatic fever.  he has a BMI of Body mass index is 36.64 kg/m.Marland Kitchen Filed Weights   08/14/19 0841  Weight: (!) 140.2 kg    LA size: 46   Atrial Fibrillation Management history:  Previous antiarrhythmic drugs: Flecainide -> Tikosyn  Previous cardioversions: 07/2017, 08/2017  Previous ablations: none  CHADS2VASC score: 3  Anticoagulation history: Xarelto   Past Medical History:  Diagnosis Date  . Arthritis   . Dental crowns present   . Diabetes mellitus    NIDDM  . Diabetic foot ulcer (HCC) 08/2011   right foot  . Fatty liver   . Gastric ulcer    no current problems  . GERD (gastroesophageal reflux disease)   . Gout   . 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 Hx of bacterial endocarditis 11/2003  . Hypertension   . Neuropathy    feet bilat   . Obesity   . Persistent atrial fibrillation (HCC)   . Sleep apnea    uses CPAP nightly  . Toe deformity 08/2011   right claw hallux   Past Surgical History:  Procedure Laterality Date  . adenoid surgery     . AORTIC VALVE REPLACEMENT  11/2003  . CARDIOVERSION N/A 07/25/2017   Procedure: CARDIOVERSION;  Surgeon: Chilton Si, MD;  Location: [facility] ENDOSCOPY;  Service: Cardiovascular;  Laterality: N/A;  . CARDIOVERSION N/A 09/07/2017   Procedure: CARDIOVERSION;  Surgeon: Chrystie Nose, MD;  Location: [facility] ENDOSCOPY;  Service: Cardiovascular;  Laterality: N/A;  . CARDIOVERSION N/A 09/29/2017   Procedure: CARDIOVERSION;  Surgeon: Thurmon Fair, MD;  Location: [facility] ENDOSCOPY;  Service: Cardiovascular;  Laterality: N/A;  . COLONOSCOPY WITH PROPOFOL N/A 03/30/2017   Procedure: COLONOSCOPY WITH PROPOFOL;  Surgeon: Benancio Deeds, MD;  Location: [facility] ENDOSCOPY;  Service: Gastroenterology;  Laterality: N/A;  . ESOPHAGOGASTRODUODENOSCOPY (EGD) WITH PROPOFOL N/A 03/29/2017   Procedure: ESOPHAGOGASTRODUODENOSCOPY (EGD) WITH PROPOFOL;  Surgeon: Benancio Deeds, MD;  Location: [facility] ENDOSCOPY;  Service: Gastroenterology;  Laterality: N/A;  . GASTRIC BYPASS  03/2004  . GIVENS CAPSULE STUDY N/A 03/30/2017   Procedure: GIVENS CAPSULE STUDY;  Surgeon: Benancio Deeds, MD;  Location: [facility] ENDOSCOPY;  Service: Gastroenterology;  Laterality: N/A;  .  KNEE ARTHROSCOPY  08/09/2007 - left   07/15/2003 - right  . METATARSAL OSTEOTOMY  03/20/2010   right 1st MT; gastroc soleus recession  . NASAL SINUS SURGERY    . TENDON RELEASE  09/09/2011   Procedure: HEEL CORD LENGTHENING;  Surgeon: Toni Arthurs, MD;  Location: [facility];  Service: Orthopedics;  Laterality: Right;  Righ tachilles tendon lengthening  . TOE FUSION  07/12/2005   left  3rd toe PIP and DIP fusion  . TONSILLECTOMY AND ADENOIDECTOMY    . TOTAL KNEE ARTHROPLASTY Left 03/18/2015   Procedure: LEFT TOTAL KNEE ARTHROPLASTY;  Surgeon: Durene Romans, MD;  Location: [facility] ORS;  Service: Orthopedics;  Laterality: Left;    Current Outpatient Medications  Medication Sig Dispense Refill  . Accu-Chek FastClix Lancets MISC 2 (two) times daily. as directed    . ACCU-CHEK GUIDE test strip 2 (two) times daily.    . Ascorbic Acid (VITAMIN C) 1000 MG tablet Take 1,000 mg by mouth daily.    . Calcium Carb-Cholecalciferol (CALCIUM 600/VITAMIN D3 PO) Take 1 tablet by mouth 2 (two) times daily.    . cetirizine (ZYRTEC ALLERGY) 10 MG tablet Take 10 mg by mouth daily.     . clindamycin (CLEOCIN) 150 MG capsule Take 4 tablets by mouth 1 hour prior to seeing the dentist    . dofetilide (TIKOSYN) 250 MCG capsule Take 1 capsule (250 mcg total) by mouth 2 (two) times daily. 60 capsule 6  . esomeprazole (NEXIUM) 40 MG capsule Take 40 mg by mouth every evening.     . furosemide (LASIX) 40 MG tablet Take 1 tablet (40 mg total) by mouth daily. 30 tablet 6  . KLOR-CON M20 20 MEQ tablet TAKE 1 TABLET DAILY, AND 1 EXTRA TABLET ON DAYS       TAKING  METOLAZONE. 95 tablet 2  . losartan (COZAAR) 25 MG tablet Take 25 mg by mouth every evening.     . metolazone (ZAROXOLYN) 5 MG tablet TAKE 1 TABLET WEEKLY ON    SATURDAYS 12 tablet 3  . metoprolol succinate (TOPROL-XL) 50 MG 24 hr tablet TAKE 1 TABLET DAILY 90 tablet 3  . NON FORMULARY Apply 1 application topically as needed. Triamcinolone-Eucerin Compound Cream     . simvastatin (ZOCOR) 10 MG tablet Take 10 mg by mouth every evening.     . sitaGLIPtin-metformin (JANUMET) 50-1000 MG tablet Take 1 tablet by mouth 2 (two) times daily with a meal.     . tadalafil (CIALIS) 20 MG tablet Take 10 mg by mouth daily as needed for erectile dysfunction.     Carlena Hurl 20 MG TABS tablet TAKE 1 TABLET DAILY WITH   SUPPER 90 tablet 1   No current facility-administered  medications for this encounter.    Allergies  Allergen Reactions  . Nsaids Swelling and Other (See Comments)  . Penicillins Hives, Swelling and Other (See Comments)    NO REACTION LISTED Has patient had a PCN reaction causing immediate rash, facial/tongue/throat swelling, SOB or lightheadedness with hypotension: Yes Has patient had a PCN reaction causing severe rash involving mucus membranes or skin necrosis: No Has patient had a PCN reaction that required hospitalization No Has patient had a PCN reaction occurring within the last 10 years: No If all of the above answers are "NO", then may proceed with Cephalosporin use.  Has taken Keflex w/o issue      . Other Other (See Comments)    CYCLOOXYGENASE INHIBITORS-NO REACTION LISTED.    Social  History   Socioeconomic History  . Marital status: Widowed    Spouse name: Not on file  . Number of children: Not on file  . Years of education: Not on file  . Highest education level: Not on file  Occupational History  . Not on file  Tobacco Use  . Smoking status: Former Smoker    Packs/day: 1.00    Years: 2.00    Pack years: 2.00    Types: Cigars, Pipe, Cigarettes    Quit date: 01/31/2011    Years since quitting: 8.5  . Smokeless tobacco: Never Used  . Tobacco comment: smokes 1-2 cigars/week  Substance and Sexual Activity  . Alcohol use: Not Currently    Alcohol/week: 0.0 standard drinks  . Drug use: No  . Sexual activity: Not on file  Other Topics Concern  . Not on file  Social History Narrative  . Not on file   Social Determinants of Health   Financial Resource Strain:   . Difficulty of Paying Living Expenses:   Food Insecurity:   . Worried About Programme researcher, broadcasting/film/video in the Last Year:   . Barista in the Last Year:   Transportation Needs:   . Freight forwarder (Medical):   Marland Kitchen Lack of Transportation (Non-Medical):   Physical Activity:   . Days of Exercise per Week:   . Minutes of Exercise per Session:    Stress:   . Feeling of Stress :   Social Connections:   . Frequency of Communication with Friends and Family:   . Frequency of Social Gatherings with Friends and Family:   . Attends Religious Services:   . Active Member of Clubs or Organizations:   . Attends Banker Meetings:   Marland Kitchen Marital Status:   Intimate Partner Violence:   . Fear of Current or Ex-Partner:   . Emotionally Abused:   Marland Kitchen Physically Abused:   . Sexually Abused:     Family History  Problem Relation Age of Onset  . Alzheimer's disease Mother   . Diabetes Father   . Colon cancer Unknown        family history    ROS- All systems are reviewed and negative except as per the HPI above.  Physical Exam: Vitals:   08/14/19 0841  BP: 106/74  Pulse: 64  Weight: (!) 140.2 kg  Height: 6\' 5"  (1.956 m)    GEN- The patient is well appearing, alert and oriented x 3 today.   Head- normocephalic, atraumatic Eyes-  Sclera clear, conjunctiva pink Ears- hearing intact Oropharynx- clear Neck- supple  Lungs- Clear to ausculation bilaterally, normal work of breathing Heart- Regular rate and rhythm  GI- soft, NT, ND, + BS Extremities- no clubbing, cyanosis, or edema MS- no significant deformity or atrophy Skin- no rash or lesion Psych- euthymic mood, full affect Neuro- strength and sensation are intact  Wt Readings from Last 3 Encounters:  08/14/19 (!) 140.2 kg  01/29/19 (!) 151.7 kg  11/15/18 (!) 150.6 kg    EKG today demonstrates sinus rhythm, rate 64, QTc 468 ms    Epic records are reviewed at length today  Assessment and Plan:  1. Persistent atrial fibrillation Maintaining SR on dofetilide 250 mcg bid  Continue Xarelto for CHADS2VASC of 3 QTc stable  2.  Morbid obesity Body mass index is 36.64 kg/m. Congratulated on weight loss following keto diet  3.  OSA Compliant with CPAP  4.  Acute on chronic diastolic heart failure Stable with  Metolazone/lasix LLE improved with weight  loss  5.  S/p bioprosthetic AVR  per Dr. Percival Spanish  Prior endocarditis  6. Chronic anemia  Pending  stool card  Follow up with Dr Percival Spanish in  6 months with EKG for qtc, bmet/mag and I will see 6 months after that for same for tikosyn surveillance  afib clinic in 6 months  Butch Penny C. [place], [facility] [address] [place], [place] [address] [phone]

## 2019-09-04 ENCOUNTER — Telehealth: Payer: Self-pay

## 2019-09-04 NOTE — Telephone Encounter (Signed)
**Note De-Identified Philip [name] Obfuscation** The pts medication Ins info: [employer]  ID: [id number] BIN; [id number] PCN: [id number]  GRP: [id number]  I started a Tikosyn PA through covermymeds: Key: [id number]

## 2019-09-10 ENCOUNTER — Telehealth: Payer: Self-pay | Admitting: Cardiology

## 2019-09-10 NOTE — Telephone Encounter (Signed)
° °  Pt c/o medication issue:  1. Name of Medication:   dofetilide (TIKOSYN) 250 MCG capsule    2. How are you currently taking this medication (dosage and times per day)?   3. Are you having a reaction (difficulty breathing--STAT)?   4. What is your medication issue? Darlina Rumpf from [facility] department calling, she said she needs to verify pt's diagnosis to process prescription

## 2019-09-11 NOTE — Telephone Encounter (Signed)
**Note De-Identified Philip [name] Obfuscation** Letter received from [facility] [employer] dated 5/24 stating that they have approved coverage of the pts Tikosyn. Approval good from 05/24/20212 until 09/09/2020  [facility] has been made aware of this approval.

## 2019-09-11 NOTE — Telephone Encounter (Signed)
Spoke with [facility], PA has been approved.

## 2019-09-23 ENCOUNTER — Other Ambulatory Visit: Payer: Self-pay | Admitting: Nurse Practitioner

## 2019-11-27 ENCOUNTER — Telehealth (HOSPITAL_COMMUNITY): Payer: Self-pay

## 2019-11-27 MED ORDER — RIVAROXABAN 20 MG PO TABS: 20.0000 mg | ORAL_TABLET | Freq: Every day | ORAL | 0 refills | Status: DC

## 2019-11-27 MED ORDER — RIVAROXABAN 20 MG PO TABS: 20.0000 mg | ORAL_TABLET | Freq: Every day | ORAL | 3 refills | Status: DC

## 2019-11-27 NOTE — Telephone Encounter (Signed)
Patient called in regarding his Xarelto 20mg . I sent in his Xarelto with a 14 day supply to [facility] and 90 day supply with 3 Refills to [facility]. Patient verbalized understanding.

## 2019-11-28 ENCOUNTER — Telehealth: Payer: Self-pay | Admitting: Cardiology

## 2019-11-28 NOTE — Telephone Encounter (Signed)
Patient is requesting an order for an echocardiogram. 

## 2019-11-28 NOTE — Telephone Encounter (Signed)
Spoke with pt, he has had a cough for 4 weeks now. [place] was in [place] 3 of those weeks, he does not have covid. He went to the urgent care at his PCP office and they told him he was in heart failure and needed to be seen. He reports no congestion, just a productive cough of yellow to green sputum. He has no edema ibn his feet, legs or abdomen. He does get SOB with climbing stairs but that is his normal. He denies orthopnea and uses his CPAP, but his cough is worse with lying down. He has no atrial fib. His weight is down from 309 to 294 lb. Advised patient to double his furosemide to 40 mg twice daily for the next 3 days. He has not taken metolazone in about 5 months. He has a follow up appointment with dr Antoine Poche 12/11/19, there is no availability for an echo prior to appointment. He will call back after 3 days if symptoms get worse or change. Pt agreed with this plan.

## 2019-12-07 ENCOUNTER — Other Ambulatory Visit (HOSPITAL_COMMUNITY): Payer: Self-pay | Admitting: *Deleted

## 2019-12-07 MED ORDER — DOFETILIDE 250 MCG PO CAPS: 250.0000 ug | ORAL_CAPSULE | Freq: Two times a day (BID) | ORAL | 6 refills | Status: DC

## 2019-12-10 DIAGNOSIS — Z7189 Other specified counseling: Secondary | ICD-10-CM | POA: Insufficient documentation

## 2019-12-10 NOTE — Progress Notes (Signed)
Cardiology Office Note   Date:  12/11/2019   ID:  Philip [name], DOB 10-Oct-1960, MRN [id number]  PCP:  [name], MD  Cardiologist:   [name], MD   Chief Complaint  Patient presents with   Shortness of Breath      History of Present Illness: Philip [name] is a 59 y.o. male who presents for follow up of AVR and atrial fib. The patient had a bioprosthetic aortic valve replacement in 2005.  He had a follow up echo in Sept of 2018.    This showed a NL bioprosthesis.  He has had anemia and GI bleeding.  He had questionable AVMs seen on capsule endoscopy.  He has had DCCV and treatment and with Tikosyn.  He has been followed in the Atrial Fibrillation Clinic.    Since I last saw him he was in [place] and developed SOB.  He said that he developed a cough.  It was 118 degrees and agreed months and that may have contributed to his shortness of breath he thinks.  He walked 120 miles while he was there.  When he came home he did have a CT and I was able to review these results.  There was no pulmonary embolism.  He did have a mildly elevated BNP.  He called our office and we treated him with a brief course of increased diuretic for 3 days and he lost about 10 pounds.  He said he had some persistent cough after that but now his breathing is better.  He actually otherwise feels great.  He is not having any chest pressure, neck or arm discomfort.  He has had no palpitations, presyncope or syncope.  Has not had PND or orthopnea.  Unfortunately his wife died last fall for pulmonary fibrosis.  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    Hx of bacterial endocarditis 11/2003   Hypertension    Neuropathy    feet bilat    Obesity    Persistent atrial fibrillation (HCC)    Sleep apnea    uses CPAP nightly   Toe deformity 08/2011   right claw hallux    Past Surgical History:  Procedure Laterality Date   adenoid surgery      AORTIC VALVE REPLACEMENT  11/2003   CARDIOVERSION N/A 07/25/2017   Procedure: CARDIOVERSION;  Surgeon: Chilton Si, MD;  Location: [facility] ENDOSCOPY;  Service: Cardiovascular;  Laterality: N/A;   CARDIOVERSION N/A 09/07/2017   Procedure: CARDIOVERSION;  Surgeon: Chrystie Nose, MD;  Location: [facility] ENDOSCOPY;  Service: Cardiovascular;  Laterality: N/A;   CARDIOVERSION N/A 09/29/2017   Procedure: CARDIOVERSION;  Surgeon: Thurmon Fair, MD;  Location: [facility] ENDOSCOPY;  Service: Cardiovascular;  Laterality: N/A;   COLONOSCOPY WITH PROPOFOL N/A 03/30/2017   Procedure: COLONOSCOPY WITH PROPOFOL;  Surgeon: Benancio Deeds, MD;  Location: [facility] ENDOSCOPY;  Service: Gastroenterology;  Laterality: N/A;   ESOPHAGOGASTRODUODENOSCOPY (EGD) WITH PROPOFOL N/A 03/29/2017   Procedure: ESOPHAGOGASTRODUODENOSCOPY (EGD) WITH PROPOFOL;  Surgeon: Benancio Deeds, MD;  Location: [facility] ENDOSCOPY;  Service: Gastroenterology;  Laterality: N/A;   GASTRIC BYPASS  03/2004   GIVENS CAPSULE STUDY N/A 03/30/2017   Procedure: GIVENS CAPSULE STUDY;  Surgeon: Benancio Deeds, MD;  Location: [facility] ENDOSCOPY;  Service: Gastroenterology;  Laterality: N/A;   KNEE ARTHROSCOPY  08/09/2007 - left   07/15/2003 - right   METATARSAL OSTEOTOMY  03/20/2010   right 1st MT; gastroc soleus recession   NASAL SINUS SURGERY     TENDON RELEASE  09/09/2011   Procedure: HEEL CORD LENGTHENING;  Surgeon: Toni Arthurs, MD;  Location: [facility];  Service: Orthopedics;  Laterality: Right;  Righ tachilles tendon lengthening   TOE FUSION  07/12/2005   left 3rd toe PIP and DIP fusion   TONSILLECTOMY AND ADENOIDECTOMY      TOTAL KNEE ARTHROPLASTY Left 03/18/2015   Procedure: LEFT TOTAL KNEE ARTHROPLASTY;  Surgeon: Durene Romans, MD;  Location: [facility] ORS;  Service: Orthopedics;  Laterality: Left;     Current Outpatient Medications  Medication Sig Dispense Refill   Accu-Chek FastClix Lancets MISC 2 (two) times daily. as directed     ACCU-CHEK GUIDE test strip 2 (two) times daily.     Ascorbic Acid (VITAMIN C) 1000 MG tablet Take 1,000 mg by mouth daily.     benzonatate (TESSALON) 200 MG capsule Take 200 mg by mouth 3 (three) times daily as needed.     Calcium Carb-Cholecalciferol (CALCIUM 600/VITAMIN D3 PO) Take 1 tablet by mouth 2 (two) times daily.     cetirizine (ZYRTEC ALLERGY) 10 MG tablet Take 10 mg by mouth daily.      clindamycin (CLEOCIN) 150 MG capsule Take 4 tablets by mouth 1 hour prior to seeing the dentist     dofetilide (TIKOSYN) 250 MCG capsule Take 1 capsule (250 mcg total) by mouth 2 (two) times daily. 60 capsule 6   esomeprazole (NEXIUM) 40 MG capsule Take 40 mg by mouth every evening.      Ferrous Sulfate (IRON PO) Take by mouth.     fluconazole (DIFLUCAN) 100 MG tablet Take 100 mg by mouth daily.     furosemide (LASIX) 40 MG tablet Take 1 tablet (40 mg total) by mouth daily. 30 tablet 6   KLOR-CON M20 20 MEQ tablet TAKE 1 TABLET DAILY, AND 1 EXTRA TABLET ON DAYS       TAKING  METOLAZONE. 95 tablet 2   losartan (COZAAR) 25 MG tablet Take 25 mg by mouth every evening.      metoprolol succinate (TOPROL-XL) 50 MG 24 hr tablet TAKE 1 TABLET DAILY 90 tablet 3   rivaroxaban (XARELTO) 20 MG TABS tablet Take 1 tablet (20 mg total) by mouth daily with supper. 14 tablet 0   simvastatin (ZOCOR) 10 MG tablet Take 10 mg by mouth every evening.      sitaGLIPtin-metformin (JANUMET) 50-1000 MG tablet Take 1 tablet by mouth 2 (two) times daily with a meal.      tadalafil (CIALIS) 20 MG tablet Take 10 mg by mouth daily as needed for erectile dysfunction.      No current facility-administered  medications for this visit.    Allergies:   Nsaids, Penicillins, and Other    ROS:  Please see the history of present illness.   Otherwise, review of systems are positive for intermittent diarrhea.   All other systems are reviewed and negative.    PHYSICAL EXAM: VS:  BP 92/68    Pulse 77    Ht 6\' 5"  (1.956 m)    Wt 286  lb (129.7 kg)    SpO2 96%    BMI 33.91 kg/m  , BMI Body mass index is 33.91 kg/m. GENERAL:  Well appearing NECK:  No jugular venous distention, waveform within normal limits, carotid upstroke brisk and symmetric, no bruits, no thyromegaly LUNGS:  Clear to auscultation bilaterally CHEST:  Unremarkable HEART:  PMI not displaced or sustained,S1 and S2 within normal limits, no S3, no S4, no clicks, no rubs, 3 out of 6 apical systolic murmur radiating slightly at the aortic outflow tract, no diastolic murmurs murmurs ABD:  Flat, positive bowel sounds normal in frequency in pitch, no bruits, no rebound, no guarding, no midline pulsatile mass, no hepatomegaly, no splenomegaly EXT:  2 plus pulses throughout, no edema, no cyanosis no clubbing   EKG:  EKG  ordered today. The ekg ordered today demonstrates sinus rhythm, rate 77, borderline interventricular conduction delay, QTC prolonged but unchanged from previous.   Recent Labs: 08/14/2019: BUN 17; Creatinine, Ser 1.00; Magnesium 1.9; Potassium 4.4; Sodium 139    Lipid Panel No results found for: CHOL, TRIG, HDL, CHOLHDL, VLDL, LDLCALC, LDLDIRECT    Wt Readings from Last 3 Encounters:  12/11/19 286 lb (129.7 kg)  08/14/19 (!) 309 lb (140.2 kg)  01/29/19 (!) 334 lb 6.4 oz (151.7 kg)      Other studies Reviewed: Additional studies/ records that were reviewed today include:  [facility] records and results Review of the above records demonstrates:  Please see elsewhere in the note.     ASSESSMENT AND PLAN:  ATRIAL FIB:  Mr. STANFORD STRAUCH has a CHA2DS2 - VASc score of 2.   He is maintaining sinus rhythm.  He tolerates  anticoagulation.  He had normal potassium and his magnesium was okay recently.  QT is not prolonged.  He is tolerating Tikosyn.  Continue current therapy.   MITRAL REPAIR:    He had mild MR post repair.  I will check this again probably next year on an echo. .  AVR:   He had stable aortic valve replacement.  No change in therapy.   HTN: The blood pressure is controlled.  No change in therapy.   SOB: This may have been a volume issue.  It is since resolved.  We talked about as needed diuretics and the need to watch his potassium closely if he is having to use extra diuretics.  OBESITY:    He has lost a couple of 100 pounds from his peak.   COVID EDUCATION: He is vaccinated.   Current medicines are reviewed at length with the patient today.  The patient does not have concerns regarding medicines.  The following changes have been made:  None Labs/ tests ordered today include: None   Orders Placed This Encounter  Procedures   EKG 12-Lead     Disposition:   FU with me in one year.     Signed, Rollene Rotunda, MD  12/11/2019 2:31 PM    [facility]  '

## 2019-12-11 ENCOUNTER — Ambulatory Visit: Payer: BC Managed Care – PPO | Admitting: Cardiology

## 2019-12-11 ENCOUNTER — Other Ambulatory Visit: Payer: Self-pay

## 2019-12-11 ENCOUNTER — Encounter: Payer: Self-pay | Admitting: Cardiology

## 2019-12-11 VITALS — BP 92/68 | HR 77 | Ht 77.0 in | Wt 286.0 lb

## 2019-12-11 DIAGNOSIS — I340 Nonrheumatic mitral (valve) insufficiency: Secondary | ICD-10-CM | POA: Diagnosis not present

## 2019-12-11 DIAGNOSIS — I4819 Other persistent atrial fibrillation: Secondary | ICD-10-CM | POA: Diagnosis not present

## 2019-12-11 DIAGNOSIS — Z952 Presence of prosthetic heart valve: Secondary | ICD-10-CM | POA: Diagnosis not present

## 2019-12-11 DIAGNOSIS — I10 Essential (primary) hypertension: Secondary | ICD-10-CM

## 2019-12-11 DIAGNOSIS — Z7189 Other specified counseling: Secondary | ICD-10-CM

## 2019-12-11 NOTE — Patient Instructions (Signed)
Medication Instructions:  Your physician recommends that you continue on your current medications as directed. Please refer to the Current Medication list given to you today.  *If you need a refill on your cardiac medications before your next appointment, please call your pharmacy*  Lab Work: NONE  Testing/Procedures: NONE   Follow-Up: At [facility], you and your health needs are our priority.  As part of our continuing mission to provide you with exceptional heart care, we have created designated Provider Care Teams.  These Care Teams include your primary Cardiologist (physician) and Advanced Practice Providers (APPs -  Physician Assistants and Nurse Practitioners) who all work together to provide you with the care you need, when you need it.  We recommend signing up for the patient portal called "MyChart".  Sign up information is provided on this After Visit Summary.  MyChart is used to connect with patients for Virtual Visits (Telemedicine).  Patients are able to view lab/test results, encounter notes, upcoming appointments, etc.  Non-urgent messages can be sent to your provider as well.   To learn more about what you can do with MyChart, go to [email or web address].    Your next appointment:   6 month(s)  The format for your next appointment:   In Person  Provider:   You may see DR [facility]  or one of the following Advanced Practice Providers on your designated Care Team:    Theodore Demark, PA-C  Joni Reining, DNP, ANP  Cadence Fransico Michael, PA-C

## 2019-12-18 ENCOUNTER — Other Ambulatory Visit (HOSPITAL_COMMUNITY): Payer: Self-pay | Admitting: Nurse Practitioner

## 2020-02-04 ENCOUNTER — Ambulatory Visit: Payer: BC Managed Care – PPO | Admitting: Cardiology

## 2020-02-13 ENCOUNTER — Other Ambulatory Visit: Payer: Self-pay

## 2020-02-13 MED ORDER — METOPROLOL SUCCINATE ER 50 MG PO TB24: 50.0000 mg | ORAL_TABLET | Freq: Every day | ORAL | 3 refills | Status: DC

## 2020-06-02 ENCOUNTER — Telehealth: Payer: Self-pay | Admitting: *Deleted

## 2020-06-02 NOTE — Telephone Encounter (Signed)
   Primary Cardiologist: Rollene Rotunda, MD  Chart reviewed as part of pre-operative protocol coverage.  Hx: Endocarditis in 2005 s/p bioprosthetic AVR with root repolacement and MV repair, persistent AF (Dofetilide Rx), diabetes mellitus, prior GI bleeding, hypertension, neuropathy, OSA.  Echocardiogram 01/30/2019 EF 60-65, Gr II DD, mod LAE, MV repair with mild MR/mean gradient 2 mmHg, mild TR, normally functioning AVR with mean 5 mmHg/no AI.  Cardiac catheterization 11/26/2003 ([facility]) No coronary artery disease   RCRI: 0.4% perioperative risk of major cardiac event (low). DASI: at least 4.31 METs (good functional status).  CHA2DS2-VASc Score = 2  This indicates a 2.2% annual risk of stroke. The patient's score is based upon: CHF History: No HTN History: Yes Diabetes History: Yes Stroke History: No Vascular Disease History: No Age Score: 0 Gender Score: 0      Patient was contacted 06/02/2020 in reference to pre-operative risk assessment for pending surgery as outlined below.  Philip [name] was last seen on 12/11/19 by Dr. Rollene Rotunda.  Since that day, Philip [name] has done well.  He has no chest pain, shortness of breath, syncope, orthopnea.    Recommendations: 1. Therefore, based on ACC/AHA guidelines, the patient would be at acceptable risk for the planned procedure without further cardiovascular testing.   2. Will send note to PharmD for recommendations re: holding anticoagulation for the procedure.   Tereso Newcomer, PA-C 06/02/2020, 2:32 PM

## 2020-06-02 NOTE — Telephone Encounter (Signed)
Notes faxed to surgeon's office.  Call back staff: Please contact patient and let him know that he can hold Xarelto for 2-3 days prior to his procedure.  He should contact the surgeon's office to find out if they prefer 2 or 3 days to hold.  The surgeon will tell the patient when to resume Xarelto after the procedure.  I will remove this note form the preop pool. Tereso Newcomer, PA-C    06/02/2020 4:21 PM

## 2020-06-02 NOTE — Telephone Encounter (Signed)
Patient with diagnosis of afib on Xarelto for anticoagulation.    Procedure: right 5th toe amputation Date of procedure: ASAP  CHA2DS2-VASc Score = 3  The patient's score is based upon: CHF History: Yes (diastolic) HTN History: Yes Diabetes History: Yes Stroke History: No Vascular Disease History: No Age Score: 0 Gender Score: 0   CrCl >129mL/min Platelet count 163K  Per office protocol, patient can hold Xarelto for 2-3 days prior to procedure.

## 2020-06-02 NOTE — Telephone Encounter (Signed)
   [facility] Pre-operative Risk Assessment     Request for surgical clearance:  1. What type of surgery is being performed? RIGHT 5TH TOE AMPUTATION  2. When is this surgery scheduled? ASAP   3. What type of clearance is required (medical clearance vs. Pharmacy clearance to hold med vs. Both)? BOTH   4. Are there any medications that need to be held prior to surgery and how long?Climax AND FOR HOW LONG?    5. Practice name and name of physician performing surgery? [facility]   6. What is the office phone number? [phone]   7.   What is the office fax number? [place]   Anesthesia type (None, local, MAC, general) ? MAC

## 2020-06-02 NOTE — Telephone Encounter (Signed)
   Primary Cardiologist: Rollene Rotunda, MD  Chart reviewed as part of pre-operative protocol coverage.  Hx: Endocarditis in 2005 s/p bioprosthetic AVR with root repolacement and MV repair, persistent AF (Dofetilide Rx), diabetes mellitus, prior GI bleeding, hypertension, neuropathy, OSA.  Echocardiogram 01/30/2019 EF 60-65, Gr II DD, mod LAE, MV repair with mild MR/mean gradient 2 mmHg, mild TR, normally functioning AVR with mean 5 mmHg/no AI.  Cardiac catheterization 11/26/2003 ([facility]) No coronary artery disease   RCRI: 0.4% perioperative risk of major cardiac event (low). DASI: at least 4.31 METs (good functional status).  CHA2DS2-VASc Score = 2  This indicates a 2.2% annual risk of stroke. The patient's score is based upon: CHF History: No HTN History: Yes Diabetes History: Yes Stroke History: No Vascular Disease History: No Age Score: 0 Gender Score: 0      Patient was contacted 06/02/2020 in reference to pre-operative risk assessment for pending surgery as outlined below.  Philip [name] was last seen on 12/11/19 by Dr. Rollene Rotunda.  Since that day, Philip [name] has done well.  He has no chest pain, shortness of breath, syncope, orthopnea.    Recommendations: 1. Therefore, based on ACC/AHA guidelines, the patient would be at acceptable risk for the planned procedure without further cardiovascular testing.   2. Per office protocol, patient can hold Rivaroxaban (Xarelto) for 2-3 days prior to procedure and resume post op as soon as it is felt to be safe from a bleeding perspective.  Please call with questions.  Tereso Newcomer, PA-C 06/02/2020, 2:32 PM

## 2020-06-02 NOTE — Telephone Encounter (Signed)
Spoke with patient and made him aware of note below per Tereso Newcomer, PA-C. He verbalized his understanding and appreciation for the call.

## 2020-06-13 ENCOUNTER — Ambulatory Visit: Payer: BC Managed Care – PPO | Admitting: Cardiology

## 2020-06-22 NOTE — Progress Notes (Unsigned)
Cardiology Office Note   Date:  06/24/2020   ID:  Philip [name], DOB May 06, 1960, MRN [id number]  PCP:  [name], MD  Cardiologist:   Rollene Rotunda, MD   Chief Complaint  Patient presents with  . Atrial Fibrillation      History of Present Illness: Philip [name] is a 60 y.o. male who presents for follow up of AVR and atrial fib. The patient had a bioprosthetic aortic valve replacement in 2005.  He had a follow up echo in Sept of 2018.    This showed a NL bioprosthesis.  He has had anemia and GI bleeding.  He had questionable AVMs seen on capsule endoscopy.  He has had DCCV and treatment and with Tikosyn.  He has been followed in the Atrial Fibrillation Clinic.    Since I last saw him he did have half of his small toe on his right foot amputated because of a broken bone.  He has otherwise done well.  He denies any cardiovascular symptoms.  He is not exercising right now because of the surgery but he has not had any new cardiovascular complaints.  He feels much better than he did in years past.  He is not having any chest discomfort, neck or arm discomfort.  He is not having any new shortness of breath, PND or orthopnea.  He has had no weight gain or edema.   Past Medical History:  Diagnosis Date  . Arthritis   . Dental crowns present   . Diabetes mellitus    NIDDM  . Diabetic foot ulcer (HCC) 08/2011   right foot  . Fatty liver   . Gastric ulcer    no current problems  . GERD (gastroesophageal reflux disease)   . Gout   . 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 Hx of bacterial endocarditis 11/2003  . Hypertension   . Neuropathy    feet bilat   . Obesity   . Persistent atrial fibrillation (HCC)   . Sleep apnea    uses CPAP nightly  . 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 adenoid surgery     . AORTIC VALVE  REPLACEMENT  11/2003  . CARDIOVERSION N/A 07/25/2017   Procedure: CARDIOVERSION;  Surgeon: Chilton Si, MD;  Location: [facility] ENDOSCOPY;  Service: Cardiovascular;  Laterality: N/A;  . CARDIOVERSION N/A 09/07/2017   Procedure: CARDIOVERSION;  Surgeon: Chrystie Nose, MD;  Location: [facility] ENDOSCOPY;  Service: Cardiovascular;  Laterality: N/A;  . CARDIOVERSION N/A 09/29/2017   Procedure: CARDIOVERSION;  Surgeon: Thurmon Fair, MD;  Location: [facility] ENDOSCOPY;  Service: Cardiovascular;  Laterality: N/A;  . COLONOSCOPY WITH PROPOFOL N/A 03/30/2017   Procedure: COLONOSCOPY WITH PROPOFOL;  Surgeon: Benancio Deeds, MD;  Location: [facility] ENDOSCOPY;  Service: Gastroenterology;  Laterality: N/A;  . ESOPHAGOGASTRODUODENOSCOPY (EGD) WITH PROPOFOL N/A 03/29/2017   Procedure: ESOPHAGOGASTRODUODENOSCOPY (EGD) WITH PROPOFOL;  Surgeon: Benancio Deeds, MD;  Location: [facility] ENDOSCOPY;  Service: Gastroenterology;  Laterality: N/A;  . GASTRIC BYPASS  03/2004  . GIVENS CAPSULE STUDY N/A 03/30/2017   Procedure: GIVENS CAPSULE STUDY;  Surgeon: Benancio Deeds, MD;  Location: [facility] ENDOSCOPY;  Service: Gastroenterology;  Laterality: N/A;  . KNEE ARTHROSCOPY  08/09/2007 - left   07/15/2003 - right  . METATARSAL OSTEOTOMY  03/20/2010   right 1st MT; gastroc soleus  recession  . NASAL SINUS SURGERY    . TENDON RELEASE  09/09/2011   Procedure: HEEL CORD LENGTHENING;  Surgeon: Toni Arthurs, MD;  Location: [facility];  Service: Orthopedics;  Laterality: Right;  Righ tachilles tendon lengthening  . TOE FUSION  07/12/2005   left 3rd toe PIP and DIP fusion  . TONSILLECTOMY AND ADENOIDECTOMY    . TOTAL KNEE ARTHROPLASTY Left 03/18/2015   Procedure: LEFT TOTAL KNEE ARTHROPLASTY;  Surgeon: Durene Romans, MD;  Location: [facility] ORS;  Service: Orthopedics;  Laterality: Left;     Current Outpatient Medications  Medication Sig Dispense Refill  . Accu-Chek FastClix Lancets MISC 2 (two) times daily. as directed    .  ACCU-CHEK GUIDE test strip 2 (two) times daily.    . Ascorbic Acid (VITAMIN C) 1000 MG tablet Take 1,000 mg by mouth daily.    . Calcium Carb-Cholecalciferol (CALCIUM 600/VITAMIN D3 PO) Take 1 tablet by mouth 2 (two) times daily.    . cetirizine (ZYRTEC) 10 MG tablet Take 10 mg by mouth daily.     . clindamycin (CLEOCIN) 150 MG capsule Take 4 tablets by mouth 1 hour prior to seeing the dentist    . dofetilide (TIKOSYN) 250 MCG capsule Take 1 capsule (250 mcg total) by mouth 2 (two) times daily. 60 capsule 6  . esomeprazole (NEXIUM) 40 MG capsule Take 40 mg by mouth every evening.     . Ferrous Sulfate (IRON PO) Take by mouth.    . furosemide (LASIX) 40 MG tablet Take 1 tablet (40 mg total) by mouth daily. 30 tablet 6  . KLOR-CON M20 20 MEQ tablet TAKE 1 TABLET DAILY, AND 1 EXTRA TABLET ON DAYS       TAKING  METOLAZONE. 95 tablet 2  . losartan (COZAAR) 25 MG tablet Take 25 mg by mouth every evening.     . metoprolol succinate (TOPROL-XL) 50 MG 24 hr tablet Take 1 tablet (50 mg total) by mouth daily. Take with or immediately following a meal. 90 tablet 3  . rivaroxaban (XARELTO) 20 MG TABS tablet Take 1 tablet (20 mg total) by mouth daily with supper. 14 tablet 0  . simvastatin (ZOCOR) 10 MG tablet Take 10 mg by mouth every evening.     . sitaGLIPtin-metformin (JANUMET) 50-1000 MG tablet Take 1 tablet by mouth 2 (two) times daily with a meal.     . tadalafil (CIALIS) 20 MG tablet Take 10 mg by mouth daily as needed for erectile dysfunction.      No current facility-administered medications for this visit.    Allergies:   Nsaids, Penicillins, and Other    ROS:  Please see the history of present illness.   Otherwise, review of systems are positive for none.   All other systems are reviewed and negative.    PHYSICAL EXAM: VS:  BP 126/74   Pulse 65   Ht 6\' 4"  (1.93 m)   Wt (!) 307 lb 3.2 oz (139.3 kg)   SpO2 99%   BMI 37.39 kg/m  , BMI Body mass index is 37.39 kg/m. GENERAL:  Well  appearing NECK:  No jugular venous distention, waveform within normal limits, carotid upstroke brisk and symmetric, no bruits, no thyromegaly LUNGS:  Clear to auscultation bilaterally CHEST:  Well healed sternotomy scar. HEART:  PMI not displaced or sustained,S1 and S2 within normal limits, no S3, no S4, no clicks, no rubs, 3 3 out of 6 apical systolic murmur radiating slightly at the left upper tract,  no diastolic murmurs ABD:  Flat, positive bowel sounds normal in frequency in pitch, no bruits, no rebound, no guarding, no midline pulsatile mass, no hepatomegaly, no splenomegaly EXT:  2 plus pulses throughout, mild bilateral chronic leg swelling with venous stasis changes ,  no cyanosis no clubbing   EKG:  EKG is ordered today. The ekg ordered today demonstrates sinus rhythm, rate 65, borderline interventricular conduction delay, QTC prolonged but unchanged from previous.   Recent Labs: 08/14/2019: BUN 17; Creatinine, Ser 1.00; Magnesium 1.9; Potassium 4.4; Sodium 139    Lipid Panel No results found for: CHOL, TRIG, HDL, CHOLHDL, VLDL, LDLCALC, LDLDIRECT    Wt Readings from Last 3 Encounters:  06/24/20 (!) 307 lb 3.2 oz (139.3 kg)  12/11/19 286 lb (129.7 kg)  08/14/19 (!) 309 lb (140.2 kg)      Other studies Reviewed: Additional studies/ records that were reviewed today include:  Labs Review of the above records demonstrates:  Please see elsewhere in the note.     ASSESSMENT AND PLAN:  ATRIAL FIB:  Mr. Philip [name] has a CHA2DS2 - VASc score of 2.    He is maintaining sinus rhythm.  His QT is very slightly prolonged compared to previous.  I am that I check a magnesium level and basic metabolic profile today.  He has follow-up in the  Atrial Fib clinic.they can repeat an EKG at that time.     MITRAL REPAIR:    He had mild MR post repair.  I will follow up with an echocardiogram in October.   AVR:   He had stable aortic valve replacement.  As above.   HTN: The blood pressure is  well controlled.  No change in therapy.   OBESITY:     He has lost a couple of 200 pounds from his peak.   He feels great comparatively.       Current medicines are reviewed at length with the patient today.  The patient does not have concerns regarding medicines.  The following changes have been made:  None Labs/ tests ordered today include: None   Orders Placed This Encounter  Procedures  . Magnesium  . Basic metabolic panel  . EKG 12-Lead  . ECHOCARDIOGRAM COMPLETE     Disposition:   FU with me in October after his echo.  Forest Becker, MD  06/24/2020 3:38 PM    [facility]

## 2020-06-24 ENCOUNTER — Other Ambulatory Visit: Payer: Self-pay

## 2020-06-24 ENCOUNTER — Encounter: Payer: Self-pay | Admitting: Cardiology

## 2020-06-24 ENCOUNTER — Ambulatory Visit (INDEPENDENT_AMBULATORY_CARE_PROVIDER_SITE_OTHER): Payer: 59 | Admitting: Cardiology

## 2020-06-24 VITALS — BP 126/74 | HR 65 | Ht 76.0 in | Wt 307.2 lb

## 2020-06-24 DIAGNOSIS — R0602 Shortness of breath: Secondary | ICD-10-CM

## 2020-06-24 DIAGNOSIS — I10 Essential (primary) hypertension: Secondary | ICD-10-CM

## 2020-06-24 DIAGNOSIS — I340 Nonrheumatic mitral (valve) insufficiency: Secondary | ICD-10-CM | POA: Diagnosis not present

## 2020-06-24 DIAGNOSIS — I4819 Other persistent atrial fibrillation: Secondary | ICD-10-CM | POA: Diagnosis not present

## 2020-06-24 NOTE — Patient Instructions (Addendum)
Medication Instructions:  Your physician recommends that you continue on your current medications as directed. Please refer to the Current Medication list given to you today.  *If you need a refill on your cardiac medications before your next appointment, please call your pharmacy*  Lab Work: BMET/MAGNESIUM TODAY   If you have labs (blood work) drawn today and your tests are completely normal, you will receive your results only by: Marland Kitchen MyChart Message (if you have MyChart) OR . A paper copy in the mail If you have any lab test that is abnormal or we need to change your treatment, we will call you to review the results.  Testing/Procedures: Your physician has requested that you have an echocardiogram. Echocardiography is a painless test that uses sound waves to create images of your heart. It provides your doctor with information about the size and shape of your heart and how well your heart's chambers and valves are working. This procedure takes approximately one hour. There are no restrictions for this procedure. TO BE DONE IN October  [facility] AT [address]   Follow-Up: At [facility], you and your health needs are our priority.  As part of our continuing mission to provide you with exceptional heart care, we have created designated Provider Care Teams.  These Care Teams include your primary Cardiologist (physician) and Advanced Practice Providers (APPs -  Physician Assistants and Nurse Practitioners) who all work together to provide you with the care you need, when you need it.  We recommend signing up for the patient portal called "MyChart".  Sign up information is provided on this After Visit Summary.  MyChart is used to connect with patients for Virtual Visits (Telemedicine).  Patients are able to view lab/test results, encounter notes, upcoming appointments, etc.  Non-urgent messages can be sent to your provider as well.   To learn more about what you can do with  MyChart, go to [email or web address].    Your next appointment:   7 month(s) AFTER ECHO   The format for your next appointment:   In Person  Provider:   You may see Rollene Rotunda, MD or one of the following Advanced Practice Providers on your designated Care Team:    Theodore Demark, PA-C  Joni Reining, DNP, ANP   *

## 2020-06-25 LAB — BASIC METABOLIC PANEL
BUN/Creatinine Ratio: 19 (ref 9–20)
BUN: 22 mg/dL (ref 6–24)
CO2: 24 mmol/L (ref 20–29)
Calcium: 9.4 mg/dL (ref 8.7–10.2)
Chloride: 99 mmol/L (ref 96–106)
Creatinine, Ser: 1.13 mg/dL (ref 0.76–1.27)
Glucose: 157 mg/dL — ABNORMAL HIGH (ref 65–99)
Potassium: 4.4 mmol/L (ref 3.5–5.2)
Sodium: 141 mmol/L (ref 134–144)
eGFR: 75 mL/min/{1.73_m2} (ref >59)

## 2020-06-25 LAB — MAGNESIUM: Magnesium: 1.7 mg/dL (ref 1.6–2.3)

## 2020-08-04 ENCOUNTER — Other Ambulatory Visit (HOSPITAL_COMMUNITY): Payer: Self-pay | Admitting: *Deleted

## 2020-08-04 MED ORDER — DOFETILIDE 250 MCG PO CAPS: 250.0000 ug | ORAL_CAPSULE | Freq: Two times a day (BID) | ORAL | 1 refills | Status: DC

## 2020-08-13 ENCOUNTER — Other Ambulatory Visit: Payer: Self-pay

## 2020-08-13 ENCOUNTER — Encounter (HOSPITAL_COMMUNITY): Payer: Self-pay | Admitting: Nurse Practitioner

## 2020-08-13 ENCOUNTER — Ambulatory Visit (HOSPITAL_COMMUNITY)
Admission: RE | Admit: 2020-08-13 | Discharge: 2020-08-13 | Disposition: A | Discharge status: Home / Self Care | Payer: 59 | Source: Ambulatory Visit | Attending: Nurse Practitioner | Admitting: Nurse Practitioner

## 2020-08-13 VITALS — BP 118/76 | HR 66 | Ht 76.0 in | Wt 318.4 lb

## 2020-08-13 DIAGNOSIS — Z7901 Long term (current) use of anticoagulants: Secondary | ICD-10-CM | POA: Diagnosis not present

## 2020-08-13 DIAGNOSIS — I4819 Other persistent atrial fibrillation: Secondary | ICD-10-CM | POA: Insufficient documentation

## 2020-08-13 DIAGNOSIS — Z79899 Other long term (current) drug therapy: Secondary | ICD-10-CM | POA: Diagnosis not present

## 2020-08-13 DIAGNOSIS — Z7984 Long term (current) use of oral hypoglycemic drugs: Secondary | ICD-10-CM | POA: Insufficient documentation

## 2020-08-13 DIAGNOSIS — Z953 Presence of xenogenic heart valve: Secondary | ICD-10-CM | POA: Diagnosis not present

## 2020-08-13 DIAGNOSIS — Z87891 Personal history of nicotine dependence: Secondary | ICD-10-CM | POA: Insufficient documentation

## 2020-08-13 DIAGNOSIS — I5033 Acute on chronic diastolic (congestive) heart failure: Secondary | ICD-10-CM | POA: Insufficient documentation

## 2020-08-13 DIAGNOSIS — G4733 Obstructive sleep apnea (adult) (pediatric): Secondary | ICD-10-CM | POA: Insufficient documentation

## 2020-08-13 DIAGNOSIS — Z6838 Body mass index (BMI) 38.0-38.9, adult: Secondary | ICD-10-CM | POA: Insufficient documentation

## 2020-08-13 DIAGNOSIS — D6869 Other thrombophilia: Secondary | ICD-10-CM | POA: Diagnosis not present

## 2020-08-13 DIAGNOSIS — D649 Anemia, unspecified: Secondary | ICD-10-CM | POA: Insufficient documentation

## 2020-08-13 LAB — MAGNESIUM: Magnesium: 2.1 mg/dL (ref 1.7–2.4)

## 2020-08-13 NOTE — Progress Notes (Signed)
Primary Care Physician: [name], MD Primary Cardiologist: Antoine Poche Primary Electrophysiologist: Celso Amy is a 60 y.o. male with a history of persistent atrial fibrillation who presents for follow up in the [facility].  Since discharge for Tikosyn load in June 2019, the patient reports doing very well.  His energy level has improved.   He has not noted any afib. He remains on tikosyn and xareltofor a CHA2DS2VASc score of 3.  He reports going on a keto diet and losing 35 lbs in 2 weeks. He has now leveled off. SInce losing weight he has had significant improvement in his ankle edema.He is chronically anemic sine GI bleed in 2018. His PCP is planning s stool card to check for occult bleed.   F/u in afib clinic, 08/12/20 for Tikosyn surveillance. He is feeling well. Reports no afib. He feels well. Labs were checked one month ago, K+ stable but mag was low for Tikosyn at 1.7. Will recheck today. He is using CPAP.   Today, he  denies symptoms of palpitations, chest pain, shortness of breath, orthopnea, PND, lower extremity edema, dizziness, presyncope, syncope, snoring, daytime somnolence, bleeding, or neurologic sequela. The patient is tolerating medications without difficulties and is otherwise without complaint today.    Atrial Fibrillation Risk Factors:  he does have symptoms or diagnosis of sleep apnea. he is compliant with CPAP therapy.  he does not have a history of rheumatic fever.  he has a BMI of Body mass index is 38.76 kg/m.Marland Kitchen Filed Weights   08/13/20 0833  Weight: (!) 144.4 kg    LA size: 46   Atrial Fibrillation Management history:  Previous antiarrhythmic drugs: Flecainide -> Tikosyn  Previous cardioversions: 07/2017, 08/2017  Previous ablations: none  CHADS2VASC score: 3  Anticoagulation history: Xarelto   Past Medical History:  Diagnosis Date  . Arthritis   . Dental crowns present   . Diabetes mellitus     NIDDM  . Diabetic foot ulcer (HCC) 08/2011   right foot  . Fatty liver   . Gastric ulcer    no current problems  . GERD (gastroesophageal reflux disease)   . Gout   . 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 Hx of bacterial endocarditis 11/2003  . Hypertension   . Neuropathy    feet bilat   . Obesity   . Persistent atrial fibrillation (HCC)   . Sleep apnea    uses CPAP nightly  . Toe deformity 08/2011   right claw hallux   Past Surgical History:  Procedure Laterality Date  . adenoid surgery     . AORTIC VALVE REPLACEMENT  11/2003  . CARDIOVERSION N/A 07/25/2017   Procedure: CARDIOVERSION;  Surgeon: Chilton Si, MD;  Location: [facility] ENDOSCOPY;  Service: Cardiovascular;  Laterality: N/A;  . CARDIOVERSION N/A 09/07/2017   Procedure: CARDIOVERSION;  Surgeon: Chrystie Nose, MD;  Location: [facility] ENDOSCOPY;  Service: Cardiovascular;  Laterality: N/A;  . CARDIOVERSION N/A 09/29/2017   Procedure: CARDIOVERSION;  Surgeon: Thurmon Fair, MD;  Location: [facility] ENDOSCOPY;  Service: Cardiovascular;  Laterality: N/A;  . COLONOSCOPY WITH PROPOFOL N/A 03/30/2017   Procedure: COLONOSCOPY WITH PROPOFOL;  Surgeon: Benancio Deeds, MD;  Location: [facility] ENDOSCOPY;  Service: Gastroenterology;  Laterality: N/A;  . ESOPHAGOGASTRODUODENOSCOPY (EGD) WITH PROPOFOL N/A 03/29/2017   Procedure: ESOPHAGOGASTRODUODENOSCOPY (EGD) WITH PROPOFOL;  Surgeon: Benancio Deeds, MD;  Location: [facility]  ENDOSCOPY;  Service: Gastroenterology;  Laterality: N/A;  . GASTRIC BYPASS  03/2004  . GIVENS CAPSULE STUDY N/A 03/30/2017   Procedure: GIVENS CAPSULE STUDY;  Surgeon: Benancio Deeds, MD;  Location: [facility] ENDOSCOPY;  Service: Gastroenterology;  Laterality: N/A;  . KNEE ARTHROSCOPY  08/09/2007 - left   07/15/2003 - right  . METATARSAL OSTEOTOMY  03/20/2010   right 1st MT; gastroc soleus recession  . NASAL SINUS SURGERY    . TENDON  RELEASE  09/09/2011   Procedure: HEEL CORD LENGTHENING;  Surgeon: Toni Arthurs, MD;  Location: [facility];  Service: Orthopedics;  Laterality: Right;  Righ tachilles tendon lengthening  . TOE FUSION  07/12/2005   left 3rd toe PIP and DIP fusion  . TONSILLECTOMY AND ADENOIDECTOMY    . TOTAL KNEE ARTHROPLASTY Left 03/18/2015   Procedure: LEFT TOTAL KNEE ARTHROPLASTY;  Surgeon: Durene Romans, MD;  Location: [facility] ORS;  Service: Orthopedics;  Laterality: Left;    Current Outpatient Medications  Medication Sig Dispense Refill  . Ascorbic Acid (VITAMIN C) 1000 MG tablet Take 1,000 mg by mouth daily.    . Calcium Carb-Cholecalciferol (CALCIUM 600/VITAMIN D3 PO) Take 1 tablet by mouth 2 (two) times daily.    . cetirizine (ZYRTEC) 10 MG tablet Take 10 mg by mouth daily.     . clindamycin (CLEOCIN) 150 MG capsule Take 4 tablets by mouth 1 hour prior to seeing the dentist    . dofetilide (TIKOSYN) 250 MCG capsule Take 1 capsule (250 mcg total) by mouth 2 (two) times daily. 180 capsule 1  . esomeprazole (NEXIUM) 40 MG capsule Take 40 mg by mouth every evening.     . Ferrous Sulfate (IRON PO) Take by mouth.    . furosemide (LASIX) 40 MG tablet Take 1 tablet (40 mg total) by mouth daily. 30 tablet 6  . gabapentin (NEURONTIN) 300 MG capsule Take 300 mg by mouth daily.    Marland Kitchen KLOR-CON M20 20 MEQ tablet TAKE 1 TABLET DAILY, AND 1 EXTRA TABLET ON DAYS       TAKING  METOLAZONE. 95 tablet 2  . losartan (COZAAR) 25 MG tablet Take 25 mg by mouth every evening.     . metoprolol succinate (TOPROL-XL) 50 MG 24 hr tablet Take 1 tablet (50 mg total) by mouth daily. Take with or immediately following a meal. 90 tablet 3  . rivaroxaban (XARELTO) 20 MG TABS tablet Take 1 tablet (20 mg total) by mouth daily with supper. 14 tablet 0  . simvastatin (ZOCOR) 10 MG tablet Take 10 mg by mouth every evening.     . sitaGLIPtin-metformin (JANUMET) 50-1000 MG tablet Take 1 tablet by mouth 2 (two) times daily with a meal.      . tadalafil (CIALIS) 20 MG tablet Take 10 mg by mouth daily as needed for erectile dysfunction.     . Accu-Chek FastClix Lancets MISC 2 (two) times daily. as directed    . ACCU-CHEK GUIDE test strip 2 (two) times daily.     No current facility-administered medications for this encounter.    Allergies  Allergen Reactions  . Nsaids Swelling and Other (See Comments)  . Penicillins Hives, Swelling and Other (See Comments)    NO REACTION LISTED Has patient had a PCN reaction causing immediate rash, facial/tongue/throat swelling, SOB or lightheadedness with hypotension: Yes Has patient had a PCN reaction causing severe rash involving mucus membranes or skin necrosis: No Has patient had a PCN reaction that required hospitalization No Has patient had a  PCN reaction occurring within the last 10 years: No If all of the above answers are "NO", then may proceed with Cephalosporin use.  Has taken Keflex w/o issue      . Other Other (See Comments)    CYCLOOXYGENASE INHIBITORS-NO REACTION LISTED.    Social History   Socioeconomic History  . Marital status: Widowed    Spouse name: Not on file  . Number of children: Not on file  . Years of education: Not on file  . Highest education level: Not on file  Occupational History  . Not on file  Tobacco Use  . Smoking status: Former Smoker    Packs/day: 1.00    Years: 2.00    Pack years: 2.00    Types: Cigars, Pipe, Cigarettes    Quit date: 01/31/2011    Years since quitting: 9.5  . Smokeless tobacco: Never Used  . Tobacco comment: smokes 1-2 cigars/week  Vaping Use  . Vaping Use: Never used  Substance and Sexual Activity  . Alcohol use: Not Currently    Alcohol/week: 0.0 standard drinks  . Drug use: No  . Sexual activity: Not on file  Other Topics Concern  . Not on file  Social History Narrative  . Not on file   Social Determinants of Health   Financial Resource Strain: Not on file  Food Insecurity: Not on file   Transportation Needs: Not on file  Physical Activity: Not on file  Stress: Not on file  Social Connections: Not on file  Intimate Partner Violence: Not on file    Family History  Problem Relation Age of Onset  . Alzheimer's disease Mother   . Diabetes Father   . Colon cancer Unknown        family history    ROS- All systems are reviewed and negative except as per the HPI above.  Physical Exam: Vitals:   08/13/20 0833  Weight: (!) 144.4 kg  Height: 6\' 4"  (1.93 m)    GEN- The patient is well appearing, alert and oriented x 3 today.   Head- normocephalic, atraumatic Eyes-  Sclera clear, conjunctiva pink Ears- hearing intact Oropharynx- clear Neck- supple  Lungs- Clear to ausculation bilaterally, normal work of breathing Heart- Regular rate and rhythm  GI- soft, NT, ND, + BS Extremities- no clubbing, cyanosis, or edema MS- no significant deformity or atrophy Skin- no rash or lesion Psych- euthymic mood, full affect Neuro- strength and sensation are intact  Wt Readings from Last 3 Encounters:  08/13/20 (!) 144.4 kg  06/24/20 (!) 139.3 kg  12/11/19 129.7 kg    EKG today demonstrates sinus rhythm, rate 66, pr int 134 ms, qrs int 108 ms, QTc 490 ms    Epic records are reviewed at length today  Assessment and Plan:  1. Persistent atrial fibrillation Maintaining SR on dofetilide 250 mcg bid  Continue Xarelto for CHADS2VASC of 3 QTc stable  2.  Morbid obesity Body mass index is 38.76 kg/m.  he does exercise on a regular basis  3.  OSA Compliant with CPAP  4.  Acute on chronic diastolic heart failure Stable with  Metolazone/lasix  5.  S/p bioprosthetic AVR  per Dr. 12/13/19  Prior endocarditis Echo pending in the fall   6. Chronic anemia  Stable   Mag today, f/u in one year, he sees Dr. Antoine Poche eevery 6 months   Antoine Poche. [facility] [address] [place], [place] [phone]

## 2020-09-28 ENCOUNTER — Other Ambulatory Visit (HOSPITAL_COMMUNITY): Payer: Self-pay | Admitting: Nurse Practitioner

## 2020-10-01 ENCOUNTER — Other Ambulatory Visit (HOSPITAL_COMMUNITY): Payer: Self-pay | Admitting: *Deleted

## 2020-10-30 DIAGNOSIS — M19012 Primary osteoarthritis, left shoulder: Secondary | ICD-10-CM | POA: Insufficient documentation

## 2020-12-17 ENCOUNTER — Ambulatory Visit (HOSPITAL_COMMUNITY)
Admission: RE | Admit: 2020-12-17 | Discharge: 2020-12-17 | Disposition: A | Discharge status: Home / Self Care | Payer: 59 | Source: Ambulatory Visit | Attending: Nurse Practitioner | Admitting: Nurse Practitioner

## 2020-12-17 ENCOUNTER — Encounter (HOSPITAL_COMMUNITY): Payer: Self-pay | Admitting: Nurse Practitioner

## 2020-12-17 ENCOUNTER — Other Ambulatory Visit: Payer: Self-pay

## 2020-12-17 VITALS — BP 120/78 | HR 89 | Ht 76.0 in | Wt 317.2 lb

## 2020-12-17 DIAGNOSIS — I110 Hypertensive heart disease with heart failure: Secondary | ICD-10-CM | POA: Diagnosis not present

## 2020-12-17 DIAGNOSIS — G4733 Obstructive sleep apnea (adult) (pediatric): Secondary | ICD-10-CM | POA: Insufficient documentation

## 2020-12-17 DIAGNOSIS — Z881 Allergy status to other antibiotic agents status: Secondary | ICD-10-CM | POA: Insufficient documentation

## 2020-12-17 DIAGNOSIS — Z833 Family history of diabetes mellitus: Secondary | ICD-10-CM | POA: Insufficient documentation

## 2020-12-17 DIAGNOSIS — Z9884 Bariatric surgery status: Secondary | ICD-10-CM | POA: Diagnosis not present

## 2020-12-17 DIAGNOSIS — D649 Anemia, unspecified: Secondary | ICD-10-CM | POA: Diagnosis not present

## 2020-12-17 DIAGNOSIS — Z87891 Personal history of nicotine dependence: Secondary | ICD-10-CM | POA: Diagnosis not present

## 2020-12-17 DIAGNOSIS — Z6838 Body mass index (BMI) 38.0-38.9, adult: Secondary | ICD-10-CM | POA: Diagnosis not present

## 2020-12-17 DIAGNOSIS — Z886 Allergy status to analgesic agent status: Secondary | ICD-10-CM | POA: Diagnosis not present

## 2020-12-17 DIAGNOSIS — Z880 Allergy status to penicillin: Secondary | ICD-10-CM | POA: Insufficient documentation

## 2020-12-17 DIAGNOSIS — I4819 Other persistent atrial fibrillation: Secondary | ICD-10-CM | POA: Diagnosis present

## 2020-12-17 DIAGNOSIS — Z953 Presence of xenogenic heart valve: Secondary | ICD-10-CM | POA: Insufficient documentation

## 2020-12-17 DIAGNOSIS — D6869 Other thrombophilia: Secondary | ICD-10-CM | POA: Diagnosis not present

## 2020-12-17 DIAGNOSIS — Z7984 Long term (current) use of oral hypoglycemic drugs: Secondary | ICD-10-CM | POA: Diagnosis not present

## 2020-12-17 DIAGNOSIS — Z79899 Other long term (current) drug therapy: Secondary | ICD-10-CM | POA: Insufficient documentation

## 2020-12-17 DIAGNOSIS — Z7901 Long term (current) use of anticoagulants: Secondary | ICD-10-CM | POA: Insufficient documentation

## 2020-12-17 DIAGNOSIS — I5033 Acute on chronic diastolic (congestive) heart failure: Secondary | ICD-10-CM | POA: Diagnosis not present

## 2020-12-17 NOTE — Progress Notes (Signed)
Primary Care Physician: Angelica ChessmanAguiar, Rafaela M, MD Primary Cardiologist: Antoine PocheHochrein Primary Electrophysiologist: Celso AmyAllred  Philip [name] is a 60 y.o. male with a history of persistent atrial fibrillation who presents for follow up [facility] the [facility].  Since discharge for Tikosyn load in June 2019, the patient reports doing very well.  His energy level has improved.   He has not noted any afib. He remains on tikosyn and xareltofor a CHA2DS2VASc score of 3.  He reports going on a keto diet and losing 35 lbs in 2 weeks. He has now leveled off. SInce losing weight he has had significant improvement in his ankle edema.He is chronically anemic sine GI bleed in 2018. His PCP is planning s stool card to check for occult bleed.   F/u in afib clinic, 08/12/20 for Tikosyn surveillance. He is feeling well. Reports no afib. He feels well. Labs were checked one month ago, K+ stable but mag was low for Tikosyn at 1.7. Will recheck today. He is using CPAP.   F/u in afib clinic, 12/17/20. He asked to be seen as he had noted increase of fluid retention and return of afib around 2 weeks ago. He is being complaint with tikosyn. He is now living with [place] father in [place] but this is not a permanent change. He did have an ER visit  in [place], visiting  his girlfriend and took a Delta 8, marijuana gummy, first time use,  and became very sedated to the point he was not answering questions, so EMS was called.  CAN view visit in Epic as well as labs on 8/26. He was given IV fluids and became more responsive and d/c home. He was told he was in afib then. He took metolazone x one last week but did not lose that much fluid. He feels that 300 lbs is his dry weight. According to his last weight here not much change in weight. States no missed anticoagulin for at least 3 weeks.   Today, he  denies symptoms of palpitations, chest pain, shortness of breath, orthopnea, PND, lower extremity edema, dizziness,  presyncope, syncope, snoring, daytime somnolence, bleeding, or neurologic sequela. The patient is tolerating medications without difficulties and is otherwise without complaint today.    Atrial Fibrillation Risk Factors:  he does have symptoms or diagnosis of sleep apnea. he is compliant with CPAP therapy.  he does not have a history of rheumatic fever.  he has a BMI of Body mass index is 38.61 kg/m.Marland Kitchen. 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   Hx of bacterial endocarditis 11/2003   Hypertension    Neuropathy    feet bilat    Obesity    Persistent atrial fibrillation (HCC)  Sleep apnea    uses CPAP nightly   Toe deformity 08/2011   right claw hallux   Past Surgical History:  Procedure Laterality Date   adenoid surgery      AORTIC VALVE REPLACEMENT  11/2003   CARDIOVERSION N/A 07/25/2017   Procedure: CARDIOVERSION;  Surgeon: Chilton Si, MD;  Location: [facility] ENDOSCOPY;  Service: Cardiovascular;  Laterality: N/A;   CARDIOVERSION N/A 09/07/2017   Procedure: CARDIOVERSION;  Surgeon: Chrystie Nose, MD;  Location: [facility] ENDOSCOPY;  Service: Cardiovascular;  Laterality: N/A;   CARDIOVERSION N/A 09/29/2017   Procedure: CARDIOVERSION;  Surgeon: Thurmon Fair, MD;  Location: [facility] ENDOSCOPY;  Service: Cardiovascular;  Laterality: N/A;    COLONOSCOPY WITH PROPOFOL N/A 03/30/2017   Procedure: COLONOSCOPY WITH PROPOFOL;  Surgeon: Benancio Deeds, MD;  Location: [facility] ENDOSCOPY;  Service: Gastroenterology;  Laterality: N/A;   ESOPHAGOGASTRODUODENOSCOPY (EGD) WITH PROPOFOL N/A 03/29/2017   Procedure: ESOPHAGOGASTRODUODENOSCOPY (EGD) WITH PROPOFOL;  Surgeon: Benancio Deeds, MD;  Location: [facility] ENDOSCOPY;  Service: Gastroenterology;  Laterality: N/A;   GASTRIC BYPASS  03/2004   GIVENS CAPSULE STUDY N/A 03/30/2017   Procedure: GIVENS CAPSULE STUDY;  Surgeon: Benancio Deeds, MD;  Location: [facility] ENDOSCOPY;  Service: Gastroenterology;  Laterality: N/A;   KNEE ARTHROSCOPY  08/09/2007 - left   07/15/2003 - right   METATARSAL OSTEOTOMY  03/20/2010   right 1st MT; gastroc soleus recession   NASAL SINUS SURGERY     TENDON RELEASE  09/09/2011   Procedure: HEEL CORD LENGTHENING;  Surgeon: Toni Arthurs, MD;  Location: [facility];  Service: Orthopedics;  Laterality: Right;  Righ tachilles tendon lengthening   TOE FUSION  07/12/2005   left 3rd toe PIP and DIP fusion   TONSILLECTOMY AND ADENOIDECTOMY     TOTAL KNEE ARTHROPLASTY Left 03/18/2015   Procedure: LEFT TOTAL KNEE ARTHROPLASTY;  Surgeon: Durene Romans, MD;  Location: [facility];  Service: Orthopedics;  Laterality: Left;    Current Outpatient Medications  Medication Sig Dispense Refill   Accu-Chek FastClix Lancets MISC 2 (two) times daily. as directed     ACCU-CHEK GUIDE test strip 2 (two) times daily.     Ascorbic Acid (VITAMIN C) 1000 MG tablet Take 1,000 mg by mouth daily.     Calcium Carb-Cholecalciferol (CALCIUM 600/VITAMIN D3 PO) Take 1 tablet by mouth 2 (two) times daily.     cetirizine (ZYRTEC) 10 MG tablet Take 10 mg by mouth daily.      clindamycin (CLEOCIN) 150 MG capsule Take 4 tablets by mouth 1 hour prior to seeing the dentist     dofetilide (TIKOSYN) 250 MCG capsule Take 1 capsule (250 mcg total) by mouth 2 (two) times daily. 180 capsule 1   esomeprazole  (NEXIUM) 40 MG capsule Take 40 mg by mouth every evening.      Ferrous Sulfate (IRON PO) Take 1 tablet by mouth every evening.     furosemide (LASIX) 40 MG tablet Take 1 tablet (40 mg total) by mouth daily. 30 tablet 6   KLOR-CON M20 20 MEQ tablet TAKE 1 TABLET DAILY, AND 1 EXTRA TABLET ON DAYS       TAKING  METOLAZONE. 95 tablet 2   losartan (COZAAR) 25 MG tablet Take 25 mg by mouth every evening.      metoprolol succinate (TOPROL-XL) 50 MG 24 hr tablet Take 1 tablet (50 mg total) by mouth daily. Take with or immediately following a meal. 90 tablet 3   nystatin-triamcinolone (MYCOLOG II) cream Apply to affected area BID-TID  simvastatin (ZOCOR) 10 MG tablet Take 10 mg by mouth every evening.      sitaGLIPtin-metformin (JANUMET) 50-1000 MG tablet Take 1 tablet by mouth 2 (two) times daily with a meal.      tadalafil (CIALIS) 20 MG tablet Take 10 mg by mouth daily as needed for erectile dysfunction.      XARELTO 20 MG TABS tablet TAKE 1 TABLET DAILY WITH   SUPPER 90 tablet 3   buPROPion (WELLBUTRIN XL) 150 MG 24 hr tablet Take 150 mg by mouth every morning.     No current facility-administered medications for this encounter.    Allergies  Allergen Reactions   Nsaids Swelling and Other (See Comments)   Penicillins Hives, Swelling and Other (See Comments)    NO REACTION LISTED Has patient had a PCN reaction causing immediate rash, facial/tongue/throat swelling, SOB or lightheadedness with hypotension: Yes Has patient had a PCN reaction causing severe rash involving mucus membranes or skin necrosis: No Has patient had a PCN reaction that required hospitalization No Has patient had a PCN reaction occurring within the last 10 years: No If all of the above answers are "NO", then may proceed with Cephalosporin use.  Has taken Keflex w/o issue       Other Other (See Comments)    CYCLOOXYGENASE INHIBITORS-NO REACTION LISTED.    Social History   Socioeconomic History   Marital status:  Widowed    Spouse name: Not on file   Number of children: Not on file   Years of education: Not on file   Highest education level: Not on file  Occupational History   Not on file  Tobacco Use   Smoking status: Former    Packs/day: 1.00    Years: 2.00    Pack years: 2.00    Types: Cigars, Pipe, Cigarettes    Quit date: 01/31/2011    Years since quitting: 9.8   Smokeless tobacco: Never   Tobacco comments:    smokes 1-2 cigars/week  Vaping Use   Vaping Use: Never used  Substance and Sexual Activity   Alcohol use: Not Currently    Alcohol/week: 0.0 standard drinks   Drug use: No   Sexual activity: Not on file  Other Topics Concern   Not on file  Social History Narrative   Not on file   Social Determinants of Health   Financial Resource Strain: Not on file  Food Insecurity: Not on file  Transportation Needs: Not on file  Physical Activity: Not on file  Stress: Not on file  Social Connections: Not on file  Intimate Partner Violence: Not on file    Family History  Problem Relation Age of Onset   Alzheimer's disease Mother    Diabetes Father    Colon cancer Unknown        family history    ROS- All systems are reviewed and negative except as per the HPI above.  Physical Exam: Vitals:   12/17/20 1426  BP: 120/78  Pulse: 89  Weight: (!) 143.9 kg  Height: 6\' 4"  (1.93 m)    GEN- The patient is well appearing, alert and oriented x 3 today.   Head- normocephalic, atraumatic Eyes-  Sclera clear, conjunctiva pink Ears- hearing intact Oropharynx- clear Neck- supple  Lungs- Clear to ausculation bilaterally, normal work of breathing Heart- Regular rate and rhythm  GI- soft, NT, ND, + BS Extremities- no clubbing, cyanosis, or edema MS- no significant deformity or atrophy Skin- no rash or lesion Psych- euthymic mood,  full affect Neuro- strength and sensation are intact  Wt Readings from Last 3 Encounters:  12/17/20 (!) 143.9 kg  08/13/20 (!) 144.4 kg  06/24/20  (!) 139.3 kg    EKG  today atrial fibrillation at 89 bpm    Epic records are reviewed at length today  Assessment and Plan:  1. Persistent atrial fibrillation DId well maintaining SR on dofetilide 250 mcg bid until 2 weeks ago when he went into afib  Will plan for cardioversion 9/9 Continue Xarelto for [facility] of 3 Please see labs in Epic, drawn 8/27 and stable for ongoing tikosyn use as well as for cardioversion   2.  Morbid obesity Body mass index is 38.61 kg/m. He does exercise on a regular basis  3.  OSA Compliant with CPAP  4.  Acute on chronic diastolic heart failure He feels he is up 17 lbs but his last weight in April is consistent with today's weight  He can increase lasix to 40 mg bid x 5 days and call with weight on Tuesday Double K+ dose as well with taking extra lasix .  Istat am of cardioversion   5.  S/p bioprosthetic AVR  per Dr. Antoine Poche  Prior endocarditis Echo pending in the fall   6. Chronic anemia  Stable    Is will see back one week after cardioverison   Lupita Leash C. [facility] [address] [place], [place] [address] [phone]

## 2020-12-17 NOTE — Patient Instructions (Signed)
Increase lasix to 40mg  twice a day -- call on Tuesday with update of swelling/weight  Cardioversion scheduled for Friday, September 9th  - Arrive at the 11-27-1999 and go to admitting at [facility] not eat or drink anything after midnight the night prior to your procedure.  - Take all your morning medication (except diabetic medications) with a sip of water prior to arrival.  - You will not be able to drive home after your procedure.  - Do NOT miss any doses of your blood thinner - if you should miss a dose please notify our office immediately.  - If you feel as if you go back into normal rhythm prior to scheduled cardioversion, please notify our office immediately. If your procedure is canceled in the cardioversion suite you will be charged a cancellation fee.  Patients will be asked to: to mask in public and hand hygiene (no longer quarantine) in the 3 days prior to surgery, to report if any COVID-19-like illness or household contacts to COVID-19 to determine need for testing

## 2020-12-17 NOTE — H&P (View-Only) (Signed)
  Primary Care Physician: Aguiar, Rafaela M, MD Primary Cardiologist: Hochrein Primary Electrophysiologist: Allred  Philip [name] is a 59 y.o. male with a history of persistent atrial fibrillation who presents for follow up in the [facility].  Since discharge for Tikosyn load in June 2019, the patient reports doing very well.  His energy level has improved.   He has not noted any afib. He remains on tikosyn and xareltofor a CHA2DS2VASc score of 3.  He reports going on a keto diet and losing 35 lbs in 2 weeks. He has now leveled off. SInce losing weight he has had significant improvement in his ankle edema.He is chronically anemic sine GI bleed in 2018. His PCP is planning s stool card to check for occult bleed.   F/u in afib clinic, 08/12/20 for Tikosyn surveillance. He is feeling well. Reports no afib. He feels well. Labs were checked one month ago, K+ stable but mag was low for Tikosyn at 1.7. Will recheck today. He is using CPAP.   F/u in afib clinic, 12/17/20. He asked to be seen as he had noted increase of fluid retention and return of afib around 2 weeks ago. He is being complaint with tikosyn. He is now living with his father in [place] but this is not a permanent change. He did have an ER visit  in [place], visiting  his girlfriend and took a Delta 8, marijuana gummy, first time use,  and became very sedated to the point he was not answering questions, so EMS was called.  CAN view visit in Epic as well as labs on 8/26. He was given IV fluids and became more responsive and d/c home. He was told he was in afib then. He took metolazone x one last week but did not lose that much fluid. He feels that 300 lbs is his dry weight. According to his last weight here not much change in weight. States no missed anticoagulin for at least 3 weeks.   Today, he  denies symptoms of palpitations, chest pain, shortness of breath, orthopnea, PND, lower extremity edema, dizziness,  presyncope, syncope, snoring, daytime somnolence, bleeding, or neurologic sequela. The patient is tolerating medications without difficulties and is otherwise without complaint today.    Atrial Fibrillation Risk Factors:  he does have symptoms or diagnosis of sleep apnea. he is compliant with CPAP therapy.  he does not have a history of rheumatic fever.  he has a BMI of Body mass index is 38.61 kg/m.. 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   Hx of bacterial endocarditis 11/2003   Hypertension    Neuropathy    feet bilat    Obesity    Persistent atrial fibrillation (HCC)      Sleep apnea    uses CPAP nightly   Toe deformity 08/2011   right claw hallux   Past Surgical History:  Procedure Laterality Date   adenoid surgery      AORTIC VALVE REPLACEMENT  11/2003   CARDIOVERSION N/A 07/25/2017   Procedure: CARDIOVERSION;  Surgeon: Chilton Si, MD;  Location: [facility] ENDOSCOPY;  Service: Cardiovascular;  Laterality: N/A;   CARDIOVERSION N/A 09/07/2017   Procedure: CARDIOVERSION;  Surgeon: Chrystie Nose, MD;  Location: [facility] ENDOSCOPY;  Service: Cardiovascular;  Laterality: N/A;   CARDIOVERSION N/A 09/29/2017   Procedure: CARDIOVERSION;  Surgeon: Thurmon Fair, MD;  Location: [facility] ENDOSCOPY;  Service: Cardiovascular;  Laterality: N/A;    COLONOSCOPY WITH PROPOFOL N/A 03/30/2017   Procedure: COLONOSCOPY WITH PROPOFOL;  Surgeon: Benancio Deeds, MD;  Location: [facility] ENDOSCOPY;  Service: Gastroenterology;  Laterality: N/A;   ESOPHAGOGASTRODUODENOSCOPY (EGD) WITH PROPOFOL N/A 03/29/2017   Procedure: ESOPHAGOGASTRODUODENOSCOPY (EGD) WITH PROPOFOL;  Surgeon: Benancio Deeds, MD;  Location: [facility] ENDOSCOPY;  Service: Gastroenterology;  Laterality: N/A;   GASTRIC BYPASS  03/2004   GIVENS CAPSULE STUDY N/A 03/30/2017   Procedure: GIVENS CAPSULE STUDY;  Surgeon: Benancio Deeds, MD;  Location: [facility] ENDOSCOPY;  Service: Gastroenterology;  Laterality: N/A;   KNEE ARTHROSCOPY  08/09/2007 - left   07/15/2003 - right   METATARSAL OSTEOTOMY  03/20/2010   right 1st MT; gastroc soleus recession   NASAL SINUS SURGERY     TENDON RELEASE  09/09/2011   Procedure: HEEL CORD LENGTHENING;  Surgeon: Toni Arthurs, MD;  Location: [facility];  Service: Orthopedics;  Laterality: Right;  Righ tachilles tendon lengthening   TOE FUSION  07/12/2005   left 3rd toe PIP and DIP fusion   TONSILLECTOMY AND ADENOIDECTOMY     TOTAL KNEE ARTHROPLASTY Left 03/18/2015   Procedure: LEFT TOTAL KNEE ARTHROPLASTY;  Surgeon: Durene Romans, MD;  Location: [facility] ORS;  Service: Orthopedics;  Laterality: Left;    Current Outpatient Medications  Medication Sig Dispense Refill   Accu-Chek FastClix Lancets MISC 2 (two) times daily. as directed     ACCU-CHEK GUIDE test strip 2 (two) times daily.     Ascorbic Acid (VITAMIN C) 1000 MG tablet Take 1,000 mg by mouth daily.     Calcium Carb-Cholecalciferol (CALCIUM 600/VITAMIN D3 PO) Take 1 tablet by mouth 2 (two) times daily.     cetirizine (ZYRTEC) 10 MG tablet Take 10 mg by mouth daily.      clindamycin (CLEOCIN) 150 MG capsule Take 4 tablets by mouth 1 hour prior to seeing the dentist     dofetilide (TIKOSYN) 250 MCG capsule Take 1 capsule (250 mcg total) by mouth 2 (two) times daily. 180 capsule 1   esomeprazole  (NEXIUM) 40 MG capsule Take 40 mg by mouth every evening.      Ferrous Sulfate (IRON PO) Take 1 tablet by mouth every evening.     furosemide (LASIX) 40 MG tablet Take 1 tablet (40 mg total) by mouth daily. 30 tablet 6   KLOR-CON M20 20 MEQ tablet TAKE 1 TABLET DAILY, AND 1 EXTRA TABLET ON DAYS       TAKING  METOLAZONE. 95 tablet 2   losartan (COZAAR) 25 MG tablet Take 25 mg by mouth every evening.      metoprolol succinate (TOPROL-XL) 50 MG 24 hr tablet Take 1 tablet (50 mg total) by mouth daily. Take with or immediately following a meal. 90 tablet 3   nystatin-triamcinolone (MYCOLOG II) cream Apply to affected area BID-TID  simvastatin (ZOCOR) 10 MG tablet Take 10 mg by mouth every evening.      sitaGLIPtin-metformin (JANUMET) 50-1000 MG tablet Take 1 tablet by mouth 2 (two) times daily with a meal.      tadalafil (CIALIS) 20 MG tablet Take 10 mg by mouth daily as needed for erectile dysfunction.      XARELTO 20 MG TABS tablet TAKE 1 TABLET DAILY WITH   SUPPER 90 tablet 3   buPROPion (WELLBUTRIN XL) 150 MG 24 hr tablet Take 150 mg by mouth every morning.     No current facility-administered medications for this encounter.    Allergies  Allergen Reactions   Nsaids Swelling and Other (See Comments)   Penicillins Hives, Swelling and Other (See Comments)    NO REACTION LISTED Has patient had a PCN reaction causing immediate rash, facial/tongue/throat swelling, SOB or lightheadedness with hypotension: Yes Has patient had a PCN reaction causing severe rash involving mucus membranes or skin necrosis: No Has patient had a PCN reaction that required hospitalization No Has patient had a PCN reaction occurring within the last 10 years: No If all of the above answers are "NO", then may proceed with Cephalosporin use.  Has taken Keflex w/o issue       Other Other (See Comments)    CYCLOOXYGENASE INHIBITORS-NO REACTION LISTED.    Social History   Socioeconomic History   Marital status:  Widowed    Spouse name: Not on file   Number of children: Not on file   Years of education: Not on file   Highest education level: Not on file  Occupational History   Not on file  Tobacco Use   Smoking status: Former    Packs/day: 1.00    Years: 2.00    Pack years: 2.00    Types: Cigars, Pipe, Cigarettes    Quit date: 01/31/2011    Years since quitting: 9.8   Smokeless tobacco: Never   Tobacco comments:    smokes 1-2 cigars/week  Vaping Use   Vaping Use: Never used  Substance and Sexual Activity   Alcohol use: Not Currently    Alcohol/week: 0.0 standard drinks   Drug use: No   Sexual activity: Not on file  Other Topics Concern   Not on file  Social History Narrative   Not on file   Social Determinants of Health   Financial Resource Strain: Not on file  Food Insecurity: Not on file  Transportation Needs: Not on file  Physical Activity: Not on file  Stress: Not on file  Social Connections: Not on file  Intimate Partner Violence: Not on file    Family History  Problem Relation Age of Onset   Alzheimer's disease Mother    Diabetes Father    Colon cancer Unknown        family history    ROS- All systems are reviewed and negative except as per the HPI above.  Physical Exam: Vitals:   12/17/20 1426  BP: 120/78  Pulse: 89  Weight: (!) 143.9 kg  Height: 6\' 4"  (1.93 m)    GEN- The patient is well appearing, alert and oriented x 3 today.   Head- normocephalic, atraumatic Eyes-  Sclera clear, conjunctiva pink Ears- hearing intact Oropharynx- clear Neck- supple  Lungs- Clear to ausculation bilaterally, normal work of breathing Heart- Regular rate and rhythm  GI- soft, NT, ND, + BS Extremities- no clubbing, cyanosis, or edema MS- no significant deformity or atrophy Skin- no rash or lesion Psych- euthymic mood,  full affect Neuro- strength and sensation are intact  Wt Readings from Last 3 Encounters:  12/17/20 (!) 143.9 kg  08/13/20 (!) 144.4 kg  06/24/20  (!) 139.3 kg    EKG  today atrial fibrillation at 89 bpm    Epic records are reviewed at length today  Assessment and Plan:  1. Persistent atrial fibrillation DId well maintaining SR on dofetilide 250 mcg bid until 2 weeks ago when he went into afib  Will plan for cardioversion 9/9 Continue Xarelto for [facility] of 3 Please see labs in Epic, drawn 8/27 and stable for ongoing tikosyn use as well as for cardioversion   2.  Morbid obesity Body mass index is 38.61 kg/m. He does exercise on a regular basis  3.  OSA Compliant with CPAP  4.  Acute on chronic diastolic heart failure He feels he is up 17 lbs but his last weight in April is consistent with today's weight  He can increase lasix to 40 mg bid x 5 days and call with weight on Tuesday Double K+ dose as well with taking extra lasix .  Istat am of cardioversion   5.  S/p bioprosthetic AVR  per Dr. Antoine Poche  Prior endocarditis Echo pending in the fall   6. Chronic anemia  Stable    Is will see back one week after cardioverison   Lupita Leash C[facility] [address] [place], [place] [address] [phone]

## 2020-12-26 ENCOUNTER — Ambulatory Visit (HOSPITAL_COMMUNITY): Payer: 59 | Admitting: Anesthesiology

## 2020-12-26 ENCOUNTER — Encounter (HOSPITAL_COMMUNITY)
Admission: RE | Disposition: A | Discharge status: Home / Self Care | Payer: Self-pay | Source: Home / Self Care | Attending: Cardiovascular Disease

## 2020-12-26 ENCOUNTER — Encounter (HOSPITAL_COMMUNITY): Payer: Self-pay | Admitting: Cardiovascular Disease

## 2020-12-26 ENCOUNTER — Other Ambulatory Visit: Payer: Self-pay

## 2020-12-26 ENCOUNTER — Ambulatory Visit (HOSPITAL_COMMUNITY)
Admission: RE | Admit: 2020-12-26 | Discharge: 2020-12-26 | Disposition: A | Discharge status: Home / Self Care | Payer: 59 | Attending: Cardiovascular Disease | Admitting: Cardiovascular Disease

## 2020-12-26 DIAGNOSIS — Z87891 Personal history of nicotine dependence: Secondary | ICD-10-CM | POA: Diagnosis not present

## 2020-12-26 DIAGNOSIS — Z880 Allergy status to penicillin: Secondary | ICD-10-CM | POA: Insufficient documentation

## 2020-12-26 DIAGNOSIS — Z953 Presence of xenogenic heart valve: Secondary | ICD-10-CM | POA: Diagnosis not present

## 2020-12-26 DIAGNOSIS — I4819 Other persistent atrial fibrillation: Secondary | ICD-10-CM | POA: Diagnosis not present

## 2020-12-26 DIAGNOSIS — D649 Anemia, unspecified: Secondary | ICD-10-CM | POA: Diagnosis not present

## 2020-12-26 DIAGNOSIS — I5033 Acute on chronic diastolic (congestive) heart failure: Secondary | ICD-10-CM | POA: Insufficient documentation

## 2020-12-26 DIAGNOSIS — Z7901 Long term (current) use of anticoagulants: Secondary | ICD-10-CM | POA: Diagnosis not present

## 2020-12-26 DIAGNOSIS — I110 Hypertensive heart disease with heart failure: Secondary | ICD-10-CM | POA: Diagnosis not present

## 2020-12-26 DIAGNOSIS — Z79899 Other long term (current) drug therapy: Secondary | ICD-10-CM | POA: Diagnosis not present

## 2020-12-26 DIAGNOSIS — Z7984 Long term (current) use of oral hypoglycemic drugs: Secondary | ICD-10-CM | POA: Diagnosis not present

## 2020-12-26 DIAGNOSIS — G4733 Obstructive sleep apnea (adult) (pediatric): Secondary | ICD-10-CM | POA: Diagnosis not present

## 2020-12-26 DIAGNOSIS — Z6838 Body mass index (BMI) 38.0-38.9, adult: Secondary | ICD-10-CM | POA: Diagnosis not present

## 2020-12-26 DIAGNOSIS — I4891 Unspecified atrial fibrillation: Secondary | ICD-10-CM | POA: Diagnosis not present

## 2020-12-26 DIAGNOSIS — Z886 Allergy status to analgesic agent status: Secondary | ICD-10-CM | POA: Diagnosis not present

## 2020-12-26 HISTORY — PX: CARDIOVERSION: SHX1299

## 2020-12-26 LAB — POCT I-STAT, CHEM 8
BUN: 20 mg/dL (ref 6–20)
Calcium, Ion: 1.17 mmol/L (ref 1.15–1.40)
Chloride: 105 mmol/L (ref 98–111)
Creatinine, Ser: 1.1 mg/dL (ref 0.61–1.24)
Glucose, Bld: 101 mg/dL — ABNORMAL HIGH (ref 70–99)
HCT: 35 % — ABNORMAL LOW (ref 39.0–52.0)
Hemoglobin: 11.9 g/dL — ABNORMAL LOW (ref 13.0–17.0)
Potassium: 4.2 mmol/L (ref 3.5–5.1)
Sodium: 143 mmol/L (ref 135–145)
TCO2: 28 mmol/L (ref 22–32)

## 2020-12-26 SURGERY — CARDIOVERSION
Anesthesia: General

## 2020-12-26 MED ORDER — SODIUM CHLORIDE 0.9 % IV SOLN: INTRAVENOUS | Status: DC | PRN

## 2020-12-26 MED ORDER — LIDOCAINE 2% (20 MG/ML) 5 ML SYRINGE
INTRAMUSCULAR | Status: DC | PRN
  Administered 2020-12-26: 60 mg via INTRAVENOUS

## 2020-12-26 MED ORDER — PROPOFOL 10 MG/ML IV BOLUS
INTRAVENOUS | Status: DC | PRN
  Administered 2020-12-26: 100 mg via INTRAVENOUS

## 2020-12-26 NOTE — Interval H&P Note (Signed)
History and Physical Interval Note:  12/26/2020 12:14 PM  Philip [name]  has presented today for surgery, with the diagnosis of AFIB.  The various methods of treatment have been discussed with the patient and family. After consideration of risks, benefits and other options for treatment, the patient has consented to  Procedure(s): CARDIOVERSION (N/A) as a surgical intervention.  The patient's history has been reviewed, patient examined, no change in status, stable for surgery.  I have reviewed the patient's chart and labs.  Questions were answered to the patient's satisfaction.     Charlton Haws

## 2020-12-26 NOTE — Transfer of Care (Signed)
Immediate Anesthesia Transfer of Care Note  Patient: Philip [name]  Procedure(s) Performed: CARDIOVERSION  Patient Location: Endoscopy Unit  Anesthesia Type:General  Level of Consciousness: sedated and patient cooperative  Airway & Oxygen Therapy: Patient Spontanous Breathing and Patient connected to nasal cannula oxygen  Post-op Assessment: Report given to RN, Post -op Vital signs reviewed and stable and Patient moving all extremities  Post vital signs: Reviewed and stable  Last Vitals:  Vitals Value Taken Time  BP    Temp    Pulse    Resp    SpO2      Last Pain:  Vitals:   12/26/20 1013  TempSrc: Temporal  PainSc: 0-No pain         Complications: No notable events documented.

## 2020-12-26 NOTE — Anesthesia Preprocedure Evaluation (Addendum)
Anesthesia Evaluation  Patient identified by MRN, date of birth, ID band Patient awake    Reviewed: Allergy & Precautions, NPO status , Patient's Chart, lab work & pertinent test results  Airway Mallampati: II  TM Distance: >3 FB Neck ROM: Full    Dental no notable dental hx.    Pulmonary sleep apnea and Continuous Positive Airway Pressure Ventilation , former smoker,    Pulmonary exam normal breath sounds clear to auscultation       Cardiovascular hypertension, Pt. on medications and Pt. on home beta blockers + dysrhythmias Atrial Fibrillation  Rhythm:Irregular Rate:Normal     Neuro/Psych negative neurological ROS  negative psych ROS   GI/Hepatic Neg liver ROS, PUD, GERD  ,  Endo/Other  diabetes  Renal/GU negative Renal ROS     Musculoskeletal  (+) Arthritis , Gout   Abdominal (+) + obese,   Peds  Hematology HLD   Anesthesia Other Findings A-FIB  Reproductive/Obstetrics                            Anesthesia Physical Anesthesia Plan  ASA: 3  Anesthesia Plan: General   Post-op Pain Management:    Induction: Intravenous  PONV Risk Score and Plan: 2  Airway Management Planned: Mask  Additional Equipment:   Intra-op Plan:   Post-operative Plan:   Informed Consent: I have reviewed the patients History and Physical, chart, labs and discussed the procedure including the risks, benefits and alternatives for the proposed anesthesia with the patient or authorized representative who has indicated his/her understanding and acceptance.       Plan Discussed with: CRNA  Anesthesia Plan Comments:        Anesthesia Quick Evaluation

## 2020-12-26 NOTE — CV Procedure (Signed)
DCC: Anesthesia: Propofol On Rx DOAC no missed doses  DCC x 4 150J then 200J x 3 with manual compression   Failed to convert to NSR On low dose Tikosyn already will f/u with Afib clinic Dr Antoine Poche consider ablation   Charlton Haws MD [facility]

## 2020-12-27 ENCOUNTER — Encounter (HOSPITAL_COMMUNITY): Payer: Self-pay | Admitting: Cardiovascular Disease

## 2020-12-27 NOTE — Anesthesia Postprocedure Evaluation (Signed)
Anesthesia Post Note  Patient: Philip [name]  Procedure(s) Performed: CARDIOVERSION     Patient location during evaluation: Endoscopy Anesthesia Type: General Level of consciousness: awake Pain management: pain level controlled Vital Signs Assessment: post-procedure vital signs reviewed and stable Respiratory status: spontaneous breathing, nonlabored ventilation, respiratory function stable and patient connected to nasal cannula oxygen Cardiovascular status: blood pressure returned to baseline and stable Postop Assessment: no apparent nausea or vomiting Anesthetic complications: no   No notable events documented.  Last Vitals:  Vitals:   12/26/20 1107 12/26/20 1118  BP: 108/76 114/78  Pulse: 77 72  Resp: 15 14  Temp:    SpO2: 99% 98%    Last Pain:  Vitals:   12/26/20 1118  TempSrc:   PainSc: 0-No pain                 Anayla Giannetti P Grabiel Schmutz

## 2021-01-01 ENCOUNTER — Encounter (HOSPITAL_COMMUNITY): Payer: Self-pay | Admitting: Nurse Practitioner

## 2021-01-01 ENCOUNTER — Other Ambulatory Visit: Payer: Self-pay

## 2021-01-01 ENCOUNTER — Ambulatory Visit (HOSPITAL_COMMUNITY)
Admission: RE | Admit: 2021-01-01 | Discharge: 2021-01-01 | Disposition: A | Discharge status: Home / Self Care | Payer: 59 | Source: Ambulatory Visit | Attending: Nurse Practitioner | Admitting: Nurse Practitioner

## 2021-01-01 VITALS — BP 124/88 | HR 80 | Ht 76.0 in | Wt 308.6 lb

## 2021-01-01 DIAGNOSIS — G4733 Obstructive sleep apnea (adult) (pediatric): Secondary | ICD-10-CM | POA: Diagnosis not present

## 2021-01-01 DIAGNOSIS — Z953 Presence of xenogenic heart valve: Secondary | ICD-10-CM | POA: Diagnosis not present

## 2021-01-01 DIAGNOSIS — I4819 Other persistent atrial fibrillation: Secondary | ICD-10-CM | POA: Diagnosis present

## 2021-01-01 DIAGNOSIS — R609 Edema, unspecified: Secondary | ICD-10-CM | POA: Insufficient documentation

## 2021-01-01 DIAGNOSIS — Z7984 Long term (current) use of oral hypoglycemic drugs: Secondary | ICD-10-CM | POA: Diagnosis not present

## 2021-01-01 DIAGNOSIS — Z79899 Other long term (current) drug therapy: Secondary | ICD-10-CM | POA: Diagnosis not present

## 2021-01-01 DIAGNOSIS — Z880 Allergy status to penicillin: Secondary | ICD-10-CM | POA: Diagnosis not present

## 2021-01-01 DIAGNOSIS — Z96652 Presence of left artificial knee joint: Secondary | ICD-10-CM | POA: Insufficient documentation

## 2021-01-01 DIAGNOSIS — Z9989 Dependence on other enabling machines and devices: Secondary | ICD-10-CM | POA: Diagnosis not present

## 2021-01-01 DIAGNOSIS — Z7901 Long term (current) use of anticoagulants: Secondary | ICD-10-CM | POA: Insufficient documentation

## 2021-01-01 DIAGNOSIS — Z9884 Bariatric surgery status: Secondary | ICD-10-CM | POA: Diagnosis not present

## 2021-01-01 DIAGNOSIS — I110 Hypertensive heart disease with heart failure: Secondary | ICD-10-CM | POA: Diagnosis not present

## 2021-01-01 DIAGNOSIS — Z6837 Body mass index (BMI) 37.0-37.9, adult: Secondary | ICD-10-CM | POA: Diagnosis not present

## 2021-01-01 DIAGNOSIS — Z87891 Personal history of nicotine dependence: Secondary | ICD-10-CM | POA: Insufficient documentation

## 2021-01-01 DIAGNOSIS — Z886 Allergy status to analgesic agent status: Secondary | ICD-10-CM | POA: Insufficient documentation

## 2021-01-01 DIAGNOSIS — D6869 Other thrombophilia: Secondary | ICD-10-CM | POA: Diagnosis not present

## 2021-01-01 DIAGNOSIS — D500 Iron deficiency anemia secondary to blood loss (chronic): Secondary | ICD-10-CM | POA: Diagnosis not present

## 2021-01-01 DIAGNOSIS — I5033 Acute on chronic diastolic (congestive) heart failure: Secondary | ICD-10-CM | POA: Diagnosis not present

## 2021-01-01 DIAGNOSIS — Z881 Allergy status to other antibiotic agents status: Secondary | ICD-10-CM | POA: Diagnosis not present

## 2021-01-01 NOTE — Progress Notes (Signed)
Primary Care Physician: [name], MD Primary Cardiologist: Antoine Poche Primary Electrophysiologist: Celso Amy is a 60 y.o. male with a history of persistent atrial fibrillation who presents for follow up in the [facility].  Since discharge for Tikosyn load in June 2019, the patient reports doing very well.  His energy level has improved.   He has not noted any afib. He remains on tikosyn and xareltofor a CHA2DS2VASc score of 3.  He reports going on a keto diet and losing 35 lbs in 2 weeks. He has now leveled off. SInce losing weight he has had significant improvement in his ankle edema.He is chronically anemic sine GI bleed in 2018. His PCP is planning s stool card to check for occult bleed.   F/u in afib clinic, 08/12/20 for Tikosyn surveillance. He is feeling well. Reports no afib. He feels well. Labs were checked one month ago, K+ stable but mag was low for Tikosyn at 1.7. Will recheck today. He is using CPAP.   F/u in afib clinic, 12/17/20. He asked to be seen as he had noted increase of fluid retention and return of afib around 2 weeks ago. He is being complaint with tikosyn. He is now living with his father in [place] but this is not a permanent change. He did have an ER visit  in [place], visiting  his girlfriend and took a Delta 8, marijuana gummy, first time use,  and became very sedated to the point he was not answering questions, so EMS was called.  CAN view visit in Epic as well as labs on 8/26. He was given IV fluids and became more responsive and d/c home. He was told he was in afib then. He took metolazone x one last week but did not lose that much fluid. He feels that 300 lbs is his dry weight. According to his last weight here not much change in weight. States no missed anticoagulin for at least 3 weeks.   F/u in afib clinic, 915/22. Unfortunately, he did not shock out of afib. He is rate controlled. He is symptomatic with afib with  fatigue. He has lost some weight since I saw him last  time. He has started on keto diet. He is pending an echo 10/3. I will refer back to [name] as he has failed Tikosyn to see if ablation candidate. He will continue tikosyn for now.   Today, he  denies symptoms of palpitations, chest pain, shortness of breath, orthopnea, PND, lower extremity edema, dizziness, presyncope, syncope, snoring, daytime somnolence, bleeding, or neurologic sequela. The patient is tolerating medications without difficulties and is otherwise without complaint today.    Atrial Fibrillation Risk Factors:  he does have symptoms or diagnosis of sleep apnea. he is compliant with CPAP therapy.  he does not have a history of rheumatic fever.  he has a BMI of Body mass index is 37.56 kg/m.Marland Kitchen Filed Weights   01/01/21 1430  Weight: (!) 140 kg    LA size: 46   Atrial Fibrillation Management history:  Previous antiarrhythmic drugs: Flecainide -> Tikosyn  Previous cardioversions: 07/2017, 08/2017  Previous ablations: none  CHADS2VASC score: 3  Anticoagulation history: Xarelto   Past Medical History:  Diagnosis Date   Arthritis    Dental crowns present    Diabetes mellitus    NIDDM   Diabetic foot ulcer (HCC) 08/2011   right foot   Fatty liver    Gastric ulcer  no current problems   GERD (gastroesophageal reflux disease)    Gout    H/O mitral valve repair 11/2003   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   Hx of bacterial endocarditis 11/2003   Hypertension    Neuropathy    feet bilat    Obesity    Persistent atrial fibrillation (HCC)    Sleep apnea    uses CPAP nightly   Toe deformity 08/2011   right claw hallux   Past Surgical History:  Procedure Laterality Date   adenoid surgery      AORTIC VALVE REPLACEMENT  11/2003   CARDIOVERSION N/A 07/25/2017   Procedure: CARDIOVERSION;  Surgeon: Chilton Si, MD;  Location: [facility] ENDOSCOPY;   Service: Cardiovascular;  Laterality: N/A;   CARDIOVERSION N/A 09/07/2017   Procedure: CARDIOVERSION;  Surgeon: Chrystie Nose, MD;  Location: [facility] ENDOSCOPY;  Service: Cardiovascular;  Laterality: N/A;   CARDIOVERSION N/A 09/29/2017   Procedure: CARDIOVERSION;  Surgeon: Thurmon Fair, MD;  Location: [facility] ENDOSCOPY;  Service: Cardiovascular;  Laterality: N/A;   CARDIOVERSION N/A 12/26/2020   Procedure: CARDIOVERSION;  Surgeon: Wendall Stade, MD;  Location: [facility] ENDOSCOPY;  Service: Cardiovascular;  Laterality: N/A;   COLONOSCOPY WITH PROPOFOL N/A 03/30/2017   Procedure: COLONOSCOPY WITH PROPOFOL;  Surgeon: Benancio Deeds, MD;  Location: [facility] ENDOSCOPY;  Service: Gastroenterology;  Laterality: N/A;   ESOPHAGOGASTRODUODENOSCOPY (EGD) WITH PROPOFOL N/A 03/29/2017   Procedure: ESOPHAGOGASTRODUODENOSCOPY (EGD) WITH PROPOFOL;  Surgeon: Benancio Deeds, MD;  Location: [facility] ENDOSCOPY;  Service: Gastroenterology;  Laterality: N/A;   GASTRIC BYPASS  03/2004   GIVENS CAPSULE STUDY N/A 03/30/2017   Procedure: GIVENS CAPSULE STUDY;  Surgeon: Benancio Deeds, MD;  Location: [facility] ENDOSCOPY;  Service: Gastroenterology;  Laterality: N/A;   KNEE ARTHROSCOPY  08/09/2007 - left   07/15/2003 - right   METATARSAL OSTEOTOMY  03/20/2010   right 1st MT; gastroc soleus recession   NASAL SINUS SURGERY     TENDON RELEASE  09/09/2011   Procedure: HEEL CORD LENGTHENING;  Surgeon: Toni Arthurs, MD;  Location: [facility];  Service: Orthopedics;  Laterality: Right;  Righ tachilles tendon lengthening   TOE FUSION  07/12/2005   left 3rd toe PIP and DIP fusion   TONSILLECTOMY AND ADENOIDECTOMY     TOTAL KNEE ARTHROPLASTY Left 03/18/2015   Procedure: LEFT TOTAL KNEE ARTHROPLASTY;  Surgeon: Durene Romans, MD;  Location: [facility] ORS;  Service: Orthopedics;  Laterality: Left;    Current Outpatient Medications  Medication Sig Dispense Refill   Accu-Chek FastClix Lancets MISC 2 (two) times daily. as directed      ACCU-CHEK GUIDE test strip 2 (two) times daily.     Ascorbic Acid (VITAMIN C) 1000 MG tablet Take 1,000 mg by mouth in the morning.     buPROPion (WELLBUTRIN XL) 150 MG 24 hr tablet Take 150 mg by mouth every morning.     Calcium Carb-Cholecalciferol (CALCIUM 600+D3 PO) Take 1 tablet by mouth in the morning and at bedtime.     cetirizine (ZYRTEC) 10 MG tablet Take 10 mg by mouth daily.      clindamycin (CLEOCIN) 150 MG capsule Take 4 tablets by mouth 1 hour prior to seeing the dentist     dofetilide (TIKOSYN) 250 MCG capsule Take 1 capsule (250 mcg total) by mouth 2 (two) times daily. 180 capsule 1   esomeprazole (NEXIUM) 40 MG capsule Take 40 mg by mouth every evening.      ferrous  sulfate 325 (65 FE) MG tablet Take 325 mg by mouth every evening.     furosemide (LASIX) 40 MG tablet Take 1 tablet (40 mg total) by mouth daily. 30 tablet 6   KLOR-CON M20 20 MEQ tablet TAKE 1 TABLET DAILY, AND 1 EXTRA TABLET ON DAYS       TAKING  METOLAZONE. 95 tablet 2   losartan (COZAAR) 25 MG tablet Take 25 mg by mouth every evening.      metolazone (ZAROXOLYN) 5 MG tablet Take 5 mg by mouth daily as needed (fluid retention (average once weekly)).     metoprolol succinate (TOPROL-XL) 50 MG 24 hr tablet Take 1 tablet (50 mg total) by mouth daily. Take with or immediately following a meal. 90 tablet 3   nystatin-triamcinolone (MYCOLOG II) cream Apply 1 application topically 3 (three) times daily as needed (yeast/irritation).     simvastatin (ZOCOR) 10 MG tablet Take 10 mg by mouth every evening.      sitaGLIPtin-metformin (JANUMET) 50-1000 MG tablet Take 1 tablet by mouth 2 (two) times daily with a meal.      XARELTO 20 MG TABS tablet TAKE 1 TABLET DAILY WITH   SUPPER 90 tablet 3   No current facility-administered medications for this encounter.    Allergies  Allergen Reactions   Nsaids Swelling and Other (See Comments)   Penicillins Hives, Swelling and Other (See Comments)    NO REACTION LISTED Has patient  had a PCN reaction causing immediate rash, facial/tongue/throat swelling, SOB or lightheadedness with hypotension: Yes Has patient had a PCN reaction causing severe rash involving mucus membranes or skin necrosis: No Has patient had a PCN reaction that required hospitalization No Has patient had a PCN reaction occurring within the last 10 years: No If all of the above answers are "NO", then may proceed with Cephalosporin use.  Has taken Keflex w/o issue       Other Other (See Comments)    CYCLOOXYGENASE INHIBITORS-NO REACTION LISTED.    Social History   Socioeconomic History   Marital status: Widowed    Spouse name: Not on file   Number of children: Not on file   Years of education: Not on file   Highest education level: Not on file  Occupational History   Not on file  Tobacco Use   Smoking status: Former    Packs/day: 1.00    Years: 2.00    Pack years: 2.00    Types: Cigars, Pipe, Cigarettes    Quit date: 01/31/2011    Years since quitting: 9.9   Smokeless tobacco: Never   Tobacco comments:    Former smoker (01/01/2021)  Vaping Use   Vaping Use: Never used  Substance and Sexual Activity   Alcohol use: Not Currently    Alcohol/week: 0.0 standard drinks   Drug use: No   Sexual activity: Not on file  Other Topics Concern   Not on file  Social History Narrative   Not on file   Social Determinants of Health   Financial Resource Strain: Not on file  Food Insecurity: Not on file  Transportation Needs: Not on file  Physical Activity: Not on file  Stress: Not on file  Social Connections: Not on file  Intimate Partner Violence: Not on file    Family History  Problem Relation Age of Onset   Alzheimer's disease Mother    Diabetes Father    Colon cancer Unknown        family history    ROS-  All systems are reviewed and negative except as per the HPI above.  Physical Exam: Vitals:   01/01/21 1430  BP: 124/88  Pulse: 80  Weight: (!) 140 kg  Height: 6\' 4"   (1.93 m)    GEN- The patient is well appearing, alert and oriented x 3 today.   Head- normocephalic, atraumatic Eyes-  Sclera clear, conjunctiva pink Ears- hearing intact Oropharynx- clear Neck- supple  Lungs- Clear to ausculation bilaterally, normal work of breathing Heart- Regular rate and rhythm  GI- soft, NT, ND, + BS Extremities- no clubbing, cyanosis, or edema MS- no significant deformity or atrophy Skin- no rash or lesion Psych- euthymic mood, full affect Neuro- strength and sensation are intact  Wt Readings from Last 3 Encounters:  01/01/21 (!) 140 kg  12/17/20 (!) 143.9 kg  08/13/20 (!) 144.4 kg    EKG  today atrial fibrillation at 80 bpm    Epic records are reviewed at length today  Assessment and Plan:  1. Persistent atrial fibrillation DId well maintaining SR on dofetilide 250 mcg bid until 2 weeks ago when he went into afib  Cardioversion x 4 failed to convert pt Rate controlled and symptomatic with fatigue  Echo pending 10/3 Continue Xarelto for CHADS2VASC of 3 Continue Tikosyn for now as well as metoprolol 50 mg daily   2.  Morbid obesity Body mass index is 37.56 kg/m. He does exercise on a regular basis He has started Keto diet and has lost 6 lbs since I saw him last  Encouraged to continue effort  3.  OSA Compliant with CPAP  4.  Acute on chronic diastolic heart failure  Continue  with current diuretics   5.  S/p bioprosthetic AVR  per Dr. 08/15/20  Prior endocarditis Echo pending  10/3  6. Chronic anemia  Stable   I will request appointment with Dr. Antoine Poche on same day as echo as pt travels up from  [place], [place], to be considered for ablation   [facility] [address] [place], [place] [phone]

## 2021-01-19 ENCOUNTER — Encounter: Payer: Self-pay | Admitting: Internal Medicine

## 2021-01-19 ENCOUNTER — Ambulatory Visit (HOSPITAL_COMMUNITY): Payer: 59 | Attending: Cardiovascular Disease

## 2021-01-19 ENCOUNTER — Other Ambulatory Visit: Payer: Self-pay

## 2021-01-19 ENCOUNTER — Encounter: Payer: Self-pay | Admitting: *Deleted

## 2021-01-19 ENCOUNTER — Ambulatory Visit: Payer: 59 | Admitting: Internal Medicine

## 2021-01-19 VITALS — BP 110/80 | HR 86 | Ht 76.0 in | Wt 304.2 lb

## 2021-01-19 DIAGNOSIS — I10 Essential (primary) hypertension: Secondary | ICD-10-CM

## 2021-01-19 DIAGNOSIS — I4819 Other persistent atrial fibrillation: Secondary | ICD-10-CM

## 2021-01-19 DIAGNOSIS — R0602 Shortness of breath: Secondary | ICD-10-CM

## 2021-01-19 DIAGNOSIS — Z952 Presence of prosthetic heart valve: Secondary | ICD-10-CM

## 2021-01-19 DIAGNOSIS — I340 Nonrheumatic mitral (valve) insufficiency: Secondary | ICD-10-CM

## 2021-01-19 LAB — ECHOCARDIOGRAM COMPLETE
AR max vel: 2.7 cm2
AV Area VTI: 3.09 cm2
AV Area mean vel: 2.7 cm2
AV Mean grad: 4.7 mmHg
AV Peak grad: 8.6 mmHg
Ao pk vel: 1.47 m/s
S' Lateral: 3.2 cm

## 2021-01-19 MED ORDER — PERFLUTREN LIPID MICROSPHERE
1.0000 mL | INTRAVENOUS | Status: AC | PRN
  Administered 2021-01-19: 3 mL via INTRAVENOUS

## 2021-01-19 NOTE — Progress Notes (Signed)
PCP: [name], MD Primary Cardiologist: Dr Antoine Poche Primary EP: Dr Graylon Gunning Philip [name] is a 60 y.o. male who presents today for routine electrophysiology followup.  Since last being seen in our clinic, the patient reports doing reasonably well.  He continues to struggle with symptomatic afib.  Recent cardioversion was not successful.  + SOB and fatigue.  Today, he denies symptoms of palpitations, chest pain,  lower extremity edema, dizziness, presyncope, or syncope.  The patient is otherwise without complaint today.   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   Hx of bacterial endocarditis 11/2003   Hypertension    Neuropathy    feet bilat    Obesity    Persistent atrial fibrillation (HCC)    Sleep apnea    uses CPAP nightly   Toe deformity 08/2011   right claw hallux   Past Surgical History:  Procedure Laterality Date   adenoid surgery      AORTIC VALVE REPLACEMENT  11/2003   CARDIOVERSION N/A 07/25/2017   Procedure: CARDIOVERSION;  Surgeon: Chilton Si, MD;  Location: [facility] ENDOSCOPY;  Service: Cardiovascular;  Laterality: N/A;   CARDIOVERSION N/A 09/07/2017   Procedure: CARDIOVERSION;  Surgeon: Chrystie Nose, MD;  Location: [facility] ENDOSCOPY;  Service: Cardiovascular;  Laterality: N/A;   CARDIOVERSION N/A 09/29/2017   Procedure: CARDIOVERSION;  Surgeon: Thurmon Fair, MD;  Location: [facility] ENDOSCOPY;  Service: Cardiovascular;  Laterality: N/A;   CARDIOVERSION N/A 12/26/2020   Procedure: CARDIOVERSION;  Surgeon: Wendall Stade, MD;  Location: [facility] ENDOSCOPY;  Service: Cardiovascular;  Laterality: N/A;   COLONOSCOPY WITH PROPOFOL  N/A 03/30/2017   Procedure: COLONOSCOPY WITH PROPOFOL;  Surgeon: Benancio Deeds, MD;  Location: [facility] ENDOSCOPY;  Service: Gastroenterology;  Laterality: N/A;   ESOPHAGOGASTRODUODENOSCOPY (EGD) WITH PROPOFOL N/A 03/29/2017   Procedure: ESOPHAGOGASTRODUODENOSCOPY (EGD) WITH PROPOFOL;  Surgeon: Benancio Deeds, MD;  Location: [facility] ENDOSCOPY;  Service: Gastroenterology;  Laterality: N/A;   GASTRIC BYPASS  03/2004   GIVENS CAPSULE STUDY N/A 03/30/2017   Procedure: GIVENS CAPSULE STUDY;  Surgeon: Benancio Deeds, MD;  Location: [facility] ENDOSCOPY;  Service: Gastroenterology;  Laterality: N/A;   KNEE ARTHROSCOPY  08/09/2007 - left   07/15/2003 - right   METATARSAL OSTEOTOMY  03/20/2010   right 1st MT; gastroc soleus recession   NASAL SINUS SURGERY     TENDON RELEASE  09/09/2011   Procedure: HEEL CORD LENGTHENING;  Surgeon: Toni Arthurs, MD;  Location: [facility];  Service: Orthopedics;  Laterality: Right;  Righ tachilles tendon lengthening   TOE FUSION  07/12/2005   left 3rd toe PIP and DIP fusion   TONSILLECTOMY AND ADENOIDECTOMY     TOTAL KNEE ARTHROPLASTY Left 03/18/2015   Procedure: LEFT TOTAL KNEE ARTHROPLASTY;  Surgeon: Durene Romans, MD;  Location: [facility];  Service: Orthopedics;  Laterality: Left;    ROS- all systems are reviewed and negatives except as per HPI above  Current Outpatient Medications  Medication Sig Dispense Refill   Accu-Chek FastClix Lancets MISC 2 (two) times daily. as directed  ACCU-CHEK GUIDE test strip 2 (two) times daily.     Ascorbic Acid (VITAMIN C) 1000 MG tablet Take 1,000 mg by mouth in the morning.     buPROPion (WELLBUTRIN XL) 150 MG 24 hr tablet Take 150 mg by mouth every morning.     Calcium Carb-Cholecalciferol (CALCIUM 600+D3 PO) Take 1 tablet by mouth in the morning and at bedtime.     cetirizine (ZYRTEC) 10 MG tablet Take 10 mg by mouth daily.      clindamycin (CLEOCIN) 150 MG capsule Take 4 tablets by mouth 1 hour prior to seeing the  dentist     dofetilide (TIKOSYN) 250 MCG capsule Take 1 capsule (250 mcg total) by mouth 2 (two) times daily. 180 capsule 1   esomeprazole (NEXIUM) 40 MG capsule Take 40 mg by mouth every evening.      ferrous sulfate 325 (65 FE) MG tablet Take 325 mg by mouth every evening.     furosemide (LASIX) 40 MG tablet Take 1 tablet (40 mg total) by mouth daily. 30 tablet 6   KLOR-CON M20 20 MEQ tablet TAKE 1 TABLET DAILY, AND 1 EXTRA TABLET ON DAYS       TAKING  METOLAZONE. 95 tablet 2   losartan (COZAAR) 25 MG tablet Take 25 mg by mouth every evening.      metolazone (ZAROXOLYN) 5 MG tablet Take 5 mg by mouth daily as needed (fluid retention (average once weekly)).     metoprolol succinate (TOPROL-XL) 50 MG 24 hr tablet Take 1 tablet (50 mg total) by mouth daily. Take with or immediately following a meal. 90 tablet 3   nystatin-triamcinolone (MYCOLOG II) cream Apply 1 application topically 3 (three) times daily as needed (yeast/irritation).     simvastatin (ZOCOR) 10 MG tablet Take 10 mg by mouth every evening.      sitaGLIPtin-metformin (JANUMET) 50-1000 MG tablet Take 1 tablet by mouth 2 (two) times daily with a meal.      XARELTO 20 MG TABS tablet TAKE 1 TABLET DAILY WITH   SUPPER 90 tablet 3   No current facility-administered medications for this visit.   Facility-Administered Medications Ordered in Other Visits  Medication Dose Route Frequency Provider Last Rate Last Admin   perflutren lipid microspheres (DEFINITY) IV suspension  1-10 mL Intravenous PRN Rollene Rotunda, MD   3 mL at 01/19/21 0853    Physical Exam: Vitals:   01/19/21 0918  BP: 110/80  Pulse: 86  SpO2: 100%  Weight: (!) 304 lb 3.2 oz (138 kg)  Height: 6\' 4"  (1.93 m)    GEN- The patient is well appearing, alert and oriented x 3 today.   Head- normocephalic, atraumatic Eyes-  Sclera clear, conjunctiva pink Ears- hearing intact Oropharynx- clear Lungs- Clear to ausculation bilaterally, normal work of breathing Heart-  iRRR GI- soft, NT, ND, + BS Extremities- no clubbing, cyanosis, or edema  Wt Readings from Last 3 Encounters:  01/19/21 (!) 304 lb 3.2 oz (138 kg)  01/01/21 (!) 308 lb 9.6 oz (140 kg)  12/17/20 (!) 317 lb 3.2 oz (143.9 kg)   Echo today reveals EF 60%, severe LA enlargement (5.5 cm)  EKG tracing ordered today is personally reviewed and shows afib  Assessment and Plan:  Persistent afib The patient has symptomatic, recurrent atrial fibrillation. he has failed medical therapy with tikosyn. Chads2vasc score is 3.  he is anticoagulated with xarelto . Therapeutic strategies for afib including medicine and ablation were discussed in detail with the patient today. Risk,  benefits, and alternatives to EP study and radiofrequency ablation for afib were also discussed in detail today. These risks include but are not limited to stroke, bleeding, vascular damage, tamponade, perforation, damage to the esophagus, lungs, and other structures, pulmonary vein stenosis, worsening renal function, and death. The patient understands these risk and wishes to proceed.  We will therefore proceed with catheter ablation at the next available time.  Carto, ICE, anesthesia are requested for the procedure.  Will also obtain cardiac CT prior to the procedure to exclude LAA thrombus and further evaluate atrial anatomy.  2. Obesity Body mass index is 37.03 kg/m. Lifestyle modification advised  3. HTN Stable No change required today  4. Valvular heart disease Stable s/p bioprosthetic AVR No change required today  5. OSA Uses CPAP  Risks, benefits and potential toxicities for medications prescribed and/or refilled reviewed with patient today.   [name] MD, [facility] 01/19/2021 9:46 AM

## 2021-01-19 NOTE — Patient Instructions (Addendum)
Medication Instructions:  Your physician recommends that you continue on your current medications as directed. Please refer to the Current Medication list given to you today. *If you need a refill on your cardiac medications before your next appointment, please call your pharmacy*  Lab Work: None. If you have labs (blood work) drawn today and your tests are completely normal, you will receive your results only by: MyChart Message (if you have MyChart) OR A paper copy in the mail If you have any lab test that is abnormal or we need to change your treatment, we will call you to review the results.  Testing/Procedures: Your physician has requested that you have cardiac CT. Cardiac computed tomography (CT) is a painless test that uses an x-ray machine to take clear, detailed pictures of your heart. For further information please visit www.cardiosmart.org. Please follow instruction sheet as given.   Your physician has recommended that you have an ablation. Catheter ablation is a medical procedure used to treat some cardiac arrhythmias (irregular heartbeats). During catheter ablation, a long, thin, flexible tube is put into a blood vessel in your groin (upper thigh), or neck. This tube is called an ablation catheter. It is then guided to your heart through the blood vessel. Radio frequency waves destroy small areas of heart tissue where abnormal heartbeats may cause an arrhythmia to start. Please see the instruction sheet given to you today.   Follow-Up: At [facility], you and your health needs are our priority.  As part of our continuing mission to provide you with exceptional heart care, we have created designated Provider Care Teams.  These Care Teams include your primary Cardiologist (physician) and Advanced Practice Providers (APPs -  Physician Assistants and Nurse Practitioners) who all work together to provide you with the care you need, when you need it.  We recommend signing up for the  patient portal called "MyChart".  Sign up information is provided on this After Visit Summary.  MyChart is used to connect with patients for Virtual Visits (Telemedicine).  Patients are able to view lab/test results, encounter notes, upcoming appointments, etc.  Non-urgent messages can be sent to your provider as well.   To learn more about what you can do with MyChart, go to https://www.mychart.com.    Any Other Special Instructions Will Be Listed Below (If Applicable).  Cardiac Ablation Cardiac ablation is a procedure to destroy (ablate) some heart tissue that is sending bad signals. These bad signals cause problems in heart rhythm. The heart has many areas that make these signals. If there are problems in these areas, they can make the heart beat in a way that is not normal. Destroying some tissues can help make the heart rhythm normal. Tell your doctor about: Any allergies you have. All medicines you are taking. These include vitamins, herbs, eye drops, creams, and over-the-counter medicines. Any problems you or family members have had with medicines that make you fall asleep (anesthetics). Any blood disorders you have. Any surgeries you have had. Any medical conditions you have, such as kidney failure. Whether you are pregnant or may be pregnant. What are the risks? This is a safe procedure. But problems may occur, including: Infection. Bruising and bleeding. Bleeding into the chest. Stroke or blood clots. Damage to nearby areas of your body. Allergies to medicines or dyes. The need for a pacemaker if the normal system is damaged. Failure of the procedure to treat the problem. What happens before the procedure? Medicines Ask your doctor about: Changing or   stopping your normal medicines. This is important. Taking aspirin and ibuprofen. Do not take these medicines unless your doctor tells you to take them. Taking other medicines, vitamins, herbs, and supplements. General  instructions Follow instructions from your doctor about what you cannot eat or drink. Plan to have someone take you home from the hospital or clinic. If you will be going home right after the procedure, plan to have someone with you for 24 hours. Ask your doctor what steps will be taken to prevent infection. What happens during the procedure?  An IV tube will be put into one of your veins. You will be given a medicine to help you relax. The skin on your neck or groin will be numbed. A cut (incision) will be made in your neck or groin. A needle will be put through your cut and into a large vein. A tube (catheter) will be put into the needle. The tube will be moved to your heart. Dye may be put through the tube. This helps your doctor see your heart. Small devices (electrodes) on the tube will send out signals. A type of energy will be used to destroy some heart tissue. The tube will be taken out. Pressure will be held on your cut. This helps stop bleeding. A bandage will be put over your cut. The exact procedure may vary among doctors and hospitals. What happens after the procedure? You will be watched until you leave the hospital or clinic. This includes checking your heart rate, breathing rate, oxygen, and blood pressure. Your cut will be watched for bleeding. You will need to lie still for a few hours. Do not drive for 24 hours or as long as your doctor tells you. Summary Cardiac ablation is a procedure to destroy some heart tissue. This is done to treat heart rhythm problems. Tell your doctor about any medical conditions you may have. Tell him or her about all medicines you are taking to treat them. This is a safe procedure. But problems may occur. These include infection, bruising, bleeding, and damage to nearby areas of your body. Follow what your doctor tells you about food and drink. You may also be told to change or stop some of your medicines. After the procedure, do not drive  for 24 hours or as long as your doctor tells you. This information is not intended to replace advice given to you by your health care provider. Make sure you discuss any questions you have with your health care provider. Document Revised: 03/08/2019 Document Reviewed: 03/08/2019 Elsevier Patient Education  2022 Elsevier Inc.         

## 2021-01-21 ENCOUNTER — Other Ambulatory Visit: Payer: Self-pay | Admitting: Cardiology

## 2021-02-08 NOTE — Progress Notes (Signed)
Cardiology Office Note   Date:  02/09/2021   ID:  Philip [name], DOB [birth year], MRN [id number]  PCP:  [name], MD  Cardiologist:   Rollene Rotunda, MD   Chief Complaint  Patient presents with   Atrial Fibrillation       History of Present Illness: Philip [name] is a 60 y.o. male who presents for follow up of AVR and atrial fib. The patient had a bioprosthetic aortic valve replacement in 2005.  He had a follow up echo in Sept of 2018.    This showed a NL bioprosthesis.  He has had anemia and GI bleeding.  He had questionable AVMs seen on capsule endoscopy.  He has had DCCV and treatment and with Tikosyn.  He has been followed in the Atrial Fibrillation Clinic.  He has had recurrent fib and is going to have atrial fib ablation.    Since I last saw him he had an echo.  The aortic valve replacement is stable and the mitral valve has no significant regurgitation.  He says he feels less energetic when he is in fibrillation looks like he has been chronically on it since September.  He does not have any presyncope or syncope.  He does not have any new shortness of breath, PND or orthopnea.  He does do some regular exercising.  He occasionally gets some increased fluid and he takes Zaroxolyn rarely on top of his diuretic.  He watches his weight carefully.  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   Hx of bacterial endocarditis 11/2003   Hypertension    Neuropathy    feet bilat    Obesity    Persistent atrial fibrillation (HCC)    Sleep apnea    uses CPAP nightly   Toe deformity 08/2011   right claw hallux    Past Surgical  History:  Procedure Laterality Date   adenoid surgery      AORTIC VALVE REPLACEMENT  11/2003   CARDIOVERSION N/A 07/25/2017   Procedure: CARDIOVERSION;  Surgeon: Chilton Si, MD;  Location: [facility] ENDOSCOPY;  Service: Cardiovascular;  Laterality: N/A;   CARDIOVERSION N/A 09/07/2017   Procedure: CARDIOVERSION;  Surgeon: Chrystie Nose, MD;  Location: [facility] ENDOSCOPY;  Service: Cardiovascular;  Laterality: N/A;   CARDIOVERSION N/A 09/29/2017   Procedure: CARDIOVERSION;  Surgeon: Thurmon Fair, MD;  Location: [facility] ENDOSCOPY;  Service: Cardiovascular;  Laterality: N/A;   CARDIOVERSION N/A 12/26/2020   Procedure: CARDIOVERSION;  Surgeon: Wendall Stade, MD;  Location: [facility] ENDOSCOPY;  Service: Cardiovascular;  Laterality: N/A;   COLONOSCOPY WITH PROPOFOL N/A 03/30/2017   Procedure: COLONOSCOPY WITH PROPOFOL;  Surgeon: Benancio Deeds, MD;  Location: [facility] ENDOSCOPY;  Service: Gastroenterology;  Laterality: N/A;   ESOPHAGOGASTRODUODENOSCOPY (EGD) WITH PROPOFOL N/A 03/29/2017   Procedure: ESOPHAGOGASTRODUODENOSCOPY (EGD) WITH PROPOFOL;  Surgeon: Benancio Deeds, MD;  Location: [facility] ENDOSCOPY;  Service: Gastroenterology;  Laterality: N/A;   GASTRIC BYPASS  03/2004   GIVENS CAPSULE STUDY N/A 03/30/2017   Procedure: GIVENS CAPSULE STUDY;  Surgeon: Benancio Deeds,  MD;  Location: [facility] ENDOSCOPY;  Service: Gastroenterology;  Laterality: N/A;   KNEE ARTHROSCOPY  08/09/2007 - left   07/15/2003 - right   METATARSAL OSTEOTOMY  03/20/2010   right 1st MT; gastroc soleus recession   NASAL SINUS SURGERY     TENDON RELEASE  09/09/2011   Procedure: HEEL CORD LENGTHENING;  Surgeon: Toni Arthurs, MD;  Location: [facility];  Service: Orthopedics;  Laterality: Right;  Righ tachilles tendon lengthening   TOE FUSION  07/12/2005   left 3rd toe PIP and DIP fusion   TONSILLECTOMY AND ADENOIDECTOMY     TOTAL KNEE ARTHROPLASTY Left 03/18/2015   Procedure: LEFT TOTAL KNEE ARTHROPLASTY;  Surgeon: Durene Romans,  MD;  Location: [facility] ORS;  Service: Orthopedics;  Laterality: Left;     Current Outpatient Medications  Medication Sig Dispense Refill   Accu-Chek FastClix Lancets MISC 2 (two) times daily. as directed     ACCU-CHEK GUIDE test strip 2 (two) times daily.     Ascorbic Acid (VITAMIN C) 1000 MG tablet Take 1,000 mg by mouth in the morning.     buPROPion (WELLBUTRIN XL) 150 MG 24 hr tablet Take 150 mg by mouth every morning.     Calcium Carb-Cholecalciferol (CALCIUM 600+D3 PO) Take 1 tablet by mouth in the morning and at bedtime.     cetirizine (ZYRTEC) 10 MG tablet Take 10 mg by mouth daily.      clindamycin (CLEOCIN) 150 MG capsule Take 4 tablets by mouth 1 hour prior to seeing the dentist     dofetilide (TIKOSYN) 250 MCG capsule Take 1 capsule (250 mcg total) by mouth 2 (two) times daily. 180 capsule 1   esomeprazole (NEXIUM) 40 MG capsule Take 40 mg by mouth every evening.      ferrous sulfate 325 (65 FE) MG tablet Take 325 mg by mouth every evening.     furosemide (LASIX) 40 MG tablet Take 1 tablet (40 mg total) by mouth daily. 30 tablet 6   KLOR-CON M20 20 MEQ tablet TAKE 1 TABLET DAILY, AND 1 EXTRA TABLET ON DAYS       TAKING  METOLAZONE. 95 tablet 2   losartan (COZAAR) 25 MG tablet Take 25 mg by mouth every evening.      metolazone (ZAROXOLYN) 5 MG tablet Take 5 mg by mouth daily as needed (fluid retention (average once weekly)).     metoprolol succinate (TOPROL-XL) 50 MG 24 hr tablet TAKE 1 TABLET DAILY, WITH  OR IMMEDIATELY FOLLOWING A MEAL 90 tablet 3   nystatin-triamcinolone (MYCOLOG II) cream Apply 1 application topically 3 (three) times daily as needed (yeast/irritation).     simvastatin (ZOCOR) 10 MG tablet Take 10 mg by mouth every evening.      sitaGLIPtin-metformin (JANUMET) 50-1000 MG tablet Take 1 tablet by mouth 2 (two) times daily with a meal.      XARELTO 20 MG TABS tablet TAKE 1 TABLET DAILY WITH   SUPPER 90 tablet 3   No current facility-administered medications for this  visit.    Allergies:   Nsaids, Penicillins, and Other    ROS:  Please see the history of present illness.   Otherwise, review of systems are positive for knee pain.   All other systems are reviewed and negative.    PHYSICAL EXAM: VS:  BP 118/70   Pulse 60   Ht 6\' 4"  (1.93 m)   Wt (!) 309 lb (140.2 kg)   SpO2 97%   BMI 37.61 kg/m  , BMI  Body mass index is 37.61 kg/m. GENERAL:  Well appearing NECK:  No jugular venous distention, waveform within normal limits, carotid upstroke brisk and symmetric, no bruits, no thyromegaly LUNGS:  Clear to auscultation bilaterally CHEST:  Well healed sternotomy scar. HEART:  PMI not displaced or sustained,S1 and S2 within normal limits, no S3,  no clicks, no rubs, no murmurs, irregular ABD:  Flat, positive bowel sounds normal in frequency in pitch, no bruits, no rebound, no guarding, no midline pulsatile mass, no hepatomegaly, no splenomegaly EXT:  2 plus pulses throughout, moderate bilateral leg edema, no cyanosis no clubbing  EKG:  EKG is not ordered today.   Recent Labs: 08/13/2020: Magnesium 2.1 12/26/2020: BUN 20; Creatinine, Ser 1.10; Hemoglobin 11.9; Potassium 4.2; Sodium 143    Lipid Panel No results found for: CHOL, TRIG, HDL, CHOLHDL, VLDL, LDLCALC, LDLDIRECT    Wt Readings from Last 3 Encounters:  02/09/21 (!) 309 lb (140.2 kg)  01/19/21 (!) 304 lb 3.2 oz (138 kg)  01/01/21 (!) 308 lb 9.6 oz (140 kg)      Other studies Reviewed: Additional studies/ records that were reviewed today include:  Echo Review of the above records demonstrates:  Please see elsewhere in the note.     ASSESSMENT AND PLAN:  ATRIAL FIB:  Philip [name] has a CHA2DS2 - VASc score of 2.    He is to have ablation.   He is due for an ablation.  He remains on Tikosyn.  I will add a magnesium to his upcoming labs.      MITRAL REPAIR:    He has stable repair.  No change in therapy.  AVR:   This was stable on recent echo.    HTN: The blood pressure is well  controlled.  He will continue the meds as listed.  OBESITY:    He has maintained his weight loss.   Current medicines are reviewed at length with the patient today.  The patient does not have concerns regarding medicines.  The following changes have been made:  None Labs/ tests ordered today include: None   Orders Placed This Encounter  Procedures   Magnesium      Disposition:   FU with a new cardiologist as he is moving to [facility].  He is going to take care of his father.  His mother is in a memory care unit.  Signed, Rollene Rotunda, MD  02/09/2021 9:42 AM    [facility]

## 2021-02-09 ENCOUNTER — Encounter: Payer: Self-pay | Admitting: Cardiology

## 2021-02-09 ENCOUNTER — Other Ambulatory Visit: Payer: Self-pay

## 2021-02-09 ENCOUNTER — Ambulatory Visit: Payer: 59 | Admitting: Cardiology

## 2021-02-09 VITALS — BP 118/70 | HR 60 | Ht 76.0 in | Wt 309.0 lb

## 2021-02-09 DIAGNOSIS — I340 Nonrheumatic mitral (valve) insufficiency: Secondary | ICD-10-CM

## 2021-02-09 DIAGNOSIS — Z952 Presence of prosthetic heart valve: Secondary | ICD-10-CM | POA: Diagnosis not present

## 2021-02-09 DIAGNOSIS — I10 Essential (primary) hypertension: Secondary | ICD-10-CM

## 2021-02-09 DIAGNOSIS — I4819 Other persistent atrial fibrillation: Secondary | ICD-10-CM | POA: Diagnosis not present

## 2021-02-09 NOTE — Patient Instructions (Signed)
Medication Instructions:  Your physician recommends that you continue on your current medications as directed. Please refer to the Current Medication list given to you today.   Labwork: BMET/CBC/MAGNESIUM PRIOR TO ABLATION   Testing/Procedures: NONE  Follow-Up: IN [place] WHEN YOU MOVE

## 2021-02-27 ENCOUNTER — Other Ambulatory Visit: Payer: 59 | Admitting: *Deleted

## 2021-02-27 ENCOUNTER — Other Ambulatory Visit: Payer: Self-pay

## 2021-02-27 DIAGNOSIS — I4819 Other persistent atrial fibrillation: Secondary | ICD-10-CM

## 2021-02-27 DIAGNOSIS — Z952 Presence of prosthetic heart valve: Secondary | ICD-10-CM

## 2021-02-27 DIAGNOSIS — I10 Essential (primary) hypertension: Secondary | ICD-10-CM

## 2021-02-27 LAB — CBC WITH DIFFERENTIAL/PLATELET
Basophils Absolute: 0.1 10*3/uL (ref 0.0–0.2)
Basos: 1 %
EOS (ABSOLUTE): 0.1 10*3/uL (ref 0.0–0.4)
Eos: 2 %
Hematocrit: 38.7 % (ref 37.5–51.0)
Hemoglobin: 12.4 g/dL — ABNORMAL LOW (ref 13.0–17.7)
Immature Grans (Abs): 0 10*3/uL (ref 0.0–0.1)
Immature Granulocytes: 0 %
Lymphocytes Absolute: 1.1 10*3/uL (ref 0.7–3.1)
Lymphs: 17 %
MCH: 29.5 pg (ref 26.6–33.0)
MCHC: 32 g/dL (ref 31.5–35.7)
MCV: 92 fL (ref 79–97)
Monocytes Absolute: 0.5 10*3/uL (ref 0.1–0.9)
Monocytes: 8 %
Neutrophils Absolute: 4.6 10*3/uL (ref 1.4–7.0)
Neutrophils: 72 %
Platelets: 172 10*3/uL (ref 150–450)
RBC: 4.21 x10E6/uL (ref 4.14–5.80)
RDW: 12.3 % (ref 11.6–15.4)
WBC: 6.4 10*3/uL (ref 3.4–10.8)

## 2021-02-27 LAB — BASIC METABOLIC PANEL
BUN/Creatinine Ratio: 18 (ref 9–20)
BUN: 21 mg/dL (ref 6–24)
CO2: 26 mmol/L (ref 20–29)
Calcium: 9.3 mg/dL (ref 8.7–10.2)
Chloride: 100 mmol/L (ref 96–106)
Creatinine, Ser: 1.14 mg/dL (ref 0.76–1.27)
Glucose: 89 mg/dL (ref 70–99)
Potassium: 3.9 mmol/L (ref 3.5–5.2)
Sodium: 142 mmol/L (ref 134–144)
eGFR: 74 mL/min/{1.73_m2} (ref >59)

## 2021-02-27 LAB — TSH: TSH: 2.96 u[IU]/mL (ref 0.450–4.500)

## 2021-02-27 LAB — MAGNESIUM: Magnesium: 1.9 mg/dL (ref 1.6–2.3)

## 2021-03-02 ENCOUNTER — Other Ambulatory Visit: Payer: Self-pay | Admitting: *Deleted

## 2021-03-02 MED ORDER — MAGNESIUM OXIDE 400 MG PO CAPS: 400.0000 mg | ORAL_CAPSULE | Freq: Every day | ORAL | 3 refills | Status: DC

## 2021-03-02 NOTE — Progress Notes (Signed)
Magnesium ox

## 2021-03-11 ENCOUNTER — Telehealth (HOSPITAL_COMMUNITY): Payer: Self-pay | Admitting: *Deleted

## 2021-03-11 NOTE — Telephone Encounter (Signed)
Patient returning call regarding upcoming cardiac imaging study; pt verbalizes understanding of appt date/time, parking situation and where to check in, pre-test NPO status and medications ordered, and verified current allergies; name and call back number provided for further questions should they arise  Philip Brick RN Navigator Cardiac Imaging [facility] [phone] office [phone] cell  He will take his daily metoprolol two hours prior to cardiac CT. He is aware to arrive at 1pm for his 1:30pm scan.

## 2021-03-11 NOTE — Telephone Encounter (Signed)
Attempted to call patient regarding upcoming cardiac CT appointment. °Left message on voicemail with name and callback number ° °Pasco Marchitto RN Navigator Cardiac Imaging °[facility] °[phone] Office °[phone] Cell ° °

## 2021-03-13 ENCOUNTER — Ambulatory Visit (HOSPITAL_COMMUNITY)
Admission: RE | Admit: 2021-03-13 | Discharge: 2021-03-13 | Disposition: A | Discharge status: Home / Self Care | Payer: 59 | Source: Ambulatory Visit | Attending: Internal Medicine | Admitting: Internal Medicine

## 2021-03-13 ENCOUNTER — Other Ambulatory Visit: Payer: Self-pay

## 2021-03-13 DIAGNOSIS — I4819 Other persistent atrial fibrillation: Secondary | ICD-10-CM | POA: Insufficient documentation

## 2021-03-13 MED ORDER — IOHEXOL 350 MG/ML SOLN
125.0000 mL | Freq: Once | INTRAVENOUS | Status: AC | PRN
  Administered 2021-03-13: 125 mL via INTRAVENOUS

## 2021-03-16 ENCOUNTER — Encounter: Payer: Self-pay | Admitting: Internal Medicine

## 2021-03-18 NOTE — Pre-Procedure Instructions (Signed)
Instructed patient on the following items: Arrival time 0530 Nothing to eat or drink after midnight No meds AM of procedure Responsible person to drive you home and stay with you for 24 hrs  Have you missed any doses of anti-coagulant Xarelto- hasn't missed doses

## 2021-03-19 ENCOUNTER — Ambulatory Visit (HOSPITAL_COMMUNITY)
Admission: RE | Admit: 2021-03-19 | Discharge: 2021-03-19 | Disposition: A | Discharge status: Home / Self Care | Payer: 59 | Attending: Internal Medicine | Admitting: Internal Medicine

## 2021-03-19 ENCOUNTER — Other Ambulatory Visit: Payer: Self-pay

## 2021-03-19 ENCOUNTER — Encounter (HOSPITAL_COMMUNITY): Payer: Self-pay | Admitting: Internal Medicine

## 2021-03-19 ENCOUNTER — Ambulatory Visit (HOSPITAL_COMMUNITY): Payer: 59 | Admitting: Anesthesiology

## 2021-03-19 ENCOUNTER — Encounter (HOSPITAL_COMMUNITY)
Admission: RE | Disposition: A | Discharge status: Home / Self Care | Payer: 59 | Source: Home / Self Care | Attending: Internal Medicine

## 2021-03-19 DIAGNOSIS — Z7901 Long term (current) use of anticoagulants: Secondary | ICD-10-CM | POA: Diagnosis not present

## 2021-03-19 DIAGNOSIS — I4819 Other persistent atrial fibrillation: Secondary | ICD-10-CM

## 2021-03-19 HISTORY — PX: ATRIAL FIBRILLATION ABLATION: EP1191

## 2021-03-19 LAB — GLUCOSE, CAPILLARY
Glucose-Capillary: 142 mg/dL — ABNORMAL HIGH (ref 70–99)
Glucose-Capillary: 147 mg/dL — ABNORMAL HIGH (ref 70–99)

## 2021-03-19 LAB — POCT ACTIVATED CLOTTING TIME
Activated Clotting Time: 239 seconds
Activated Clotting Time: 251 seconds
Activated Clotting Time: 257 seconds

## 2021-03-19 SURGERY — ATRIAL FIBRILLATION ABLATION
Anesthesia: General

## 2021-03-19 MED ORDER — ONDANSETRON HCL 4 MG/2ML IJ SOLN
INTRAMUSCULAR | Status: DC | PRN
  Administered 2021-03-19: 4 mg via INTRAVENOUS

## 2021-03-19 MED ORDER — SODIUM CHLORIDE 0.9% FLUSH: 3.0000 mL | Freq: Two times a day (BID) | INTRAVENOUS | Status: DC

## 2021-03-19 MED ORDER — SUGAMMADEX SODIUM 200 MG/2ML IV SOLN
INTRAVENOUS | Status: DC | PRN
  Administered 2021-03-19: 200 mg via INTRAVENOUS

## 2021-03-19 MED ORDER — LIDOCAINE 2% (20 MG/ML) 5 ML SYRINGE
INTRAMUSCULAR | Status: DC | PRN
  Administered 2021-03-19: 100 mg via INTRAVENOUS

## 2021-03-19 MED ORDER — HEPARIN (PORCINE) IN NACL 2000-0.9 UNIT/L-% IV SOLN
INTRAVENOUS | Status: AC
  Filled 2021-03-19: qty 1000

## 2021-03-19 MED ORDER — PHENYLEPHRINE HCL-NACL 20-0.9 MG/250ML-% IV SOLN
INTRAVENOUS | Status: DC | PRN
  Administered 2021-03-19: 15 ug/min via INTRAVENOUS

## 2021-03-19 MED ORDER — FENTANYL CITRATE (PF) 100 MCG/2ML IJ SOLN: 25.0000 ug | INTRAMUSCULAR | Status: DC | PRN

## 2021-03-19 MED ORDER — SODIUM CHLORIDE 0.9% FLUSH: 3.0000 mL | INTRAVENOUS | Status: DC | PRN

## 2021-03-19 MED ORDER — HEPARIN (PORCINE) IN NACL 1000-0.9 UT/500ML-% IV SOLN
INTRAVENOUS | Status: DC | PRN
  Administered 2021-03-19 (×3): 500 mL

## 2021-03-19 MED ORDER — SUCCINYLCHOLINE CHLORIDE 200 MG/10ML IV SOSY
PREFILLED_SYRINGE | INTRAVENOUS | Status: DC | PRN
  Administered 2021-03-19: 140 mg via INTRAVENOUS

## 2021-03-19 MED ORDER — MIDAZOLAM HCL 5 MG/5ML IJ SOLN
INTRAMUSCULAR | Status: DC | PRN
  Administered 2021-03-19: 2 mg via INTRAVENOUS

## 2021-03-19 MED ORDER — HEPARIN SODIUM (PORCINE) 1000 UNIT/ML IJ SOLN
INTRAMUSCULAR | Status: DC | PRN
  Administered 2021-03-19 (×2): 4000 [IU] via INTRAVENOUS
  Administered 2021-03-19: 5000 [IU] via INTRAVENOUS

## 2021-03-19 MED ORDER — FENTANYL CITRATE (PF) 250 MCG/5ML IJ SOLN
INTRAMUSCULAR | Status: DC | PRN
  Administered 2021-03-19: 25 ug via INTRAVENOUS
  Administered 2021-03-19: 50 ug via INTRAVENOUS
  Administered 2021-03-19: 25 ug via INTRAVENOUS

## 2021-03-19 MED ORDER — SODIUM CHLORIDE 0.9 % IV SOLN: 250.0000 mL | INTRAVENOUS | Status: DC | PRN

## 2021-03-19 MED ORDER — HYDROCODONE-ACETAMINOPHEN 5-325 MG PO TABS: 1.0000 | ORAL_TABLET | ORAL | Status: DC | PRN

## 2021-03-19 MED ORDER — ROCURONIUM BROMIDE 10 MG/ML (PF) SYRINGE
PREFILLED_SYRINGE | INTRAVENOUS | Status: DC | PRN
  Administered 2021-03-19: 100 mg via INTRAVENOUS

## 2021-03-19 MED ORDER — PROPOFOL 10 MG/ML IV BOLUS
INTRAVENOUS | Status: DC | PRN
  Administered 2021-03-19: 130 mg via INTRAVENOUS

## 2021-03-19 MED ORDER — ACETAMINOPHEN 325 MG PO TABS
650.0000 mg | ORAL_TABLET | ORAL | Status: DC | PRN
  Filled 2021-03-19: qty 2

## 2021-03-19 MED ORDER — HEPARIN SODIUM (PORCINE) 1000 UNIT/ML IJ SOLN
INTRAMUSCULAR | Status: DC | PRN
  Administered 2021-03-19: 1000 [IU] via INTRAVENOUS
  Administered 2021-03-19: 15000 [IU] via INTRAVENOUS

## 2021-03-19 MED ORDER — PHENYLEPHRINE 40 MCG/ML (10ML) SYRINGE FOR IV PUSH (FOR BLOOD PRESSURE SUPPORT)
PREFILLED_SYRINGE | INTRAVENOUS | Status: DC | PRN
  Administered 2021-03-19: 120 ug via INTRAVENOUS
  Administered 2021-03-19: 80 ug via INTRAVENOUS

## 2021-03-19 MED ORDER — SODIUM CHLORIDE 0.9 % IV SOLN: INTRAVENOUS | Status: DC

## 2021-03-19 MED ORDER — DEXAMETHASONE SODIUM PHOSPHATE 10 MG/ML IJ SOLN
INTRAMUSCULAR | Status: DC | PRN
  Administered 2021-03-19: 10 mg via INTRAVENOUS

## 2021-03-19 MED ORDER — ONDANSETRON HCL 4 MG/2ML IJ SOLN: 4.0000 mg | Freq: Four times a day (QID) | INTRAMUSCULAR | Status: DC | PRN

## 2021-03-19 MED ORDER — HEPARIN SODIUM (PORCINE) 1000 UNIT/ML IJ SOLN
INTRAMUSCULAR | Status: AC
  Filled 2021-03-19: qty 10

## 2021-03-19 MED ORDER — PROTAMINE SULFATE 10 MG/ML IV SOLN
INTRAVENOUS | Status: DC | PRN
  Administered 2021-03-19: 40 mg via INTRAVENOUS

## 2021-03-19 SURGICAL SUPPLY — 18 items
BLANKET WARM UNDERBOD FULL ACC (MISCELLANEOUS) ×2 IMPLANT
CATH OCTARAY 2.0 F 3-3-3-3-3 (CATHETERS) ×1 IMPLANT
CATH SMTCH THERMOCOOL SF DF (CATHETERS) ×1 IMPLANT
CATH SOUNDSTAR ECO 8FR (CATHETERS) ×1 IMPLANT
CATH WEB BI DIR CSDF CRV REPRO (CATHETERS) ×1 IMPLANT
CLOSURE PERCLOSE PROSTYLE (VASCULAR PRODUCTS) ×3 IMPLANT
COVER SWIFTLINK CONNECTOR (BAG) ×3 IMPLANT
NDL BAYLIS TRANSSEPTAL 71CM (NEEDLE) IMPLANT
NEEDLE BAYLIS TRANSSEPTAL 71CM (NEEDLE) ×2 IMPLANT
PACK EP LATEX FREE (CUSTOM PROCEDURE TRAY) ×2
PACK EP LF (CUSTOM PROCEDURE TRAY) ×1 IMPLANT
PAD DEFIB RADIO PHYSIO CONN (PAD) ×2 IMPLANT
PATCH CARTO3 (PAD) ×1 IMPLANT
SHEATH PINNACLE 7F 10CM (SHEATH) ×2 IMPLANT
SHEATH PINNACLE 9F 10CM (SHEATH) ×1 IMPLANT
SHEATH PROBE COVER 6X72 (BAG) ×1 IMPLANT
SHEATH SWARTZ TS SL2 63CM 8.5F (SHEATH) ×1 IMPLANT
TUBING SMART ABLATE COOLFLOW (TUBING) ×1 IMPLANT

## 2021-03-19 NOTE — Anesthesia Procedure Notes (Signed)
Procedure Name: Intubation Date/Time: 03/19/2021 7:53 AM Performed by: Kyung Rudd, CRNA Pre-anesthesia Checklist: Patient identified, Emergency Drugs available, Suction available and Patient being monitored Patient Re-evaluated:Patient Re-evaluated prior to induction Oxygen Delivery Method: Circle system utilized Preoxygenation: Pre-oxygenation with 100% oxygen Induction Type: IV induction and Rapid sequence Laryngoscope Size: Mac and 4 Grade View: Grade I Tube type: Oral Tube size: 7.5 mm Number of attempts: 1 Airway Equipment and Method: Stylet Placement Confirmation: ETT inserted through vocal cords under direct vision, positive ETCO2 and breath sounds checked- equal and bilateral Secured at: 22 cm Tube secured with: Tape Dental Injury: Teeth and Oropharynx as per pre-operative assessment

## 2021-03-19 NOTE — Progress Notes (Signed)
Patient states he has a rash on his arms that started this morning.  He stayed in a hotel last night.  He states he did you different soap this morning.  He also states that he developed a rash/redness that is now peeling to his buttocks that started 6 hours after his CT scan.  He states he went to the dermatologist where he lives they gave him prednisone and some cream.  He states it is better.   Notified Dr Johney Frame, ok to procedure with procedure if patient is not in any distress and wants to have done.    Patient states wants to have procedure.

## 2021-03-19 NOTE — Transfer of Care (Signed)
Immediate Anesthesia Transfer of Care Note  Patient: Philip [name]  Procedure(s) Performed: ATRIAL FIBRILLATION ABLATION  Patient Location: Cath Lab  Anesthesia Type:General  Level of Consciousness: awake, alert  and oriented  Airway & Oxygen Therapy: Patient Spontanous Breathing and Patient connected to nasal cannula oxygen  Post-op Assessment: Report given to RN, Post -op Vital signs reviewed and stable and Patient moving all extremities  Post vital signs: Reviewed and stable  Last Vitals:  Vitals Value Taken Time  BP 92/58 03/19/21 1038  Temp    Pulse 66 03/19/21 1038  Resp 17 03/19/21 1038  SpO2 99 % 03/19/21 1038  Vitals shown include unvalidated device data.  Last Pain:  Vitals:   03/19/21 1030  TempSrc: Temporal  PainSc: Asleep         Complications: No notable events documented.

## 2021-03-19 NOTE — Progress Notes (Signed)
Patient has rash and peeling on buttocks prior to arrival in lab this morning. Dr Johney Frame is aware.

## 2021-03-19 NOTE — Anesthesia Postprocedure Evaluation (Signed)
Anesthesia Post Note  Patient: Philip [name]  Procedure(s) Performed: ATRIAL FIBRILLATION ABLATION     Patient location during evaluation: Cath Lab Anesthesia Type: General Level of consciousness: awake Pain management: satisfactory to patient Vital Signs Assessment: post-procedure vital signs reviewed and stable Respiratory status: spontaneous breathing Postop Assessment: no apparent nausea or vomiting Anesthetic complications: no   No notable events documented.  Last Vitals:  Vitals:   03/19/21 1230 03/19/21 1306  BP: 94/70 114/71  Pulse: 79 73  Resp: 12 18  Temp:    SpO2: 97% 97%    Last Pain:  Vitals:   03/19/21 1127  TempSrc:   PainSc: 2                  Donald Jacque

## 2021-03-19 NOTE — Anesthesia Preprocedure Evaluation (Addendum)
Anesthesia Evaluation  Patient identified by MRN, date of birth, ID band Patient awake    Reviewed: Allergy & Precautions, NPO status , Patient's Chart, lab work & pertinent test results  Airway Mallampati: II  TM Distance: >3 FB     Dental   Pulmonary sleep apnea , former smoker,    breath sounds clear to auscultation       Cardiovascular hypertension,  Rhythm:Regular Rate:Normal     Neuro/Psych    GI/Hepatic Neg liver ROS, PUD, GERD  ,  Endo/Other  diabetes  Renal/GU negative Renal ROS     Musculoskeletal  (+) Arthritis ,   Abdominal   Peds  Hematology  (+) anemia ,   Anesthesia Other Findings   Reproductive/Obstetrics                             Anesthesia Physical Anesthesia Plan  ASA: 3  Anesthesia Plan: General   Post-op Pain Management:    Induction: Intravenous  PONV Risk Score and Plan: 3 and Ondansetron, Dexamethasone and Midazolam  Airway Management Planned: Oral ETT  Additional Equipment:   Intra-op Plan:   Post-operative Plan: Extubation in OR  Informed Consent: I have reviewed the patients History and Physical, chart, labs and discussed the procedure including the risks, benefits and alternatives for the proposed anesthesia with the patient or authorized representative who has indicated his/her understanding and acceptance.     Dental advisory given  Plan Discussed with: Anesthesiologist and CRNA  Anesthesia Plan Comments:         Anesthesia Quick Evaluation

## 2021-03-19 NOTE — H&P (Signed)
PCP: Robyne Peers, MD Primary Cardiologist: Dr Percival Spanish Primary EP: Dr Ronna Polio Philip [name] is a 60 y.o. male who presents today for electrophysiology study and ablation for afib.  Since last being seen in our clinic, the patient reports doing reasonably well.  He continues to struggle with symptomatic afib.  + SOB and fatigue.   He developed a rash on his lower back and buttocks about 6 hours after his cardiac CT.  He was seen by dermatology who started prednisone and triamcinolone cream.  He feels that this is improving.  He has a mild rash on both forearms which started this morning.  He wishes to proceed. Today, he denies symptoms of palpitations, chest pain,  lower extremity edema, dizziness, presyncope, or syncope.  The patient is otherwise without complaint today.        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   Hx of bacterial endocarditis 11/2003   Hypertension     Neuropathy      feet bilat    Obesity     Persistent atrial fibrillation (HCC)     Sleep apnea      uses CPAP nightly   Toe deformity 08/2011    right claw hallux         Past Surgical History:  Procedure Laterality Date   adenoid surgery        AORTIC VALVE REPLACEMENT   11/2003   CARDIOVERSION N/A 07/25/2017    Procedure: CARDIOVERSION;  Surgeon: Skeet Latch, MD;  Location: [place];  Service: Cardiovascular;  Laterality: N/A;   CARDIOVERSION N/A 09/07/2017    Procedure: CARDIOVERSION;  Surgeon: Pixie Casino, MD;  Location: [facility] ENDOSCOPY;  Service: Cardiovascular;  Laterality: N/A;   CARDIOVERSION N/A 09/29/2017    Procedure:  CARDIOVERSION;  Surgeon: Sanda Klein, MD;  Location: [place] ENDOSCOPY;  Service: Cardiovascular;  Laterality: N/A;   CARDIOVERSION N/A 12/26/2020    Procedure: CARDIOVERSION;  Surgeon: Josue Hector, MD;  Location: [facility] ENDOSCOPY;  Service: Cardiovascular;  Laterality: N/A;   COLONOSCOPY WITH PROPOFOL N/A 03/30/2017    Procedure: COLONOSCOPY WITH PROPOFOL;  Surgeon: Yetta Flock, MD;  Location: [facility] ENDOSCOPY;  Service: Gastroenterology;  Laterality: N/A;   ESOPHAGOGASTRODUODENOSCOPY (EGD) WITH PROPOFOL N/A 03/29/2017    Procedure: ESOPHAGOGASTRODUODENOSCOPY (EGD) WITH PROPOFOL;  Surgeon: Yetta Flock, MD;  Location: [facility] ENDOSCOPY;  Service: Gastroenterology;  Laterality: N/A;   GASTRIC BYPASS   03/2004   GIVENS CAPSULE STUDY N/A 03/30/2017    Procedure: GIVENS CAPSULE STUDY;  Surgeon: Yetta Flock, MD;  Location: [facility] ENDOSCOPY;  Service: Gastroenterology;  Laterality: N/A;   KNEE ARTHROSCOPY   08/09/2007 - left    07/15/2003 - right   METATARSAL OSTEOTOMY   03/20/2010    right 1st MT; gastroc soleus recession   NASAL SINUS SURGERY       TENDON RELEASE   09/09/2011    Procedure: HEEL CORD LENGTHENING;  Surgeon: Wylene Simmer, MD;  Location: [facility];  Service: Orthopedics;  Laterality: Right;  Righ tachilles tendon lengthening   TOE FUSION   07/12/2005    left 3rd toe PIP and DIP fusion   TONSILLECTOMY AND ADENOIDECTOMY       TOTAL KNEE ARTHROPLASTY Left 03/18/2015    Procedure: LEFT TOTAL KNEE ARTHROPLASTY;  Surgeon: Durene Romans, MD;  Location: [facility];  Service: Orthopedics;  Laterality: Left;      ROS- all systems are reviewed and negatives except as per HPI above         Current Outpatient Medications  Medication Sig Dispense Refill   Accu-Chek FastClix Lancets MISC 2 (two) times daily. as directed       ACCU-CHEK GUIDE test strip 2 (two) times daily.       Ascorbic Acid (VITAMIN C) 1000 MG tablet Take 1,000 mg by mouth in the morning.       buPROPion  (WELLBUTRIN XL) 150 MG 24 hr tablet Take 150 mg by mouth every morning.       Calcium Carb-Cholecalciferol (CALCIUM 600+D3 PO) Take 1 tablet by mouth in the morning and at bedtime.       cetirizine (ZYRTEC) 10 MG tablet Take 10 mg by mouth daily.        clindamycin (CLEOCIN) 150 MG capsule Take 4 tablets by mouth 1 hour prior to seeing the dentist       dofetilide (TIKOSYN) 250 MCG capsule Take 1 capsule (250 mcg total) by mouth 2 (two) times daily. 180 capsule 1   esomeprazole (NEXIUM) 40 MG capsule Take 40 mg by mouth every evening.        ferrous sulfate 325 (65 FE) MG tablet Take 325 mg by mouth every evening.       furosemide (LASIX) 40 MG tablet Take 1 tablet (40 mg total) by mouth daily. 30 tablet 6   KLOR-CON M20 20 MEQ tablet TAKE 1 TABLET DAILY, AND 1 EXTRA TABLET ON DAYS       TAKING  METOLAZONE. 95 tablet 2   losartan (COZAAR) 25 MG tablet Take 25 mg by mouth every evening.        metolazone (ZAROXOLYN) 5 MG tablet Take 5 mg by mouth daily as needed (fluid retention (average once weekly)).       metoprolol succinate (TOPROL-XL) 50 MG 24 hr tablet Take 1 tablet (50 mg total) by mouth daily. Take with or immediately following a meal. 90 tablet 3   nystatin-triamcinolone (MYCOLOG II) cream Apply 1 application topically 3 (three) times daily as needed (yeast/irritation).       simvastatin (ZOCOR) 10 MG tablet Take 10 mg by mouth every evening.        sitaGLIPtin-metformin (JANUMET) 50-1000 MG tablet Take 1 tablet by mouth 2 (two) times daily with a meal.        XARELTO 20 MG TABS tablet TAKE 1 TABLET DAILY WITH   SUPPER 90 tablet 3    No current facility-administered medications for this visit.             Facility-Administered Medications Ordered in Other Visits  Medication Dose Route Frequency Provider Last Rate Last Admin   perflutren lipid microspheres (DEFINITY) IV suspension  1-10 mL Intravenous PRN Rollene Rotunda, MD   3 mL at 01/19/21 0853      Physical Exam: Vitals:    03/19/21 0541  BP: 117/85  Pulse: 62  Temp: 98.2 F (36.8 C)  SpO2: 99%    GEN- The patient is well appearing, alert and  oriented x 3 today.   Head- normocephalic, atraumatic Eyes-  Sclera clear, conjunctiva pink Ears- hearing intact Oropharynx- clear Lungs- Clear to ausculation bilaterally, normal work of breathing Heart- iRRR GI- soft, NT, ND, + BS Extremities- no clubbing, cyanosis, or edema      Wt Readings from Last 3 Encounters:  01/19/21 (!) 304 lb 3.2 oz (138 kg)  01/01/21 (!) 308 lb 9.6 oz (140 kg)  12/17/20 (!) 317 lb 3.2 oz (143.9 kg)    Echo today reveals EF 60%, severe LA enlargement (5.5 cm)      Assessment and Plan:   Persistent afib The patient has symptomatic, recurrent atrial fibrillation. he has failed medical therapy with tikosyn. Chads2vasc score is 3.  he is anticoagulated with xarelto . He has developed a rash, likely from his cardiac CT which is healing and for which he has been treated this past week by dermatology with steroids.  Risk, benefits, and alternatives to EP study and radiofrequency ablation for afib were again discussed in detail today. These risks include but are not limited to stroke, bleeding, vascular damage, tamponade, perforation, damage to the esophagus, lungs, and other structures, pulmonary vein stenosis, worsening renal function, and death. The patient understands these risk and wishes to proceed.    Cardiac CT reviewed at length with the patient today.  he reports compliance with [place] without interruption.  Thompson Grayer MD, [place] 03/19/2021 7:11 AM

## 2021-04-09 DIAGNOSIS — Z8679 Personal history of other diseases of the circulatory system: Secondary | ICD-10-CM | POA: Insufficient documentation

## 2021-04-09 DIAGNOSIS — Z9889 Other specified postprocedural states: Secondary | ICD-10-CM | POA: Insufficient documentation

## 2021-04-09 DIAGNOSIS — I38 Endocarditis, valve unspecified: Secondary | ICD-10-CM | POA: Insufficient documentation

## 2021-04-09 DIAGNOSIS — G4733 Obstructive sleep apnea (adult) (pediatric): Secondary | ICD-10-CM | POA: Insufficient documentation

## 2021-04-16 ENCOUNTER — Ambulatory Visit (HOSPITAL_COMMUNITY)
Admission: RE | Admit: 2021-04-16 | Discharge: 2021-04-16 | Disposition: A | Discharge status: Home / Self Care | Payer: 59 | Source: Ambulatory Visit | Attending: Nurse Practitioner | Admitting: Nurse Practitioner

## 2021-04-16 ENCOUNTER — Other Ambulatory Visit: Payer: Self-pay

## 2021-04-16 VITALS — BP 104/72 | HR 65 | Ht 76.0 in | Wt 305.6 lb

## 2021-04-16 DIAGNOSIS — D649 Anemia, unspecified: Secondary | ICD-10-CM | POA: Insufficient documentation

## 2021-04-16 DIAGNOSIS — Z09 Encounter for follow-up examination after completed treatment for conditions other than malignant neoplasm: Secondary | ICD-10-CM | POA: Insufficient documentation

## 2021-04-16 DIAGNOSIS — I110 Hypertensive heart disease with heart failure: Secondary | ICD-10-CM | POA: Insufficient documentation

## 2021-04-16 DIAGNOSIS — Z6837 Body mass index (BMI) 37.0-37.9, adult: Secondary | ICD-10-CM | POA: Diagnosis not present

## 2021-04-16 DIAGNOSIS — Z7901 Long term (current) use of anticoagulants: Secondary | ICD-10-CM | POA: Insufficient documentation

## 2021-04-16 DIAGNOSIS — D6869 Other thrombophilia: Secondary | ICD-10-CM | POA: Diagnosis not present

## 2021-04-16 DIAGNOSIS — Z87891 Personal history of nicotine dependence: Secondary | ICD-10-CM | POA: Insufficient documentation

## 2021-04-16 DIAGNOSIS — I4819 Other persistent atrial fibrillation: Secondary | ICD-10-CM | POA: Insufficient documentation

## 2021-04-16 DIAGNOSIS — G4733 Obstructive sleep apnea (adult) (pediatric): Secondary | ICD-10-CM | POA: Insufficient documentation

## 2021-04-16 DIAGNOSIS — I5033 Acute on chronic diastolic (congestive) heart failure: Secondary | ICD-10-CM | POA: Insufficient documentation

## 2021-04-16 DIAGNOSIS — Z953 Presence of xenogenic heart valve: Secondary | ICD-10-CM | POA: Insufficient documentation

## 2021-04-16 DIAGNOSIS — Z79899 Other long term (current) drug therapy: Secondary | ICD-10-CM | POA: Insufficient documentation

## 2021-04-16 DIAGNOSIS — E119 Type 2 diabetes mellitus without complications: Secondary | ICD-10-CM | POA: Diagnosis not present

## 2021-04-16 NOTE — Progress Notes (Signed)
Primary Care Physician: Philip Peers, MD Primary Cardiologist: Philip [name] Primary Electrophysiologist: Philip [name] Philip [name] is a 60 y.o. male with a history of AVR 2005, HTN, OSA on CPAPA, DM,persistent atrial fibrillation who presents for follow up in the [facility].  Since discharge for Tikosyn load in June 2019, the patient reports doing very well.  His energy level has improved.   He has not noted any afib. He remains on tikosyn and xareltofor a CHA2DS2VASc score of 3.  He reports going on a keto diet and losing 35 lbs in 2 weeks. He has now leveled off. SInce losing weight he has had significant improvement in his ankle edema.He is chronically anemic sine GI bleed in 2018. His PCP is planning s stool card to check for occult bleed.   F/u in afib clinic, 08/12/20 for Tikosyn surveillance. He is feeling well. Reports no afib. He feels well. Labs were checked one month ago, K+ stable but mag was low for Tikosyn at 1.7. Will recheck today. He is using CPAP.   F/u in afib clinic, 12/17/20. He asked to be seen as he had noted increase of fluid retention and return of afib around 2 weeks ago. He is being complaint with tikosyn. He is now living with his father in [place]., but this is not a permanent change. He did have an ER visit  in [place], visiting  his girlfriend and took a Delta 8, marijuana gummy, first time use,  and became very sedated to the point he was not answering questions, so EMS was called.  CAN view visit in Epic as well as labs on 8/26. He was given IV fluids and became more responsive and d/c home. He was told he was in afib then. He took metolazone x one last week but did not lose that much fluid. He feels that 300 lbs is his dry weight. According to his last weight here not much change in weight. States no missed anticoagulin for at least 3 weeks.   F/u in afib clinic, 915/22. Unfortunately, he did not shock out of afib. He is rate controlled.  He is symptomatic with afib with fatigue. He has lost some weight since I saw him last  time. He has started on keto diet. He is pending an echo 10/3. I will refer back to Philip [name] as he has failed Tikosyn to see if ablation candidate. He will continue tikosyn for now.   F/u in afib clinic, 04/16/21. He is here s/p ablation, 03/19/21. No swallowing or groin issues, he feels well. He has not noted any afib. Continues on Tikosyn and xarelto.   Today, he  denies symptoms of palpitations, chest pain, shortness of breath, orthopnea, PND, lower extremity edema, dizziness, presyncope, syncope, snoring, daytime somnolence, bleeding, or neurologic sequela. The patient is tolerating medications without difficulties and is otherwise without complaint today.    Atrial Fibrillation Risk Factors:  he does have symptoms or diagnosis of sleep apnea. he is compliant with CPAP therapy.  he does not have a history of rheumatic fever.  he has a BMI of Body mass index is 37.2 kg/m.Marland Kitchen Filed Weights   04/16/21 0904  Weight: (!) 138.6 kg    LA size: 46   Atrial Fibrillation Management history:  Previous antiarrhythmic drugs: Flecainide -> Tikosyn  Previous cardioversions: 07/2017, 08/2017  Previous ablations: none  CHADS2VASC score: 3  Anticoagulation history: Xarelto   Past Medical History:  Diagnosis Date  Arthritis    Dental crowns present    Diabetes mellitus    NIDDM   Diabetic foot ulcer (HCC) 08/2011   right foot   Fatty liver    Gastric ulcer    no current problems   GERD (gastroesophageal reflux disease)    Gout    H/O mitral valve repair 11/2003   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   Hx of bacterial endocarditis 11/2003   Hypertension    Neuropathy    feet bilat    Obesity    Persistent atrial fibrillation (HCC)    Sleep apnea    uses CPAP nightly   Toe deformity 08/2011   right claw hallux   Past Surgical  History:  Procedure Laterality Date   adenoid surgery      AORTIC VALVE REPLACEMENT  11/2003   ATRIAL FIBRILLATION ABLATION N/A 03/19/2021   Procedure: ATRIAL FIBRILLATION ABLATION;  Surgeon: Philip Range, MD;  Location: [facility] INVASIVE CV LAB;  Service: Cardiovascular;  Laterality: N/A;   CARDIOVERSION N/A 07/25/2017   Procedure: CARDIOVERSION;  Surgeon: Philip Si, MD;  Location: [facility] ENDOSCOPY;  Service: Cardiovascular;  Laterality: N/A;   CARDIOVERSION N/A 09/07/2017   Procedure: CARDIOVERSION;  Surgeon: Philip Nose, MD;  Location: [facility] ENDOSCOPY;  Service: Cardiovascular;  Laterality: N/A;   CARDIOVERSION N/A 09/29/2017   Procedure: CARDIOVERSION;  Surgeon: Philip Fair, MD;  Location: [facility] ENDOSCOPY;  Service: Cardiovascular;  Laterality: N/A;   CARDIOVERSION N/A 12/26/2020   Procedure: CARDIOVERSION;  Surgeon: Philip Stade, MD;  Location: [facility] ENDOSCOPY;  Service: Cardiovascular;  Laterality: N/A;   COLONOSCOPY WITH PROPOFOL N/A 03/30/2017   Procedure: COLONOSCOPY WITH PROPOFOL;  Surgeon: Philip Deeds, MD;  Location: [facility] ENDOSCOPY;  Service: Gastroenterology;  Laterality: N/A;   ESOPHAGOGASTRODUODENOSCOPY (EGD) WITH PROPOFOL N/A 03/29/2017   Procedure: ESOPHAGOGASTRODUODENOSCOPY (EGD) WITH PROPOFOL;  Surgeon: Philip Deeds, MD;  Location: [facility] ENDOS[facility];  Service: Gastroenterology;  Laterality: N/A;   GASTRIC BYPASS  03/2004   GIVENS CAPSULE STUDY N/A 03/30/2017   Procedure: GIVENS CAPSULE STUDY;  Surgeon: Philip Deeds, MD;  Location: [facility] ENDOSCOPY;  Service: Gastroenterology;  Laterality: N/A;   KNEE ARTHROSCOPY  08/09/2007 - left   07/15/2003 - right   METATARSAL OSTEOTOMY  03/20/2010   right 1st MT; gastroc soleus recession   NASAL SINUS SURGERY     TENDON RELEASE  09/09/2011   Procedure: HEEL CORD LENGTHENING;  Surgeon: Philip Arthurs, MD;  Location:  SURGERY CENTER;  Service: Orthopedics;  Laterality: Right;  Righ tachilles tendon lengthening   TOE FUSION   07/12/2005   left 3rd toe PIP and DIP fusion   TONSILLECTOMY AND ADENOIDECTOMY     TOTAL KNEE ARTHROPLASTY Left 03/18/2015   Procedure: LEFT TOTAL KNEE ARTHROPLASTY;  Surgeon: Durene Romans, MD;  Location: [facility];  Service: Orthopedics;  Laterality: Left;    Current Outpatient Medications  Medication Sig Dispense Refill   Accu-Chek FastClix Lancets MISC 2 (two) times daily. as directed     ACCU-CHEK GUIDE test strip 2 (two) times daily.     Ascorbic Acid (VITAMIN C) 1000 MG tablet Take 1,000 mg by mouth in the morning.     buPROPion (WELLBUTRIN XL) 150 MG 24 hr tablet Take 150 mg by mouth every morning.     Calcium Carb-Cholecalciferol (CALCIUM 600+D3 PO) Take 1 tablet by mouth in the morning and at bedtime.     cetirizine (ZYRTEC) 10 MG tablet Take 10 mg  by mouth daily.      clindamycin (CLEOCIN) 150 MG capsule Take 600 mg by mouth See admin instructions. Take 4 tablets by mouth 1 hour prior to seeing the dentist     dofetilide (TIKOSYN) 250 MCG capsule Take 1 capsule (250 mcg total) by mouth 2 (two) times daily. 180 capsule 1   esomeprazole (NEXIUM) 40 MG capsule Take 40 mg by mouth every evening.      ferrous sulfate 325 (65 FE) MG tablet Take 325 mg by mouth every evening.     furosemide (LASIX) 40 MG tablet Take 1 tablet (40 mg total) by mouth daily. 30 tablet 6   KLOR-CON M20 20 MEQ tablet TAKE 1 TABLET DAILY, AND 1 EXTRA TABLET ON DAYS       TAKING  METOLAZONE. 95 tablet 2   losartan (COZAAR) 25 MG tablet Take 25 mg by mouth every evening.      Magnesium Oxide 400 MG CAPS Take 1 capsule (400 mg total) by mouth daily. 90 capsule 3   metolazone (ZAROXOLYN) 5 MG tablet Take 5 mg by mouth daily as needed (fluid retention (average once weekly)).     metoprolol succinate (TOPROL-XL) 50 MG 24 hr tablet TAKE 1 TABLET DAILY, WITH  OR IMMEDIATELY FOLLOWING A MEAL 90 tablet 3   nystatin-triamcinolone (MYCOLOG II) cream Apply 1 application topically 3 (three) times daily as needed  (yeast/irritation).     sildenafil (VIAGRA) 100 MG tablet Take 100 mg by mouth as needed.     simvastatin (ZOCOR) 10 MG tablet Take 10 mg by mouth every evening.      sitaGLIPtin-metformin (JANUMET) 50-1000 MG tablet Take 1 tablet by mouth 2 (two) times daily with a meal.      tadalafil (CIALIS) 20 MG tablet Take 20 mg by mouth as needed.     triamcinolone cream (KENALOG) 0.1 % Apply 1 application topically 2 (two) times daily.     XARELTO 20 MG TABS tablet TAKE 1 TABLET DAILY WITH   SUPPER 90 tablet 3   No current facility-administered medications for this encounter.    Allergies  Allergen Reactions   Nsaids Swelling   Penicillins Hives, Swelling and Other (See Comments)    NO REACTION LISTED Has patient had a PCN reaction causing immediate rash, facial/tongue/throat swelling, SOB or lightheadedness with hypotension: Yes Has patient had a PCN reaction causing severe rash involving mucus membranes or skin necrosis: No Has patient had a PCN reaction that required hospitalization No Has patient had a PCN reaction occurring within the last 10 years: No If all of the above answers are "NO", then may proceed with Cephalosporin use.  Has taken Keflex w/o issue        Social History   Socioeconomic History   Marital status: Widowed    Spouse name: Not on file   Number of children: Not on file   Years of education: Not on file   Highest education level: Not on file  Occupational History   Not on file  Tobacco Use   Smoking status: Former    Packs/day: 1.00    Years: 2.00    Pack years: 2.00    Types: Cigars, Pipe, Cigarettes    Quit date: 01/31/2011    Years since quitting: 10.2   Smokeless tobacco: Never   Tobacco comments:    Former smoker (01/01/2021)  Vaping Use   Vaping Use: Never used  Substance and Sexual Activity   Alcohol use: Not Currently    Alcohol/week:  0.0 standard drinks   Drug use: No   Sexual activity: Not on file  Other Topics Concern   Not on file   Social History Narrative   Not on file   Social Determinants of Health   Financial Resource Strain: Not on file  Food Insecurity: Not on file  Transportation Needs: Not on file  Physical Activity: Not on file  Stress: Not on file  Social Connections: Not on file  Intimate Partner Violence: Not on file    Family History  Problem Relation Age of Onset   Alzheimer's disease Mother    Diabetes Father    Colon cancer Unknown        family history    ROS- All systems are reviewed and negative except as per the HPI above.  Physical Exam: Vitals:   04/16/21 0904  BP: 104/72  Pulse: 65  Weight: (!) 138.6 kg  Height: 6\' 4"  (1.93 m)    GEN- The patient is well appearing, alert and oriented x 3 today.   Head- normocephalic, atraumatic Eyes-  Sclera clear, conjunctiva pink Ears- hearing intact Oropharynx- clear Neck- supple  Lungs- Clear to ausculation bilaterally, normal work of breathing Heart- Regular rate and rhythm  GI- soft, NT, ND, + BS Extremities- no clubbing, cyanosis, or edema MS- no significant deformity or atrophy Skin- no rash or lesion Psych- euthymic mood, full affect Neuro- strength and sensation are intact  Wt Readings from Last 3 Encounters:  04/16/21 (!) 138.6 kg  03/19/21 136.1 kg  02/09/21 (!) 140.2 kg    EKG  NSR at 65 bpm, pr int 186 ms, qrs int 112 ms, qtc 474 ms   Echo-  1. Left ventricular ejection fraction, by estimation, is 60 to 65%. The  left ventricle has normal function. The left ventricle has no regional  wall motion abnormalities. There is mild asymmetric left ventricular  hypertrophy of the basal and septal  segments. Left ventricular diastolic parameters are indeterminate.   2. Right ventricular systolic function is normal. The right ventricular  size is normal.   3. Left atrial size was moderately dilated.   4. The mitral valve is abnormal. Mild mitral valve regurgitation. No  evidence of mitral stenosis.   5. Tricuspid  valve regurgitation is mild to moderate.   6. The aortic valve is tricuspid. There is moderate calcification of the  aortic valve. There is moderate thickening of the aortic valve. Aortic  valve regurgitation is not visualized. Sclerosis without stenosis.   7. The inferior vena cava is normal in size with greater than 50%  respiratory variability, suggesting right atrial pressure of 3 mmHg.  Epic records are reviewed at length today  Assessment and Plan:  1. Persistent atrial fibrillation DId well maintaining SR on dofetilide 250 mcg bid until early  fall  Cardioversion x 4 failed to convert pt He is now s/p afib ablation  He is staying in [place] for [place] for now as well as metoprolol 50 mg daily   2.  Morbid obesity Body mass index is 37.2 kg/m. He does exercise on a regular basis He has started Keto diet and has lost 6 lbs since I saw him last  Encouraged to continue effort  3.  OSA Compliant with CPAP  4.  Acute on chronic diastolic heart failure  Continue  with current diuretics   5.  S/p bioprosthetic AVR  per Dr. Percival [name]  Prior endocarditis Echo pending  10/3  6. Chronic anemia  Stable   I will request 3 month f/u ablation appointment with Dr. Lawrence Marseilles C. Shayne Deerman, [facility] [address] [place], [place] [address] [phone]

## 2021-06-19 ENCOUNTER — Other Ambulatory Visit (HOSPITAL_COMMUNITY): Payer: Self-pay | Admitting: Nurse Practitioner

## 2021-06-19 NOTE — Telephone Encounter (Signed)
This is a A-Fib clinic pt. Pt was just seen in A-Fib clinic in December 2022, pt has not seen a app or [name]. Please address for refill. thanks ?

## 2021-06-29 ENCOUNTER — Other Ambulatory Visit (HOSPITAL_COMMUNITY): Payer: Self-pay | Admitting: Nurse Practitioner

## 2021-07-09 ENCOUNTER — Encounter: Payer: Self-pay | Admitting: Internal Medicine

## 2021-07-09 ENCOUNTER — Other Ambulatory Visit: Payer: Self-pay

## 2021-07-09 ENCOUNTER — Ambulatory Visit: Payer: 59 | Admitting: Internal Medicine

## 2021-07-09 VITALS — BP 102/64 | HR 66 | Ht 76.0 in | Wt 310.8 lb

## 2021-07-09 DIAGNOSIS — D6869 Other thrombophilia: Secondary | ICD-10-CM | POA: Diagnosis not present

## 2021-07-09 DIAGNOSIS — Z952 Presence of prosthetic heart valve: Secondary | ICD-10-CM | POA: Diagnosis not present

## 2021-07-09 DIAGNOSIS — I4819 Other persistent atrial fibrillation: Secondary | ICD-10-CM

## 2021-07-09 DIAGNOSIS — I5032 Chronic diastolic (congestive) heart failure: Secondary | ICD-10-CM

## 2021-07-09 LAB — BASIC METABOLIC PANEL
BUN/Creatinine Ratio: 22 (ref 10–24)
BUN: 34 mg/dL — ABNORMAL HIGH (ref 8–27)
CO2: 28 mmol/L (ref 20–29)
Calcium: 9.5 mg/dL (ref 8.6–10.2)
Chloride: 96 mmol/L (ref 96–106)
Creatinine, Ser: 1.56 mg/dL — ABNORMAL HIGH (ref 0.76–1.27)
Glucose: 102 mg/dL — ABNORMAL HIGH (ref 70–99)
Potassium: 3.9 mmol/L (ref 3.5–5.2)
Sodium: 141 mmol/L (ref 134–144)
eGFR: 51 mL/min/{1.73_m2} — ABNORMAL LOW (ref >59)

## 2021-07-09 LAB — CBC WITH DIFFERENTIAL/PLATELET
Basophils Absolute: 0.1 10*3/uL (ref 0.0–0.2)
Basos: 1 %
EOS (ABSOLUTE): 0.1 10*3/uL (ref 0.0–0.4)
Eos: 2 %
Hematocrit: 36.9 % — ABNORMAL LOW (ref 37.5–51.0)
Hemoglobin: 12.4 g/dL — ABNORMAL LOW (ref 13.0–17.7)
Immature Grans (Abs): 0 10*3/uL (ref 0.0–0.1)
Immature Granulocytes: 0 %
Lymphocytes Absolute: 1.2 10*3/uL (ref 0.7–3.1)
Lymphs: 14 %
MCH: 30.5 pg (ref 26.6–33.0)
MCHC: 33.6 g/dL (ref 31.5–35.7)
MCV: 91 fL (ref 79–97)
Monocytes Absolute: 0.7 10*3/uL (ref 0.1–0.9)
Monocytes: 8 %
Neutrophils Absolute: 6.2 10*3/uL (ref 1.4–7.0)
Neutrophils: 75 %
Platelets: 217 10*3/uL (ref 150–450)
RBC: 4.07 x10E6/uL — ABNORMAL LOW (ref 4.14–5.80)
RDW: 13.1 % (ref 11.6–15.4)
WBC: 8.4 10*3/uL (ref 3.4–10.8)

## 2021-07-09 LAB — MAGNESIUM: Magnesium: 2.1 mg/dL (ref 1.6–2.3)

## 2021-07-09 MED ORDER — METOPROLOL SUCCINATE ER 25 MG PO TB24: ORAL_TABLET | ORAL | 3 refills | Status: DC

## 2021-07-09 MED ORDER — METOPROLOL SUCCINATE ER 25 MG PO TB24: ORAL_TABLET | ORAL | 0 refills | Status: DC

## 2021-07-09 NOTE — Progress Notes (Signed)
? ?PCP: [name], MD ?Primary Cardiologist: Dr Antoine Poche ? ?Philip [name] is a 61 y.o. male who presents today for routine electrophysiology followup.  Since his recent afib ablation, the patient reports doing very well.  he denies procedure related complications and is pleased with the results of the procedure.  Today, he denies symptoms of palpitations, chest pain, shortness of breath,  lower extremity edema, dizziness, presyncope, or syncope.  The patient is otherwise without complaint today.  ? ?Past Medical History:  ?Diagnosis Date  ? Arthritis   ? Dental crowns present   ? Diabetes mellitus   ? NIDDM  ? Diabetic foot ulcer (HCC) 08/2011  ? right foot  ? Fatty liver   ? Gastric ulcer   ? no current problems  ? GERD (gastroesophageal reflux disease)   ? Gout   ? H/O mitral valve repair 11/2003  ? 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 Hx of bacterial endocarditis 11/2003  ? Hypertension   ? Neuropathy   ? feet bilat   ? Obesity   ? Persistent atrial fibrillation (HCC)   ? Sleep apnea   ? uses CPAP nightly  ? Toe deformity 08/2011  ? right claw hallux  ? ?Past Surgical History:  ?Procedure Laterality Date  ? adenoid surgery     ? AORTIC VALVE REPLACEMENT  11/2003  ? ATRIAL FIBRILLATION ABLATION N/A 03/19/2021  ? Procedure: ATRIAL FIBRILLATION ABLATION;  Surgeon: [name], MD;  [facility];  Service: Cardiovascular;  Laterality: N/A;  ? CARDIOVERSION N/A 07/25/2017  ? Procedure: CARDIOVERSION;  Surgeon: Chilton Si, MD;  Location: [facility] ENDOSCOPY;  Service: Cardiovascular;  Laterality: N/A;  ? CARDIOVERSION N/A 09/07/2017  ? Procedure: CARDIOVERSION;  Surgeon: Chrystie Nose, MD;  Location: [facility] ENDOSCOPY;  Service: Cardiovascular;  Laterality: N/A;  ? CARDIOVERSION N/A 09/29/2017  ? Procedure: CARDIOVERSION;  Surgeon: Thurmon Fair, MD;  Location: [facility] ENDOSCOPY;  Service: Cardiovascular;  Laterality: N/A;  ? CARDIOVERSION N/A  12/26/2020  ? Procedure: CARDIOVERSION;  Surgeon: Wendall Stade, MD;  Location: [facility] ENDOSCOPY;  Service: Cardiovascular;  Laterality: N/A;  ? COLONOSCOPY WITH PROPOFOL N/A 03/30/2017  ? Procedure: COLONOSCOPY WITH PROPOFOL;  Surgeon: Benancio Deeds, MD;  Location: [facility] ENDOSCOPY;  Service: Gastroenterology;  Laterality: N/A;  ? ESOPHAGOGASTRODUODENOSCOPY (EGD) WITH PROPOFOL N/A 03/29/2017  ? Procedure: ESOPHAGOGASTRODUODENOSCOPY (EGD) WITH PROPOFOL;  Surgeon: Benancio Deeds, MD;  Location: [facility] ENDOSCOPY;  Service: Gastroenterology;  Laterality: N/A;  ? GASTRIC BYPASS  03/2004  ? GIVENS CAPSULE STUDY N/A 03/30/2017  ? Procedure: GIVENS CAPSULE STUDY;  Surgeon: Benancio Deeds, MD;  Location: [facility] ENDOSCOPY;  Service: Gastroenterology;  Laterality: N/A;  ? KNEE ARTHROSCOPY  08/09/2007 - left  ? 07/15/2003 - right  ? METATARSAL OSTEOTOMY  03/20/2010  ? right 1st MT; gastroc soleus recession  ? NASAL SINUS SURGERY    ? TENDON RELEASE  09/09/2011  ? Procedure: HEEL CORD LENGTHENING;  Surgeon: Toni Arthurs, MD;  Location: [facility];  Service: Orthopedics;  Laterality: Right;  Righ tachilles tendon lengthening  ? TOE FUSION  07/12/2005  ? left 3rd toe PIP and DIP fusion  ? TONSILLECTOMY AND ADENOIDECTOMY    ? TOTAL KNEE ARTHROPLASTY Left 03/18/2015  ? Procedure: LEFT TOTAL KNEE ARTHROPLASTY;  Surgeon: Durene Romans, MD;  Location: [facility] ORS;  Service: Orthopedics;  Laterality: Left;  ? ? ?ROS- all systems are personally reviewed and negatives except as per HPI  above ? ?Current Outpatient Medications  ?Medication Sig Dispense Refill  ? Accu-Chek FastClix Lancets MISC 2 (two) times daily. as directed    ? ACCU-CHEK GUIDE test strip 2 (two) times daily.    ? Ascorbic Acid (VITAMIN C) 1000 MG tablet Take 1,000 mg by mouth in the morning.    ? buPROPion (WELLBUTRIN XL) 150 MG 24 hr tablet Take 1 tablet by mouth daily.    ? Calcium Carb-Cholecalciferol (CALCIUM 600+D3 PO) Take 1 tablet by mouth in the morning  and at bedtime.    ? cetirizine (ZYRTEC) 10 MG tablet Take 10 mg by mouth daily.     ? clindamycin (CLEOCIN) 150 MG capsule Take 600 mg by mouth See admin instructions. Take 4 tablets by mouth 1 hour prior to seeing the dentist    ? dofetilide (TIKOSYN) 250 MCG capsule TAKE 1 CAPSULE BY MOUTH 2 TIMES DAILY. 180 capsule 2  ? esomeprazole (NEXIUM) 40 MG capsule Take 40 mg by mouth every evening.     ? ferrous sulfate 325 (65 FE) MG tablet Take 325 mg by mouth every evening.    ? furosemide (LASIX) 40 MG tablet Take 1 tablet (40 mg total) by mouth daily. 30 tablet 6  ? KLOR-CON M20 20 MEQ tablet TAKE 1 TABLET DAILY, AND 1 EXTRA TABLET ON DAYS       TAKING  METOLAZONE. 95 tablet 2  ? losartan (COZAAR) 25 MG tablet Take 25 mg by mouth every evening.     ? metolazone (ZAROXOLYN) 5 MG tablet Take 5 mg by mouth daily as needed (fluid retention (average once weekly)).    ? metoprolol succinate (TOPROL-XL) 50 MG 24 hr tablet TAKE 1 TABLET DAILY, WITH  OR IMMEDIATELY FOLLOWING A MEAL 90 tablet 3  ? nystatin-triamcinolone (MYCOLOG II) cream Apply 1 application topically 3 (three) times daily as needed (yeast/irritation).    ? sildenafil (VIAGRA) 100 MG tablet Take 100 mg by mouth as needed.    ? simvastatin (ZOCOR) 10 MG tablet Take 10 mg by mouth every evening.     ? sitaGLIPtin-metformin (JANUMET) 50-1000 MG tablet Take 1 tablet by mouth 2 (two) times daily with a meal.     ? tadalafil (CIALIS) 20 MG tablet Take 20 mg by mouth as needed.    ? triamcinolone cream (KENALOG) 0.1 % Apply 1 application topically 2 (two) times daily.    ? XARELTO 20 MG TABS tablet TAKE 1 TABLET DAILY WITH   SUPPER 90 tablet 3  ? cyclobenzaprine (FLEXERIL) 5 MG tablet Take 5 mg by mouth as needed for muscle pain.    ? magnesium oxide (MAG-OX) 400 (240 Mg) MG tablet Take 400 mg by mouth daily.    ? ?No current facility-administered medications for this visit.  ? ? ?Physical Exam: ?Vitals:  ? 07/09/21 1018  ?BP: 102/64  ?Pulse: 66  ?SpO2: 98%   ?Weight: (!) 310 lb 12.8 oz (141 kg)  ?Height: 6\' 4"  (1.93 m)  ? ? ?GEN- The patient is well appearing, alert and oriented x 3 today.   ?Head- normocephalic, atraumatic ?Eyes-  Sclera clear, conjunctiva pink ?Ears- hearing intact ?Oropharynx- clear ?Lungs- Clear to ausculation bilaterally, normal work of breathing ?Heart- Regular rate and rhythm, no murmurs, rubs or gallops, PMI not laterally displaced ?GI- soft, NT, ND, + BS ?Extremities- no clubbing, cyanosis, or edema ? ?EKG tracing ordered today is personally reviewed and shows sinus ? ?Assessment and Plan: ? ?1. Persistent atrial fibrillation ?Doing well s/p ablation ?chads2vasc  score is 3.  He is on xarelto ?continue tikosyn.  He may wish to discontinue this on return ?Check bmet, mg, and cbc  ?Reduce metoprolol to 25mg  daily x 6 weeks then 12.5mg  daily until return ?He wishes to stop metoprolol on return ? ?2. HTN ?Stable ?No change required today ? ?3. Obesity ?Body mass index is 37.83 kg/m?. ? ?4. OSA ?Uses CPAP ? ?5. Chronic diastolic dysfunction ?Improved with sinus rhythm ?No change required today ? ?6. Valvular heart disease ?Stable s/p bioprosthetic AVR ?Stable ?No change required today ? ? ?Return to see me in 3 months ? ?Hillis RangeJames Quintus Premo MD, [id number] ?07/09/2021 ?10:38 AM ? ? ? ? ?

## 2021-07-09 NOTE — Patient Instructions (Addendum)
Medication Instructions:  ?Your physician has recommended you make the following change in your medication:  ? ? Decrease your metoprolol succinate to 25 mg -  Take one tablet by mouth daily for 6 weeks and THEN ?Decrease to metoprolol succinate 25 mg-  Take 1/2 tablet (12.5 mg) by mouth daily. ? ? ?Lab Work: ?You will get lab work today:  CBC, BMP and magnesium ? ?If you have labs (blood work) drawn today and your tests are completely normal, you will receive your results only by: ?MyChart Message (if you have MyChart) OR ?A paper copy in the mail ?If you have any lab test that is abnormal or we need to change your treatment, we will call you to review the results. ? ?Testing/Procedures: ?None ordered. ? ?Follow-Up: ?At [facility], you and your health needs are our priority.  As part of our continuing mission to provide you with exceptional heart care, we have created designated Provider Care Teams.  These Care Teams include your primary Cardiologist (physician) and Advanced Practice Providers (APPs -  Physician Assistants and Nurse Practitioners) who all work together to provide you with the care you need, when you need it. ? ?Your next appointment:   ?Your physician wants you to follow-up in: 3 months with [name]. ?You will receive a reminder letter in the mail two months in advance. If you don't receive a letter, please call our office to schedule the follow-up appointment. ? ?[email or web address] ? ?

## 2021-07-20 ENCOUNTER — Telehealth: Payer: Self-pay

## 2021-07-20 MED ORDER — FUROSEMIDE 40 MG PO TABS: ORAL_TABLET | ORAL | 6 refills | Status: AC

## 2021-07-20 NOTE — Telephone Encounter (Signed)
-----   Message from [name], MD sent at 07/18/2021 10:46 PM EDT ----- ?Results reviewed.  Boneta Lucks, please inform the patient of result. ?I will route to primary care also. ? ?Stop metolazone. ?Stop lasix daily ?Repeat bmet in 10 days. ?Daily weights and use of lasix prn advised. ?

## 2021-07-22 NOTE — Telephone Encounter (Signed)
Outreach made to Pt regarding abnormal Cr. ? ?Per Pt he reported this abnormal finding to his PCP in [facility] and was discovered he had a UTI which was treated.  BMP was repeated by PCP and Cr had normalized. ? ?Pt will change furosemide to PRN and will weight himself daily and take furosemide based on weight changes. ? ?Advised would let [name] know and call back if any other changes. ? ? ?

## 2021-07-31 NOTE — Telephone Encounter (Signed)
[name] aware. ?No changes. ?

## 2021-09-07 ENCOUNTER — Other Ambulatory Visit (HOSPITAL_COMMUNITY): Payer: Self-pay

## 2021-09-07 MED ORDER — DOFETILIDE 250 MCG PO CAPS: 250.0000 ug | ORAL_CAPSULE | Freq: Two times a day (BID) | ORAL | 2 refills | Status: DC

## 2021-09-07 NOTE — Addendum Note (Signed)
Addended by: Shona Simpson on: 09/07/2021 10:11 AM   Modules accepted: Orders

## 2021-09-07 NOTE — Addendum Note (Signed)
Addended by: Shona Simpson on: 09/07/2021 10:44 AM   Modules accepted: Orders

## 2021-10-28 ENCOUNTER — Ambulatory Visit: Payer: 59 | Admitting: Internal Medicine

## 2021-10-28 ENCOUNTER — Encounter: Payer: Self-pay | Admitting: Internal Medicine

## 2021-10-28 VITALS — BP 92/62 | HR 65 | Ht 76.0 in | Wt 258.0 lb

## 2021-10-28 DIAGNOSIS — I5032 Chronic diastolic (congestive) heart failure: Secondary | ICD-10-CM | POA: Diagnosis not present

## 2021-10-28 DIAGNOSIS — I10 Essential (primary) hypertension: Secondary | ICD-10-CM | POA: Diagnosis not present

## 2021-10-28 DIAGNOSIS — Z952 Presence of prosthetic heart valve: Secondary | ICD-10-CM | POA: Diagnosis not present

## 2021-10-28 DIAGNOSIS — I4811 Longstanding persistent atrial fibrillation: Secondary | ICD-10-CM

## 2021-10-28 DIAGNOSIS — D6869 Other thrombophilia: Secondary | ICD-10-CM

## 2021-10-28 NOTE — Patient Instructions (Addendum)
Medication Instructions:  Your physician has recommended you make the following change in your medication:   STOP Taking: Metoprolol 25 mg tablet   STOP Taking: Tikosyn 250 Mcg tablet     Lab Work: None ordered. If you have labs (blood work) drawn today and your tests are completely normal, you will receive your results only by: MyChart Message (if you have MyChart) OR A paper copy in the mail If you have any lab test that is abnormal or we need to change your treatment, we will call you to review the results.  Testing/Procedures: None ordered.  Follow-Up: At [facility], you and your health needs are our priority.  As part of our continuing mission to provide you with exceptional heart care, we have created designated Provider Care Teams.  These Care Teams include your primary Cardiologist (physician) and Advanced Practice Providers (APPs -  Physician Assistants and Nurse Practitioners) who all work together to provide you with the care you need, when you need it.  Your next appointment:   Your physician wants you to follow-up in: 6 months with [facility] You may see [name] or one of the following Advanced Practice Providers on your designated Care Team:   Francis Dowse, [place] Casimiro Needle "Mardelle Matte" [place], [place] You will receive a reminder letter in the mail two months in advance. If you don't receive a letter, please call our office to schedule the follow-up appointment.   Important Information About Sugar

## 2021-10-28 NOTE — Progress Notes (Signed)
PCP: [name], MD Primary Cardiologist: Dr Antoine Poche Primary EP: Dr Graylon Gunning Philip [name] is a 61 y.o. male who presents today for routine electrophysiology followup.  Since last being seen in our clinic, the patient reports doing very well.  Today, he denies symptoms of palpitations, chest pain, shortness of breath,  lower extremity edema, dizziness, presyncope, or syncope.  The patient is otherwise without complaint today.   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   Hx of bacterial endocarditis 11/2003   Hypertension    Neuropathy    feet bilat    Obesity    Persistent atrial fibrillation (HCC)    Sleep apnea    uses CPAP nightly   Toe deformity 08/2011   right claw hallux   Past Surgical History:  Procedure Laterality Date   adenoid surgery      AORTIC VALVE REPLACEMENT  11/2003   ATRIAL FIBRILLATION ABLATION N/A 03/19/2021   Procedure: ATRIAL FIBRILLATION ABLATION;  Surgeon: [name], MD;  Location: [facility] INVASIVE CV LAB;  Service: Cardiovascular;  Laterality: N/A;   CARDIOVERSION N/A 07/25/2017   Procedure: CARDIOVERSION;  Surgeon: Chilton Si, MD;  Location: [facility] ENDOSCOPY;  Service: Cardiovascular;  Laterality: N/A;   CARDIOVERSION N/A 09/07/2017   Procedure: CARDIOVERSION;  Surgeon: Chrystie Nose, MD;  Location: [facility] ENDOSCOPY;  Service: Cardiovascular;  Laterality: N/A;   CARDIOVERSION N/A 09/29/2017   Procedure: CARDIOVERSION;  Surgeon: Thurmon Fair, MD;  Location: [facility] ENDOSCOPY;  Service: Cardiovascular;  Laterality: N/A;   CARDIOVERSION N/A 12/26/2020   Procedure: CARDIOVERSION;  Surgeon: Wendall Stade, MD;   Location: [facility] ENDOSCOPY;  Service: Cardiovascular;  Laterality: N/A;   COLONOSCOPY WITH PROPOFOL N/A 03/30/2017   Procedure: COLONOSCOPY WITH PROPOFOL;  Surgeon: Benancio Deeds, MD;  Location: [facility] ENDOSCOPY;  Service: Gastroenterology;  Laterality: N/A;   ESOPHAGOGASTRODUODENOSCOPY (EGD) WITH PROPOFOL N/A 03/29/2017   Procedure: ESOPHAGOGASTRODUODENOSCOPY (EGD) WITH PROPOFOL;  Surgeon: Benancio Deeds, MD;  Location: [facility] ENDOSCOPY;  Service: Gastroenterology;  Laterality: N/A;   GASTRIC BYPASS  03/2004   GIVENS CAPSULE STUDY N/A 03/30/2017   Procedure: GIVENS CAPSULE STUDY;  Surgeon: Benancio Deeds, MD;  Location: [facility] ENDOSCOPY;  Service: Gastroenterology;  Laterality: N/A;   KNEE ARTHROSCOPY  08/09/2007 - left   07/15/2003 - right   METATARSAL OSTEOTOMY  03/20/2010   right 1st MT; gastroc soleus recession   NASAL SINUS SURGERY     TENDON RELEASE  09/09/2011   Procedure: HEEL CORD LENGTHENING;  Surgeon: Toni Arthurs, MD;  Location: [facility];  Service: Orthopedics;  Laterality: Right;  Righ tachilles tendon lengthening   TOE FUSION  07/12/2005   left 3rd toe PIP and DIP fusion   TONSILLECTOMY AND ADENOIDECTOMY     TOTAL KNEE ARTHROPLASTY Left 03/18/2015   Procedure: LEFT TOTAL KNEE ARTHROPLASTY;  Surgeon: Durene Romans, MD;  Location: [facility] ORS;  Service: Orthopedics;  Laterality: Left;    ROS- all systems are reviewed and negatives except as per HPI above  Current Outpatient Medications  Medication Sig Dispense Refill  Accu-Chek FastClix Lancets MISC 2 (two) times daily. as directed     ACCU-CHEK GUIDE test strip 2 (two) times daily.     Ascorbic Acid (VITAMIN C) 1000 MG tablet Take 1,000 mg by mouth in the morning.     Calcium Carb-Cholecalciferol (CALCIUM 600+D3 PO) Take 1 tablet by mouth in the morning and at bedtime.     cetirizine (ZYRTEC) 10 MG tablet Take 10 mg by mouth daily.      clindamycin (CLEOCIN) 150 MG capsule Take 600 mg by mouth See admin  instructions. Take 4 tablets by mouth 1 hour prior to seeing the dentist     dofetilide (TIKOSYN) 250 MCG capsule Take 1 capsule (250 mcg total) by mouth 2 (two) times daily. 180 capsule 2   esomeprazole (NEXIUM) 40 MG capsule Take 40 mg by mouth every evening.      ferrous sulfate 325 (65 FE) MG tablet Take 325 mg by mouth every evening.     furosemide (LASIX) 40 MG tablet Take daily as needed for edema or shortness of breath 30 tablet 6   KLOR-CON M20 20 MEQ tablet TAKE 1 TABLET DAILY, AND 1 EXTRA TABLET ON DAYS       TAKING  METOLAZONE. 95 tablet 2   losartan (COZAAR) 25 MG tablet Take 25 mg by mouth every evening.      magnesium oxide (MAG-OX) 400 (240 Mg) MG tablet Take 400 mg by mouth daily.     metoprolol succinate (TOPROL XL) 25 MG 24 hr tablet Take 1 tablet (25 mg total) by mouth daily for 42 days, THEN 0.5 tablets (12.5 mg total) daily. 222 tablet 3   nystatin-triamcinolone (MYCOLOG II) cream Apply 1 application topically 3 (three) times daily as needed (yeast/irritation).     sildenafil (VIAGRA) 100 MG tablet Take 100 mg by mouth as needed.     simvastatin (ZOCOR) 10 MG tablet Take 10 mg by mouth every evening.      sitaGLIPtin-metformin (JANUMET) 50-1000 MG tablet Take 1 tablet by mouth 2 (two) times daily with a meal.      tadalafil (CIALIS) 20 MG tablet Take 20 mg by mouth as needed.     triamcinolone cream (KENALOG) 0.1 % Apply 1 application topically 2 (two) times daily.     XARELTO 20 MG TABS tablet TAKE 1 TABLET DAILY WITH   SUPPER 90 tablet 3   No current facility-administered medications for this visit.    Physical Exam: Vitals:   10/28/21 1012  BP: 92/62  Pulse: 65  SpO2: 99%  Weight: 258 lb (117 kg)  Height: 6\' 4"  (1.93 m)    GEN- The patient is well appearing, alert and oriented x 3 today.   Head- normocephalic, atraumatic Eyes-  Sclera clear, conjunctiva pink Ears- hearing intact Oropharynx- clear Lungs-  normal work of breathing Heart- Regular rate and  rhythm  GI- soft  Extremities- no clubbing, cyanosis, or edema  Wt Readings from Last 3 Encounters:  10/28/21 258 lb (117 kg)  07/09/21 (!) 310 lb 12.8 oz (141 kg)  04/16/21 (!) 305 lb 9.6 oz (138.6 kg)    EKG tracing ordered today is personally reviewed and shows sinus rhythm 65 bpm  Assessment and Plan:  Persistent afib Well controlled post ablation He wishes to stop tikosyn today He is on xarelto for chads2vasc score of 3 stop metoprolol also If AF does not return, we may be able to consider stopping xarelto on return to AF clinic  2. HTN Stable No  change required today  3. OSA Uses CPAP  4. Chronic diastolic dysfunction Stable No change required today  5. Obesity He has lost over 50 lbs since his last visit!  6. Valvular heart disease Stable s/p bioprosthetic AVR  Risks, benefits and potential toxicities for medications prescribed and/or refilled reviewed with patient today.   Return in 6 months to AF clinic  [name] MD, [facility] 10/28/2021 10:48 AM

## 2021-12-27 ENCOUNTER — Encounter: Payer: Self-pay | Admitting: Internal Medicine

## 2022-01-07 ENCOUNTER — Telehealth: Payer: Self-pay | Admitting: Cardiology

## 2022-01-07 NOTE — Telephone Encounter (Signed)
   Pre-operative Risk Assessment    Patient Name: KARAN RAMNAUTH  DOB: 1960/08/28 MRN: [id number]      Request for Surgical Clearance    Procedure:   Total replacement or rotater cuff repair depending on the MRI  Date of Surgery:  Clearance TBD                                 Surgeon:  Dr. Earl Lagos  Surgeon's Group or Practice Name:  [facility] in [place], [place] Phone number:  [phone] Fax number:  [phone]   Type of Clearance Requested:   - Medical    Type of Anesthesia:  Not Indicated   Additional requests/questions:      SignedMilbert Coulter   01/07/2022, 9:20 AM

## 2022-01-07 NOTE — Telephone Encounter (Signed)
Call placed to pt regarding surgical clearance and the need for a tele visit.  Left pt a message to call back and ask for the preop team. 

## 2022-01-07 NOTE — Telephone Encounter (Signed)
   Name: Philip [name]  DOB: 06/10/1960  MRN: [id number]  Primary Cardiologist: Minus Breeding, MD  Chart reviewed as part of pre-operative protocol coverage. Because of Philip [name]'s past medical history and time since last visit, he will require a follow-up telephone visit in order to better assess preoperative cardiovascular risk.  Pre-op covering staff: - Please schedule appointment and call patient to inform them. If patient already had an upcoming appointment within acceptable timeframe, please add "pre-op clearance" to the appointment notes so provider is aware. - Please contact requesting surgeon's office via preferred method (i.e, phone, fax) to inform them of need for appointment prior to surgery.  No medications are indicated as needing held  Philip [name], [place]  01/07/2022, 1:36 PM

## 2022-01-11 ENCOUNTER — Telehealth: Payer: Self-pay | Admitting: *Deleted

## 2022-01-11 NOTE — Telephone Encounter (Signed)
Pt agreeable to plan of care for tele pre op appt. Med rec and consent are done.      Patient Consent for Virtual Visit        Philip [name] has provided verbal consent on 01/11/2022 for a virtual visit (video or telephone).   CONSENT FOR VIRTUAL VISIT FOR:  Philip [name]  By participating in this virtual visit I agree to the following:  I hereby voluntarily request, consent and authorize [facility] and its employed or contracted physicians, physician assistants, nurse practitioners or other licensed health care professionals (the Practitioner), to provide me with telemedicine health care services (the "Services") as deemed necessary by the treating Practitioner. I acknowledge and consent to receive the Services by the Practitioner via telemedicine. I understand that the telemedicine visit will involve communicating with the Practitioner through live audiovisual communication technology and the disclosure of certain medical information by electronic transmission. I acknowledge that I have been given the opportunity to request an in-person assessment or other available alternative prior to the telemedicine visit and am voluntarily participating in the telemedicine visit.  I understand that I have the right to withhold or withdraw my consent to the use of telemedicine in the course of my care at any time, without affecting my right to future care or treatment, and that the Practitioner or I may terminate the telemedicine visit at any time. I understand that I have the right to inspect all information obtained and/or recorded in the course of the telemedicine visit and may receive copies of available information for a reasonable fee.  I understand that some of the potential risks of receiving the Services via telemedicine include:  Delay or interruption in medical evaluation due to technological equipment failure or disruption; Information transmitted may not be sufficient (e.g. poor resolution  of images) to allow for appropriate medical decision making by the Practitioner; and/or  In rare instances, security protocols could fail, causing a breach of personal health information.  Furthermore, I acknowledge that it is my responsibility to provide information about my medical history, conditions and care that is complete and accurate to the best of my ability. I acknowledge that Practitioner's advice, recommendations, and/or decision may be based on factors not within their control, such as incomplete or inaccurate data provided by me or distortions of diagnostic images or specimens that may result from electronic transmissions. I understand that the practice of medicine is not an exact science and that Practitioner makes no warranties or guarantees regarding treatment outcomes. I acknowledge that a copy of this consent can be made available to me via my patient portal ([facility]), or I can request a printed copy by calling the office of [facility].    I understand that my insurance will be billed for this visit.   I have read or had this consent read to me. I understand the contents of this consent, which adequately explains the benefits and risks of the Services being provided via telemedicine.  I have been provided ample opportunity to ask questions regarding this consent and the Services and have had my questions answered to my satisfaction. I give my informed consent for the services to be provided through the use of telemedicine in my medical care

## 2022-01-11 NOTE — Telephone Encounter (Signed)
Pt agreeable to plan of care for tele pre op appt. Med rec and consent are done. 

## 2022-01-14 ENCOUNTER — Telehealth: Payer: 59

## 2022-01-18 ENCOUNTER — Encounter: Payer: Self-pay | Admitting: Cardiovascular Disease

## 2022-01-18 ENCOUNTER — Ambulatory Visit: Payer: 59 | Attending: Cardiovascular Disease | Admitting: Cardiovascular Disease

## 2022-01-18 ENCOUNTER — Other Ambulatory Visit: Payer: Self-pay

## 2022-01-18 VITALS — BP 118/78 | HR 104 | Ht 76.0 in | Wt 270.0 lb

## 2022-01-18 DIAGNOSIS — I4811 Longstanding persistent atrial fibrillation: Secondary | ICD-10-CM | POA: Diagnosis not present

## 2022-01-18 MED ORDER — METOPROLOL SUCCINATE ER 25 MG PO TB24: 50.0000 mg | ORAL_TABLET | Freq: Every day | ORAL | 3 refills | Status: DC

## 2022-01-18 NOTE — Progress Notes (Signed)
Virtual Visit via Telephone Note   Because of Philip [name]'s co-morbid illnesses, he is at least at moderate risk for complications without adequate follow up.  This format is felt to be most appropriate for this patient at this time.  The patient did not have access to video technology/had technical difficulties with video requiring transitioning to audio format only (telephone).  All issues noted in this document were discussed and addressed.  No physical exam could be performed with this format.  Please refer to the patient's chart for his consent to telehealth for [facility].  Evaluation Performed:  Preoperative cardiovascular risk assessment _____________   Date:  01/18/2022   Patient ID:  Philip [name], DOB 1961-04-08, MRN [id number] Patient Location:  Home Provider location:   Office  Primary Care Provider:  [name], MD Primary Cardiologist:  Rollene Rotunda, MD  Chief Complaint / Patient Profile   61 y.o. 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   Hx of bacterial endocarditis 11/2003   Hypertension    Neuropathy    feet bilat    Obesity    Persistent atrial fibrillation (HCC)    Sleep apnea    uses CPAP nightly   Toe deformity 08/2011   right claw hallux   Past  Surgical History:  Procedure Laterality Date   adenoid surgery      AORTIC VALVE REPLACEMENT  11/2003   ATRIAL FIBRILLATION ABLATION N/A 03/19/2021   Procedure: ATRIAL FIBRILLATION ABLATION;  Surgeon: [name], MD;  [facility]: [facility] INVASIVE CV LAB;  Service: Cardiovascular;  Laterality: N/A;   CARDIOVERSION N/A 07/25/2017   Procedure: CARDIOVERSION;  Surgeon: Chilton Si, MD;  Location: [facility] ENDOSCOPY;  Service: Cardiovascular;  Laterality: N/A;   CARDIOVERSION N/A 09/07/2017   Procedure: CARDIOVERSION;  Surgeon: Chrystie Nose, MD;  Location: [facility] ENDOSCOPY;  Service: Cardiovascular;  Laterality: N/A;   CARDIOVERSION N/A 09/29/2017   Procedure: CARDIOVERSION;  Surgeon: Thurmon Fair, MD;  Location: [facility] ENDOSCOPY;  Service: Cardiovascular;  Laterality: N/A;   CARDIOVERSION N/A 12/26/2020   Procedure: CARDIOVERSION;  Surgeon: Wendall Stade, MD;  Location: [facility] ENDOSCOPY;  Service: Cardiovascular;  Laterality: N/A;   COLONOSCOPY WITH PROPOFOL N/A 03/30/2017   Procedure: COLONOSCOPY WITH PROPOFOL;  Surgeon: Benancio Deeds, MD;  Location: [facility] ENDOSCOPY;  Service: Gastroenterology;  Laterality: N/A;   ESOPHAGOGASTRODUODENOSCOPY (EGD) WITH PROPOFOL N/A 03/29/2017   Procedure: ESOPHAGOGASTRODUODENOSCOPY (EGD) WITH PROPOFOL;  Surgeon: Benancio Deeds, MD;  Location: [facility] ENDOSCOPY;  Service: Gastroenterology;  Laterality: N/A;   GASTRIC BYPASS  03/2004   GIVENS CAPSULE STUDY N/A 03/30/2017   Procedure: GIVENS CAPSULE STUDY;  Surgeon: Benancio Deeds, MD;  Location: [facility] ENDOSCOPY;  Service: Gastroenterology;  Laterality: N/A;   KNEE ARTHROSCOPY  08/09/2007 -  left   07/15/2003 - right   METATARSAL OSTEOTOMY  03/20/2010   right 1st MT; gastroc soleus recession   NASAL SINUS SURGERY     TENDON RELEASE  09/09/2011   Procedure: HEEL CORD LENGTHENING;  Surgeon: Toni Arthurs, MD;  Location: [facility];  Service: Orthopedics;  Laterality: Right;  Righ tachilles tendon lengthening    TOE FUSION  07/12/2005   left 3rd toe PIP and DIP fusion   TONSILLECTOMY AND ADENOIDECTOMY     TOTAL KNEE ARTHROPLASTY Left 03/18/2015   Procedure: LEFT TOTAL KNEE ARTHROPLASTY;  Surgeon: Durene Romans, MD;  Location: [facility] ORS;  Service: Orthopedics;  Laterality: Left;    Allergies  Allergies  Allergen Reactions   Penicillins Hives, Swelling and Other (See Comments)    NO REACTION LISTED Has patient had a PCN reaction causing immediate rash, facial/tongue/throat swelling, SOB or lightheadedness with hypotension: Yes Has patient had a PCN reaction causing severe rash involving mucus membranes or skin necrosis: No Has patient had a PCN reaction that required hospitalization No Has patient had a PCN reaction occurring within the last 10 years: No If all of the above answers are "NO", then may proceed with Cephalosporin use.  Has taken Keflex w/o issue        History of Present Illness    Philip [name] is a 61 y.o. male who presents via audio/video conferencing for a telehealth visit today.  Pt was last seen in cardiology clinic on 01/18/22 by [name].  At that time ROHEN KIMES was doing well.  The patient is now pending procedure as outlined above. Since his last visit, he denies chest pain, shortness of breath, lower extremity edema, fatigue, palpitations, melena, hematuria, hemoptysis, diaphoresis, weakness, presyncope, syncope, orthopnea, and PND. He established care with [name] on 01/18/22 and will pursue dofetilide treatment for return of a fib. He has remained on Xarelto.   Home Medications    Prior to Admission medications   Medication Sig Start Date End Date Taking? Authorizing Provider  Accu-Chek FastClix Lancets MISC 2 (two) times daily. as directed 06/13/19   [provider]  ACCU-CHEK GUIDE test strip 2 (two) times daily. 06/06/19   [provider]  Ascorbic Acid (VITAMIN C) 1000 MG tablet Take 1,000 mg by mouth in the morning.    [provider]   Calcium Carb-Cholecalciferol (CALCIUM 600+D3 PO) Take 1 tablet by mouth in the morning and at bedtime.    [provider]  cetirizine (ZYRTEC) 10 MG tablet Take 10 mg by mouth daily.     [provider]  clindamycin (CLEOCIN) 150 MG capsule Take 600 mg by mouth See admin instructions. Take 4 tablets by mouth 1 hour prior to seeing the dentist 08/12/19   [provider]  esomeprazole (NEXIUM) 40 MG capsule Take 40 mg by mouth every evening.     [provider]  ferrous sulfate 325 (65 FE) MG tablet Take 325 mg by mouth every evening.    [provider]  furosemide (LASIX) 40 MG tablet Take daily as needed for edema or shortness of breath 07/20/21   Allred, Fayrene Fearing, MD  KLOR-CON M20 20 MEQ tablet TAKE 1 TABLET DAILY, AND 1 EXTRA TABLET ON DAYS       TAKING  METOLAZONE. 06/19/21   Newman Nip, NP  losartan (COZAAR) 25 MG tablet Take 25 mg by mouth every evening.  04/26/17   [provider]  magnesium oxide (MAG-OX) 400 (240 Mg)  MG tablet Take 400 mg by mouth daily. 05/28/21   [provider]  metoprolol succinate (TOPROL XL) 25 MG 24 hr tablet Take 2 tablets (50 mg total) by mouth daily. 01/18/22   Mealor, Yetta Barre, MD  nystatin-triamcinolone (MYCOLOG II) cream Apply 1 application topically 3 (three) times daily as needed (yeast/irritation). 11/24/20   [provider]  sildenafil (VIAGRA) 100 MG tablet Take 100 mg by mouth as needed. 04/03/21   [provider]  simvastatin (ZOCOR) 10 MG tablet Take 10 mg by mouth every evening.  03/18/17   [provider]  sitaGLIPtin-metformin (JANUMET) 50-1000 MG tablet Take 1 tablet by mouth 2 (two) times daily with a meal.  03/18/17   [provider]  tadalafil (CIALIS) 20 MG tablet Take 20 mg by mouth as needed. 04/03/21   [provider]  XARELTO 20 MG TABS tablet TAKE 1 TABLET DAILY WITH   SUPPER 09/29/20   Sherran Needs, NP    Physical Exam    Vital  Signs:  JOHNCHARLES FUSSELMAN does not have vital signs available for review today.  Given telephonic nature of communication, physical exam is limited. AAOx3. NAD. Normal affect.  Speech and respirations are unlabored.  Accessory Clinical Findings    None  Assessment & Plan    1.  Preoperative Cardiovascular Risk Assessment: The patient is doing well from a cardiac perspective. Therefore, based on ACC/AHA guidelines, the patient would be at acceptable risk for the planned procedure without further cardiovascular testing. According to the Revised Cardiac Risk Index (RCRI), his Perioperative Risk of Major Cardiac Event is (%): 0.4 His Functional Capacity in METs is: 7.59 according to the Duke Activity Status Index (DASI).  The patient was advised that if he develops new symptoms prior to surgery to contact our office to arrange for a follow-up visit, and he verbalized understanding.  No request to hold Xarelto. Patient states he was not advised to hold for upcoming procedure. He will contact our office if he receives notification to hold Xarelto.   A copy of this note will be routed to requesting surgeon.  Time:   Today, I have spent 8 minutes with the patient with telehealth technology discussing medical history, symptoms, and management plan.     Emmaline Life, NP-C  01/19/2022, 9:32 AM [address] Office [phone] Fax [phone]

## 2022-01-18 NOTE — Patient Instructions (Addendum)
Medication Instructions:  Your physician has recommended you make the following change in your medication:  1) INCREASE metoprolol to 50 mg daily   *If you need a refill on your cardiac medications before your next appointment, please call your pharmacy*  Follow-Up: At [facility], you and your health needs are our priority.  As part of our continuing mission to provide you with exceptional heart care, we have created designated Provider Care Teams.  These Care Teams include your primary Cardiologist (physician) and Advanced Practice Providers (APPs -  Physician Assistants and Nurse Practitioners) who all work together to provide you with the care you need, when you need it.  Your next appointment:   Stacy at the atrial fibrillation clinic will contact you for admission  Other Instructions  Tikosyn (Dofetilide) Hospital Admission   Prior to day of admission:  Check with drug insurance company for cost of drug to ensure affordability --- Dofetilide 500 mcg twice a day.  GoodRx is an option if insurance copay is unaffordable.   No Benadryl is allowed 3 days prior to admission.   Please ensure no missed doses of your anticoagulation (blood thinner) for 3 weeks prior to admission. If a dose is missed please notify our office immediately.   A pharmacist will review all your medications for potential interactions with Tikosyn. If any medication changes are needed prior to admission we will be in touch with you.   If any new medications are started AFTER your admission date is set with Nurse, adult. Please notify our office immediately so your medication list can be updated and reviewed by our pharmacist again.  On day of admission:  Tikosyn initiation requires a 3 night/4 day hospital stay with constant telemetry monitoring. You will have an EKG after each dose of Tikosyn as well as daily lab draws.   If the drug does not convert you to normal rhythm a cardioversion after the 4th dose  of Tikosyn.   Afib Clinic office visit on the morning of admission is needed for preliminary labs/ekg.   Time of admission is dependent on bed availability in the hospital. In some instances, you will be sent home until bed is available. Rarely admission can be delayed to the following day if hospital census prevents available beds.   You may bring personal belongings/clothing with you to the hospital. Please leave your suitcase in the car until you arrive in admissions.   Questions please call our office at [phone]

## 2022-01-18 NOTE — Progress Notes (Signed)
Cardiology Office Note:    Date:  01/18/2022   ID:  Margret Chance, DOB 1960/05/31, MRN [id number]  PCP:  Robyne Peers, MD   [facility] Cardiologist:  Minus Breeding, MD Electrophysiologist:  Melida Quitter, MD     Referring MD: Robyne Peers, MD   Chief complaint: follow-up for AF  History of Present Illness:    VOLVI KAPSNER is a 61 y.o. male with a hx of bioprosthetic aortic valve replacement in 2005, gastric bypass, GI bleed with possible AVMs on capsule endoscopy who presents for follow-up regarding atrial fibrillation.  He had an aortic valve replacement in 2005 with a bioprosthetic valve. He has a history of AF and was managed with Tikosyn for a while. He has had multiple cardioversions detailed below. He eventually underwent ablation in Dec 2022 with WACA and posterior wall isolation.  He had recurrence of AF about a month after discontinuing Tikosyn. He was in [place] at the time. He was cardioverted in the local ER but AF recurred about 12 hours later.  Arrhythmia History: Cardioversion in [facility] 12/28/2021 Tikosyn discontinued 10/2021 AF ablation 03/19/2021: WACA and posterior wall box Cardioversion 12/26/2020 Cardioversion 09/29/2017 Cardioversion 09/07/2017 Cardioversion 07/25/2017     Past Medical History:  Diagnosis Date   Arthritis    Dental crowns present    Diabetes mellitus    NIDDM   Diabetic foot ulcer ([place]) 08/2011   right foot   Fatty liver    Gastric ulcer    no current problems   GERD (gastroesophageal reflux disease)    Gout    H/O mitral valve repair 11/2003   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   Hx of bacterial endocarditis 11/2003   Hypertension    Neuropathy    feet bilat    Obesity    Persistent atrial fibrillation ([place])    Sleep apnea    uses CPAP nightly   Toe deformity 08/2011   right claw hallux    Past Surgical History:  Procedure Laterality Date    adenoid surgery      AORTIC VALVE REPLACEMENT  11/2003   ATRIAL FIBRILLATION ABLATION N/A 03/19/2021   Procedure: ATRIAL FIBRILLATION ABLATION;  Surgeon: Thompson Grayer, MD;  Location: [place] CV LAB;  Service: Cardiovascular;  Laterality: N/A;   CARDIOVERSION N/A 07/25/2017   Procedure: CARDIOVERSION;  Surgeon: Skeet Latch, MD;  Location: [place];  Service: Cardiovascular;  Laterality: N/A;   CARDIOVERSION N/A 09/07/2017   Procedure: CARDIOVERSION;  Surgeon: Pixie Casino, MD;  Location: [facility] ENDOSCOPY;  Service: Cardiovascular;  Laterality: N/A;   CARDIOVERSION N/A 09/29/2017   Procedure: CARDIOVERSION;  Surgeon: Sanda Klein, MD;  Location: [place] ENDOSCOPY;  Service: Cardiovascular;  Laterality: N/A;   CARDIOVERSION N/A 12/26/2020   Procedure: CARDIOVERSION;  Surgeon: Josue Hector, MD;  Location: [facility] ENDOSCOPY;  Service: Cardiovascular;  Laterality: N/A;   COLONOSCOPY WITH PROPOFOL N/A 03/30/2017   Procedure: COLONOSCOPY WITH PROPOFOL;  Surgeon: Yetta Flock, MD;  Location: [facility] ENDOSCOPY;  Service: Gastroenterology;  Laterality: N/A;   ESOPHAGOGASTRODUODENOSCOPY (EGD) WITH PROPOFOL N/A 03/29/2017   Procedure: ESOPHAGOGASTRODUODENOSCOPY (EGD) WITH PROPOFOL;  Surgeon: Yetta Flock, MD;  Location: [facility] ENDOSCOPY;  Service: Gastroenterology;  Laterality: N/A;   GASTRIC BYPASS  03/2004   GIVENS CAPSULE STUDY N/A 03/30/2017   Procedure: GIVENS CAPSULE STUDY;  Surgeon: Yetta Flock, MD;  Location: [facility] ENDOSCOPY;  Service: Gastroenterology;  Laterality: N/A;  KNEE ARTHROSCOPY  08/09/2007 - left   07/15/2003 - right   METATARSAL OSTEOTOMY  03/20/2010   right 1st MT; gastroc soleus recession   NASAL SINUS SURGERY     TENDON RELEASE  09/09/2011   Procedure: HEEL CORD LENGTHENING;  Surgeon: Wylene Simmer, MD;  Location: [place];  Service: Orthopedics;  Laterality: Right;  Righ tachilles tendon lengthening   TOE FUSION  07/12/2005   left 3rd toe PIP and  DIP fusion   TONSILLECTOMY AND ADENOIDECTOMY     TOTAL KNEE ARTHROPLASTY Left 03/18/2015   Procedure: LEFT TOTAL KNEE ARTHROPLASTY;  Surgeon: Paralee Cancel, MD;  Location: [facility] ORS;  Service: Orthopedics;  Laterality: Left;    Current Medications: No outpatient medications have been marked as taking for the 01/18/22 encounter (Appointment) with Gyasi Hazzard, Yetta Barre, MD.     Allergies:   Penicillins   Social History   Socioeconomic History   Marital status: Widowed    Spouse name: Not on file   Number of children: Not on file   Years of education: Not on file   Highest education level: Not on file  Occupational History   Not on file  Tobacco Use   Smoking status: Former    Packs/day: 1.00    Years: 2.00    Total pack years: 2.00    Types: Cigars, Pipe, Cigarettes    Quit date: 01/31/2011    Years since quitting: 10.9   Smokeless tobacco: Never   Tobacco comments:    Former smoker (01/01/2021)  Vaping Use   Vaping Use: Never used  Substance and Sexual Activity   Alcohol use: Not Currently    Alcohol/week: 0.0 standard drinks of alcohol   Drug use: No   Sexual activity: Not on file  Other Topics Concern   Not on file  Social History Narrative   Not on file   Social Determinants of Health   Financial Resource Strain: Not on file  Food Insecurity: Not on file  Transportation Needs: Not on file  Physical Activity: Not on file  Stress: Not on file  Social Connections: Not on file     Family History: The patient's family history includes Alzheimer's disease in his mother; Colon cancer in his unknown relative; Diabetes in his father.  ROS:   Please see the history of present illness.     All other systems reviewed and are negative.  EKGs/Labs/Other Studies Reviewed:    The following studies were reviewed today:  TTE 01/29/2022: 1. Left ventricular ejection fraction, by visual estimation, is 60 to  65%. The left ventricle has normal function. Normal left  ventricular size.  There is no left ventricular hypertrophy.   2. Left ventricular diastolic Doppler parameters are consistent with  pseudonormalization pattern of LV diastolic filling.   3. Global right ventricle has normal systolic function.The right  ventricular size is mildly enlarged. No increase in right ventricular wall  thickness.   4. Left atrial size was moderately dilated.   5. Right atrial size was normal.   6. The mitral valve has been repaired/replaced. Mild mitral valve  regurgitation. No evidence of mitral stenosis.   7. Mitral valve annuloplasty repair. Mean gradient 66mmHg.   8. The tricuspid valve is normal in structure. Tricuspid valve  regurgitation is mild.   9. Aortic valve mean gradient measures 5.0 mmHg.  10. Aortic valve peak gradient measures 10.3 mmHg.  11. Aortic valve regurgitation was not visualized by color flow Doppler.  Structurally normal aortic valve,  with no evidence of sclerosis or  stenosis.  12. Bioprosthetic aortic valve.  13. The pulmonic valve was normal in structure. Pulmonic valve  regurgitation is not visualized by color flow Doppler.  14. Normal pulmonary artery systolic pressure.  15. The inferior vena cava is normal in size with greater than 50%  respiratory variability, suggesting right atrial pressure of 3 mmHg.   EKG:  EKG is ordered today 01/18/2022, shows atrial fibrillation with RVR.    Orders placed or performed in visit on 12/28/21   EKG 12-Lead     Recent Labs: 02/27/2021: TSH 2.960 07/09/2021: BUN 34; Creatinine, Ser 1.56; Hemoglobin 12.4; Magnesium 2.1; Platelets 217; Potassium 3.9; Sodium 141         Physical Exam:    VS:  There were no vitals taken for this visit.    Wt Readings from Last 3 Encounters:  10/28/21 258 lb (117 kg)  07/09/21 (!) 310 lb 12.8 oz (141 kg)  04/16/21 (!) 305 lb 9.6 oz (138.6 kg)     GEN:  Well nourished, well developed in no acute distress CARDIAC: Irregular, rapid, no murmurs, rubs,  gallops RESPIRATORY:  Normal work of breathing MUSCULOSKELETAL: + edema, appears chronic    ASSESSMENT & PLAN:    In order of problems listed above:  Atrial fibrillation: persistent. S/p ablation Dec 2022. Now with recurrence of persistent AF. Will restart Tikosyn. He is aware of the risks. He has tolerated a dose of 250 md well in the past. Continue xarelto. High risk medication: resuming Tikosyn. Will admit for observation Long term use of anticoagulation: continue xarelto indefinitely History of bioprosthetic aortic valve: follows with Dr. Percival Spanish        Medication Adjustments/Labs and Tests Ordered: Current medicines are reviewed at length with the patient today.  Concerns regarding medicines are outlined above.  No orders of the defined types were placed in this encounter.  No orders of the defined types were placed in this encounter.    Signed, Melida Quitter, MD  01/18/2022 2:02 PM    [place]

## 2022-01-19 ENCOUNTER — Ambulatory Visit: Payer: 59 | Attending: Cardiology | Admitting: Nurse Practitioner

## 2022-01-19 ENCOUNTER — Telehealth: Payer: Self-pay | Admitting: Pharmacist

## 2022-01-19 ENCOUNTER — Encounter: Payer: Self-pay | Admitting: Nurse Practitioner

## 2022-01-19 DIAGNOSIS — Z01810 Encounter for preprocedural cardiovascular examination: Secondary | ICD-10-CM | POA: Diagnosis not present

## 2022-01-19 NOTE — Telephone Encounter (Signed)
Medication list reviewed in anticipation of upcoming Tikosyn initiation. Patient is not taking any contraindicated or QTc prolonging medications.   Close monitoring of electrolytes prior to and during treatment with Tikosyn should be preformed especially since patient is on furosemide.  Patient is anticoagulated on Xarelto on the appropriate dose. Please ensure that patient has not missed any anticoagulation doses in the 3 weeks prior to Tikosyn initiation.   Patient will need to be counseled to avoid use of Benadryl while on Tikosyn and in the 2-3 days prior to Tikosyn initiation.

## 2022-01-22 ENCOUNTER — Encounter (HOSPITAL_COMMUNITY): Payer: Self-pay

## 2022-01-22 ENCOUNTER — Telehealth (HOSPITAL_COMMUNITY): Payer: Self-pay | Admitting: *Deleted

## 2022-01-22 NOTE — Telephone Encounter (Signed)
Inpatient hospital stay on 10/10 initial admit [id number] no preauth required ref numbner [id number]

## 2022-01-26 ENCOUNTER — Inpatient Hospital Stay (HOSPITAL_COMMUNITY)
Admission: AD | Admit: 2022-01-26 | Discharge: 2022-01-29 | DRG: 309 | Disposition: A | Discharge status: Home / Self Care | Payer: 59 | Attending: Cardiovascular Disease | Admitting: Cardiovascular Disease

## 2022-01-26 ENCOUNTER — Ambulatory Visit (HOSPITAL_COMMUNITY)
Admission: RE | Admit: 2022-01-26 | Discharge: 2022-01-26 | Disposition: A | Discharge status: Home / Self Care | Payer: 59 | Source: Ambulatory Visit | Attending: Nurse Practitioner | Admitting: Nurse Practitioner

## 2022-01-26 ENCOUNTER — Telehealth (HOSPITAL_COMMUNITY): Payer: Self-pay | Admitting: Pharmacy Technician

## 2022-01-26 ENCOUNTER — Other Ambulatory Visit (HOSPITAL_COMMUNITY): Payer: Self-pay

## 2022-01-26 ENCOUNTER — Encounter (HOSPITAL_COMMUNITY): Payer: Self-pay | Admitting: Nurse Practitioner

## 2022-01-26 ENCOUNTER — Other Ambulatory Visit: Payer: Self-pay

## 2022-01-26 ENCOUNTER — Encounter (HOSPITAL_COMMUNITY): Payer: Self-pay | Admitting: Cardiology

## 2022-01-26 VITALS — BP 136/90 | HR 91 | Ht 76.0 in | Wt 274.0 lb

## 2022-01-26 DIAGNOSIS — I4891 Unspecified atrial fibrillation: Secondary | ICD-10-CM | POA: Diagnosis not present

## 2022-01-26 DIAGNOSIS — I5032 Chronic diastolic (congestive) heart failure: Secondary | ICD-10-CM | POA: Diagnosis present

## 2022-01-26 DIAGNOSIS — Z880 Allergy status to penicillin: Secondary | ICD-10-CM | POA: Diagnosis not present

## 2022-01-26 DIAGNOSIS — I4819 Other persistent atrial fibrillation: Secondary | ICD-10-CM

## 2022-01-26 DIAGNOSIS — K219 Gastro-esophageal reflux disease without esophagitis: Secondary | ICD-10-CM | POA: Diagnosis present

## 2022-01-26 DIAGNOSIS — Z820 Family history of epilepsy and other diseases of the nervous system: Secondary | ICD-10-CM

## 2022-01-26 DIAGNOSIS — E785 Hyperlipidemia, unspecified: Secondary | ICD-10-CM | POA: Diagnosis present

## 2022-01-26 DIAGNOSIS — E119 Type 2 diabetes mellitus without complications: Secondary | ICD-10-CM | POA: Diagnosis not present

## 2022-01-26 DIAGNOSIS — K760 Fatty (change of) liver, not elsewhere classified: Secondary | ICD-10-CM | POA: Diagnosis present

## 2022-01-26 DIAGNOSIS — E1142 Type 2 diabetes mellitus with diabetic polyneuropathy: Secondary | ICD-10-CM | POA: Diagnosis present

## 2022-01-26 DIAGNOSIS — Z833 Family history of diabetes mellitus: Secondary | ICD-10-CM

## 2022-01-26 DIAGNOSIS — G4733 Obstructive sleep apnea (adult) (pediatric): Secondary | ICD-10-CM | POA: Diagnosis present

## 2022-01-26 DIAGNOSIS — Z7984 Long term (current) use of oral hypoglycemic drugs: Secondary | ICD-10-CM | POA: Diagnosis not present

## 2022-01-26 DIAGNOSIS — Z953 Presence of xenogenic heart valve: Secondary | ICD-10-CM | POA: Diagnosis not present

## 2022-01-26 DIAGNOSIS — D6869 Other thrombophilia: Secondary | ICD-10-CM

## 2022-01-26 DIAGNOSIS — Z96652 Presence of left artificial knee joint: Secondary | ICD-10-CM | POA: Diagnosis present

## 2022-01-26 DIAGNOSIS — E669 Obesity, unspecified: Secondary | ICD-10-CM | POA: Diagnosis present

## 2022-01-26 DIAGNOSIS — I10 Essential (primary) hypertension: Secondary | ICD-10-CM | POA: Diagnosis not present

## 2022-01-26 DIAGNOSIS — I110 Hypertensive heart disease with heart failure: Secondary | ICD-10-CM | POA: Diagnosis present

## 2022-01-26 DIAGNOSIS — Z7901 Long term (current) use of anticoagulants: Secondary | ICD-10-CM

## 2022-01-26 DIAGNOSIS — Z87891 Personal history of nicotine dependence: Secondary | ICD-10-CM | POA: Diagnosis not present

## 2022-01-26 DIAGNOSIS — Z6833 Body mass index (BMI) 33.0-33.9, adult: Secondary | ICD-10-CM | POA: Diagnosis not present

## 2022-01-26 LAB — BASIC METABOLIC PANEL
Anion gap: 8 (ref 5–15)
BUN: 22 mg/dL — ABNORMAL HIGH (ref 6–20)
CO2: 25 mmol/L (ref 22–32)
Calcium: 9 mg/dL (ref 8.9–10.3)
Chloride: 107 mmol/L (ref 98–111)
Creatinine, Ser: 1.2 mg/dL (ref 0.61–1.24)
GFR, Estimated: >60 mL/min (ref >60)
Glucose, Bld: 128 mg/dL — ABNORMAL HIGH (ref 70–99)
Potassium: 4 mmol/L (ref 3.5–5.1)
Sodium: 140 mmol/L (ref 135–145)

## 2022-01-26 LAB — MAGNESIUM: Magnesium: 2.3 mg/dL (ref 1.7–2.4)

## 2022-01-26 LAB — HIV ANTIBODY (ROUTINE TESTING W REFLEX): HIV Screen 4th Generation wRfx: NONREACTIVE

## 2022-01-26 MED ORDER — PANTOPRAZOLE SODIUM 40 MG PO TBEC
40.0000 mg | DELAYED_RELEASE_TABLET | Freq: Every day | ORAL | Status: DC
  Administered 2022-01-26 – 2022-01-29 (×4): 40 mg via ORAL
  Filled 2022-01-26 (×4): qty 1

## 2022-01-26 MED ORDER — SODIUM CHLORIDE 0.9% FLUSH
3.0000 mL | Freq: Two times a day (BID) | INTRAVENOUS | Status: DC
  Administered 2022-01-26 – 2022-01-28 (×3): 3 mL via INTRAVENOUS

## 2022-01-26 MED ORDER — LINAGLIPTIN 5 MG PO TABS
5.0000 mg | ORAL_TABLET | Freq: Every day | ORAL | Status: DC
  Administered 2022-01-26 – 2022-01-28 (×3): 5 mg via ORAL
  Filled 2022-01-26 (×3): qty 1

## 2022-01-26 MED ORDER — LORATADINE 10 MG PO TABS
10.0000 mg | ORAL_TABLET | Freq: Every day | ORAL | Status: DC
  Administered 2022-01-27 – 2022-01-29 (×3): 10 mg via ORAL
  Filled 2022-01-26 (×3): qty 1

## 2022-01-26 MED ORDER — METFORMIN HCL 500 MG PO TABS
1000.0000 mg | ORAL_TABLET | Freq: Two times a day (BID) | ORAL | Status: DC
  Administered 2022-01-26 – 2022-01-29 (×5): 1000 mg via ORAL
  Filled 2022-01-26 (×5): qty 2

## 2022-01-26 MED ORDER — LOSARTAN POTASSIUM 25 MG PO TABS
25.0000 mg | ORAL_TABLET | Freq: Every evening | ORAL | Status: DC
  Administered 2022-01-26 – 2022-01-27 (×2): 25 mg via ORAL
  Filled 2022-01-26 (×3): qty 1

## 2022-01-26 MED ORDER — POTASSIUM CHLORIDE CRYS ER 20 MEQ PO TBCR
20.0000 meq | EXTENDED_RELEASE_TABLET | Freq: Every day | ORAL | Status: DC
  Administered 2022-01-27 – 2022-01-29 (×3): 20 meq via ORAL
  Filled 2022-01-26 (×3): qty 1

## 2022-01-26 MED ORDER — SIMVASTATIN 20 MG PO TABS
10.0000 mg | ORAL_TABLET | Freq: Every evening | ORAL | Status: DC
  Administered 2022-01-26 – 2022-01-28 (×3): 10 mg via ORAL
  Filled 2022-01-26 (×3): qty 1

## 2022-01-26 MED ORDER — MAGNESIUM OXIDE -MG SUPPLEMENT 400 (240 MG) MG PO TABS
400.0000 mg | ORAL_TABLET | Freq: Every day | ORAL | Status: DC
  Administered 2022-01-27 – 2022-01-29 (×3): 400 mg via ORAL
  Filled 2022-01-26 (×3): qty 1

## 2022-01-26 MED ORDER — METOPROLOL SUCCINATE ER 50 MG PO TB24
50.0000 mg | ORAL_TABLET | Freq: Every day | ORAL | Status: DC
  Administered 2022-01-26 – 2022-01-29 (×4): 50 mg via ORAL
  Filled 2022-01-26 (×4): qty 1

## 2022-01-26 MED ORDER — FERROUS SULFATE 325 (65 FE) MG PO TABS
325.0000 mg | ORAL_TABLET | Freq: Every evening | ORAL | Status: DC
  Administered 2022-01-26 – 2022-01-28 (×3): 325 mg via ORAL
  Filled 2022-01-26 (×3): qty 1

## 2022-01-26 MED ORDER — SODIUM CHLORIDE 0.9 % IV SOLN: 250.0000 mL | INTRAVENOUS | Status: DC | PRN

## 2022-01-26 MED ORDER — SITAGLIPTIN PHOS-METFORMIN HCL 50-1000 MG PO TABS: 1.0000 | ORAL_TABLET | Freq: Two times a day (BID) | ORAL | Status: DC

## 2022-01-26 MED ORDER — SODIUM CHLORIDE 0.9% FLUSH: 3.0000 mL | INTRAVENOUS | Status: DC | PRN

## 2022-01-26 MED ORDER — DOFETILIDE 250 MCG PO CAPS
250.0000 ug | ORAL_CAPSULE | Freq: Two times a day (BID) | ORAL | Status: DC
  Administered 2022-01-26: 250 ug via ORAL
  Filled 2022-01-26: qty 1

## 2022-01-26 MED ORDER — RIVAROXABAN 20 MG PO TABS
20.0000 mg | ORAL_TABLET | Freq: Every day | ORAL | Status: DC
  Administered 2022-01-26 – 2022-01-28 (×3): 20 mg via ORAL
  Filled 2022-01-26 (×3): qty 1

## 2022-01-26 MED ORDER — FUROSEMIDE 40 MG PO TABS
40.0000 mg | ORAL_TABLET | Freq: Every day | ORAL | Status: DC
  Administered 2022-01-27 – 2022-01-29 (×3): 40 mg via ORAL
  Filled 2022-01-26 (×3): qty 1

## 2022-01-26 NOTE — Care Management (Signed)
01-26-22 1502 Patient presented for Tikosyn Load. Benefits check submitted for cost. Case Manager will follow for cost and pharmacy of choice as the patient progresses.

## 2022-01-26 NOTE — TOC Benefit Eligibility Note (Signed)
Patient Teacher, English as a foreign language completed.    The patient is currently admitted and upon discharge could be taking dofetilide (Tikosyn) 250 mcg capsules.  The current 30 day co-pay is $100.00.   The patient is insured through [place] Patient Advocate Specialist [place] Patient Advocate Team Direct Number: [phone]  Fax: [phone]

## 2022-01-26 NOTE — H&P (View-Only) (Signed)
Primary Care Physician: Lowella Petties, MD Referring Physician:  Dr. Gaspar Garbe Philip [name] is a 61 y.o. 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 Pt had been staying in SR for a long period of time on Tikosyn 250 mcg bid and talked Dr. Rayann Heman into stopping drug  in June since he was doing so well. Unfortunately, he went back into afib in September.He was in [place] at the time. He was cardioverted in the local ER but AF recurred about 12 hours later. He saw Dr. Myles Gip 10/2 and decided to reload tikosyn. He was on 250 mcg in the past for borderline qt. Qt today is 499 in afib. No benadryl use and no missed anticoagulation. Pt is not taking any contraindicated drugs, as reviewed by PharmD.   Today, he denies symptoms of palpitations, chest pain, shortness of breath, orthopnea, PND, lower extremity edema, dizziness, presyncope, syncope, or neurologic sequela. The patient is tolerating medications without difficulties and is otherwise without complaint today.   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   Hx of bacterial endocarditis 11/2003   Hypertension    Neuropathy    feet bilat    Obesity    Persistent atrial fibrillation (HCC)    Sleep apnea    uses CPAP nightly   Toe deformity 08/2011   right claw hallux   Past Surgical History:  Procedure Laterality Date   adenoid surgery      AORTIC VALVE REPLACEMENT  11/2003   ATRIAL FIBRILLATION ABLATION N/A 03/19/2021   Procedure: ATRIAL  FIBRILLATION ABLATION;  Surgeon: Thompson Grayer, MD;  Location: [facility];  Service: Cardiovascular;  Laterality: N/A;   CARDIOVERSION N/A 07/25/2017   Procedure: CARDIOVERSION;  Surgeon: Skeet Latch, MD;  Location: [facility];  Service: Cardiovascular;  Laterality: N/A;   CARDIOVERSION N/A 09/07/2017   Procedure: CARDIOVERSION;  Surgeon: Pixie Casino, MD;  Location: [facility]COPY;  Service: Cardiovascular;  Laterality: N/A;   CARDIOVERSION N/A 09/29/2017   Procedure: CARDIOVERSION;  Surgeon: Sanda Klein, MD;  Location: [facility];  Service: Cardiovascular;  Laterality: N/A;   CARDIOVERSION N/A 12/26/2020   Procedure: CARDIOVERSION;  Surgeon: Josue Hector, MD;  Location: [facility] END[facility]COPY;  Service: Cardiovascular;  Laterality: N/A;   COLONOSCOPY WITH PROPOFOL N/A 03/30/2017   Procedure: COLONOSCOPY WITH PROPOFOL;  Surgeon: Yetta Flock, MD;  Location: [facility] ENDOSCOPY;  Service: Gastroenterology;  Laterality: N/A;   ESOPHAGOGASTRODUODENOSCOPY (EGD) WITH PROPOFOL N/A 03/29/2017   Procedure: ESOPHAGOGASTRODUODENOSCOPY (EGD) WITH PROPOFOL;  Surgeon: Yetta Flock, MD;  Location: [facility]OSCOPY;  Service: Gastroenterology;  Laterality: N/A;   GASTRIC BYPASS  03/2004   GIVENS CAPSULE STUDY N/A 03/30/2017   Procedure: GIVENS CAPSULE STUDY;  Surgeon: Yetta Flock, MD;  Location: [facility];  Service: Gastroenterology;  Laterality: N/A;   KNEE ARTHROSCOPY  08/09/2007 - left   07/15/2003 - right   METATARSAL OSTEOTOMY  03/20/2010   right 1st MT; gastroc soleus recession   NASAL SINUS SURGERY     TENDON RELEASE  09/09/2011   Procedure: HEEL CORD LENGTHENING;  Surgeon: John Hewitt, MD;  Location: [facility];  Service: Orthopedics;  Laterality: Right;  Righ tachilles tendon lengthening   TOE FUSION  07/12/2005   left 3rd toe PIP and DIP fusion   TONSILLECTOMY AND ADENOIDECTOMY     TOTAL KNEE ARTHROPLASTY Left 03/18/2015   Procedure: LEFT TOTAL KNEE  ARTHROPLASTY;  Surgeon: Matthew Olin, MD;  Location: [facility];  Service: Orthopedics;  Laterality: Left;    Current Facility-Administered Medications  Medication Dose Route Frequency Provider Last Rate Last Admin   0.9 %  sodium chloride infusion  250 mL Intravenous PRN Carroll, Donna C, NP       dofetilide (TIKOSYN) capsule 250 mcg  250 mcg Oral BID Carroll, Donna C, NP       ferrous sulfate tablet 325 mg  325 mg Oral QPM Carroll, Donna C, NP       [START ON 01/27/2022] furosemide (LASIX) tablet 40 mg  40 mg Oral Daily Carroll, Donna C, NP       linagliptin (TRADJENTA) tablet 5 mg  5 mg Oral Q supper Mealor, Augustus E, MD       And   metFORMIN (GLUCOPHAGE) tablet 1,000 mg  1,000 mg Oral BID WC Mealor, Augustus E, MD       [START ON 01/27/2022] loratadine (CLARITIN) tablet 10 mg  10 mg Oral Daily Carroll, Donna C, NP       losartan (COZAAR) tablet 25 mg  25 mg Oral QPM Carroll, Donna C, NP       [START ON 01/27/2022] magnesium oxide (MAG-OX) tablet 400 mg  400 mg Oral Daily Carroll, Donna C, NP       metoprolol succinate (TOPROL-XL) 24 hr tablet 50 mg  50 mg Oral Daily Carroll, Donna C, NP       pantoprazole (PROTONIX) EC tablet 40 mg  40 mg Oral Daily Carroll, Donna C, NP       [START ON 01/27/2022] potassium chloride SA (KLOR-CON M) CR tablet 20 mEq  20 mEq Oral Daily Carroll, Donna C, NP       rivaroxaban (XARELTO) tablet 20 mg  20 mg Oral Q supper Carroll, Donna C, NP       simvastatin (ZOCOR) tablet 10 mg  10 mg Oral QPM Carroll, Donna C, NP       sodium chloride flush (NS) 0.9 % injection 3 mL  3 mL Intravenous Q12H Carroll, Donna C, NP   3 mL at 01/26/22 1517   sodium chloride flush (NS) 0.9 % injection 3 mL  3 mL Intravenous PRN Carroll, Donna C, NP        Allergies  Allergen Reactions   Penicillins Hives, Swelling and Other (See Comments)    NO REACTION LISTED Has patient had a PCN reaction causing immediate rash, facial/tongue/throat swelling, SOB or lightheadedness with  hypotension: Yes Has patient had a PCN reaction causing severe rash involving mucus membranes or skin necrosis: No Has patient had a PCN reaction that required hospitalization No Has patient had a PCN reaction occurring within the last 10 years: No If all of the above answers are "NO", then may proceed with Cephalosporin use.  Has taken Keflex w/o issue        Social History   Socioeconomic History   Marital status: Widowed    Spouse name: Not   on file   Number of children: Not on file   Years of education: Not on file   Highest education level: Not on file  Occupational History   Not on file  Tobacco Use   Smoking status: Former    Packs/day: 1.00    Years: 2.00    Total pack years: 2.00    Types: Cigars, Pipe, Cigarettes    Quit date: 01/31/2011    Years since quitting: 10.9   Smokeless tobacco: Never   Tobacco comments:    Former smoker (01/01/2021)  Vaping Use   Vaping Use: Never used  Substance and Sexual Activity   Alcohol use: Yes    Alcohol/week: 2.0 standard drinks of alcohol    Types: 2 Standard drinks or equivalent per week    Comment: 2 drinks a month 01/26/22   Drug use: No   Sexual activity: Not on file  Other Topics Concern   Not on file  Social History Narrative   Not on file   Social Determinants of Health   Financial Resource Strain: Not on file  Food Insecurity: Not on file  Transportation Needs: Not on file  Physical Activity: Not on file  Stress: Not on file  Social Connections: Not on file  Intimate Partner Violence: Not on file    Family History  Problem Relation Age of Onset   Alzheimer's disease Mother    Diabetes Father    Colon cancer Unknown        family history    ROS- All systems are reviewed and negative except as per the HPI above  Physical Exam: Vitals:   01/26/22 1442  BP: 127/82  Pulse: 95  Resp: 16  Temp: 98 F (36.7 C)  TempSrc: Oral  SpO2: 100%   Wt Readings from Last 3 Encounters:  01/26/22 124.3  kg  01/18/22 122.5 kg  10/28/21 117 kg    Labs: Lab Results  Component Value Date   NA 140 01/26/2022   K 4.0 01/26/2022   CL 107 01/26/2022   CO2 25 01/26/2022   GLUCOSE 128 (H) 01/26/2022   BUN 22 (H) 01/26/2022   CREATININE 1.20 01/26/2022   CALCIUM 9.0 01/26/2022   MG 2.3 01/26/2022   Lab Results  Component Value Date   INR 1.3 (H) 07/14/2017   No results found for: "CHOL", "HDL", "LDLCALC", "TRIG"   GEN- The patient is well appearing, alert and oriented x 3 today.   Head- normocephalic, atraumatic Eyes-  Sclera clear, conjunctiva pink Ears- hearing intact Oropharynx- clear Neck- supple, no JVP Lymph- no cervical lymphadenopathy Lungs- Clear to ausculation bilaterally, normal work of breathing Heart- Regular rate and rhythm, no murmurs, rubs or gallops, PMI not laterally displaced GI- soft, NT, ND, + BS Extremities- no clubbing, cyanosis, or edema MS- no significant deformity or atrophy Skin- no rash or lesion Psych- euthymic mood, full affect Neuro- strength and sensation are intact  EKG- Vent. rate 91 BPM PR interval * ms QRS duration 106 ms QT/QTcB 406/499 ms P-R-T axes * -33 -25 Atrial fibrillation Left axis deviation Nonspecific T wave abnormality Prolonged QT Abnormal ECG When compared with ECG of 16-Apr-2021 09:11, PREVIOUS ECG IS PRESENT    Assessment and Plan:  1. Recurrent persistent afib Here today for Tikosyn reloading, stopped in June Took 250 mcg Tikosyn in the past,  longer qt interval on higher doses  No benadryl use  No contraindicated drugs Labs with acceptable K+/Mag today for Tikosyn use   2. CHA2DS2VASc score  of at least 5  States no missed xarelto 20 mg x at least 21 days  3. HTN Controlled   To 6E when bed available   Lupita Leash C. [facility] [address] [place], [place] [address] [phone]   I have seen and examined this patient with Sharilyn Sites.  Agree with above, note  added to reflect my findings.  Patient is a history of atrial fibrillation.  Is status post ablation.  He was previously on dofetilide but dofetilide has been stopped.  He has since had more frequent episodes of atrial fibrillation and has become persistent.  He would prefer a rhythm control strategy and has been admitted for dofetilide load.  GEN: Well nourished, well developed, in no acute distress  HEENT: normal  Neck: no JVD, carotid bruits, or masses Cardiac: Irregular; no murmurs, rubs, or gallops,no edema  Respiratory:  clear to auscultation bilaterally, normal work of breathing GI: soft, nontender, nondistended, + BS MS: no deformity or atrophy  Skin: warm and dry Neuro:  Strength and sensation are intact Psych: euthymic mood, full affect   Persistent atrial fibrillation: Admission today for dofetilide loading.  He was previously on 250 mcg twice daily.  We Ratasha Fabre continue at the current dose.  We Jamey Harman keep magnesium and potassium at acceptable levels.  Continue Xarelto 20 mg daily.  Tatsuya Okray M. Wilmar Prabhakar MD 01/26/2022 3:44 PM

## 2022-01-26 NOTE — Progress Notes (Signed)
Pharmacy: Dofetilide (Tikosyn) - Initial Consult Assessment and Electrolyte Replacement  Pharmacy consulted to assist in monitoring and replacing electrolytes in this 61 y.o. male admitted on 01/26/2022 undergoing dofetilide re-initiation. First dofetilide dose: 01/26/22  Assessment:  Patient Exclusion Criteria: If any screening criteria checked as "Yes", then  patient  should NOT receive dofetilide until criteria item is corrected.  If "Yes" please indicate correction plan.  YES  NO Patient  Exclusion Criteria Correction Plan   [x]   []   Baseline QTc interval is greater than or equal to 440 msec. IF above YES box checked dofetilide contraindicated unless patient has ICD; then may proceed if QTc 500-550 msec or with known ventricular conduction abnormalities may proceed with QTc 550-600 msec. QTc = 499 EP aware   []   [x]   Patient is known or suspected to have a digoxin level greater than 2 ng/ml: No results found for: "DIGOXIN"     []   [x]   Creatinine clearance less than 20 ml/min (calculated using Cockcroft-Gault, actual body weight and serum creatinine): Estimated Creatinine Clearance: 94.3 mL/min (by C-G formula based on SCr of 1.2 mg/dL).     []   [x]  Patient has received drugs known to prolong the QT intervals within the last 48 hours (phenothiazines, tricyclics or tetracyclic antidepressants, erythromycin, H-1 antihistamines, cisapride, fluoroquinolones, azithromycin, ondansetron).   Updated information on QT prolonging agents is available to be searched on the following database:QT prolonging agents     []   [x]   Patient received a dose of hydrochlorothiazide (Oretic) alone or in any combination including triamterene (Dyazide, Maxzide) in the last 48 hours.    []   [x]  Patient received a medication known to increase dofetilide plasma concentrations prior to initial dofetilide dose:  Trimethoprim (Primsol, Proloprim) in the last 36 hours Verapamil (Calan, Verelan) in the  last 36 hours or a sustained release dose in the last 72 hours Megestrol (Megace) in the last 5 days  Cimetidine (Tagamet) in the last 6 hours Ketoconazole (Nizoral) in the last 24 hours Itraconazole (Sporanox) in the last 48 hours  Prochlorperazine (Compazine) in the last 36 hours     []   [x]   Patient is known to have a history of torsades de pointes; congenital or acquired long QT syndromes.    []   [x]   Patient has received a Class 1 antiarrhythmic with less than 2 half-lives since last dose. (Disopyramide, Quinidine, Procainamide, Lidocaine, Mexiletine, Flecainide, Propafenone)    []   [x]   Patient has received amiodarone therapy in the past 3 months or amiodarone level is greater than 0.3 ng/ml.    Labs:    Component Value Date/Time   K 4.0 01/26/2022 0835   MG 2.3 01/26/2022     Plan: [facility] One Calculated CrCl  Dose q12h  [x]  > 60 ml/min 500 mcg  []  40-60 ml/min 250 mcg  []  20-40 ml/min 125 mcg  **Using lower dose of 250 mcg BID as pt historically required dose-reduction due to QTc prolongation  []   Physician selected initial dose within range recommended for patients level of renal function - will monitor for response.  [x]   Physician selected initial dose outside of range recommended for patients level of renal function - will discuss if the dose should be altered at this time.   Patient has been appropriately anticoagulated with Xarelto.  Potassium: K >/= 4: Appropriate to initiate Tikosyn, no replacement needed    Magnesium: Mg >2: Appropriate to initiate Tikosyn, no replacement needed     Thank you for  allowing pharmacy to participate in this patient's care   Arrie Senate, PharmD, BCPS, [facility] Clinical Pharmacist [phone] Please check AMION for all [place] numbers 01/26/2022

## 2022-01-26 NOTE — Telephone Encounter (Signed)
Pharmacy Patient Advocate Encounter  Insurance verification completed.    The patient is insured through [employer]   The patient is currently admitted and ran test claims for the following: dofetilide (Tikosyn) .  Copays and coinsurance results were relayed to Inpatient clinical team.

## 2022-01-26 NOTE — Progress Notes (Signed)
Primary Care Physician: [name], MD Referring Physician:  Dr. Mable Paris Philip [name] is a 61 y.o. 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 Pt had been staying in SR for a long period of time on Tikosyn 250 mcg bid and talked [name] into stopping drug  in June since he was doing so well. Unfortunately, he went back into afib in September.He was in [place] at the time. He was cardioverted in the local ER but AF recurred about 12 hours later. He saw [name] 10/2 and decided to reload tikosyn. He was on 250 mcg in the past for borderline qt. Qt today is 499 in afib. No benadryl use and no missed anticoagulation. Pt is not taking any contraindicated drugs, as reviewed by PharmD.   Today, he denies symptoms of palpitations, chest pain, shortness of breath, orthopnea, PND, lower extremity edema, dizziness, presyncope, syncope, or neurologic sequela. The patient is tolerating medications without difficulties and is otherwise without complaint today.   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   Hx of bacterial endocarditis 11/2003   Hypertension    Neuropathy    feet bilat    Obesity    Persistent atrial fibrillation (HCC)    Sleep apnea    uses CPAP nightly   Toe deformity 08/2011   right claw hallux   Past Surgical History:  Procedure Laterality Date   adenoid surgery      AORTIC VALVE REPLACEMENT  11/2003   ATRIAL FIBRILLATION ABLATION N/A 03/19/2021   Procedure: ATRIAL  FIBRILLATION ABLATION;  Surgeon: [name], MD;  Location: [facility];  Service: Cardiovascular;  Laterality: N/A;   CARDIOVERSION N/A 07/25/2017   Procedure: CARDIOVERSION;  Surgeon: Chilton Si, MD;  Location: [facility] ENDOSCOPY;  Service: Cardiovascular;  Laterality: N/A;   CARDIOVERSION N/A 09/07/2017   Procedure: CARDIOVERSION;  Surgeon: Chrystie Nose, MD;  Location: [facility] ENDOSCOPY;  Service: Cardiovascular;  Laterality: N/A;   CARDIOVERSION N/A 09/29/2017   Procedure: CARDIOVERSION;  Surgeon: Thurmon Fair, MD;  Location: [facility] ENDOSCOPY;  Service: Cardiovascular;  Laterality: N/A;   CARDIOVERSION N/A 12/26/2020   Procedure: CARDIOVERSION;  Surgeon: Wendall Stade, MD;  Location: [facility] ENDOSCOPY;  Service: Cardiovascular;  Laterality: N/A;   COLONOSCOPY WITH PROPOFOL N/A 03/30/2017   Procedure: COLONOSCOPY WITH PROPOFOL;  Surgeon: Benancio Deeds, MD;  Location: [facility] ENDOSCOPY;  Service: Gastroenterology;  Laterality: N/A;   ESOPHAGOGASTRODUODENOSCOPY (EGD) WITH PROPOFOL N/A 03/29/2017   Procedure: ESOPHAGOGASTRODUODENOSCOPY (EGD) WITH PROPOFOL;  Surgeon: Benancio Deeds, MD;  Location: [facility] ENDOSCOPY;  Service: Gastroenterology;  Laterality: N/A;   GASTRIC BYPASS  03/2004   GIVENS CAPSULE STUDY N/A 03/30/2017   Procedure: GIVENS CAPSULE STUDY;  Surgeon: Benancio Deeds, MD;  Location: [facility] ENDOSCOPY;  Service: Gastroenterology;  Laterality: N/A;   KNEE ARTHROSCOPY  08/09/2007 - left   07/15/2003 - right   METATARSAL OSTEOTOMY  03/20/2010   right 1st MT; gastroc soleus recession   NASAL SINUS SURGERY     TENDON RELEASE  09/09/2011   Procedure: HEEL CORD LENGTHENING;  Surgeon: Wylene Simmer, MD;  Location: [place];  Service: Orthopedics;  Laterality: Right;  Righ tachilles tendon lengthening   TOE FUSION  07/12/2005   left 3rd toe PIP and DIP fusion   TONSILLECTOMY AND ADENOIDECTOMY     TOTAL KNEE ARTHROPLASTY Left 03/18/2015   Procedure: LEFT TOTAL KNEE  ARTHROPLASTY;  Surgeon: Paralee Cancel, MD;  Location: [facility] ORS;  Service: Orthopedics;  Laterality: Left;    Current Outpatient Medications  Medication Sig Dispense Refill   Accu-Chek FastClix Lancets MISC 2 (two) times daily. as directed     ACCU-CHEK GUIDE test strip 2 (two) times daily.     Ascorbic Acid (VITAMIN C) 1000 MG tablet Take 1,000 mg by mouth in the morning.     Calcium Carb-Cholecalciferol (CALCIUM 600+D3 PO) Take 1 tablet by mouth in the morning and at bedtime.     cetirizine (ZYRTEC) 10 MG tablet Take 10 mg by mouth daily.      clindamycin (CLEOCIN) 150 MG capsule Take 600 mg by mouth See admin instructions. Take 4 tablets by mouth 1 hour prior to seeing the dentist     esomeprazole (NEXIUM) 40 MG capsule Take 40 mg by mouth every evening.      ferrous sulfate 325 (65 FE) MG tablet Take 325 mg by mouth every evening.     furosemide (LASIX) 40 MG tablet Take daily as needed for edema or shortness of breath 30 tablet 6   KLOR-CON M20 20 MEQ tablet TAKE 1 TABLET DAILY, AND 1 EXTRA TABLET ON DAYS       TAKING  METOLAZONE. 95 tablet 2   losartan (COZAAR) 25 MG tablet Take 25 mg by mouth every evening.      magnesium oxide (MAG-OX) 400 (240 Mg) MG tablet Take 400 mg by mouth daily.     metoprolol succinate (TOPROL XL) 25 MG 24 hr tablet Take 2 tablets (50 mg total) by mouth daily. 90 tablet 3   nystatin-triamcinolone (MYCOLOG II) cream Apply 1 application topically 3 (three) times daily as needed (yeast/irritation).     sildenafil (VIAGRA) 100 MG tablet Take 100 mg by mouth as needed.     simvastatin (ZOCOR) 10 MG tablet Take 10 mg by mouth every evening.      sitaGLIPtin-metformin (JANUMET) 50-1000 MG tablet Take 1 tablet by mouth 2 (two) times daily with a meal.      tadalafil (CIALIS) 20 MG tablet Take 20 mg by mouth as needed.     XARELTO 20 MG TABS tablet TAKE 1 TABLET DAILY WITH   SUPPER 90 tablet 3   No current facility-administered medications for this encounter.     Allergies  Allergen Reactions   Penicillins Hives, Swelling and Other (See Comments)    NO REACTION LISTED Has patient had a PCN reaction causing immediate rash, facial/tongue/throat swelling, SOB or lightheadedness with hypotension: Yes Has patient had a PCN reaction causing severe rash involving mucus membranes or skin necrosis: No Has patient had a PCN reaction that required hospitalization No Has patient had a PCN reaction occurring within the last 10 years: No If all of the above answers are "NO", then may proceed with Cephalosporin use.  Has taken Keflex w/o issue        Social History   Socioeconomic History   Marital status: Widowed  Spouse name: Not on file   Number of children: Not on file   Years of education: Not on file   Highest education level: Not on file  Occupational History   Not on file  Tobacco Use   Smoking status: Former    Packs/day: 1.00    Years: 2.00    Total pack years: 2.00    Types: Cigars, Pipe, Cigarettes    Quit date: 01/31/2011    Years since quitting: 10.9   Smokeless tobacco: Never   Tobacco comments:    Former smoker (01/01/2021)  Vaping Use   Vaping Use: Never used  Substance and Sexual Activity   Alcohol use: Yes    Alcohol/week: 2.0 standard drinks of alcohol    Types: 2 Standard drinks or equivalent per week    Comment: 2 drinks a month 01/26/22   Drug use: No   Sexual activity: Not on file  Other Topics Concern   Not on file  Social History Narrative   Not on file   Social Determinants of Health   Financial Resource Strain: Not on file  Food Insecurity: Not on file  Transportation Needs: Not on file  Physical Activity: Not on file  Stress: Not on file  Social Connections: Not on file  Intimate Partner Violence: Not on file    Family History  Problem Relation Age of Onset   Alzheimer's disease Mother    Diabetes Father    Colon cancer Unknown        family history    ROS- All systems are reviewed  and negative except as per the HPI above  Physical Exam: Vitals:   01/26/22 0845  BP: (!) 136/90  Pulse: 91  Weight: 124.3 kg  Height: 6\' 4"  (1.93 m)   Wt Readings from Last 3 Encounters:  01/26/22 124.3 kg  01/18/22 122.5 kg  10/28/21 117 kg    Labs: Lab Results  Component Value Date   NA 140 01/26/2022   K 4.0 01/26/2022   CL 107 01/26/2022   CO2 25 01/26/2022   GLUCOSE 128 (H) 01/26/2022   BUN 22 (H) 01/26/2022   CREATININE 1.20 01/26/2022   CALCIUM 9.0 01/26/2022   MG 2.3 01/26/2022   Lab Results  Component Value Date   INR 1.3 (H) 07/14/2017   No results found for: "CHOL", "HDL", "LDLCALC", "TRIG"   GEN- The patient is well appearing, alert and oriented x 3 today.   Head- normocephalic, atraumatic Eyes-  Sclera clear, conjunctiva pink Ears- hearing intact Oropharynx- clear Neck- supple, no JVP Lymph- no cervical lymphadenopathy Lungs- Clear to ausculation bilaterally, normal work of breathing Heart- Regular rate and rhythm, no murmurs, rubs or gallops, PMI not laterally displaced GI- soft, NT, ND, + BS Extremities- no clubbing, cyanosis, or edema MS- no significant deformity or atrophy Skin- no rash or lesion Psych- euthymic mood, full affect Neuro- strength and sensation are intact  EKG- Vent. rate 91 BPM PR interval * ms QRS duration 106 ms QT/QTcB 406/499 ms P-R-T axes * -33 -25 Atrial fibrillation Left axis deviation Nonspecific T wave abnormality Prolonged QT Abnormal ECG When compared with ECG of 16-Apr-2021 09:11, PREVIOUS ECG IS PRESENT    Assessment and Plan:  1. Recurrent persistent afib Here today for Tikosyn reloading, stopped in June Took 250 mcg Tikosyn in the past,  longer qt interval on higher doses  No benadryl use  No contraindicated drugs Labs with acceptable K+/Mag today for Tikosyn use   2. CHA2DS2VASc score of  at least 5  States no missed xarelto 20 mg x at least 21 days  3. HTN Controlled   To 6E when bed  available   Butch Penny C. Gaby Harney, [facility] [address] [place], [place] [address] [phone]

## 2022-01-26 NOTE — Progress Notes (Signed)
Primary Care Physician: Lowella Petties, MD Referring Physician:  Dr. Gaspar Garbe RENWICK ASMAN is a 61 y.o. 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 Pt had been staying in SR for a long period of time on Tikosyn 250 mcg bid and talked Dr. Rayann Heman into stopping drug  in June since he was doing so well. Unfortunately, he went back into afib in September.He was in [place] at the time. He was cardioverted in the local ER but AF recurred about 12 hours later. He saw Dr. Myles Gip 10/2 and decided to reload tikosyn. He was on 250 mcg in the past for borderline qt. Qt today is 499 in afib. No benadryl use and no missed anticoagulation. Pt is not taking any contraindicated drugs, as reviewed by PharmD.   Today, he denies symptoms of palpitations, chest pain, shortness of breath, orthopnea, PND, lower extremity edema, dizziness, presyncope, syncope, or neurologic sequela. The patient is tolerating medications without difficulties and is otherwise without complaint today.   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   Hx of bacterial endocarditis 11/2003   Hypertension    Neuropathy    feet bilat    Obesity    Persistent atrial fibrillation (HCC)    Sleep apnea    uses CPAP nightly   Toe deformity 08/2011   right claw hallux   Past Surgical History:  Procedure Laterality Date   adenoid surgery      AORTIC VALVE REPLACEMENT  11/2003   ATRIAL FIBRILLATION ABLATION N/A 03/19/2021   Procedure: ATRIAL  FIBRILLATION ABLATION;  Surgeon: Thompson Grayer, MD;  Location: [place] CV LAB;  Service: Cardiovascular;  Laterality: N/A;   CARDIOVERSION N/A 07/25/2017   Procedure: CARDIOVERSION;  Surgeon: Skeet Latch, MD;  Location: [facility];  Service: Cardiovascular;  Laterality: N/A;   CARDIOVERSION N/A 09/07/2017   Procedure: CARDIOVERSION;  Surgeon: Pixie Casino, MD;  Location: [facility] ENDOSCOPY;  Service: Cardiovascular;  Laterality: N/A;   CARDIOVERSION N/A 09/29/2017   Procedure: CARDIOVERSION;  Surgeon: Sanda Klein, MD;  Location: [place] ENDOSCOPY;  Service: Cardiovascular;  Laterality: N/A;   CARDIOVERSION N/A 12/26/2020   Procedure: CARDIOVERSION;  Surgeon: Josue Hector, MD;  Location: [facility] ENDOSCOPY;  Service: Cardiovascular;  Laterality: N/A;   COLONOSCOPY WITH PROPOFOL N/A 03/30/2017   Procedure: COLONOSCOPY WITH PROPOFOL;  Surgeon: Yetta Flock, MD;  Location: [facility] ENDOSCOPY;  Service: Gastroenterology;  Laterality: N/A;   ESOPHAGOGASTRODUODENOSCOPY (EGD) WITH PROPOFOL N/A 03/29/2017   Procedure: ESOPHAGOGASTRODUODENOSCOPY (EGD) WITH PROPOFOL;  Surgeon: Yetta Flock, MD;  Location: [facility] ENDOSCOPY;  Service: Gastroenterology;  Laterality: N/A;   GASTRIC BYPASS  03/2004   GIVENS CAPSULE STUDY N/A 03/30/2017   Procedure: GIVENS CAPSULE STUDY;  Surgeon: Yetta Flock, MD;  Location: [facility] ENDOSCOPY;  Service: Gastroenterology;  Laterality: N/A;   KNEE ARTHROSCOPY  08/09/2007 - left   07/15/2003 - right   METATARSAL OSTEOTOMY  03/20/2010   right 1st MT; gastroc soleus recession   NASAL SINUS SURGERY     TENDON RELEASE  09/09/2011   Procedure: HEEL CORD LENGTHENING;  Surgeon: Toni Arthurs, MD;  Location: [facility];  Service: Orthopedics;  Laterality: Right;  Righ tachilles tendon lengthening   TOE FUSION  07/12/2005   left 3rd toe PIP and DIP fusion   TONSILLECTOMY AND ADENOIDECTOMY     TOTAL KNEE ARTHROPLASTY Left 03/18/2015   Procedure: LEFT TOTAL KNEE  ARTHROPLASTY;  Surgeon: Durene Romans, MD;  Location: [facility];  Service: Orthopedics;  Laterality: Left;    Current Facility-Administered Medications  Medication Dose Route Frequency Provider Last Rate Last Admin   0.9 %  sodium chloride infusion  250 mL Intravenous PRN Newman Nip, NP       dofetilide (TIKOSYN) capsule 250 mcg  250 mcg Oral BID Newman Nip, NP       ferrous sulfate tablet 325 mg  325 mg Oral QPM Newman Nip, NP       [START ON 01/27/2022] furosemide (LASIX) tablet 40 mg  40 mg Oral Daily Newman Nip, NP       linagliptin (TRADJENTA) tablet 5 mg  5 mg Oral Q supper Mealor, Roberts Gaudy, MD       And   metFORMIN (GLUCOPHAGE) tablet 1,000 mg  1,000 mg Oral BID WC Mealor, Roberts Gaudy, MD       [START ON 01/27/2022] loratadine (CLARITIN) tablet 10 mg  10 mg Oral Daily Newman Nip, NP       losartan (COZAAR) tablet 25 mg  25 mg Oral QPM Newman Nip, NP       [START ON 01/27/2022] magnesium oxide (MAG-OX) tablet 400 mg  400 mg Oral Daily Newman Nip, NP       metoprolol succinate (TOPROL-XL) 24 hr tablet 50 mg  50 mg Oral Daily Newman Nip, NP       pantoprazole (PROTONIX) EC tablet 40 mg  40 mg Oral Daily Newman Nip, NP       [START ON 01/27/2022] potassium chloride SA (KLOR-CON M) CR tablet 20 mEq  20 mEq Oral Daily Newman Nip, NP       rivaroxaban Carlena Hurl) tablet 20 mg  20 mg Oral Q supper Newman Nip, NP       simvastatin (ZOCOR) tablet 10 mg  10 mg Oral QPM Newman Nip, NP       sodium chloride flush (NS) 0.9 % injection 3 mL  3 mL Intravenous Q12H Newman Nip, NP   3 mL at 01/26/22 1517   sodium chloride flush (NS) 0.9 % injection 3 mL  3 mL Intravenous PRN Newman Nip, NP        Allergies  Allergen Reactions   Penicillins Hives, Swelling and Other (See Comments)    NO REACTION LISTED Has patient had a PCN reaction causing immediate rash, facial/tongue/throat swelling, SOB or lightheadedness with  hypotension: Yes Has patient had a PCN reaction causing severe rash involving mucus membranes or skin necrosis: No Has patient had a PCN reaction that required hospitalization No Has patient had a PCN reaction occurring within the last 10 years: No If all of the above answers are "NO", then may proceed with Cephalosporin use.  Has taken Keflex w/o issue        Social History   Socioeconomic History   Marital status: Widowed    Spouse name: Not  on file   Number of children: Not on file   Years of education: Not on file   Highest education level: Not on file  Occupational History   Not on file  Tobacco Use   Smoking status: Former    Packs/day: 1.00    Years: 2.00    Total pack years: 2.00    Types: Cigars, Pipe, Cigarettes    Quit date: 01/31/2011    Years since quitting: 10.9   Smokeless tobacco: Never   Tobacco comments:    Former smoker (01/01/2021)  Vaping Use   Vaping Use: Never used  Substance and Sexual Activity   Alcohol use: Yes    Alcohol/week: 2.0 standard drinks of alcohol    Types: 2 Standard drinks or equivalent per week    Comment: 2 drinks a month 01/26/22   Drug use: No   Sexual activity: Not on file  Other Topics Concern   Not on file  Social History Narrative   Not on file   Social Determinants of Health   Financial Resource Strain: Not on file  Food Insecurity: Not on file  Transportation Needs: Not on file  Physical Activity: Not on file  Stress: Not on file  Social Connections: Not on file  Intimate Partner Violence: Not on file    Family History  Problem Relation Age of Onset   Alzheimer's disease Mother    Diabetes Father    Colon cancer Unknown        family history    ROS- All systems are reviewed and negative except as per the HPI above  Physical Exam: Vitals:   01/26/22 1442  BP: 127/82  Pulse: 95  Resp: 16  Temp: 98 F (36.7 C)  TempSrc: Oral  SpO2: 100%   Wt Readings from Last 3 Encounters:  01/26/22 124.3  kg  01/18/22 122.5 kg  10/28/21 117 kg    Labs: Lab Results  Component Value Date   NA 140 01/26/2022   K 4.0 01/26/2022   CL 107 01/26/2022   CO2 25 01/26/2022   GLUCOSE 128 (H) 01/26/2022   BUN 22 (H) 01/26/2022   CREATININE 1.20 01/26/2022   CALCIUM 9.0 01/26/2022   MG 2.3 01/26/2022   Lab Results  Component Value Date   INR 1.3 (H) 07/14/2017   No results found for: "CHOL", "HDL", "LDLCALC", "TRIG"   GEN- The patient is well appearing, alert and oriented x 3 today.   Head- normocephalic, atraumatic Eyes-  Sclera clear, conjunctiva pink Ears- hearing intact Oropharynx- clear Neck- supple, no JVP Lymph- no cervical lymphadenopathy Lungs- Clear to ausculation bilaterally, normal work of breathing Heart- Regular rate and rhythm, no murmurs, rubs or gallops, PMI not laterally displaced GI- soft, NT, ND, + BS Extremities- no clubbing, cyanosis, or edema MS- no significant deformity or atrophy Skin- no rash or lesion Psych- euthymic mood, full affect Neuro- strength and sensation are intact  EKG- Vent. rate 91 BPM PR interval * ms QRS duration 106 ms QT/QTcB 406/499 ms P-R-T axes * -33 -25 Atrial fibrillation Left axis deviation Nonspecific T wave abnormality Prolonged QT Abnormal ECG When compared with ECG of 16-Apr-2021 09:11, PREVIOUS ECG IS PRESENT    Assessment and Plan:  1. Recurrent persistent afib Here today for Tikosyn reloading, stopped in June Took 250 mcg Tikosyn in the past,  longer qt interval on higher doses  No benadryl use  No contraindicated drugs Labs with acceptable K+/Mag today for Tikosyn use   2. CHA2DS2VASc score  of at least 5  States no missed xarelto 20 mg x at least 21 days  3. HTN Controlled   To 6E when bed available   Lupita Leash C. Matthew Folks [facility] [address] [place], [place] [address] [phone]   I have seen and examined this patient with Sharilyn Sites.  Agree with above, note  added to reflect my findings.  Patient is a history of atrial fibrillation.  Is status post ablation.  He was previously on dofetilide but dofetilide has been stopped.  He has since had more frequent episodes of atrial fibrillation and has become persistent.  He would prefer a rhythm control strategy and has been admitted for dofetilide load.  GEN: Well nourished, well developed, in no acute distress  HEENT: normal  Neck: no JVD, carotid bruits, or masses Cardiac: Irregular; no murmurs, rubs, or gallops,no edema  Respiratory:  clear to auscultation bilaterally, normal work of breathing GI: soft, nontender, nondistended, + BS MS: no deformity or atrophy  Skin: warm and dry Neuro:  Strength and sensation are intact Psych: euthymic mood, full affect   Persistent atrial fibrillation: Admission today for dofetilide loading.  He was previously on 250 mcg twice daily.  We Cleatus Gabriel continue at the current dose.  We Dalesha Stanback keep magnesium and potassium at acceptable levels.  Continue Xarelto 20 mg daily.  Dayton Kenley M. Vivianna Piccini MD 01/26/2022 3:44 PM

## 2022-01-27 LAB — BASIC METABOLIC PANEL
Anion gap: 6 (ref 5–15)
BUN: 21 mg/dL — ABNORMAL HIGH (ref 6–20)
CO2: 27 mmol/L (ref 22–32)
Calcium: 9.3 mg/dL (ref 8.9–10.3)
Chloride: 106 mmol/L (ref 98–111)
Creatinine, Ser: 1.07 mg/dL (ref 0.61–1.24)
GFR, Estimated: >60 mL/min (ref >60)
Glucose, Bld: 99 mg/dL (ref 70–99)
Potassium: 4.1 mmol/L (ref 3.5–5.1)
Sodium: 139 mmol/L (ref 135–145)

## 2022-01-27 LAB — MAGNESIUM: Magnesium: 2.1 mg/dL (ref 1.7–2.4)

## 2022-01-27 MED ORDER — SODIUM CHLORIDE 0.9 % IV SOLN: INTRAVENOUS | Status: DC

## 2022-01-27 MED ORDER — DOFETILIDE 125 MCG PO CAPS
125.0000 ug | ORAL_CAPSULE | Freq: Two times a day (BID) | ORAL | Status: DC
  Administered 2022-01-27 – 2022-01-29 (×5): 125 ug via ORAL
  Filled 2022-01-27 (×6): qty 1

## 2022-01-27 MED ORDER — ACETAMINOPHEN 325 MG PO TABS
650.0000 mg | ORAL_TABLET | Freq: Four times a day (QID) | ORAL | Status: DC | PRN
  Administered 2022-01-27 – 2022-01-29 (×3): 650 mg via ORAL
  Filled 2022-01-27 (×3): qty 2

## 2022-01-27 NOTE — Progress Notes (Signed)
Rounding Note    Patient Name: Philip [name] Date of Encounter: 01/27/2022  [facility] Cardiologist: Rollene Rotunda, MD   Subjective   Slight HA this AM, otherwise no c/o  Inpatient Medications    Scheduled Meds:  dofetilide  125 mcg Oral BID   ferrous sulfate  325 mg Oral QPM   furosemide  40 mg Oral Daily   linagliptin  5 mg Oral Q supper   And   metFORMIN  1,000 mg Oral BID WC   loratadine  10 mg Oral Daily   losartan  25 mg Oral QPM   magnesium oxide  400 mg Oral Daily   metoprolol succinate  50 mg Oral Daily   pantoprazole  40 mg Oral Daily   potassium chloride SA  20 mEq Oral Daily   rivaroxaban  20 mg Oral Q supper   simvastatin  10 mg Oral QPM   sodium chloride flush  3 mL Intravenous Q12H   Continuous Infusions:  sodium chloride     PRN Meds: sodium chloride, sodium chloride flush   Vital Signs    Vitals:   01/26/22 1442 01/26/22 2202 01/27/22 0453  BP: 127/82 119/79 109/71  Pulse: 95 75 75  Resp: 16    Temp: 98 F (36.7 C) 98.5 F (36.9 C) 97.9 F (36.6 C)  TempSrc: Oral Oral Oral  SpO2: 100% 98% 97%   No intake or output data in the 24 hours ending 01/27/22 0745    01/26/2022    8:45 AM 01/18/2022    2:10 PM 10/28/2021   10:12 AM  Last 3 Weights  Weight (lbs) 274 lb 270 lb 258 lb  Weight (kg) 124.286 kg 122.471 kg 117.028 kg      Telemetry    AFib rates 80's mostly - Personally Reviewed  ECG    SFib 79, my manual measurement 420-484ms, 481-538ms QTc - Personally Reviewed  Physical Exam   GEN: No acute distress.   Neck: No JVD Cardiac: irreg-irreg, no murmurs, rubs, or gallops.  Respiratory: CTA b/l. GI: Soft, nontender, non-distended  MS: No edema; No deformity. Neuro:  Nonfocal  Psych: Normal affect   Labs    High Sensitivity Troponin:  No results for input(s): "TROPONINIHS" in the last 720 hours.   Chemistry Recent Labs  Lab 01/26/22 0835  NA 140  K 4.0  CL 107  CO2 25  GLUCOSE 128*  BUN 22*   CREATININE 1.20  CALCIUM 9.0  MG 2.3  GFRNONAA >60  ANIONGAP 8    Lipids No results for input(s): "CHOL", "TRIG", "HDL", "LABVLDL", "LDLCALC", "CHOLHDL" in the last 168 hours.  HematologyNo results for input(s): "WBC", "RBC", "HGB", "HCT", "MCV", "MCH", "MCHC", "RDW", "PLT" in the last 168 hours. Thyroid No results for input(s): "TSH", "FREET4" in the last 168 hours.  BNPNo results for input(s): "BNP", "PROBNP" in the last 168 hours.  DDimer No results for input(s): "DDIMER" in the last 168 hours.   Radiology    No results found.  Cardiac Studies   10/43/2022: TTE  1. Left ventricular ejection fraction, by estimation, is 60 to 65%. The  left ventricle has normal function. The left ventricle has no regional  wall motion abnormalities. There is mild asymmetric left ventricular  hypertrophy of the basal and septal  segments. Left ventricular diastolic parameters are indeterminate.   2. Right ventricular systolic function is normal. The right ventricular  size is normal.   3. Left atrial size was moderately dilated.  4. The mitral valve is abnormal. Mild mitral valve regurgitation. No  evidence of mitral stenosis.   5. Tricuspid valve regurgitation is mild to moderate.   6. The aortic valve is tricuspid. There is moderate calcification of the  aortic valve. There is moderate thickening of the aortic valve. Aortic  valve regurgitation is not visualized. Sclerosis without stenosis.   7. The inferior vena cava is normal in size with greater than 50%  respiratory variability, suggesting right atrial pressure of 3 mmHg.   Patient Profile     61 y.o. 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 Myles Gip We will reduce dose this AM for 141mcg BID DCCV tomorrow if not in SR Pt is aware and agreeable  VHD Remote AVR (bioprosthetic) and MV repair Stable findings on his echo last year  HTN Home meds  DM Home meds  Chronic CHF (HFpEF) Not overtly volume OL currently  For questions or updates, please contact [place] Please consult [email or web address] for contact info under        Signed, Baldwin Jamaica, PA-C  01/27/2022, 7:45 AM

## 2022-01-27 NOTE — Progress Notes (Signed)
Pharmacy: Dofetilide (Tikosyn) - Follow Up Assessment and Electrolyte Replacement  Pharmacy consulted to assist in monitoring and replacing electrolytes in this 61 y.o. male admitted on 01/26/2022 undergoing dofetilide re-initiation. First dofetilide dose: 01/26/22  Labs:    Component Value Date/Time   K 4.1 01/27/2022 0718   MG 2.1 01/27/2022 0718     Plan: Potassium: K >/= 4: No additional supplementation needed  Magnesium: Mg > 2: No additional supplementation needed   Thank you for allowing pharmacy to participate in this patient's care   Einar Grad 01/27/2022  8:31 AM

## 2022-01-27 NOTE — Progress Notes (Signed)
Post Tikosyn EKG showing Qtc 481, Pt remains in AFib w/controlled rate. Jessie Foot, RN

## 2022-01-28 ENCOUNTER — Inpatient Hospital Stay (HOSPITAL_COMMUNITY): Payer: 59 | Admitting: Certified Registered"

## 2022-01-28 ENCOUNTER — Encounter (HOSPITAL_COMMUNITY)
Admission: AD | Disposition: A | Discharge status: Home / Self Care | Payer: Self-pay | Source: Ambulatory Visit | Attending: Cardiovascular Disease

## 2022-01-28 ENCOUNTER — Encounter (HOSPITAL_COMMUNITY): Payer: Self-pay | Admitting: Cardiology

## 2022-01-28 DIAGNOSIS — Z7984 Long term (current) use of oral hypoglycemic drugs: Secondary | ICD-10-CM

## 2022-01-28 DIAGNOSIS — I10 Essential (primary) hypertension: Secondary | ICD-10-CM

## 2022-01-28 DIAGNOSIS — Z87891 Personal history of nicotine dependence: Secondary | ICD-10-CM

## 2022-01-28 DIAGNOSIS — E119 Type 2 diabetes mellitus without complications: Secondary | ICD-10-CM

## 2022-01-28 DIAGNOSIS — I4891 Unspecified atrial fibrillation: Secondary | ICD-10-CM

## 2022-01-28 HISTORY — PX: CARDIOVERSION: SHX1299

## 2022-01-28 LAB — BASIC METABOLIC PANEL
Anion gap: 8 (ref 5–15)
BUN: 23 mg/dL — ABNORMAL HIGH (ref 6–20)
CO2: 26 mmol/L (ref 22–32)
Calcium: 9 mg/dL (ref 8.9–10.3)
Chloride: 106 mmol/L (ref 98–111)
Creatinine, Ser: 1.05 mg/dL (ref 0.61–1.24)
GFR, Estimated: >60 mL/min (ref >60)
Glucose, Bld: 104 mg/dL — ABNORMAL HIGH (ref 70–99)
Potassium: 4.1 mmol/L (ref 3.5–5.1)
Sodium: 140 mmol/L (ref 135–145)

## 2022-01-28 LAB — GLUCOSE, CAPILLARY
Glucose-Capillary: 95 mg/dL (ref 70–99)
Glucose-Capillary: 92 mg/dL (ref 70–99)
Glucose-Capillary: 132 mg/dL — ABNORMAL HIGH (ref 70–99)

## 2022-01-28 LAB — MAGNESIUM: Magnesium: 2.1 mg/dL (ref 1.7–2.4)

## 2022-01-28 SURGERY — CARDIOVERSION
Anesthesia: General

## 2022-01-28 MED ORDER — PROPOFOL 10 MG/ML IV BOLUS
INTRAVENOUS | Status: DC | PRN
  Administered 2022-01-28: 40 mg via INTRAVENOUS
  Administered 2022-01-28: 60 mg via INTRAVENOUS
  Administered 2022-01-28: 30 mg via INTRAVENOUS

## 2022-01-28 MED ORDER — LIDOCAINE HCL (CARDIAC) PF 100 MG/5ML IV SOSY
PREFILLED_SYRINGE | INTRAVENOUS | Status: DC | PRN
  Administered 2022-01-28: 60 mg via INTRAVENOUS

## 2022-01-28 NOTE — Interval H&P Note (Signed)
History and Physical Interval Note:  01/28/2022 10:47 AM  Philip [name]  has presented today for surgery, with the diagnosis of afib.  The various methods of treatment have been discussed with the patient and family. After consideration of risks, benefits and other options for treatment, the patient has consented to  Procedure(s): CARDIOVERSION (N/A) as a surgical intervention.  The patient's history has been reviewed, patient examined, no change in status, stable for surgery.  I have reviewed the patient's chart and labs.  Questions were answered to the patient's satisfaction.     Pixie Casino

## 2022-01-28 NOTE — Progress Notes (Signed)
Pharmacy: Dofetilide (Tikosyn) - Follow Up Assessment and Electrolyte Replacement  Pharmacy consulted to assist in monitoring and replacing electrolytes in this 61 y.o. male admitted on 01/26/2022 undergoing dofetilide re-initiation. First dofetilide dose: 01/26/22  Labs:    Component Value Date/Time   K 4.1 01/28/2022 0149   MG 2.1 01/28/2022 0149     Plan: Potassium: K >/= 4: No additional supplementation needed  Magnesium: Mg > 2: No additional supplementation needed   Thank you for allowing pharmacy to participate in this patient's care   Einar Grad 01/28/2022  7:10 AM

## 2022-01-28 NOTE — Progress Notes (Signed)
Mobility Specialist - Progress Note   01/28/22 1445  Mobility  Activity Ambulated with assistance in hallway  Level of Assistance Independent  Assistive Device None  Distance Ambulated (ft) 300 ft  Activity Response Tolerated well  Mobility Referral No  $Mobility charge 1 Mobility   Pt received at nurse station and agreeable to session. No c/o throughout session. Pt returned to room with all needs mets.   Larey Seat

## 2022-01-28 NOTE — Progress Notes (Signed)
Rounding Note    Patient Name: Philip [name] Date of Encounter: 01/28/2022  [place] Cardiologist: Minus Breeding, MD   Subjective   Feels well, HA is resolved  Inpatient Medications    Scheduled Meds:  dofetilide  125 mcg Oral BID   ferrous sulfate  325 mg Oral QPM   furosemide  40 mg Oral Daily   linagliptin  5 mg Oral Q supper   And   metFORMIN  1,000 mg Oral BID WC   loratadine  10 mg Oral Daily   losartan  25 mg Oral QPM   magnesium oxide  400 mg Oral Daily   metoprolol succinate  50 mg Oral Daily   pantoprazole  40 mg Oral Daily   potassium chloride SA  20 mEq Oral Daily   rivaroxaban  20 mg Oral Q supper   simvastatin  10 mg Oral QPM   sodium chloride flush  3 mL Intravenous Q12H   Continuous Infusions:  sodium chloride     sodium chloride     PRN Meds: sodium chloride, acetaminophen, sodium chloride flush   Vital Signs    Vitals:   01/27/22 0851 01/27/22 1100 01/27/22 2019 01/28/22 0515  BP: 109/82 109/81 117/77 108/73  Pulse:  98  77  Resp:  18 18 16   Temp:  97.9 F (36.6 C) 97.6 F (36.4 C) 98.1 F (36.7 C)  TempSrc:  Oral Oral Oral  SpO2:  100%    Weight:   124.3 kg   Height:   6\' 4"  (1.93 m)     Intake/Output Summary (Last 24 hours) at 01/28/2022 0816 Last data filed at 01/27/2022 2015 Gross per 24 hour  Intake 243 ml  Output --  Net 243 ml      01/27/2022    8:19 PM 01/26/2022    8:45 AM 01/18/2022    2:10 PM  Last 3 Weights  Weight (lbs) 274 lb 274 lb 270 lb  Weight (kg) 124.286 kg 124.286 kg 122.471 kg      Telemetry    AFib rates 80's mostly - Personally Reviewed  ECG    AFib 78, agree with machine read on his EKG QTc 448ms, perhaps even shorter - Personally Reviewed  Physical Exam   Unchanged exam GEN: No acute distress.   Neck: No JVD Cardiac: irreg-irreg, no murmurs, rubs, or gallops.  Respiratory: CTA b/l. GI: Soft, nontender, non-distended  MS: No edema; No deformity. Neuro:  Nonfocal  Psych:  Normal affect   Labs    High Sensitivity Troponin:  No results for input(s): "TROPONINIHS" in the last 720 hours.   Chemistry Recent Labs  Lab 01/26/22 0835 01/27/22 0718 01/28/22 0149  NA 140 139 140  K 4.0 4.1 4.1  CL 107 106 106  CO2 25 27 26   GLUCOSE 128* 99 104*  BUN 22* 21* 23*  CREATININE 1.20 1.07 1.05  CALCIUM 9.0 9.3 9.0  MG 2.3 2.1 2.1  GFRNONAA >60 >60 >60  ANIONGAP 8 6 8     Lipids No results for input(s): "CHOL", "TRIG", "HDL", "LABVLDL", "LDLCALC", "CHOLHDL" in the last 168 hours.  HematologyNo results for input(s): "WBC", "RBC", "HGB", "HCT", "MCV", "MCH", "MCHC", "RDW", "PLT" in the last 168 hours. Thyroid No results for input(s): "TSH", "FREET4" in the last 168 hours.  BNPNo results for input(s): "BNP", "PROBNP" in the last 168 hours.  DDimer No results for input(s): "DDIMER" in the last 168 hours.   Radiology    No results found.  Cardiac Studies   10/43/2022: TTE  1. Left ventricular ejection fraction, by estimation, is 60 to 65%. The  left ventricle has normal function. The left ventricle has no regional  wall motion abnormalities. There is mild asymmetric left ventricular  hypertrophy of the basal and septal  segments. Left ventricular diastolic parameters are indeterminate.   2. Right ventricular systolic function is normal. The right ventricular  size is normal.   3. Left atrial size was moderately dilated.   4. The mitral valve is abnormal. Mild mitral valve regurgitation. No  evidence of mitral stenosis.   5. Tricuspid valve regurgitation is mild to moderate.   6. The aortic valve is tricuspid. There is moderate calcification of the  aortic valve. There is moderate thickening of the aortic valve. Aortic  valve regurgitation is not visualized. Sclerosis without stenosis.   7. The inferior vena cava is normal in size with greater than 50%  respiratory variability, suggesting right atrial pressure of 3 mmHg.   Patient Profile     61 y.o.  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), OSA, HTN, HLD, obesity (s/p bariatric surgery/gastric bypass remotely), chronic CHF (HFpEF), DM, GIB (chart mentions possible AVMs), persistent AFib admitted for Tikosyn re-load  PVI ablation (dec 2022)  Assessment & Plan    Persistent AFib CHA2DS2Vasc is 3, on Xarelto Tikosyn load is in progress K+ 4.1 Mag 2.1 Creat 1.05 QTc looks improved at last night post dose EKG Continue this AM and will reassess after his DCCV   VHD Remote AVR (bioprosthetic) and MV repair Stable findings on his echo last year  HTN Home meds  DM Home meds  Chronic CHF (HFpEF) Not overtly volume OL currently  For questions or updates, please contact [facility] Please consult [email or web address] for contact info under        Signed, Sheilah Pigeon, PA-C  01/28/2022, 8:16 AM

## 2022-01-28 NOTE — Care Management (Signed)
01-28-22 1319 Case Manager spoke with the patient regarding Tikosyn cost. Patient is agreeable to cost and wants his initial Rx sent to [facility] and Rx refills for 90 day supply escribed to [facility]. No further needs identified at this time.

## 2022-01-28 NOTE — Transfer of Care (Signed)
Immediate Anesthesia Transfer of Care Note  Patient: Philip [name]  Procedure(s) Performed: CARDIOVERSION  Patient Location: PACU  Anesthesia Type:MAC  Level of Consciousness: drowsy  Airway & Oxygen Therapy: Patient Spontanous Breathing and Patient connected to nasal cannula oxygen  Post-op Assessment: Report given to RN and Post -op Vital signs reviewed and stable  Post vital signs: Reviewed and stable  Last Vitals:  Vitals Value Taken Time  BP    Temp    Pulse    Resp    SpO2      Last Pain:  Vitals:   01/28/22 1046  TempSrc: Temporal  PainSc: 0-No pain      Patients Stated Pain Goal: 0 (16/10/96 0454)  Complications: No notable events documented.

## 2022-01-28 NOTE — CV Procedure (Signed)
    CARDIOVERSION NOTE  Procedure: Electrical Cardioversion Indications:  Atrial Fibrillation  Procedure Details:  Consent: Risks of procedure as well as the alternatives and risks of each were explained to the (patient/caregiver).  Consent for procedure obtained.  Time Out: Verified patient identification, verified procedure, site/side was marked, verified correct patient position, special equipment/implants available, medications/allergies/relevent history reviewed, required imaging and test results available.  Performed  Patient placed on cardiac monitor, pulse oximetry, supplemental oxygen as necessary.  Sedation given:  propofol per anesthesia Pacer pads placed anterior and posterior chest.  Cardioverted 2 time(s).  Cardioverted at 200J biphasic x 2.  Impression: Findings: Post procedure EKG shows: NSR Complications: None Patient did tolerate procedure well.  Plan: Successful DCCV to NSR after 2 stacked shocks - he initially shocked into atrial flutter at 100, but the second shock converted to NSR in the 60's.  Time Spent Directly with the Patient:  30 minutes   Pixie Casino, MD, [facility], [place] Director of the [facility] Lipid Disorders &  Cardiovascular Risk Reduction Clinic Diplomate of the American Board of Clinical Lipidology Attending Cardiologist  Direct Dial: [phone]  Fax: [phone]  Website:  [email or web address] Osgood [place] 01/28/2022, 11:33 AM

## 2022-01-28 NOTE — Progress Notes (Addendum)
Pt's BP is 93/64 after holding dose of [place]. Pt is asymptomatic. Notified MD. No new orders at this time.

## 2022-01-28 NOTE — Progress Notes (Signed)
BP=100/71, spoke to [employer], [place]. She stated to hold dose of losartan due to borderline BP.

## 2022-01-28 NOTE — Anesthesia Postprocedure Evaluation (Signed)
Anesthesia Post Note  Patient: Philip [name]  Procedure(s) Performed: CARDIOVERSION     Patient location during evaluation: Endoscopy Anesthesia Type: General Level of consciousness: awake and sedated Pain management: pain level controlled Vital Signs Assessment: post-procedure vital signs reviewed and stable Respiratory status: spontaneous breathing Cardiovascular status: stable Postop Assessment: no apparent nausea or vomiting Anesthetic complications: no   No notable events documented.  Last Vitals:  Vitals:   01/28/22 1155 01/28/22 1227  BP: 97/73 117/78  Pulse: 64 70  Resp: (!) 23 16  Temp:  36.5 C  SpO2: 98%     Last Pain:  Vitals:   01/28/22 1227  TempSrc: Oral  PainSc:                  Huston Foley

## 2022-01-28 NOTE — Anesthesia Preprocedure Evaluation (Signed)
Anesthesia Evaluation  Patient identified by MRN, date of birth, ID band Patient awake    Reviewed: Allergy & Precautions, NPO status , Patient's Chart, lab work & pertinent test results  Airway Mallampati: II  TM Distance: >3 FB     Dental no notable dental hx.    Pulmonary sleep apnea and Continuous Positive Airway Pressure Ventilation , former smoker,    breath sounds clear to auscultation       Cardiovascular hypertension, Pt. on medications and Pt. on home beta blockers  Rhythm:Regular Rate:Normal     Neuro/Psych    GI/Hepatic Neg liver ROS, PUD, GERD  ,  Endo/Other  diabetes, Type 2, Oral Hypoglycemic Agents  Renal/GU negative Renal ROS     Musculoskeletal  (+) Arthritis ,   Abdominal (+) + obese,   Peds  Hematology  (+) Blood dyscrasia, anemia ,   Anesthesia Other Findings   Reproductive/Obstetrics                             Anesthesia Physical  Anesthesia Plan  ASA: 3  Anesthesia Plan: General   Post-op Pain Management:    Induction: Intravenous  PONV Risk Score and Plan: 3 and Treatment may vary due to age or medical condition, Propofol infusion and TIVA  Airway Management Planned: Natural Airway and Nasal Cannula  Additional Equipment: None  Intra-op Plan:   Post-operative Plan: Extubation in OR  Informed Consent: I have reviewed the patients History and Physical, chart, labs and discussed the procedure including the risks, benefits and alternatives for the proposed anesthesia with the patient or authorized representative who has indicated his/her understanding and acceptance.     Dental advisory given  Plan Discussed with: Anesthesiologist and CRNA  Anesthesia Plan Comments:         Anesthesia Quick Evaluation

## 2022-01-29 ENCOUNTER — Other Ambulatory Visit (HOSPITAL_COMMUNITY): Payer: Self-pay

## 2022-01-29 LAB — MAGNESIUM: Magnesium: 2.3 mg/dL (ref 1.7–2.4)

## 2022-01-29 LAB — BASIC METABOLIC PANEL
Anion gap: 8 (ref 5–15)
BUN: 26 mg/dL — ABNORMAL HIGH (ref 6–20)
CO2: 27 mmol/L (ref 22–32)
Calcium: 9 mg/dL (ref 8.9–10.3)
Chloride: 104 mmol/L (ref 98–111)
Creatinine, Ser: 1.16 mg/dL (ref 0.61–1.24)
GFR, Estimated: >60 mL/min (ref >60)
Glucose, Bld: 105 mg/dL — ABNORMAL HIGH (ref 70–99)
Potassium: 4.4 mmol/L (ref 3.5–5.1)
Sodium: 139 mmol/L (ref 135–145)

## 2022-01-29 LAB — GLUCOSE, CAPILLARY
Glucose-Capillary: 98 mg/dL (ref 70–99)
Glucose-Capillary: 98 mg/dL (ref 70–99)

## 2022-01-29 MED ORDER — SILVER SULFADIAZINE 1 % EX CREA
TOPICAL_CREAM | Freq: Two times a day (BID) | CUTANEOUS | Status: DC
  Filled 2022-01-29: qty 85

## 2022-01-29 MED ORDER — HYDROCORTISONE 1 % EX CREA
TOPICAL_CREAM | Freq: Two times a day (BID) | CUTANEOUS | Status: DC | PRN
  Filled 2022-01-29: qty 28

## 2022-01-29 MED ORDER — DOFETILIDE 125 MCG PO CAPS
125.0000 ug | ORAL_CAPSULE | Freq: Two times a day (BID) | ORAL | 5 refills | Status: DC
  Filled 2022-01-29: qty 60, 30d supply, fill #0

## 2022-01-29 NOTE — Discharge Summary (Addendum)
ELECTROPHYSIOLOGY PROCEDURE DISCHARGE SUMMARY    Patient ID: Philip [name],  MRN: [id number], DOB/AGE: 1960-11-15 61 y.o.  Admit date: 01/26/2022 Discharge date: 01/29/2022  Primary Care Physician: Lowella Petties, MD  Primary Cardiologist: Dr. Percival Spanish Electrophysiologist: Dr. Myles Gip  Primary Discharge Diagnosis:  1.  persistent atrial fibrillation status post Tikosyn loading this admission      CHA2DS2Vasc is 3, on Xarelto  Secondary Discharge Diagnosis:  VHD H/o AVR (bioprosthetic), MV repair HTN DM Chronic CHF (HFpEF) Compensated  Allergies  Allergen Reactions   Penicillins Hives, Swelling and Other (See Comments)    NO REACTION LISTED Has patient had a PCN reaction causing immediate rash, facial/tongue/throat swelling, SOB or lightheadedness with hypotension: Yes Has patient had a PCN reaction causing severe rash involving mucus membranes or skin necrosis: No Has patient had a PCN reaction that required hospitalization No Has patient had a PCN reaction occurring within the last 10 years: No If all of the above answers are "NO", then may proceed with Cephalosporin use.  Has taken Keflex w/o issue         Procedures This Admission:  1.  Tikosyn loading 2.  Direct current cardioversion on 01/28/22 by Dr Debara Pickett which successfully restored SR.  There were no early apparent complications.   Brief HPI: Philip [name] is a 61 y.o. male with a past medical history as noted above.  He is followed by EP in the outpatient setting for treatment his atrial fibrillation.  Previously well on controlled on Tikosyn and subsequently stopped at pt request.  Though with recurrent Afib planned for restart.  Hospital Course:  The patient was admitted and Tikosyn was initiated.  Renal function and electrolytes were followed during the hospitalization.  The patient's QTc did lengthen some and dose reduced, subsequently remained stable.  On 01/28/22 the patient underwent direct  current cardioversion which restored sinus rhythm.  He was monitored until discharge on telemetry which demonstrated SR.  On the day of discharge, he feels well, was examined by Dr Myles Gip who considered the patient stable for discharge to home.  Follow-up has been arranged with the AFib clinic next week and EP service in 4 weeks.   Tikosyn teaching was completed No new or additional electrolyte replacement for home  D/w pharmacy, metolazone is OK   Physical Exam: Vitals:   01/28/22 2015 01/29/22 0111 01/29/22 0630 01/29/22 0833  BP: 93/64 102/69 115/72 107/68  Pulse:  61 62 71  Resp:      Temp:   (!) 97.3 F (36.3 C)   TempSrc:   Oral   SpO2:  97%    Weight:      Height:        GEN- The patient is well appearing, alert and oriented x 3 today.   HEENT: normocephalic, atraumatic; sclera clear, conjunctiva pink; hearing intact; oropharynx clear; neck supple, no JVP Lymph- no cervical lymphadenopathy Lungs- CTA b/l, normal work of breathing.  No wheezes, rales, rhonchi Heart- RRR, no murmurs, rubs or gallops, PMI not laterally displaced GI- soft, non-tender, non-distended Extremities- no clubbing, cyanosis, or edema MS- no significant deformity or atrophy Skin- warm and dry, no rash or lesion Psych- euthymic mood, full affect Neuro- strength and sensation are intact   Labs:   Lab Results  Component Value Date   WBC 8.4 07/09/2021   HGB 12.4 (L) 07/09/2021   HCT 36.9 (L) 07/09/2021   MCV 91 07/09/2021   PLT 217 07/09/2021  Recent Labs  Lab 01/29/22 0314  NA 139  K 4.4  CL 104  CO2 27  BUN 26*  CREATININE 1.16  CALCIUM 9.0  GLUCOSE 105*     Discharge Medications:  Allergies as of 01/29/2022       Reactions   Penicillins Hives, Swelling, Other (See Comments)   NO REACTION LISTED Has patient had a PCN reaction causing immediate rash, facial/tongue/throat swelling, SOB or lightheadedness with hypotension: Yes Has patient had a PCN reaction causing severe  rash involving mucus membranes or skin necrosis: No Has patient had a PCN reaction that required hospitalization No Has patient had a PCN reaction occurring within the last 10 years: No If all of the above answers are "NO", then may proceed with Cephalosporin use. Has taken Keflex w/o issue        Medication List     TAKE these medications    Accu-Chek FastClix Lancets Misc 2 (two) times daily. as directed   Accu-Chek Guide test strip Generic drug: glucose blood 2 (two) times daily.   CALCIUM 600+D3 PO Take 1 tablet by mouth in the morning and at bedtime.   cetirizine 10 MG tablet Commonly known as: ZYRTEC Take 10 mg by mouth daily.   clindamycin 150 MG capsule Commonly known as: CLEOCIN Take 600 mg by mouth See admin instructions. Take 4 tablets by mouth 1 hour prior to seeing the dentist   dofetilide 125 MCG capsule Commonly known as: TIKOSYN Take 1 capsule (125 mcg total) by mouth 2 (two) times daily.   esomeprazole 40 MG capsule Commonly known as: NEXIUM Take 40 mg by mouth every evening.   ferrous sulfate 325 (65 FE) MG tablet Take 325 mg by mouth every evening.   furosemide 40 MG tablet Commonly known as: LASIX Take daily as needed for edema or shortness of breath   Klor-Con M20 20 MEQ tablet Generic drug: potassium chloride SA TAKE 1 TABLET DAILY, AND 1 EXTRA TABLET ON DAYS       TAKING  METOLAZONE. What changed: See the new instructions.   losartan 25 MG tablet Commonly known as: COZAAR Take 25 mg by mouth every evening.   magnesium oxide 400 (240 Mg) MG tablet Commonly known as: MAG-OX Take 400 mg by mouth daily.   metolazone 10 MG tablet Commonly known as: ZAROXOLYN Take by mouth.   metoprolol succinate 25 MG 24 hr tablet Commonly known as: Toprol XL Take 2 tablets (50 mg total) by mouth daily. What changed: how much to take   sildenafil 100 MG tablet Commonly known as: VIAGRA Take 100 mg by mouth as needed.   simvastatin 10 MG  tablet Commonly known as: ZOCOR Take 10 mg by mouth every evening.   sitaGLIPtin-metformin 50-1000 MG tablet Commonly known as: JANUMET Take 1 tablet by mouth 2 (two) times daily with a meal.   tadalafil 20 MG tablet Commonly known as: CIALIS Take 20 mg by mouth as needed.   vitamin C 1000 MG tablet Take 1,000 mg by mouth in the morning.   Xarelto 20 MG Tabs tablet Generic drug: rivaroxaban TAKE 1 TABLET DAILY WITH   SUPPER What changed: See the new instructions.        Disposition: Home    Duration of Discharge Encounter: Greater than 30 minutes including physician time.  Norma Fredrickson, PA-C 01/29/2022 12:38 PM

## 2022-01-29 NOTE — Progress Notes (Signed)
Pharmacy: Dofetilide (Tikosyn) - Follow Up Assessment and Electrolyte Replacement  Pharmacy consulted to assist in monitoring and replacing electrolytes in this 61 y.o. male admitted on 01/26/2022 undergoing dofetilide re-initiation. First dofetilide dose: 01/26/22  Labs:    Component Value Date/Time   K 4.4 01/29/2022 0314   MG 2.3 01/29/2022 0314     Plan: Potassium: K >/= 4: No additional supplementation needed  Magnesium: Mg > 2: No additional supplementation needed  **Pt has been taking KCl 20 mEq daily as PTA - recommend continuing this.  Thank you for allowing pharmacy to participate in this patient's care   Einar Grad 01/29/2022  7:16 AM

## 2022-02-01 ENCOUNTER — Ambulatory Visit (HOSPITAL_COMMUNITY): Payer: 59 | Admitting: Nurse Practitioner

## 2022-02-02 ENCOUNTER — Encounter (HOSPITAL_COMMUNITY): Payer: Self-pay | Admitting: Internal Medicine

## 2022-02-05 ENCOUNTER — Encounter (HOSPITAL_COMMUNITY): Payer: Self-pay | Admitting: Physician Assistant

## 2022-02-05 ENCOUNTER — Ambulatory Visit (HOSPITAL_COMMUNITY)
Admit: 2022-02-05 | Discharge: 2022-02-05 | Disposition: A | Discharge status: Home / Self Care | Payer: 59 | Attending: Nurse Practitioner | Admitting: Nurse Practitioner

## 2022-02-05 VITALS — BP 118/88 | HR 75 | Ht 76.0 in | Wt 274.4 lb

## 2022-02-05 DIAGNOSIS — Z953 Presence of xenogenic heart valve: Secondary | ICD-10-CM | POA: Diagnosis not present

## 2022-02-05 DIAGNOSIS — I4819 Other persistent atrial fibrillation: Secondary | ICD-10-CM | POA: Insufficient documentation

## 2022-02-05 DIAGNOSIS — I10 Essential (primary) hypertension: Secondary | ICD-10-CM | POA: Diagnosis not present

## 2022-02-05 DIAGNOSIS — Z7901 Long term (current) use of anticoagulants: Secondary | ICD-10-CM | POA: Insufficient documentation

## 2022-02-05 DIAGNOSIS — K922 Gastrointestinal hemorrhage, unspecified: Secondary | ICD-10-CM | POA: Insufficient documentation

## 2022-02-05 DIAGNOSIS — D6869 Other thrombophilia: Secondary | ICD-10-CM | POA: Diagnosis not present

## 2022-02-05 LAB — MAGNESIUM: Magnesium: 2.1 mg/dL (ref 1.7–2.4)

## 2022-02-05 LAB — BASIC METABOLIC PANEL
Anion gap: 14 (ref 5–15)
BUN: 24 mg/dL — ABNORMAL HIGH (ref 6–20)
CO2: 23 mmol/L (ref 22–32)
Calcium: 9.4 mg/dL (ref 8.9–10.3)
Chloride: 107 mmol/L (ref 98–111)
Creatinine, Ser: 1.37 mg/dL — ABNORMAL HIGH (ref 0.61–1.24)
GFR, Estimated: 59 mL/min — ABNORMAL LOW (ref >60)
Glucose, Bld: 84 mg/dL (ref 70–99)
Potassium: 4 mmol/L (ref 3.5–5.1)
Sodium: 144 mmol/L (ref 135–145)

## 2022-02-05 MED ORDER — DOFETILIDE 125 MCG PO CAPS: 125.0000 ug | ORAL_CAPSULE | Freq: Two times a day (BID) | ORAL | 1 refills | Status: DC

## 2022-02-05 NOTE — Progress Notes (Signed)
Primary Care Physician: Loni Beckwith, MD Referring Physician:  Dr. Mable Paris Philip [name] is a 61 y.o. 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 Pt had been staying in SR for a long period of time on Tikosyn 250 mcg bid and talked [name] into stopping drug  in June since he was doing so well. Unfortunately, he went back into afib in September.He was in [place] at the time. He was cardioverted in the local ER but AF recurred about 12 hours later. He saw [name] 10/2 and decided to reload tikosyn. He was on 250 mcg in the past for borderline qt. Qt today is 499 in afib. No benadryl use and no missed anticoagulation. Pt is not taking any contraindicated drugs, as reviewed by PharmD.   Follow up in the AF clinic 02/05/22. Patient is s/p dofetilide loading 10/10-10/13/23 with DCCV on 01/28/22. Patient remains in SR and feels well today. He denies any bleeding issues on anticoagulation.   Today, he denies symptoms of palpitations, chest pain, shortness of breath, orthopnea, PND, lower extremity edema, dizziness, presyncope, syncope, or neurologic sequela. The patient is tolerating medications without difficulties and is otherwise without complaint today.   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   Hx of bacterial endocarditis 11/2003   Hypertension    Neuropathy    feet bilat    Obesity    Persistent atrial fibrillation (HCC)    Sleep apnea    uses CPAP nightly   Toe deformity 08/2011    right claw hallux   Past Surgical History:  Procedure Laterality Date   adenoid surgery      AORTIC VALVE REPLACEMENT  11/2003   ATRIAL FIBRILLATION ABLATION N/A 03/19/2021   Procedure: ATRIAL FIBRILLATION ABLATION;  Surgeon: [name], MD;  Location: [facility] INVASIVE CV LAB;  Service: Cardiovascular;  Laterality: N/A;   CARDIOVERSION N/A 07/25/2017   Procedure: CARDIOVERSION;  Surgeon: Chilton Si, MD;  Location: [facility] ENDOSCOPY;  Service: Cardiovascular;  Laterality: N/A;   CARDIOVERSION N/A 09/07/2017   Procedure: CARDIOVERSION;  Surgeon: Chrystie Nose, MD;  Location: [facility] ENDOSCOPY;  Service: Cardiovascular;  Laterality: N/A;   CARDIOVERSION N/A 09/29/2017   Procedure: CARDIOVERSION;  Surgeon: Thurmon Fair, MD;  Location: [facility] ENDOSCOPY;  Service: Cardiovascular;  Laterality: N/A;   CARDIOVERSION N/A 12/26/2020   Procedure: CARDIOVERSION;  Surgeon: Wendall Stade, MD;  Location: [facility] ENDOSCOPY;  Service: Cardiovascular;  Laterality: N/A;   CARDIOVERSION N/A 01/28/2022   Procedure: CARDIOVERSION;  Surgeon: Chrystie Nose, MD;  Location: [facility] ENDOSCOPY;  Service: Cardiovascular;  Laterality: N/A;   COLONOSCOPY WITH PROPOFOL N/A 03/30/2017   Procedure: COLONOSCOPY WITH PROPOFOL;  Surgeon: Benancio Deeds, MD;  Location: [facility] ENDOSCOPY;  Service: Gastroenterology;  Laterality: N/A;   ESOPHAGOGASTRODUODENOSCOPY (EGD) WITH PROPOFOL N/A 03/29/2017   Procedure: ESOPHAGOGASTRODUODENOSCOPY (EGD) WITH PROPOFOL;  Surgeon: Benancio Deeds, MD;  Location: [facility] ENDOSCOPY;  Service: Gastroenterology;  Laterality: N/A;   GASTRIC BYPASS  03/2004   GIVENS CAPSULE STUDY N/A 03/30/2017   Procedure: GIVENS CAPSULE STUDY;  Surgeon: Yetta Flock, MD;  Location: [facility] ENDOSCOPY;  Service: Gastroenterology;  Laterality: N/A;   KNEE ARTHROSCOPY  08/09/2007 - left   07/15/2003 - right   METATARSAL OSTEOTOMY  03/20/2010   right 1st MT; gastroc soleus recession   NASAL SINUS SURGERY     TENDON RELEASE   09/09/2011   Procedure: HEEL CORD LENGTHENING;  Surgeon: Wylene Simmer, MD;  Location: [place];  Service: Orthopedics;  Laterality: Right;  Righ tachilles tendon lengthening   TOE FUSION  07/12/2005   left 3rd toe PIP and DIP fusion   TONSILLECTOMY AND ADENOIDECTOMY     TOTAL KNEE ARTHROPLASTY Left 03/18/2015   Procedure: LEFT TOTAL KNEE ARTHROPLASTY;  Surgeon: Paralee Cancel, MD;  Location: [facility] ORS;  Service: Orthopedics;  Laterality: Left;    Current Outpatient Medications  Medication Sig Dispense Refill   Accu-Chek FastClix Lancets MISC 2 (two) times daily. as directed     ACCU-CHEK GUIDE test strip 2 (two) times daily.     Ascorbic Acid (VITAMIN C) 1000 MG tablet Take 1,000 mg by mouth in the morning.     Calcium Carb-Cholecalciferol (CALCIUM 600+D3 PO) Take 1 tablet by mouth in the morning and at bedtime.     cetirizine (ZYRTEC) 10 MG tablet Take 10 mg by mouth daily.      clindamycin (CLEOCIN) 150 MG capsule Take 600 mg by mouth See admin instructions. Take 4 tablets by mouth 1 hour prior to seeing the dentist     esomeprazole (NEXIUM) 40 MG capsule Take 40 mg by mouth every evening.      ferrous sulfate 325 (65 FE) MG tablet Take 325 mg by mouth every evening.     furosemide (LASIX) 40 MG tablet Take daily as needed for edema or shortness of breath 30 tablet 6   KLOR-CON M20 20 MEQ tablet TAKE 1 TABLET DAILY, AND 1 EXTRA TABLET ON DAYS       TAKING  METOLAZONE. 95 tablet 2   losartan (COZAAR) 25 MG tablet Take 25 mg by mouth every evening.      magnesium oxide (MAG-OX) 400 (240 Mg) MG tablet Take 400 mg by mouth daily.     metolazone (ZAROXOLYN) 10 MG tablet Take by mouth.     metoprolol succinate (TOPROL XL) 25 MG 24 hr tablet Take 2 tablets (50 mg total) by mouth daily. 90 tablet 3   sildenafil (VIAGRA) 100 MG tablet Take 100 mg by mouth as needed.     simvastatin (ZOCOR) 10 MG tablet Take 10 mg by mouth every evening.      sitaGLIPtin-metformin (JANUMET) 50-1000 MG  tablet Take 1 tablet by mouth 2 (two) times daily with a meal.      tadalafil (CIALIS) 20 MG tablet Take 20 mg by mouth as needed.     XARELTO 20 MG TABS tablet TAKE 1 TABLET DAILY WITH   SUPPER 90 tablet 3   dofetilide (TIKOSYN) 125 MCG capsule Take 1 capsule (125 mcg total) by mouth 2 (two) times daily. 180 capsule 1   No current facility-administered medications for this encounter.    Allergies  Allergen Reactions   Penicillins Hives, Swelling and Other (See Comments)    NO REACTION LISTED Has patient had a PCN reaction causing immediate rash, facial/tongue/throat swelling, SOB or lightheadedness with hypotension: Yes Has patient had a PCN  reaction causing severe rash involving mucus membranes or skin necrosis: No Has patient had a PCN reaction that required hospitalization No Has patient had a PCN reaction occurring within the last 10 years: No If all of the above answers are "NO", then may proceed with Cephalosporin use.  Has taken Keflex w/o issue        Social History   Socioeconomic History   Marital status: Widowed    Spouse name: Not on file   Number of children: Not on file   Years of education: Not on file   Highest education level: Not on file  Occupational History   Not on file  Tobacco Use   Smoking status: Former    Packs/day: 1.00    Years: 2.00    Total pack years: 2.00    Types: Cigars, Pipe, Cigarettes    Quit date: 01/31/2011    Years since quitting: 11.0   Smokeless tobacco: Never   Tobacco comments:    Former smoker (01/01/2021)  Vaping Use   Vaping Use: Never used  Substance and Sexual Activity   Alcohol use: Yes    Alcohol/week: 2.0 standard drinks of alcohol    Types: 2 Standard drinks or equivalent per week    Comment: 2 drinks a month 01/26/22   Drug use: No   Sexual activity: Not on file  Other Topics Concern   Not on file  Social History Narrative   Not on file   Social Determinants of Health   Financial Resource Strain: Not  on file  Food Insecurity: No Food Insecurity (01/27/2022)   Hunger Vital Sign    Worried About Running Out of Food in the Last Year: Never true    Ran Out of Food in the Last Year: Never true  Transportation Needs: No Transportation Needs (01/27/2022)   PRAPARE - Hydrologist (Medical): No    Lack of Transportation (Non-Medical): No  Physical Activity: Not on file  Stress: Not on file  Social Connections: Not on file  Intimate Partner Violence: Not At Risk (01/27/2022)   Humiliation, Afraid, Rape, and Kick questionnaire    Fear of Current or Ex-Partner: No    Emotionally Abused: No    Physically Abused: No    Sexually Abused: No    Family History  Problem Relation Age of Onset   Alzheimer's disease Mother    Diabetes Father    Colon cancer Unknown        family history    ROS- All systems are reviewed and negative except as per the HPI above  Physical Exam: Vitals:   02/05/22 0826  BP: 118/88  Pulse: 75  Weight: 124.5 kg  Height: 6\' 4"  (1.93 m)    Wt Readings from Last 3 Encounters:  02/05/22 124.5 kg  01/27/22 124.3 kg  01/26/22 124.3 kg    Labs: Lab Results  Component Value Date   NA 139 01/29/2022   K 4.4 01/29/2022   CL 104 01/29/2022   CO2 27 01/29/2022   GLUCOSE 105 (H) 01/29/2022   BUN 26 (H) 01/29/2022   CREATININE 1.16 01/29/2022   CALCIUM 9.0 01/29/2022   MG 2.3 01/29/2022   Lab Results  Component Value Date   INR 1.3 (H) 07/14/2017   No results found for: "CHOL", "HDL", "LDLCALC", "TRIG"   GEN- The patient is a well appearing male, alert and oriented x 3 today.   HEENT-head normocephalic, atraumatic, sclera clear, conjunctiva pink, hearing intact, trachea midline.  Lungs- Clear to ausculation bilaterally, normal work of breathing Heart- Regular rate and rhythm, no murmurs, rubs or gallops  GI- soft, NT, ND, + BS Extremities- no clubbing, cyanosis, or edema MS- no significant deformity or atrophy Skin- no  rash or lesion Psych- euthymic mood, full affect Neuro- strength and sensation are intact   EKG-  SR Vent. rate 75 BPM PR interval * ms QRS duration 112 ms QT/QTcB 442/493 ms    Assessment and Plan:  1. Recurrent persistent afib S/p dofetilide reload 10/10-10/13/23 with DCCV on 01/28/22. Patient appears to be maintaining SR. Continue dofetilide 125 mcg BID. QT stable. Check bmet/mag today Continue Toprol 50 mg daily Continue Xarelto 20 mg daily  2. CHA2DS2VASc score of at least 5  Continue Xarelto 20 mg daily  3. HTN Stable, no changes today.   Follow up with Dr Myles Gip and the AF clinic as scheduled.    [facility] [address] [place], [place] [address] [phone]

## 2022-03-04 ENCOUNTER — Ambulatory Visit: Payer: 59 | Admitting: Cardiovascular Disease

## 2022-03-09 ENCOUNTER — Other Ambulatory Visit (HOSPITAL_COMMUNITY): Payer: Self-pay | Admitting: Nurse Practitioner

## 2022-03-15 ENCOUNTER — Ambulatory Visit: Payer: 59 | Admitting: Cardiovascular Disease

## 2022-03-26 IMAGING — CT CT HEART MORPH/PULM VEIN W/ CM & W/O CA SCORE
3 of 8 series · 9 of 20 positions shown, 10 images · non-contrast
Comparison: 12/04/2019
COMPARISON: 12/04/2019

Addendum:
EXAM:
OVER-READ INTERPRETATION  CT CHEST

The following report is an over-read performed by radiologist Dr.
Blain Jumper [REDACTED] on 03/13/2021. This
over-read does not include interpretation of cardiac or coronary
anatomy or pathology. The coronary CTA interpretation by the
cardiologist is attached.
CLINICAL DATA: Atrial fibrillation scheduled for ablation.
Cardiac CTA
TECHNIQUE: A non-contrast, gated CT scan was obtained with axial slices of 3 mm
through the heart for calcium scoring. Calcium scoring was performed
using the Agatston method. A 120 kV retrospective, gated, contrast
cardiac scan was obtained. Gantry rotation speed was 250 msecs and
collimation was 0.6 mm. Nitroglycerin was not given. A delayed scan
was obtained to exclude left atrial appendage thrombus. The 3D
dataset was reconstructed in 5% intervals of the 0-95% of the R-R
cycle. Late systolic phases were analyzed on a dedicated workstation
using MPR, MIP, and VRT modes. The patient received 80 cc of
contrast.

[Series 14: 0-90% · axial · 0.39mm/px · z∈[+1357,+1429]mm · 3 of 2890 slices shown, 4 images (1 of 2)]
[im 723/2890  vessel]
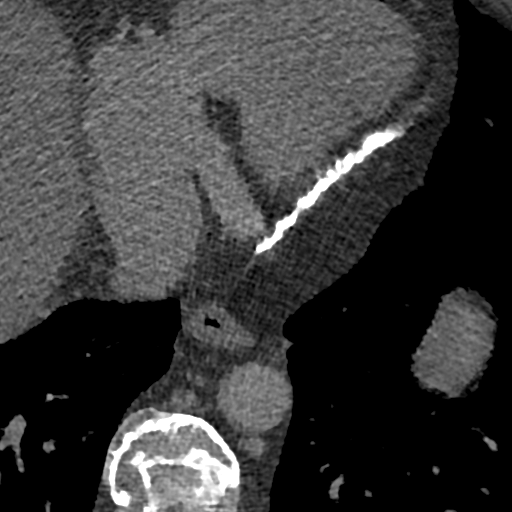
[im 723/2890  lung]
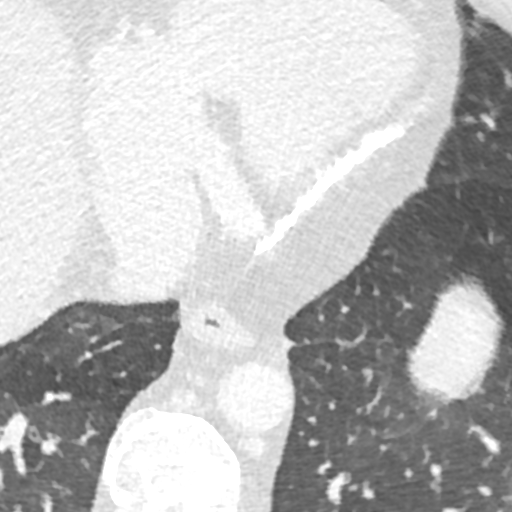
[im 1445/2890  vessel]
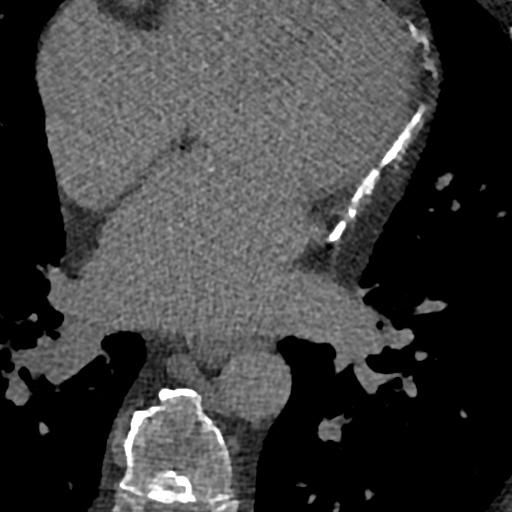
[im 2167/2890  vessel]
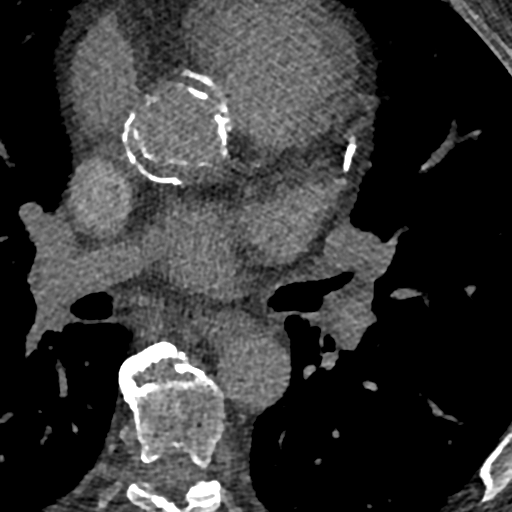

[Series 18: 5-95% · axial · 0.41mm/px · z∈[+1357,+1429]mm · 3 of 2890 slices shown]
[im 723/2890  vessel]
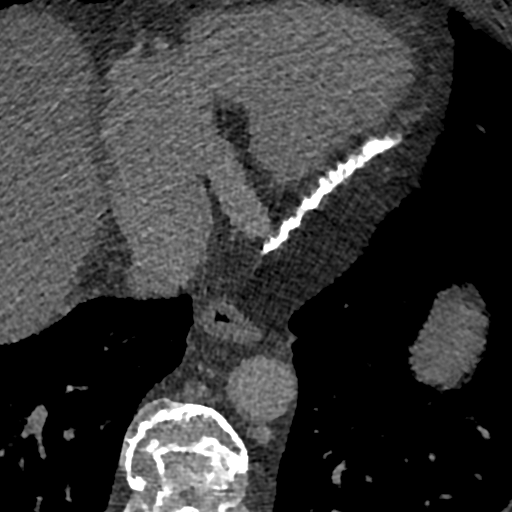
[im 1445/2890  vessel]
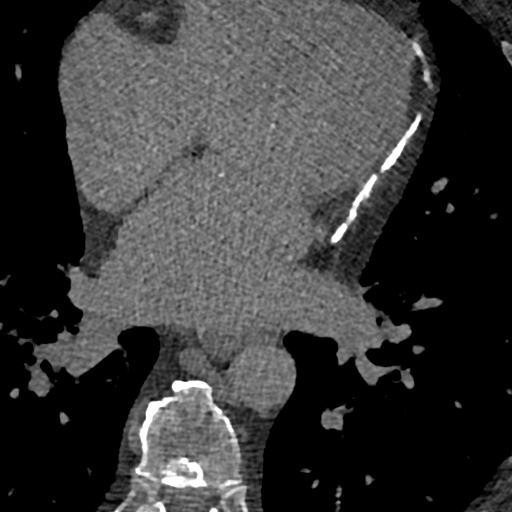
[im 2167/2890  vessel]
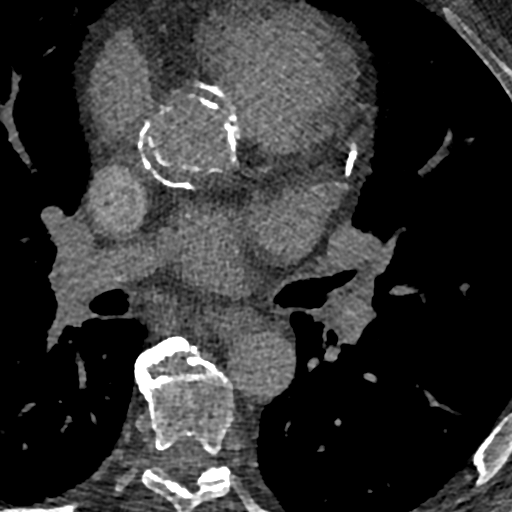

[Series 19: 0-90% · axial · 0.51mm/px · z∈[+1363,+1441]mm · 3 of 3110 slices shown (2 of 2)]
[im 778/3110  vessel]
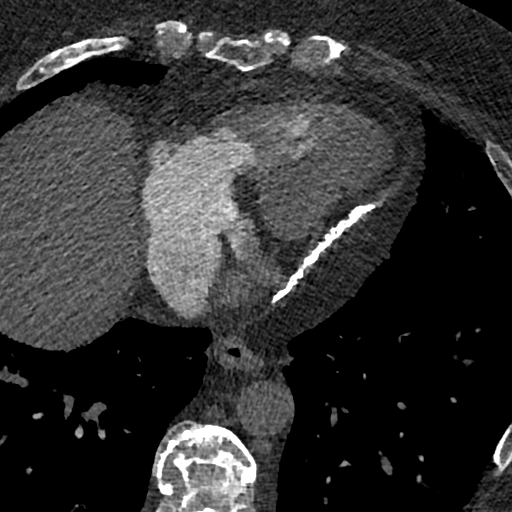
[im 1555/3110  vessel]
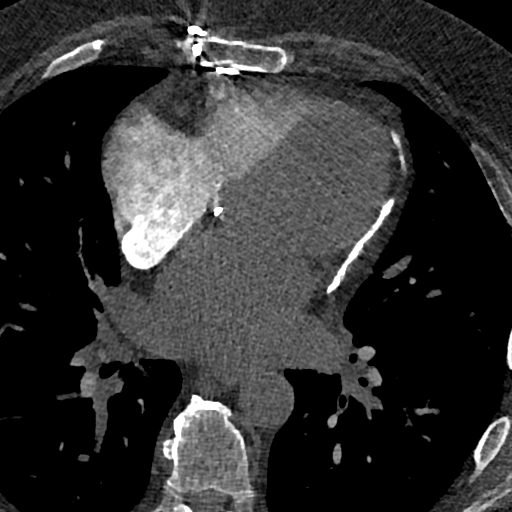
[im 2332/3110  vessel]
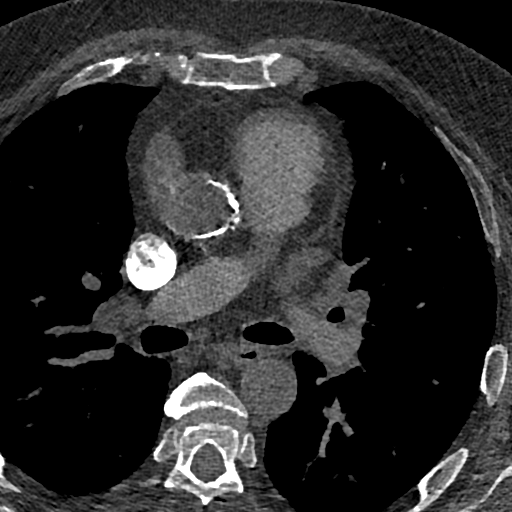

[9 of 20 positions shown; findings below may reference images not displayed]

FINDINGS: Vascular: Prior median sternotomy and aortic valve replacement.
Heart is normal size. Scattered aortic calcifications. No aneurysm.
Pericardial calcifications again noted as described on prior study.
No pericardial effusion.

Mediastinum/Nodes: No adenopathy

Lungs/Pleura: No confluent airspace opacities or effusions.

Upper Abdomen: Imaging into the upper abdomen demonstrates no acute
findings.

Musculoskeletal: Chest wall soft tissues are unremarkable. No acute
bony abnormality.
IMPRESSION: Aortic atherosclerosis.  Prior aortic valve replacement.

Pericardial calcifications, likely related to prior pericarditis. No
associated pericardial effusion.

No acute extra cardiac abnormality.
FINDINGS: Image quality: Excellent.

Noise artifact is: Limited.

Pulmonary Veins: There is normal pulmonary vein drainage into the
left atrium (2 on the right and 2 on the left) with ostial
measurements as follows:

RUPV: Ostium 18.2 mm x 15.6  mm  area 1.97  Cm2

RLPV:  Ostium 20.1 mm X 16.3 mm  area 2.40 cm2

LUPV:  Ostium 21 mm x 18.6  Mm  area 4.43 cm2

LLPV:  Ostium 27 mm x 22.8 mm  area 4.43 cm

Left Atrium: The left atrial size is normal. There is no PFO/ASD.
The left atrial appendage is large broccoli type with two lobes.
There is no thrombus in the left atrial appendage on contrast or
delayed imaging. The esophagus runs in the left atrial midline and
is not in proximity to any of the pulmonary vein ostia.

Coronary Arteries: Unable to get accurate information on coronary
calcium score. The study was performed without use of NTG and is
insufficient for plaque evaluation.

Right Atrium: Right atrial size is within normal limits.

Right Ventricle: The right ventricular cavity is within normal
limits.

Left Ventricle: The ventricular cavity size is within normal limits.
There are no stigmata of prior infarction. There is no abnormal
filling defect.

Pericardium: Evidence of significant pericardial calcification.

Pulmonary Artery: Normal caliber without proximal filling defect.

Cardiac valves: The aortic valve has been replaced.

Aorta: Normal caliber with no significant disease.

Extra-cardiac findings: See attached radiology report for
non-cardiac structures.
IMPRESSION: 1. There is normal pulmonary vein drainage into the left atrium with
ostial measurements above.

2. There is no thrombus in the left atrial appendage.

3. The esophagus runs in the left atrial midline and is not in
proximity to any of the pulmonary vein ostia.

4. No PFO/ASD.

5. Evidence of significant pericardial calcification.

6.  Normal coronary origin. Right dominance.

Nato Mayne, DO

*** End of Addendum ***
EXAM:
OVER-READ INTERPRETATION  CT CHEST

The following report is an over-read performed by radiologist Dr.
Blain Jumper [REDACTED] on 03/13/2021. This
over-read does not include interpretation of cardiac or coronary
anatomy or pathology. The coronary CTA interpretation by the
cardiologist is attached.
FINDINGS: Vascular: Prior median sternotomy and aortic valve replacement.
Heart is normal size. Scattered aortic calcifications. No aneurysm.
Pericardial calcifications again noted as described on prior study.
No pericardial effusion.

Mediastinum/Nodes: No adenopathy

Lungs/Pleura: No confluent airspace opacities or effusions.

Upper Abdomen: Imaging into the upper abdomen demonstrates no acute
findings.

Musculoskeletal: Chest wall soft tissues are unremarkable. No acute
bony abnormality.
IMPRESSION: Aortic atherosclerosis.  Prior aortic valve replacement.

Pericardial calcifications, likely related to prior pericarditis. No
associated pericardial effusion.

No acute extra cardiac abnormality.

## 2022-04-02 ENCOUNTER — Encounter: Payer: Self-pay | Admitting: Cardiovascular Disease

## 2022-04-02 ENCOUNTER — Ambulatory Visit: Payer: 59 | Attending: Cardiovascular Disease | Admitting: Cardiovascular Disease

## 2022-04-02 VITALS — BP 120/76 | HR 64 | Ht 76.0 in | Wt 284.0 lb

## 2022-04-02 DIAGNOSIS — I4819 Other persistent atrial fibrillation: Secondary | ICD-10-CM | POA: Diagnosis not present

## 2022-04-02 NOTE — Progress Notes (Signed)
Cardiology Office Note:    Date:  04/02/2022   ID:  [name], DOB [birth year], MRN [id number]  PCP:  Loni Beckwith, MD   [facility] Providers Cardiologist:  Rollene Rotunda, MD Electrophysiologist:  Maurice Small, MD     Referring MD: Loni Beckwith, MD   Chief complaint: follow-up for AF  History of Present Illness:    MAXAMILLION BANAS is a 61 y.o. male with a hx of bioprosthetic aortic valve replacement in 2005, gastric bypass, GI bleed with possible AVMs on capsule endoscopy who presents for follow-up regarding atrial fibrillation.  He had an aortic valve replacement in 2005 with a bioprosthetic valve. He has a history of AF and was managed with Tikosyn for a while. He has had multiple cardioversions detailed below. He eventually underwent ablation in Dec 2022 with WACA and posterior wall isolation.  He had recurrence of AF about a month after discontinuing Tikosyn. He was in [place] at the time. He was cardioverted in the local ER but AF recurred about 12 hours later.  He underwent Tikosyn load in October. He has been doing well and not sensed any recurrence of AF.  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   Hx of bacterial endocarditis 11/2003   Hypertension    Neuropathy    feet bilat    Obesity    Persistent atrial fibrillation (HCC)    Sleep apnea    uses CPAP  nightly   Toe deformity 08/2011   right claw hallux    Past Surgical History:  Procedure Laterality Date   adenoid surgery      AORTIC VALVE REPLACEMENT  11/2003   ATRIAL FIBRILLATION ABLATION N/A 03/19/2021   Procedure: ATRIAL FIBRILLATION ABLATION;  Surgeon: [name], MD;  Location: [facility] INVASIVE CV LAB;  Service: Cardiovascular;  Laterality: N/A;   CARDIOVERSION N/A 07/25/2017   Procedure: CARDIOVERSION;  Surgeon: Ch[place] Si, MD;  Location: [facility] ENDOSCOPY;  Service: Cardiovascular;  Laterality: N/A;   CARDIOVERSION N/A 09/07/2017   Procedure: CARDIOVERSION;  Surgeon: Chrystie Nose, MD;  Location: [facility] ENDOSCOPY;  Service: Cardiovascular;  Laterality: N/A;   CARDIOVERSION N/A 09/29/2017   Procedure: CARDIOVERSION;  Surgeon: Thurmon Fair, MD;  Location: [facility] ENDOSCOPY;  Service: Cardiovascular;  Laterality: N/A;   CARDIOVERSION N/A 12/26/2020   Procedure: CARDIOVERSION;  Surgeon: Wendall Stade, MD;  Location: [facility] ENDOSCOPY;  Service: Cardiovascular;  Laterality: N/A;   CARDIOVERSION N/A 01/28/2022   Procedure: CARDIOVERSION;  Surgeon: Chrystie Nose, MD;  Location: [facility] ENDOSCOPY;  Service: Cardiovascular;  Laterality: N/A;   COLONOSCOPY WITH PROPOFOL N/A 03/30/2017   Procedure: COLONOSCOPY WITH PROPOFOL;  Surgeon: Benancio Deeds, MD;  Location: [facility] ENDOSCOPY;  Service: Gastroenterology;  Laterality: N/A;   ESOPHAGOGASTRODUODENOSCOPY (EGD) WITH PROPOFOL N/A 03/29/2017   Procedure: ESOPHAGOGASTRODUODENOSCOPY (EGD) WITH PROPOFOL;  Surgeon: Benancio Deeds, MD;  Location: [facility]  ENDOSCOPY;  Service: Gastroenterology;  Laterality: N/A;   GASTRIC BYPASS  03/2004   GIVENS CAPSULE STUDY N/A 03/30/2017   Procedure: GIVENS CAPSULE STUDY;  Surgeon: Benancio DeedsArmbruster, Steven P, MD;  Location: [facility] ENDOSCOPY;  Service: Gastroenterology;  Laterality: N/A;   KNEE ARTHROSCOPY  08/09/2007 - left   07/15/2003 - right   METATARSAL OSTEOTOMY  03/20/2010   right 1st MT; gastroc soleus recession   NASAL  SINUS SURGERY     TENDON RELEASE  09/09/2011   Procedure: HEEL CORD LENGTHENING;  Surgeon: Toni ArthursJohn Hewitt, MD;  Location:  SURGERY CENTER;  Service: Orthopedics;  Laterality: Right;  Righ tachilles tendon lengthening   TOE FUSION  07/12/2005   left 3rd toe PIP and DIP fusion   TONSILLECTOMY AND ADENOIDECTOMY     TOTAL KNEE ARTHROPLASTY Left 03/18/2015   Procedure: LEFT TOTAL KNEE ARTHROPLASTY;  Surgeon: Durene RomansMatthew Olin, MD;  Location: [facility] ORS;  Service: Orthopedics;  Laterality: Left;    Current Medications: Current Meds  Medication Sig   Accu-Chek FastClix Lancets MISC 2 (two) times daily. as directed   ACCU-CHEK GUIDE test strip 2 (two) times daily.   Ascorbic Acid (VITAMIN C) 1000 MG tablet Take 1,000 mg by mouth in the morning.   Calcium Carb-Cholecalciferol (CALCIUM 600+D3 PO) Take 1 tablet by mouth in the morning and at bedtime.   cetirizine (ZYRTEC) 10 MG tablet Take 10 mg by mouth daily.    clindamycin (CLEOCIN) 150 MG capsule Take 600 mg by mouth See admin instructions. Take 4 tablets by mouth 1 hour prior to seeing the dentist   dofetilide (TIKOSYN) 125 MCG capsule Take 1 capsule (125 mcg total) by mouth 2 (two) times daily.   esomeprazole (NEXIUM) 40 MG capsule Take 40 mg by mouth every evening.    ferrous sulfate 325 (65 FE) MG tablet Take 325 mg by mouth every evening.   furosemide (LASIX) 40 MG tablet Take daily as needed for edema or shortness of breath   KLOR-CON M20 20 MEQ tablet TAKE 1 TABLET DAILY, AND 1 EXTRA TABLET ON DAYS       TAKING  METOLAZONE.   losartan (COZAAR) 25 MG tablet Take 25 mg by mouth every evening.    magnesium oxide (MAG-OX) 400 (240 Mg) MG tablet Take 400 mg by mouth daily.   metolazone (ZAROXOLYN) 10 MG tablet Take by mouth.   metoprolol succinate (TOPROL XL) 25 MG 24 hr tablet Take 2 tablets (50 mg total) by mouth daily.   sildenafil (VIAGRA) 100 MG tablet Take 100 mg by mouth as needed.   simvastatin (ZOCOR) 10 MG tablet Take 10 mg by mouth  every evening.    sitaGLIPtin-metformin (JANUMET) 50-1000 MG tablet Take 1 tablet by mouth daily.   tadalafil (CIALIS) 20 MG tablet Take 20 mg by mouth as needed.   XARELTO 20 MG TABS tablet TAKE 1 TABLET DAILY WITH   SUPPER     Allergies:   Penicillins   Social History   Socioeconomic History   Marital status: Widowed    Spouse name: Not on file   Number of children: Not on file   Years of education: Not on file   Highest education level: Not on file  Occupational History   Not on file  Tobacco Use   Smoking status: Former    Packs/day: 1.00    Years: 2.00    Total pack years: 2.00    Types: Cigars, Pipe, Cigarettes    Quit date: 01/31/2011    Years since  quitting: 11.1   Smokeless tobacco: Never   Tobacco comments:    Former smoker (01/01/2021)  Vaping Use   Vaping Use: Never used  Substance and Sexual Activity   Alcohol use: Yes    Alcohol/week: 2.0 standard drinks of alcohol    Types: 2 Standard drinks or equivalent per week    Comment: 2 drinks a month 01/26/22   Drug use: No   Sexual activity: Not on file  Other Topics Concern   Not on file  Social History Narrative   Not on file   Social Determinants of Health   Financial Resource Strain: Not on file  Food Insecurity: No Food Insecurity (01/27/2022)   Hunger Vital Sign    Worried About Running Out of Food in the Last Year: Never true    Ran Out of Food in the Last Year: Never true  Transportation Needs: No Transportation Needs (01/27/2022)   PRAPARE - Administrator, Civil Service (Medical): No    Lack of Transportation (Non-Medical): No  Physical Activity: Not on file  Stress: Not on file  Social Connections: Not on file     Family History: The patient's family history includes Alzheimer's disease in his mother; Colon cancer in his unknown relative; Diabetes in his father.  ROS:   Please see the history of present illness.     All other systems reviewed and are  negative.  EKGs/Labs/Other Studies Reviewed:    The following studies were reviewed today:  TTE 01/29/2022: 1. Left ventricular ejection fraction, by visual estimation, is 60 to  65%. The left ventricle has normal function. Normal left ventricular size.  There is no left ventricular hypertrophy.   2. Left ventricular diastolic Doppler parameters are consistent with  pseudonormalization pattern of LV diastolic filling.   3. Global right ventricle has normal systolic function.The right  ventricular size is mildly enlarged. No increase in right ventricular wall  thickness.   4. Left atrial size was moderately dilated.   5. Right atrial size was normal.   6. The mitral valve has been repaired/replaced. Mild mitral valve  regurgitation. No evidence of mitral stenosis.   7. Mitral valve annuloplasty repair. Mean gradient .   8. The tricuspid valve is normal in structure. Tricuspid valve  regurgitation is mild.   9. Aortic valve mean gradient measures 5.0 mmHg.  10. Aortic valve peak gradient measures 10.3 mmHg.  11. Aortic valve regurgitation was not visualized by color flow Doppler.  Structurally normal aortic valve, with no evidence of sclerosis or  stenosis.  12. Bioprosthetic aortic valve.  13. The pulmonic valve was normal in structure. Pulmonic valve  regurgitation is not visualized by color flow Doppler.  14. Normal pulmonary artery systolic pressure.  15. The inferior vena cava is normal in size with greater than 50%  respiratory variability, suggesting right atrial pressure of 3 mmHg.   EKG:  EKG is ordered today 01/18/2022, shows atrial fibrillation with RVR.    Orders placed or performed during the hospital encounter of 02/05/22   EKG 12-Lead   EKG 12-Lead     Recent Labs: 07/09/2021: Hemoglobin 12.4; Platelets 217 02/05/2022: BUN 24; Creatinine, Ser 1.37; Magnesium 2.1; Potassium 4.0; Sodium 144         Physical Exam:    VS:  BP 120/76   Pulse 64   Ht 6'  4" (1.93 m)   Wt 284 lb (128.8 kg)   SpO2 100%   BMI 34.57 kg/m  Wt Readings from Last 3 Encounters:  04/02/22 284 lb (128.8 kg)  02/05/22 274 lb 6.4 oz (124.5 kg)  01/27/22 274 lb (124.3 kg)     GEN:  Well nourished, well developed in no acute distress CARDIAC: Irregular, rapid, no murmurs, rubs, gallops RESPIRATORY:  Normal work of breathing MUSCULOSKELETAL: + edema, appears chronic    ASSESSMENT & PLAN:    In order of problems listed above:  Atrial fibrillation: persistent. S/p ablation Dec 2022. Now with recurrence of persistent AF.  Tolerating Tikosyn. No definitive evidence of recurrence. Continue xarelto. High risk medication: resuming Tikosyn. Will admit for observation Long term use of anticoagulation: continue xarelto indefinitely History of bioprosthetic aortic valve: follows with Dr. Antoine Poche        Medication Adjustments/Labs and Tests Ordered: Current medicines are reviewed at length with the patient today.  Concerns regarding medicines are outlined above.  No orders of the defined types were placed in this encounter.  No orders of the defined types were placed in this encounter.    Signed, Maurice Small, MD  04/02/2022 9:31 AM    [facility]

## 2022-04-02 NOTE — Patient Instructions (Signed)
Medication Instructions:  Your physician recommends that you continue on your current medications as directed. Please refer to the Current Medication list given to you today.  *If you need a refill on your cardiac medications before your next appointment, please call your pharmacy*   Follow-Up: At [facility], you and your health needs are our priority.  As part of our continuing mission to provide you with exceptional heart care, we have created designated Provider Care Teams.  These Care Teams include your primary Cardiologist (physician) and Advanced Practice Providers (APPs -  Physician Assistants and Nurse Practitioners) who all work together to provide you with the care you need, when you need it.   Your next appointment:   6 month(s)  The format for your next appointment:   In Person  Provider:   You may see Augustus E Mealor, MD or one of the following Advanced Practice Providers on your designated Care Team:   Renee Ursuy, PA-C Michael "Andy" Tillery, PA-C    Important Information About Sugar       

## 2022-04-21 ENCOUNTER — Encounter (HOSPITAL_COMMUNITY): Payer: Self-pay | Admitting: Physician Assistant

## 2022-04-21 ENCOUNTER — Ambulatory Visit (HOSPITAL_COMMUNITY)
Admission: RE | Admit: 2022-04-21 | Discharge: 2022-04-21 | Disposition: A | Discharge status: Home / Self Care | Payer: 59 | Source: Ambulatory Visit | Attending: Physician Assistant | Admitting: Physician Assistant

## 2022-04-21 VITALS — BP 128/80 | HR 60 | Ht 76.0 in | Wt 306.6 lb

## 2022-04-21 DIAGNOSIS — I4819 Other persistent atrial fibrillation: Secondary | ICD-10-CM | POA: Diagnosis not present

## 2022-04-21 DIAGNOSIS — Z7901 Long term (current) use of anticoagulants: Secondary | ICD-10-CM | POA: Diagnosis not present

## 2022-04-21 DIAGNOSIS — I110 Hypertensive heart disease with heart failure: Secondary | ICD-10-CM | POA: Insufficient documentation

## 2022-04-21 DIAGNOSIS — I5032 Chronic diastolic (congestive) heart failure: Secondary | ICD-10-CM | POA: Insufficient documentation

## 2022-04-21 DIAGNOSIS — Z953 Presence of xenogenic heart valve: Secondary | ICD-10-CM | POA: Insufficient documentation

## 2022-04-21 DIAGNOSIS — D6869 Other thrombophilia: Secondary | ICD-10-CM | POA: Insufficient documentation

## 2022-04-21 DIAGNOSIS — E669 Obesity, unspecified: Secondary | ICD-10-CM | POA: Insufficient documentation

## 2022-04-21 DIAGNOSIS — Z6837 Body mass index (BMI) 37.0-37.9, adult: Secondary | ICD-10-CM | POA: Insufficient documentation

## 2022-04-21 DIAGNOSIS — G4733 Obstructive sleep apnea (adult) (pediatric): Secondary | ICD-10-CM | POA: Insufficient documentation

## 2022-04-21 LAB — BASIC METABOLIC PANEL
Anion gap: 9 (ref 5–15)
BUN: 30 mg/dL — ABNORMAL HIGH (ref 8–23)
CO2: 25 mmol/L (ref 22–32)
Calcium: 9 mg/dL (ref 8.9–10.3)
Chloride: 100 mmol/L (ref 98–111)
Creatinine, Ser: 1.24 mg/dL (ref 0.61–1.24)
GFR, Estimated: >60 mL/min (ref >60)
Glucose, Bld: 110 mg/dL — ABNORMAL HIGH (ref 70–99)
Potassium: 4.1 mmol/L (ref 3.5–5.1)
Sodium: 134 mmol/L — ABNORMAL LOW (ref 135–145)

## 2022-04-21 LAB — MAGNESIUM: Magnesium: 2.1 mg/dL (ref 1.7–2.4)

## 2022-04-21 LAB — BRAIN NATRIURETIC PEPTIDE: B Natriuretic Peptide: 225.3 pg/mL — ABNORMAL HIGH (ref 0.0–100.0)

## 2022-04-21 MED ORDER — METOPROLOL SUCCINATE ER 50 MG PO TB24: 50.0000 mg | ORAL_TABLET | Freq: Every day | ORAL | 2 refills | Status: DC

## 2022-04-21 NOTE — Progress Notes (Addendum)
Primary Care Physician: Lowella Petties, MD Referring Physician: Dr. Gaspar Garbe Philip [name] is a 62 y.o. 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 Pt had been staying in [place] for a long period of time on Tikosyn 250 mcg bid and talked Dr. Rayann Heman into stopping drug  in June since he was doing so well. Unfortunately, he went back into afib in September.He was in [place] at the time. He was cardioverted in the local ER but AF recurred about 12 hours later. He saw Dr. Myles Gip 10/2 and decided to reload tikosyn. He was on 250 mcg in the past for borderline qt. Qt today is 499 in afib. No benadryl use and no missed anticoagulation. Pt is not taking any contraindicated drugs, as reviewed by PharmD.  Patient is s/p dofetilide reloading 10/10-10/13/23 with DCCV on 01/28/22.   On follow up today, patient saw cardiology in [facility] to establish care and was found to be back in afib. A monitor was placed which showed no afib, thought to be paroxysmal. ECG appears to be afib today. It does not feel like his afib in the past but he has noted a little more lower extremity edema. His weight is up but he admits to not following his normal diet over the past several months.   Today, he denies symptoms of palpitations, chest pain, shortness of breath, orthopnea, PND, dizziness, presyncope, syncope, or neurologic sequela. The patient is tolerating medications without difficulties and is otherwise without complaint today.   Past Medical History:  Diagnosis Date   Arthritis    Dental crowns present    Diabetes mellitus    NIDDM   Diabetic foot ulcer ([place]) 08/2011   right foot   Fatty liver    Gastric ulcer    no current problems   GERD (gastroesophageal reflux disease)    Gout    H/O mitral valve repair 11/2003   Hx of aortic valve replacement 11/2003   Porcine  aortic valve with aortic root replacement   Hx of bacterial endocarditis 11/2003   Hypertension    Neuropathy    feet bilat    Obesity    Persistent atrial fibrillation (HCC)    Sleep apnea    uses CPAP nightly   Toe deformity 08/2011   right claw hallux   Past Surgical History:  Procedure Laterality Date   adenoid surgery      AORTIC VALVE REPLACEMENT  11/2003   ATRIAL FIBRILLATION ABLATION N/A 03/19/2021   Procedure: ATRIAL FIBRILLATION ABLATION;  Surgeon: Thompson Grayer, MD;  Location: [facility] CV LAB;  Service: Cardiovascular;  Laterality: N/A;   CARDIOVERSION N/A 07/25/2017   Procedure: CARDIOVERSION;  Surgeon: Skeet Latch, MD;  Location: [facility];  Service: Cardiovascular;  Laterality: N/A;   CARDIOVERSION N/A 09/07/2017   Procedure: CARDIOVERSION;  Surgeon: Pixie Casino, MD;  Location: [facility] ENDOSCOPY;  Service: Cardiovascular;  Laterality: N/A;   CARDIOVERSION N/A 09/29/2017   Procedure: CARDIOVERSION;  Surgeon: Sanda Klein, MD;  Location: [facility] ENDOSCOPY;  Service: Cardiovascular;  Laterality: N/A;   CARDIOVERSION N/A 12/26/2020   Procedure: CARDIOVERSION;  Surgeon: Josue Hector, MD;  Location: [facility] ENDOSCOPY;  Service: Cardiovascular;  Laterality: N/A;   CARDIOVERSION N/A 01/28/2022   Procedure: CARDIOVERSION;  Surgeon: Pixie Casino, MD;  Location: [place];  Service: Cardiovascular;  Laterality: N/A;   COLONOSCOPY  WITH PROPOFOL N/A 03/30/2017   Procedure: COLONOSCOPY WITH PROPOFOL;  Surgeon: Benancio Deeds, MD;  Location: [facility] ENDOSCOPY;  Service: Gastroenterology;  Laterality: N/A;   ESOPHAGOGASTRODUODENOSCOPY (EGD) WITH PROPOFOL N/A 03/29/2017   Procedure: ESOPHAGOGASTRODUODENOSCOPY (EGD) WITH PROPOFOL;  Surgeon: Benancio Deeds, MD;  Location: [facility] ENDOSCOPY;  Service: Gastroenterology;  Laterality: N/A;   GASTRIC BYPASS  03/2004   GIVENS CAPSULE STUDY N/A 03/30/2017   Procedure: GIVENS CAPSULE STUDY;  Surgeon: Benancio Deeds, MD;  Location:  [facility] ENDOSCOPY;  Service: Gastroenterology;  Laterality: N/A;   KNEE ARTHROSCOPY  08/09/2007 - left   07/15/2003 - right   METATARSAL OSTEOTOMY  03/20/2010   right 1st MT; gastroc soleus recession   NASAL SINUS SURGERY     TENDON RELEASE  09/09/2011   Procedure: HEEL CORD LENGTHENING;  Surgeon: Toni Arthurs, MD;  Location: [facility];  Service: Orthopedics;  Laterality: Right;  Righ tachilles tendon lengthening   TOE FUSION  07/12/2005   left 3rd toe PIP and DIP fusion   TONSILLECTOMY AND ADENOIDECTOMY     TOTAL KNEE ARTHROPLASTY Left 03/18/2015   Procedure: LEFT TOTAL KNEE ARTHROPLASTY;  Surgeon: Durene Romans, MD;  Location: [facility] ORS;  Service: Orthopedics;  Laterality: Left;    Current Outpatient Medications  Medication Sig Dispense Refill   Accu-Chek FastClix Lancets MISC 2 (two) times daily. as directed     ACCU-CHEK GUIDE test strip 2 (two) times daily.     Ascorbic Acid (VITAMIN C) 1000 MG tablet Take 1,000 mg by mouth in the morning.     Calcium Carb-Cholecalciferol (CALCIUM 600+D3 PO) Take 1 tablet by mouth in the morning and at bedtime.     cetirizine (ZYRTEC) 10 MG tablet Take 10 mg by mouth daily.      clindamycin (CLEOCIN) 150 MG capsule Take 600 mg by mouth See admin instructions. Take 4 tablets by mouth 1 hour prior to seeing the dentist     dofetilide (TIKOSYN) 125 MCG capsule Take 1 capsule (125 mcg total) by mouth 2 (two) times daily. 180 capsule 1   esomeprazole (NEXIUM) 40 MG capsule Take 40 mg by mouth every evening.      ferrous sulfate 325 (65 FE) MG tablet Take 325 mg by mouth every evening.     furosemide (LASIX) 40 MG tablet Take daily as needed for edema or shortness of breath 30 tablet 6   KLOR-CON M20 20 MEQ tablet TAKE 1 TABLET DAILY, AND 1 EXTRA TABLET ON DAYS       TAKING  METOLAZONE. 95 tablet 2   losartan (COZAAR) 25 MG tablet Take 25 mg by mouth every evening.      magnesium oxide (MAG-OX) 400 (240 Mg) MG tablet Take 400 mg by mouth daily.      metFORMIN (GLUCOPHAGE) 500 MG tablet      metolazone (ZAROXOLYN) 10 MG tablet Take by mouth.     sildenafil (VIAGRA) 100 MG tablet Take 100 mg by mouth as needed.     simvastatin (ZOCOR) 10 MG tablet Take 10 mg by mouth every evening.      sitaGLIPtin (JANUVIA) 100 MG tablet      tadalafil (CIALIS) 20 MG tablet Take 20 mg by mouth as needed.     XARELTO 20 MG TABS tablet TAKE 1 TABLET DAILY WITH   SUPPER 90 tablet 3   metoprolol succinate (TOPROL XL) 50 MG 24 hr tablet Take 1 tablet (50 mg total) by mouth daily. 90 tablet 2   No  current facility-administered medications for this encounter.    Allergies  Allergen Reactions   Penicillins Hives, Swelling and Other (See Comments)    NO REACTION LISTED Has patient had a PCN reaction causing immediate rash, facial/tongue/throat swelling, SOB or lightheadedness with hypotension: Yes Has patient had a PCN reaction causing severe rash involving mucus membranes or skin necrosis: No Has patient had a PCN reaction that required hospitalization No Has patient had a PCN reaction occurring within the last 10 years: No If all of the above answers are "NO", then may proceed with Cephalosporin use.  Has taken Keflex w/o issue        Social History   Socioeconomic History   Marital status: Widowed    Spouse name: Not on file   Number of children: Not on file   Years of education: Not on file   Highest education level: Not on file  Occupational History   Not on file  Tobacco Use   Smoking status: Former    Packs/day: 1.00    Years: 2.00    Total pack years: 2.00    Types: Cigars, Pipe, Cigarettes    Quit date: 01/31/2011    Years since quitting: 11.2   Smokeless tobacco: Never   Tobacco comments:    Former smoker (01/01/2021)  Vaping Use   Vaping Use: Never used  Substance and Sexual Activity   Alcohol use: Not Currently    Alcohol/week: 2.0 standard drinks of alcohol    Types: 2 Standard drinks or equivalent per week    Comment:  stop drinking 04/21/22   Drug use: No   Sexual activity: Not on file  Other Topics Concern   Not on file  Social History Narrative   Not on file   Social Determinants of Health   Financial Resource Strain: Not on file  Food Insecurity: No Food Insecurity (01/27/2022)   Hunger Vital Sign    Worried About Running Out of Food in the Last Year: Never true    Ran Out of Food in the Last Year: Never true  Transportation Needs: No Transportation Needs (01/27/2022)   PRAPARE - Administrator, Civil Service (Medical): No    Lack of Transportation (Non-Medical): No  Physical Activity: Not on file  Stress: Not on file  Social Connections: Not on file  Intimate Partner Violence: Not At Risk (01/27/2022)   Humiliation, Afraid, Rape, and Kick questionnaire    Fear of Current or Ex-Partner: No    Emotionally Abused: No    Physically Abused: No    Sexually Abused: No    Family History  Problem Relation Age of Onset   Alzheimer's disease Mother    Diabetes Father    Colon cancer Unknown        family history    ROS- All systems are reviewed and negative except as per the HPI above  Physical Exam: Vitals:   04/21/22 0848  BP: 128/80  Pulse: 60  Weight: (!) 139.1 kg  Height: 6\' 4"  (1.93 m)     Wt Readings from Last 3 Encounters:  04/21/22 (!) 139.1 kg  04/02/22 128.8 kg  02/05/22 124.5 kg    Labs: Lab Results  Component Value Date   NA 144 02/05/2022   K 4.0 02/05/2022   CL 107 02/05/2022   CO2 23 02/05/2022   GLUCOSE 84 02/05/2022   BUN 24 (H) 02/05/2022   CREATININE 1.37 (H) 02/05/2022   CALCIUM 9.4 02/05/2022   MG 2.1 02/05/2022  Lab Results  Component Value Date   INR 1.3 (H) 07/14/2017   No results found for: "CHOL", "HDL", "LDLCALC", "TRIG"   GEN- The patient is a well appearing obese male, alert and oriented x 3 today.   HEENT-head normocephalic, atraumatic, sclera clear, conjunctiva pink, hearing intact, trachea midline. Lungs- Clear to  ausculation bilaterally, normal work of breathing Heart- irregular rate and rhythm, no murmurs, rubs or gallops  GI- soft, NT, ND, + BS Extremities- no clubbing, cyanosis, chronic venous stasis, 1+ bilateral edema MS- no significant deformity or atrophy Skin- no rash or lesion Psych- euthymic mood, full affect Neuro- strength and sensation are intact   EKG-  Afib Vent. rate 60 BPM PR interval * ms QRS duration 110 ms QT/QTcB 470/470 ms  Addendum: ECG SR with PAC per EP   CHA2DS2-VASc Score = 3  The patient's score is based upon: CHF History: 1 HTN History: 1 Diabetes History: 1 Stroke History: 0 Vascular Disease History: 0 Age Score: 0 Gender Score: 0       ASSESSMENT AND PLAN: 1. Persistent Atrial Fibrillation (ICD10:  I48.19) The patient's CHA2DS2-VASc score is 3, indicating a 3.2% annual risk of stroke.   S/p dofetilide reload 10/10-10/13/23  Patient appears to be in afib today, recent monitor 02/2022 did not show afib. Patient will continue to monitor afib burden on his smart watch, if he is persistent can consider DCCV. May need repeat ablation long term.  Continue dofetilide 125 mcg BID Check bmet/mag today. Continue Toprol 50 mg daily Continue Xarelto 20 mg daily  2. Secondary Hypercoagulable State (ICD10:  D68.69) The patient is at significant risk for stroke/thromboembolism based upon his CHA2DS2-VASc Score of 3.  Continue Rivaroxaban (Xarelto).   3. HTN Stable, no changes today.  4. Obesity  Body mass index is 37.32 kg/m. S/p gastric bypass Lifestyle modification was discussed and encouraged including regular physical activity and weight reduction. Patient resumed diet yesterday.  5. S/p bioprosthetic AVR Followed by Dr Percival Spanish.  6. OSA Encouraged compliance with CPAP therapy.  7. Chronic diastolic dysfunction His weight is up but unclear how much of this is fluid vs true weight gain. Check BNP today.    Follow up with Dr Myles Gip as  scheduled. Sooner in AF clinic if needed.    [facility] [address] [place], [place] [address] [phone]

## 2022-04-21 NOTE — Addendum Note (Signed)
Encounter addended by: Oliver Barre, PA on: 04/21/2022 11:40 AM  Actions taken: Clinical Note Signed

## 2022-04-26 ENCOUNTER — Other Ambulatory Visit: Payer: Self-pay

## 2022-04-26 MED ORDER — METOPROLOL SUCCINATE ER 50 MG PO TB24: 50.0000 mg | ORAL_TABLET | Freq: Every day | ORAL | 2 refills | Status: DC

## 2022-05-27 ENCOUNTER — Encounter (HOSPITAL_COMMUNITY): Payer: Self-pay | Admitting: *Deleted

## 2022-06-30 ENCOUNTER — Other Ambulatory Visit (HOSPITAL_COMMUNITY): Payer: Self-pay | Admitting: Physician Assistant

## 2022-08-25 ENCOUNTER — Telehealth: Payer: Self-pay | Admitting: *Deleted

## 2022-08-25 NOTE — Telephone Encounter (Signed)
   Pre-operative Risk Assessment    Patient Name: Philip [name]  DOB: 02-24-1961 MRN: [id number]      Request for Surgical Clearance    Procedure:   Left Anatomic TSA  Date of Surgery:  Clearance TBD                                 Surgeon:  Dr.Steven Ranell Patrick Surgeon's Group or Practice Name:  [facility] Phone number:  [phone] Fax number:  [phone]   Type of Clearance Requested:   - Medical  - Pharmacy:  Hold Rivaroxaban (Xarelto) Not Indicated   Type of Anesthesia:   Choice   Additional requests/questions:    Signed, Emmit Pomfret   08/25/2022, 7:39 AM

## 2022-08-26 ENCOUNTER — Telehealth: Payer: Self-pay | Admitting: *Deleted

## 2022-08-26 NOTE — Telephone Encounter (Signed)
Patient with diagnosis of afib on Xarelto for anticoagulation.    Procedure: left anatomic TSA Date of procedure: TBD  CHA2DS2-VASc Score = 3  This indicates a 3.2% annual risk of stroke. The patient's score is based upon: CHF History: 1 HTN History: 1 Diabetes History: 1 Stroke History: 0 Vascular Disease History: 0 Age Score: 0 Gender Score: 0  CrCl 19mL/min using adj body weight Platelet count 147K  Per office protocol, patient can hold Xarelto  for 3 days prior to procedure.    **This guidance is not considered finalized until pre-operative APP has relayed final recommendations.**

## 2022-08-26 NOTE — Telephone Encounter (Signed)
   Name: Philip [name]  DOB: December 16, 1960  MRN: [id number]  Primary Cardiologist: Rollene Rotunda, MD  Chart reviewed as part of pre-operative protocol coverage. Because of Philip [name]'s past medical history and time since last visit, he will require a follow-up telephone visit in order to better assess preoperative cardiovascular risk.  Pre-op covering staff: - Please schedule appointment and call patient to inform them. If patient already had an upcoming appointment within acceptable timeframe, please add "pre-op clearance" to the appointment notes so provider is aware. - Please contact requesting surgeon's office via preferred method (i.e, phone, fax) to inform them of need for appointment prior to surgery.   Per office protocol, patient can hold Xarelto  for 3 days prior to procedure   Sharlene Dory, PA-C  08/26/2022, 8:45 AM

## 2022-08-26 NOTE — Telephone Encounter (Signed)
I s/w the pt and he has been scheduled for a tele pre op appt 09/03/22 @ 9:20. Pt is driving right now and wants to review meds with pre op APP during the tele appt. Consent has been given though.      Patient Consent for Virtual Visit        Philip [name] has provided verbal consent on 08/26/2022 for a virtual visit (video or telephone).   CONSENT FOR VIRTUAL VISIT FOR:  Philip [name]  By participating in this virtual visit I agree to the following:  I hereby voluntarily request, consent and authorize [facility] and its employed or contracted physicians, physician assistants, nurse practitioners or other licensed health care professionals (the Practitioner), to provide me with telemedicine health care services (the "Services") as deemed necessary by the treating Practitioner. I acknowledge and consent to receive the Services by the Practitioner via telemedicine. I understand that the telemedicine visit will involve communicating with the Practitioner through live audiovisual communication technology and the disclosure of certain medical information by electronic transmission. I acknowledge that I have been given the opportunity to request an in-person assessment or other available alternative prior to the telemedicine visit and am voluntarily participating in the telemedicine visit.  I understand that I have the right to withhold or withdraw my consent to the use of telemedicine in the course of my care at any time, without affecting my right to future care or treatment, and that the Practitioner or I may terminate the telemedicine visit at any time. I understand that I have the right to inspect all information obtained and/or recorded in the course of the telemedicine visit and may receive copies of available information for a reasonable fee.  I understand that some of the potential risks of receiving the Services via telemedicine include:  Delay or interruption in medical evaluation due to  technological equipment failure or disruption; Information transmitted may not be sufficient (e.g. poor resolution of images) to allow for appropriate medical decision making by the Practitioner; and/or  In rare instances, security protocols could fail, causing a breach of personal health information.  Furthermore, I acknowledge that it is my responsibility to provide information about my medical history, conditions and care that is complete and accurate to the best of my ability. I acknowledge that Practitioner's advice, recommendations, and/or decision may be based on factors not within their control, such as incomplete or inaccurate data provided by me or distortions of diagnostic images or specimens that may result from electronic transmissions. I understand that the practice of medicine is not an exact science and that Practitioner makes no warranties or guarantees regarding treatment outcomes. I acknowledge that a copy of this consent can be made available to me via my patient portal [facility] MyChart), or I can request a printed copy by calling the office of [facility].    I understand that my insurance will be billed for this visit.   I have read or had this consent read to me. I understand the contents of this consent, which adequately explains the benefits and risks of the Services being provided via telemedicine.  I have been provided ample opportunity to ask questions regarding this consent and the Services and have had my questions answered to my satisfaction. I give my informed consent for the services to be provided through the use of telemedicine in my medical care

## 2022-08-26 NOTE — Telephone Encounter (Signed)
I s/w the pt and he has been scheduled for a tele pre op appt 09/03/22 @ 9:20. Pt is driving right now and wants to review meds with pre op APP during the tele appt. Consent has been given though.

## 2022-09-03 ENCOUNTER — Ambulatory Visit: Payer: 59 | Attending: Internal Medicine | Admitting: Nurse Practitioner

## 2022-09-03 ENCOUNTER — Encounter: Payer: Self-pay | Admitting: Nurse Practitioner

## 2022-09-03 DIAGNOSIS — Z01810 Encounter for preprocedural cardiovascular examination: Secondary | ICD-10-CM

## 2022-09-03 NOTE — Progress Notes (Signed)
Virtual Visit via Telephone Note   Because of Philip [name]'s co-morbid illnesses, he is at least at moderate risk for complications without adequate follow up.  This format is felt to be most appropriate for this patient at this time.  The patient did not have access to video technology/had technical difficulties with video requiring transitioning to audio format only (telephone).  All issues noted in this document were discussed and addressed.  No physical exam could be performed with this format.  Please refer to the patient's chart for his consent to telehealth for [facility].  Evaluation Performed:  Preoperative cardiovascular risk assessment _____________   Date:  09/03/2022   Patient ID:  Philip [name], DOB 12/14/1960, MRN [id number] Patient Location:  Home Provider location:   Office  Primary Care Provider:  Loni Beckwith, MD Primary Cardiologist:  Rollene Rotunda, MD  Chief Complaint / Patient Profile   62 y.o. y/o male with a h/o bioprosthetic aortic valve replacement 2005, gastric bypass, GI bleed with possible AVMs on capsule endoscopy, atrial fibrillation on chronic anticoagulation, OSA on CPAP, a fib ablation with return of a fib now on Tikosyn who is pending left anatomic TSA and presents today for telephonic preoperative cardiovascular risk assessment.  History of Present Illness    Philip [name] is a 62 y.o. male who presents via audio/video conferencing for a telehealth visit today.  Pt was last seen in cardiology clinic on 04/02/22 by [name] and 04/21/22 by Jorja Loa, PA in [facility].  At that time ESDRAS MARCOE was doing well.  The patient is now pending procedure as outlined above. Since his last visit, he denies chest pain, shortness of breath, lower extremity edema, fatigue, palpitations, melena, hematuria, hemoptysis, diaphoresis, weakness, presyncope, syncope, orthopnea, and PND. He works a sedentary job but has been working on building a house  involving heavy lifting and walking and is able to achieve > 4 METS activity without concerning cardiac symptoms.   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   Hx of bacterial endocarditis 11/2003   Hypertension    Neuropathy    feet bilat    Obesity    Persistent atrial fibrillation (HCC)    Sleep apnea    uses CPAP nightly   Toe deformity 08/2011   right claw hallux   Past Surgical History:  Procedure Laterality Date   adenoid surgery      AORTIC VALVE REPLACEMENT  11/2003   ATRIAL FIBRILLATION ABLATION N/A 03/19/2021   Procedure: ATRIAL FIBRILLATION ABLATION;  Surgeon: [name], MD;  Location: [facility] INVASIVE CV LAB;  Service: Cardiovascular;  Laterality: N/A;   CARDIOVERSION N/A 07/25/2017   Procedure: CARDIOVERSION;  Surgeon: Chilton Si, MD;  Location: [facility] ENDOSCOPY;  Service: Cardiovascular;  Laterality: N/A;   CARDIOVERSION N/A 09/07/2017   Procedure: CARDIOVERSION;  Surgeon: Chrystie Nose, MD;  Location: [facility] ENDOSCOPY;  Service: Cardiovascular;  Laterality: N/A;   CARDIOVERSION N/A 09/29/2017   Procedure: CARDIOVERSION;  Surgeon: Croitoru,  Rachelle Hora, MD;  Location: [facility] ENDOSCOPY;  Service: Cardiovascular;  Laterality: N/A;   CARDIOVERSION N/A 12/26/2020   Procedure: CARDIOVERSION;  Surgeon: Wendall Stade, MD;  Location: [facility] ENDOSCOPY;  Service: Cardiovascular;  Laterality: N/A;   CARDIOVERSION N/A 01/28/2022   Procedure: CARDIOVERSION;  Surgeon: Chrystie Nose, MD;  Location: [facility] ENDOSCOPY;  Service: Cardiovascular;  Laterality: N/A;   COLONOSCOPY WITH PROPOFOL N/A 03/30/2017   Procedure: COLONOSCOPY  WITH PROPOFOL;  Surgeon: Benancio Deeds, MD;  Location: [facility] ENDOSCOPY;  Service: Gastroenterology;  Laterality: N/A;   ESOPHAGOGASTRODUODENOSCOPY (EGD) WITH PROPOFOL N/A 03/29/2017   Procedure: ESOPHAGOGASTRODUODENOSCOPY (EGD) WITH PROPOFOL;  Surgeon: Benancio Deeds, MD;  Location: [facility] ENDOSCOPY;  Service: Gastroenterology;  Laterality: N/A;   GASTRIC BYPASS  03/2004   GIVENS CAPSULE STUDY N/A 03/30/2017   Procedure: GIVENS CAPSULE STUDY;  Surgeon: Benancio Deeds, MD;  Location: [facility] ENDOSCOPY;  Service: Gastroenterology;  Laterality: N/A;   KNEE ARTHROSCOPY  08/09/2007 - left   07/15/2003 - right   METATARSAL OSTEOTOMY  03/20/2010   right 1st MT; gastroc soleus recession   NASAL SINUS SURGERY     TENDON RELEASE  09/09/2011   Procedure: HEEL CORD LENGTHENING;  Surgeon: Toni Arthurs, MD;  Location: [facility];  Service: Orthopedics;  Laterality: Right;  Righ tachilles tendon lengthening   TOE FUSION  07/12/2005   left 3rd toe PIP and DIP fusion   TONSILLECTOMY AND ADENOIDECTOMY     TOTAL KNEE ARTHROPLASTY Left 03/18/2015   Procedure: LEFT TOTAL KNEE ARTHROPLASTY;  Surgeon: Durene Romans, MD;  Location: [facility] ORS;  Service: Orthopedics;  Laterality: Left;    Allergies  Allergies  Allergen Reactions   Penicillins Hives, Swelling and Other (See Comments)    NO REACTION LISTED Has patient had a PCN reaction causing immediate rash, facial/tongue/throat swelling, SOB or lightheadedness with hypotension: Yes Has patient had a PCN reaction causing severe rash involving mucus membranes or skin necrosis: No Has patient had a PCN reaction that required hospitalization No Has patient had a PCN reaction occurring within the last 10 years: No If all of the above answers are "NO", then may proceed with Cephalosporin use.  Has taken Keflex w/o issue        Home Medications    Prior to Admission medications   Medication Sig Start Date End Date Taking? Authorizing Provider   Accu-Chek FastClix Lancets MISC 2 (two) times daily. as directed 06/13/19   [provider]  ACCU-CHEK GUIDE test strip 2 (two) times daily. 06/06/19   [provider]  Ascorbic Acid (VITAMIN C) 1000 MG tablet Take 1,000 mg by mouth in the morning.    [provider]  Calcium Carb-Cholecalciferol (CALCIUM 600+D3 PO) Take 1 tablet by mouth in the morning and at bedtime.    [provider]  cetirizine (ZYRTEC) 10 MG tablet Take 10 mg by mouth daily.     [provider]  clindamycin (CLEOCIN) 150 MG capsule Take 600 mg by mouth See admin instructions. Take 4 tablets by mouth 1 hour prior to seeing the dentist 08/12/19   [provider]  dofetilide (TIKOSYN) 125 MCG capsule TAKE 1 CAPSULE BY MOUTH 2 TIMES A DAY 06/30/22   Fenton, Clint R, PA  esomeprazole (NEXIUM) 40 MG capsule Take 40 mg by mouth every evening.     [provider]  ferrous sulfate 325 (65 FE) MG tablet Take 325 mg by mouth every evening.    [provider]  furosemide (LASIX) 40  MG tablet Take daily as needed for edema or shortness of breath 07/20/21   Allred, Fayrene Fearing, MD  KLOR-CON M20 20 MEQ tablet TAKE 1 TABLET DAILY, AND 1 EXTRA TABLET ON DAYS       TAKING  METOLAZONE. 03/09/22   Fenton, Clint R, PA  losartan (COZAAR) 25 MG tablet Take 25 mg by mouth every evening.  04/26/17   [provider]  magnesium oxide (MAG-OX) 400 (240 Mg) MG tablet Take 400 mg by mouth daily. 05/28/21   [provider]  metFORMIN (GLUCOPHAGE) 500 MG tablet  04/03/22   [provider]  metolazone (ZAROXOLYN) 10 MG tablet Take by mouth. 08/25/21   [provider]  metoprolol succinate (TOPROL XL) 50 MG 24 hr tablet Take 1 tablet (50 mg total) by mouth daily. 04/26/22   Fenton, Clint R, PA  sildenafil (VIAGRA) 100 MG tablet Take 100 mg by mouth as needed. 04/03/21   [provider]  simvastatin (ZOCOR) 10 MG tablet Take 10 mg by mouth every evening.  03/18/17    [provider]  sitaGLIPtin (JANUVIA) 100 MG tablet  04/03/22   [provider]  tadalafil (CIALIS) 20 MG tablet Take 20 mg by mouth as needed. 04/03/21   [provider]  XARELTO 20 MG TABS tablet TAKE 1 TABLET DAILY WITH   SUPPER 09/29/20   Newman Nip, NP    Physical Exam    Vital Signs:  CAYDN CZAPLA does not have vital signs available for review today.  Given telephonic nature of communication, physical exam is limited. AAOx3. NAD. Normal affect.  Speech and respirations are unlabored.  Accessory Clinical Findings    None  Assessment & Plan    1.  Preoperative Cardiovascular Risk Assessment: According to the Revised Cardiac Risk Index (RCRI), his Perioperative Risk of Major Cardiac Event is (%): 0.9. His Functional Capacity in METs is: 6.61 according to the Duke Activity Status Index (DASI). The patient is doing well from a cardiac perspective. Therefore, based on ACC/AHA guidelines, the patient would be at acceptable risk for the planned procedure without further cardiovascular testing.   The patient was advised that if he develops new symptoms prior to surgery to contact our office to arrange for a follow-up visit, and he verbalized understanding.  Per office protocol, patient can hold Xarelto  for 3 days prior to procedure.    A copy of this note will be routed to requesting surgeon.  Time:   Today, I have spent 10 minutes with the patient with telehealth technology discussing medical history, symptoms, and management plan.    Levi Aland, NP-C  09/03/2022, 9:18 AM [address], [address] Office [phone] Fax [phone]

## 2022-09-14 ENCOUNTER — Encounter: Payer: Self-pay | Admitting: Cardiovascular Disease

## 2022-09-14 ENCOUNTER — Ambulatory Visit: Payer: 59 | Attending: Cardiovascular Disease | Admitting: Cardiovascular Disease

## 2022-09-14 VITALS — BP 112/72 | HR 75 | Ht 76.0 in | Wt 292.6 lb

## 2022-09-14 DIAGNOSIS — Z79899 Other long term (current) drug therapy: Secondary | ICD-10-CM

## 2022-09-14 DIAGNOSIS — D6869 Other thrombophilia: Secondary | ICD-10-CM

## 2022-09-14 DIAGNOSIS — I4819 Other persistent atrial fibrillation: Secondary | ICD-10-CM

## 2022-09-14 NOTE — Patient Instructions (Signed)
Medication Instructions:  Your physician recommends that you continue on your current medications as directed. Please refer to the Current Medication list given to you today. *If you need a refill on your cardiac medications before your next appointment, please call your pharmacy*    Follow-Up: At [facility], you and your health needs are our priority.  As part of our continuing mission to provide you with exceptional heart care, we have created designated Provider Care Teams.  These Care Teams include your primary Cardiologist (physician) and Advanced Practice Providers (APPs -  Physician Assistants and Nurse Practitioners) who all work together to provide you with the care you need, when you need it.  We recommend signing up for the patient portal called "MyChart".  Sign up information is provided on this After Visit Summary.  MyChart is used to connect with patients for Virtual Visits (Telemedicine).  Patients are able to view lab/test results, encounter notes, upcoming appointments, etc.  Non-urgent messages can be sent to your provider as well.   To learn more about what you can do with MyChart, go to [email or web address].    Your next appointment:   6 month(s)  Provider:   You will follow up in the Atrial Fibrillation Clinic located at [facility]. Your provider will be: Clint R. Fenton, PA-C

## 2022-09-14 NOTE — Progress Notes (Signed)
Cardiology Office Note:    Date:  09/14/2022   ID:  Philip [name], DOB [birth year], MRN [id number]  PCP:  Loni Beckwith, MD   [facility] Providers Cardiologist:  Rollene Rotunda, MD Electrophysiologist:  Maurice Small, MD     Referring MD: Loni Beckwith, MD   Chief complaint: follow-up for AF  History of Present Illness:    Philip [name] is a 62 y.o. male with a hx of bioprosthetic aortic valve replacement in 2005, gastric bypass, GI bleed with possible AVMs on capsule endoscopy who presents for follow-up regarding atrial fibrillation.  He had an aortic valve replacement in 2005 with a bioprosthetic valve. He has a history of AF and was managed with Tikosyn for a while. He has had multiple cardioversions detailed below. He eventually underwent ablation in Dec 2022 with WACA and posterior wall isolation.  He had recurrence of AF about a month after discontinuing Tikosyn. He was in [place] at the time. He was cardioverted in the local ER but AF recurred about 12 hours later.  He underwent Tikosyn load in October. He has been doing well and not sensed any recurrence of AF. He continues to wear his apple watch. He has not had any AF alerts.  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   Hx of bacterial endocarditis 11/2003   Hypertension    Neuropathy    feet bilat    Obesity     Persistent atrial fibrillation (HCC)    Sleep apnea    uses CPAP nightly   Toe deformity 08/2011   right claw hallux    Past Surgical History:  Procedure Laterality Date   adenoid surgery      AORTIC VALVE REPLACEMENT  11/2003   ATRIAL FIBRILLATION ABLATION N/A 03/19/2021   Procedure: ATRIAL FIBRILLATION ABLATION;  Surgeon: [name], MD;  Location: [facility] INVASIVE CV LAB;  Service: Cardiovascular;  Laterality: N/A;   CARDIOVERSION N/A 07/25/2017   Procedure: CARDIOVERSION;  Surgeon: Chilton Si, MD;  Location: [facility] ENDOSCOPY;  Service: Cardiovascular;  Laterality: N/A;   CARDIOVERSION N/A 09/07/2017   Procedure: CARDIOVERSION;  Surgeon: Chrystie Nose, MD;  Location: [facility] ENDOSCOPY;  Service: Cardiovascular;  Laterality: N/A;   CARDIOVERSION N/A 09/29/2017   Procedure: CARDIOVERSION;  Surgeon: Thurmon Fair, MD;  Location: [facility] ENDOSCOPY;  Service: Cardiovascular;  Laterality: N/A;   CARDIOVERSION N/A 12/26/2020   Procedure: CARDIOVERSION;  Surgeon: Wendall Stade, MD;  Location: [facility] ENDOSCOPY;  Service: Cardiovascular;  Laterality: N/A;   CARDIOVERSION N/A 01/28/2022   Procedure: CARDIOVERSION;  Surgeon: Chrystie Nose, MD;  Location: [facility] ENDOSCOPY;  Service: Cardiovascular;  Laterality: N/A;   COLONOSCOPY WITH PROPOFOL N/A 03/30/2017   Procedure: COLONOSCOPY WITH PROPOFOL;  Surgeon: Benancio Deeds, MD;  Location: [facility] ENDOSCOPY;  Service: Gastroenterology;  Laterality: N/A;   ESOPHAGOGASTRODUODENOSCOPY (EGD) WITH PROPOFOL N/A 03/29/2017  Procedure: ESOPHAGOGASTRODUODENOSCOPY (EGD) WITH PROPOFOL;  Surgeon: Benancio Deeds, MD;  Location: [facility] ENDOSCOPY;  Service: Gastroenterology;  Laterality: N/A;   GASTRIC BYPASS  03/2004   GIVENS CAPSULE STUDY N/A 03/30/2017   Procedure: GIVENS CAPSULE STUDY;  Surgeon: Benancio Deeds, MD;  Location: [facility] ENDOSCOPY;  Service: Gastroenterology;  Laterality: N/A;   KNEE ARTHROSCOPY  08/09/2007 - left   07/15/2003 - right   METATARSAL  OSTEOTOMY  03/20/2010   right 1st MT; gastroc soleus recession   NASAL SINUS SURGERY     TENDON RELEASE  09/09/2011   Procedure: HEEL CORD LENGTHENING;  Surgeon: Toni Arthurs, MD;  Location: [facility];  Service: Orthopedics;  Laterality: Right;  Righ tachilles tendon lengthening   TOE FUSION  07/12/2005   left 3rd toe PIP and DIP fusion   TONSILLECTOMY AND ADENOIDECTOMY     TOTAL KNEE ARTHROPLASTY Left 03/18/2015   Procedure: LEFT TOTAL KNEE ARTHROPLASTY;  Surgeon: Durene Romans, MD;  Location: [facility] ORS;  Service: Orthopedics;  Laterality: Left;    Current Medications: Current Meds  Medication Sig   Accu-Chek FastClix Lancets MISC 2 (two) times daily. as directed   ACCU-CHEK GUIDE test strip 2 (two) times daily.   Ascorbic Acid (VITAMIN C) 1000 MG tablet Take 1,000 mg by mouth in the morning.   Calcium Carb-Cholecalciferol (CALCIUM 600+D3 PO) Take 1 tablet by mouth in the morning and at bedtime.   cetirizine (ZYRTEC) 10 MG tablet Take 10 mg by mouth daily.    clindamycin (CLEOCIN) 150 MG capsule Take 600 mg by mouth See admin instructions. Take 4 tablets by mouth 1 hour prior to seeing the dentist   dofetilide (TIKOSYN) 125 MCG capsule TAKE 1 CAPSULE BY MOUTH 2 TIMES A DAY   esomeprazole (NEXIUM) 40 MG capsule Take 40 mg by mouth every evening.    ferrous sulfate 325 (65 FE) MG tablet Take 325 mg by mouth every evening.   furosemide (LASIX) 40 MG tablet Take daily as needed for edema or shortness of breath   KLOR-CON M20 20 MEQ tablet TAKE 1 TABLET DAILY, AND 1 EXTRA TABLET ON DAYS       TAKING  METOLAZONE.   losartan (COZAAR) 25 MG tablet Take 25 mg by mouth every evening.    magnesium oxide (MAG-OX) 400 (240 Mg) MG tablet Take 400 mg by mouth daily.   metFORMIN (GLUCOPHAGE) 500 MG tablet    metolazone (ZAROXOLYN) 10 MG tablet Take by mouth.   metoprolol succinate (TOPROL XL) 50 MG 24 hr tablet Take 1 tablet (50 mg total) by mouth daily.   MOUNJARO 5 MG/0.5ML Pen Inject 5 mg  into the skin once a week.   simvastatin (ZOCOR) 10 MG tablet Take 10 mg by mouth every evening.    traMADol (ULTRAM) 50 MG tablet Take 50 mg by mouth every 6 (six) hours as needed for moderate pain.   XARELTO 20 MG TABS tablet TAKE 1 TABLET DAILY WITH   SUPPER     Allergies:   Penicillins   Social History   Socioeconomic History   Marital status: Widowed    Spouse name: Not on file   Number of children: Not on file   Years of education: Not on file   Highest education level: Not on file  Occupational History   Not on file  Tobacco Use   Smoking status: Former    Packs/day: 1.00    Years: 2.00    Additional pack years: 0.00    Total  pack years: 2.00    Types: Cigars, Pipe, Cigarettes    Quit date: 01/31/2011    Years since quitting: 11.6   Smokeless tobacco: Never   Tobacco comments:    Former smoker (01/01/2021)  Vaping Use   Vaping Use: Never used  Substance and Sexual Activity   Alcohol use: Not Currently    Alcohol/week: 2.0 standard drinks of alcohol    Types: 2 Standard drinks or equivalent per week    Comment: stop drinking 04/21/22   Drug use: No   Sexual activity: Not on file  Other Topics Concern   Not on file  Social History Narrative   Not on file   Social Determinants of Health   Financial Resource Strain: Not on file  Food Insecurity: No Food Insecurity (01/27/2022)   Hunger Vital Sign    Worried About Running Out of Food in the Last Year: Never true    Ran Out of Food in the Last Year: Never true  Transportation Needs: No Transportation Needs (01/27/2022)   PRAPARE - Administrator, Civil Service (Medical): No    Lack of Transportation (Non-Medical): No  Physical Activity: Not on file  Stress: Not on file  Social Connections: Not on file     Family History: The patient's family history includes Alzheimer's disease in his mother; Colon cancer in his unknown relative; Diabetes in his father.  ROS:   Please see the history of  present illness.     All other systems reviewed and are negative.  EKGs/Labs/Other Studies Reviewed:    The following studies were reviewed today:  TTE 01/29/2022: 1. Left ventricular ejection fraction, by visual estimation, is 60 to  65%. The left ventricle has normal function. Normal left ventricular size.  There is no left ventricular hypertrophy.   2. Left ventricular diastolic Doppler parameters are consistent with  pseudonormalization pattern of LV diastolic filling.   3. Global right ventricle has normal systolic function.The right  ventricular size is mildly enlarged. No increase in right ventricular wall  thickness.   4. Left atrial size was moderately dilated.   5. Right atrial size was normal.   6. The mitral valve has been repaired/replaced. Mild mitral valve  regurgitation. No evidence of mitral stenosis.   7. Mitral valve annuloplasty repair. Mean gradient .   8. The tricuspid valve is normal in structure. Tricuspid valve  regurgitation is mild.   9. Aortic valve mean gradient measures 5.0 mmHg.  10. Aortic valve peak gradient measures 10.3 mmHg.  11. Aortic valve regurgitation was not visualized by color flow Doppler.  Structurally normal aortic valve, with no evidence of sclerosis or  stenosis.  12. Bioprosthetic aortic valve.  13. The pulmonic valve was normal in structure. Pulmonic valve  regurgitation is not visualized by color flow Doppler.  14. Normal pulmonary artery systolic pressure.  15. The inferior vena cava is normal in size with greater than 50%  respiratory variability, suggesting right atrial pressure of 3 mmHg.   EKG:  EKG is ordered today 01/18/2022, shows sinus rhythm with short PR, left axis deviation, QTc 491  Orders placed or performed during the hospital encounter of 04/21/22   EKG 12-Lead   EKG 12-Lead     Recent Labs: 04/21/2022: B Natriuretic Peptide 225.3; BUN 30; Creatinine, Ser 1.24; Magnesium 2.1; Potassium 4.1; Sodium 134          Physical Exam:    VS:  BP 112/72   Pulse 75   Ht  6\' 4"  (1.93 m)   Wt 292 lb 9.6 oz (132.7 kg)   SpO2 99%   BMI 35.62 kg/m     Wt Readings from Last 3 Encounters:  09/14/22 292 lb 9.6 oz (132.7 kg)  04/21/22 (!) 306 lb 9.6 oz (139.1 kg)  04/02/22 284 lb (128.8 kg)     GEN:  Well nourished, well developed in no acute distress CARDIAC: Irregular, rapid, no murmurs, rubs, gallops RESPIRATORY:  Normal work of breathing MUSCULOSKELETAL: + edema, appears chronic    ASSESSMENT & PLAN:    In order of problems listed above:  Atrial fibrillation: persistent. S/p ablation Dec 2022. Now with recurrence of persistent AF.  Tolerating Tikosyn. No evidence of recurrence on Tikosyn. Continue xarelto. High risk medication: on Tikosyn. Labs 04/2022 reviewed. He will have labs drawn by his PCP later this week.  Will arrange follow-up in A-fib clinic. Long term use of anticoagulation: continue xarelto indefinitely History of bioprosthetic aortic valve: follows with Dr. Antoine Poche        Medication Adjustments/Labs and Tests Ordered: Current medicines are reviewed at length with the patient today.  Concerns regarding medicines are outlined above.  No orders of the defined types were placed in this encounter.  No orders of the defined types were placed in this encounter.    Signed, Maurice Small, MD  09/14/2022 9:40 AM    [facility]

## 2022-09-17 ENCOUNTER — Encounter: Payer: Self-pay | Admitting: Cardiovascular Disease

## 2022-10-01 ENCOUNTER — Encounter (HOSPITAL_COMMUNITY): Payer: Self-pay

## 2022-10-25 ENCOUNTER — Other Ambulatory Visit (HOSPITAL_COMMUNITY): Payer: Self-pay | Admitting: Physician Assistant

## 2022-11-03 NOTE — Patient Instructions (Addendum)
SURGICAL WAITING ROOM VISITATION Patients having surgery or a procedure may have no more than 2 support people in the waiting area - these visitors may rotate.    Children under the age of 76 must have an adult with them who is not the patient.  If the patient needs to stay at the hospital during part of their recovery, the visitor guidelines for inpatient rooms apply. Pre-op nurse will coordinate an appropriate time for 1 support person to accompany patient in pre-op.  This support person may not rotate.    Please refer to the [facility] website for the visitor guidelines for Inpatients (after your surgery is over and you are in a regular room).       Your procedure is scheduled on: 11-26-22   Report to [facility] Main Entrance    Report to admitting at 7:20 AM   Call this number if you have problems the morning of surgery [phone]   Do not eat food :After Midnight.   After Midnight you may have the following liquids until 6:50 AM DAY OF SURGERY  Water Non-Citrus Juices (without pulp, NO RED-Apple, White grape, White cranberry) Black Coffee (NO MILK/CREAM OR CREAMERS, sugar ok)  Clear Tea (NO MILK/CREAM OR CREAMERS, sugar ok) regular and decaf                             Plain Jell-O (NO RED)                                           Fruit ices (not with fruit pulp, NO RED)                                     Popsicles (NO RED)                                                               Sports drinks like Gatorade (NO RED)                   The day of surgery:  Drink ONE (1) Pre-Surgery G2 at 6:50 AM the morning of surgery. Drink in one sitting. Do not sip.  This drink was given to you during your hospital  pre-op appointment visit. Nothing else to drink after completing the Pre-Surgery G2.          If you have questions, please contact your surgeon's office.   FOLLOW  ANY ADDITIONAL PRE OP INSTRUCTIONS YOU RECEIVED FROM YOUR SURGEON'S OFFICE!!!     Oral  Hygiene is also important to reduce your risk of infection.                                    Remember - BRUSH YOUR TEETH THE MORNING OF SURGERY WITH YOUR REGULAR TOOTHPASTE   Do NOT smoke after Midnight   Take these medicines the morning of surgery with A SIP OF WATER:   Zyrtec  Dofetilide  Metoprolol  Tramadol  if needed  How to Manage Your Diabetes Before and After Surgery  Why is it important to control my blood sugar before and after surgery? Improving blood sugar levels before and after surgery helps healing and can limit problems. A way of improving blood sugar control is eating a healthy diet by:  Eating less sugar and carbohydrates  Increasing activity/exercise  Talking with your doctor about reaching your blood sugar goals High blood sugars (greater than 180 mg/dL) can raise your risk of infections and slow your recovery, so you will need to focus on controlling your diabetes during the weeks before surgery. Make sure that the doctor who takes care of your diabetes knows about your planned surgery including the date and location.  How do I manage my blood sugar before surgery? Check your blood sugar at least 4 times a day, starting 2 days before surgery, to make sure that the level is not too high or low. Check your blood sugar the morning of your surgery when you wake up and every 2 hours until you get to the Short Stay unit. If your blood sugar is less than 70 mg/dL, you will need to treat for low blood sugar: Do not take insulin. Treat a low blood sugar (less than 70 mg/dL) with  cup of clear juice (cranberry or apple), 4 glucose tablets, OR glucose gel. Recheck blood sugar in 15 minutes after treatment (to make sure it is greater than 70 mg/dL). If your blood sugar is not greater than 70 mg/dL on recheck, call [phone] for further instructions. Report your blood sugar to the short stay nurse when you get to Short Stay.  If you are admitted to the hospital after  surgery: Your blood sugar will be checked by the staff and you will probably be given insulin after surgery (instead of oral diabetes medicines) to make sure you have good blood sugar levels. The goal for blood sugar control after surgery is 80-180 mg/dL.   WHAT DO I DO ABOUT MY DIABETES MEDICATION?  Do not take oral diabetes medicines (pills) the morning of surgery.  Hold Mounjaro 7 days before surgery (do not take after 11-18-22)  DO NOT TAKE THE FOLLOWING 7 DAYS PRIOR TO SURGERY: Ozempic, Wegovy, Rybelsus (Semaglutide), Byetta (exenatide), Bydureon (exenatide ER), Victoza, Saxenda (liraglutide), or Trulicity (dulaglutide) Mounjaro (Tirzepatide) Adlyxin (Lixisenatide), Polyethylene Glycol Loxenatide.  Reviewed and Endorsed by [facility] Patient Education Committee, August 2015  Bring CPAP mask and tubing day of surgery.                              You may not have any metal on your body including  jewelry, and body piercing             Do not wear lotions, powders, cologne, or deodorant              Men may shave face and neck.   Do not bring valuables to the hospital. [facility] IS NOT RESPONSIBLE   FOR VALUABLES.   Contacts, dentures or bridgework may not be worn into surgery.   Bring small overnight bag day of surgery.   DO NOT BRING YOUR HOME MEDICATIONS TO THE HOSPITAL. PHARMACY WILL DISPENSE MEDICATIONS LISTED ON YOUR MEDICATION LIST TO YOU DURING YOUR ADMISSION IN THE HOSPITAL!    Special Instructions: Bring a copy of your healthcare power of attorney and living will documents the day of surgery if you haven't  scanned them before.              Please read over the following fact sheets you were given: IF YOU HAVE QUESTIONS ABOUT YOUR PRE-OP INSTRUCTIONS PLEASE CALL [phone] [facility]  If you received a COVID test during your pre-op visit  it is requested that you wear a mask when out in public, stay away from anyone that may not be feeling well and notify your surgeon if  you develop symptoms. If you test positive for Covid or have been in contact with anyone that has tested positive in the last 10 days please notify you surgeon.   [facility]- Preparing for Total Shoulder Arthroplasty    Before surgery, you can play an important role. Because skin is not sterile, your skin needs to be as free of germs as possible. You can reduce the number of germs on your skin by using the following products. Benzoyl Peroxide Gel Reduces the number of germs present on the skin Applied twice a day to shoulder area starting two days before surgery    ==================================================================  Please follow these instructions carefully:  BENZOYL PEROXIDE 5% GEL  Please do not use if you have an allergy to benzoyl peroxide.   If your skin becomes reddened/irritated stop using the benzoyl peroxide.  Starting two days before surgery, apply as follows: Apply benzoyl peroxide in the morning and at night. Apply after taking a shower. If you are not taking a shower clean entire shoulder front, back, and side along with the armpit with a clean wet washcloth.  Place a quarter-sized dollop on your shoulder and rub in thoroughly, making sure to cover the front, back, and side of your shoulder, along with the armpit.   2 days before ____ AM   ____ PM              1 day before ____ AM   ____ PM                         Do this twice a day for two days.  (Last application is the night before surgery, AFTER using the CHG soap as described below).  Do NOT apply benzoyl peroxide gel on the day of surgery.    Pre-operative 5 CHG Bath Instructions   You can play a key role in reducing the risk of infection after surgery. Your skin needs to be as free of germs as possible. You can reduce the number of germs on your skin by washing with CHG (chlorhexidine gluconate) soap before surgery. CHG is an antiseptic soap that kills germs and continues to kill germs even  after washing.   DO NOT use if you have an allergy to chlorhexidine/CHG or antibacterial soaps. If your skin becomes reddened or irritated, stop using the CHG and notify one of our RNs at  [phone] .   Please shower with the CHG soap starting 4 days before surgery using the following schedule:     Please keep in mind the following:  DO NOT shave, including legs and underarms, starting the day of your first shower.   You may shave your face at any point before/day of surgery.  Place clean sheets on your bed the day you start using CHG soap. Use a clean washcloth (not used since being washed) for each shower. DO NOT sleep with pets once you start using the CHG.   CHG Shower Instructions:  If you choose to wash your  hair and private area, wash first with your normal shampoo/soap.  After you use shampoo/soap, rinse your hair and body thoroughly to remove shampoo/soap residue.  Turn the water OFF and apply about 3 tablespoons (45 ml) of CHG soap to a CLEAN washcloth.  Apply CHG soap ONLY FROM YOUR NECK DOWN TO YOUR TOES (washing for 3-5 minutes)  DO NOT use CHG soap on face, private areas, open wounds, or sores.  Pay special attention to the area where your surgery is being performed.  If you are having back surgery, having someone wash your back for you may be helpful. Wait 2 minutes after CHG soap is applied, then you may rinse off the CHG soap.  Pat dry with a clean towel  Put on clean clothes/pajamas   If you choose to wear lotion, please use ONLY the CHG-compatible lotions on the back of this paper.     Additional instructions for the day of surgery: DO NOT APPLY any lotions, deodorants, cologne, or perfumes.   Put on clean/comfortable clothes.  Brush your teeth.  Ask your nurse before applying any prescription medications to the skin.      CHG Compatible Lotions   Aveeno Moisturizing lotion  Cetaphil Moisturizing Cream  Cetaphil Moisturizing Lotion  Clairol Herbal  Essence Moisturizing Lotion, Dry Skin  Clairol Herbal Essence Moisturizing Lotion, Extra Dry Skin  Clairol Herbal Essence Moisturizing Lotion, Normal Skin  Curel Age Defying Therapeutic Moisturizing Lotion with Alpha Hydroxy  Curel Extreme Care Body Lotion  Curel Soothing Hands Moisturizing Hand Lotion  Curel Therapeutic Moisturizing Cream, Fragrance-Free  Curel Therapeutic Moisturizing Lotion, Fragrance-Free  Curel Therapeutic Moisturizing Lotion, Original Formula  Eucerin Daily Replenishing Lotion  Eucerin Dry Skin Therapy Plus Alpha Hydroxy Crme  Eucerin Dry Skin Therapy Plus Alpha Hydroxy Lotion  Eucerin Original Crme  Eucerin Original Lotion  Eucerin Plus Crme Eucerin Plus Lotion  Eucerin TriLipid Replenishing Lotion  Keri Anti-Bacterial Hand Lotion  Keri Deep Conditioning Original Lotion Dry Skin Formula Softly Scented  Keri Deep Conditioning Original Lotion, Fragrance Free Sensitive Skin Formula  Keri Lotion Fast Absorbing Fragrance Free Sensitive Skin Formula  Keri Lotion Fast Absorbing Softly Scented Dry Skin Formula  Keri Original Lotion  Keri Skin Renewal Lotion Keri Silky Smooth Lotion  Keri Silky Smooth Sensitive Skin Lotion  Nivea Body Creamy Conditioning Oil  Nivea Body Extra Enriched Teacher, adult education Moisturizing Lotion Nivea Crme  Nivea Skin Firming Lotion  NutraDerm 30 Skin Lotion  NutraDerm Skin Lotion  NutraDerm Therapeutic Skin Cream  NutraDerm Therapeutic Skin Lotion  ProShield Protective Hand Cream  Provon moisturizing lotion   PATIENT SIGNATURE_________________________________  NURSE SIGNATURE__________________________________  ________________________________________________________________________

## 2022-11-03 NOTE — Progress Notes (Signed)
COVID Vaccine Completed:  Yes  Date of COVID positive in last 90 days:  PCP - Bridgette Habermann, MD Cardiologist - Rollene Rotunda, MD  Chest x-ray - 03-25-22 [facility] EKG - 09-14-22 Epic Stress Test - 08-25-17 Epic ECHO - 01-19-21 Epic Cardiac Cath - 11-25-03 Epic Pacemaker/ICD device last checked: Spinal Cord Stimulator: Afib ablation - 03-19-21 Epic Cardiac CT - 03-13-21 Cardiac Monitor - 03-29-22 CEW  Bowel Prep -   Sleep Study - Yes, +sleep apnea CPAP -   Fasting Blood Sugar -  Checks Blood Sugar _____ times a day  Mounjaro Last dose of GLP1 agonist-  do not take after 11-18-22 GLP1 instructions:  Hold 7 days prior to surgery    Last dose of SGLT-2 inhibitors-  N/A SGLT-2 instructions: N/A   Blood Thinner Instructions:  Xarelto Aspirin Instructions: Last Dose:  Activity level:  Can go up a flight of stairs and perform activities of daily living without stopping and without symptoms of chest pain or shortness of breath.  Able to exercise without symptoms  Unable to go up a flight of stairs without symptoms of     Anesthesia review:  CHF, Afib, HTN, mitral valve disorder, OSA, DM S/P AVR  Patient denies shortness of breath, fever, cough and chest pain at PAT appointment  Patient verbalized understanding of instructions that were given to them at the PAT appointment. Patient was also instructed that they will need to review over the PAT instructions again at home before surgery.

## 2022-11-08 NOTE — Progress Notes (Signed)
Sent message, via epic in basket, requesting orders in epic from surgeon.  

## 2022-11-15 ENCOUNTER — Encounter (HOSPITAL_COMMUNITY)
Admission: RE | Admit: 2022-11-15 | Discharge: 2022-11-15 | Disposition: A | Discharge status: Home / Self Care | Payer: 59 | Source: Ambulatory Visit | Attending: Orthopedic Surgery | Admitting: Orthopedic Surgery

## 2022-11-15 ENCOUNTER — Other Ambulatory Visit: Payer: Self-pay

## 2022-11-15 ENCOUNTER — Encounter (HOSPITAL_COMMUNITY): Payer: Self-pay

## 2022-11-15 VITALS — BP 133/83 | HR 70 | Temp 97.7°F | Ht 76.0 in | Wt 296.0 lb

## 2022-11-15 DIAGNOSIS — Z87891 Personal history of nicotine dependence: Secondary | ICD-10-CM | POA: Insufficient documentation

## 2022-11-15 DIAGNOSIS — I10 Essential (primary) hypertension: Secondary | ICD-10-CM | POA: Diagnosis not present

## 2022-11-15 DIAGNOSIS — Z7985 Long-term (current) use of injectable non-insulin antidiabetic drugs: Secondary | ICD-10-CM | POA: Diagnosis not present

## 2022-11-15 DIAGNOSIS — G4730 Sleep apnea, unspecified: Secondary | ICD-10-CM | POA: Insufficient documentation

## 2022-11-15 DIAGNOSIS — I4891 Unspecified atrial fibrillation: Secondary | ICD-10-CM | POA: Diagnosis not present

## 2022-11-15 DIAGNOSIS — M19012 Primary osteoarthritis, left shoulder: Secondary | ICD-10-CM | POA: Diagnosis not present

## 2022-11-15 DIAGNOSIS — Z01812 Encounter for preprocedural laboratory examination: Secondary | ICD-10-CM | POA: Insufficient documentation

## 2022-11-15 DIAGNOSIS — E119 Type 2 diabetes mellitus without complications: Secondary | ICD-10-CM | POA: Insufficient documentation

## 2022-11-15 DIAGNOSIS — Z953 Presence of xenogenic heart valve: Secondary | ICD-10-CM | POA: Diagnosis not present

## 2022-11-15 DIAGNOSIS — Z01818 Encounter for other preprocedural examination: Secondary | ICD-10-CM

## 2022-11-15 DIAGNOSIS — Z7984 Long term (current) use of oral hypoglycemic drugs: Secondary | ICD-10-CM | POA: Insufficient documentation

## 2022-11-15 HISTORY — DX: Anemia, unspecified: D64.9

## 2022-11-15 LAB — BASIC METABOLIC PANEL
Anion gap: 7 (ref 5–15)
BUN: 18 mg/dL (ref 8–23)
CO2: 26 mmol/L (ref 22–32)
Calcium: 9 mg/dL (ref 8.9–10.3)
Chloride: 105 mmol/L (ref 98–111)
Creatinine, Ser: 1.11 mg/dL (ref 0.61–1.24)
GFR, Estimated: >60 mL/min (ref >60)
Glucose, Bld: 110 mg/dL — ABNORMAL HIGH (ref 70–99)
Potassium: 4.6 mmol/L (ref 3.5–5.1)
Sodium: 138 mmol/L (ref 135–145)

## 2022-11-15 LAB — CBC
HCT: 38.2 % — ABNORMAL LOW (ref 39.0–52.0)
Hemoglobin: 11.8 g/dL — ABNORMAL LOW (ref 13.0–17.0)
MCH: 29.9 pg (ref 26.0–34.0)
MCHC: 30.9 g/dL (ref 30.0–36.0)
MCV: 96.7 fL (ref 80.0–100.0)
Platelets: 159 10*3/uL (ref 150–400)
RBC: 3.95 MIL/uL — ABNORMAL LOW (ref 4.22–5.81)
RDW: 15.3 % (ref 11.5–15.5)
WBC: 5.8 10*3/uL (ref 4.0–10.5)
nRBC: 0 % (ref 0.0–0.2)

## 2022-11-15 LAB — SURGICAL PCR SCREEN
MRSA, PCR: NEGATIVE
Staphylococcus aureus: NEGATIVE

## 2022-11-15 LAB — GLUCOSE, CAPILLARY: Glucose-Capillary: 87 mg/dL (ref 70–99)

## 2022-11-15 NOTE — Progress Notes (Signed)
Pt. Does not have instructions for Xarelto yet,he has an appointment coming with cardiologist next week.Last dose of Mounjaro: 11/14/22.

## 2022-11-17 NOTE — Anesthesia Preprocedure Evaluation (Addendum)
Anesthesia Evaluation  Patient identified by MRN, date of birth, ID band Patient awake    Reviewed: Allergy & Precautions, NPO status , Patient's Chart, lab work & pertinent test results  Airway Mallampati: II  TM Distance: >3 FB Neck ROM: Full    Dental no notable dental hx.    Pulmonary sleep apnea and Continuous Positive Airway Pressure Ventilation , former smoker   Pulmonary exam normal        Cardiovascular hypertension, Pt. on medications and Pt. on home beta blockers +CHF  Normal cardiovascular exam+ dysrhythmias Atrial Fibrillation   ECHO: 1. Left ventricular ejection fraction, by estimation, is 60 to 65%. The  left ventricle has normal function. The left ventricle has no regional  wall motion abnormalities. There is mild asymmetric left ventricular  hypertrophy of the basal and septal  segments. Left ventricular diastolic parameters are indeterminate.   2. Right ventricular systolic function is normal. The right ventricular  size is normal.   3. Left atrial size was moderately dilated.   4. The mitral valve is abnormal. Mild mitral valve regurgitation. No  evidence of mitral stenosis.   5. Tricuspid valve regurgitation is mild to moderate.   6. The aortic valve is tricuspid. There is moderate calcification of the  aortic valve. There is moderate thickening of the aortic valve. Aortic  valve regurgitation is not visualized. Sclerosis without stenosis.   7. The inferior vena cava is normal in size with greater than 50%  respiratory variability, suggesting right atrial pressure of 3 mmHg.     Neuro/Psych  Neuromuscular disease  negative psych ROS   GI/Hepatic Neg liver ROS, PUD,GERD  Medicated and Controlled,,  Endo/Other  diabetes, Oral Hypoglycemic Agents  Patient on GLP-1 Agonist  Renal/GU negative Renal ROS     Musculoskeletal  (+) Arthritis ,    Abdominal  (+) + obese  Peds  Hematology  (+) Blood  dyscrasia (Xarelto), anemia   Anesthesia Other Findings left shoulder osteoarthritis  Reproductive/Obstetrics                              Anesthesia Physical Anesthesia Plan  ASA: 3  Anesthesia Plan: General and Regional   Post-op Pain Management:    Induction: Intravenous  PONV Risk Score and Plan: 2 and Ondansetron, Dexamethasone, Midazolam and Treatment may vary due to age or medical condition  Airway Management Planned: Oral ETT  Additional Equipment:   Intra-op Plan:   Post-operative Plan: Extubation in OR  Informed Consent: I have reviewed the patients History and Physical, chart, labs and discussed the procedure including the risks, benefits and alternatives for the proposed anesthesia with the patient or authorized representative who has indicated his/her understanding and acceptance.     Dental advisory given  Plan Discussed with: CRNA  Anesthesia Plan Comments: (PAT note 11/15/2022)        Anesthesia Quick Evaluation

## 2022-11-17 NOTE — Progress Notes (Signed)
Anesthesia Chart Review   Case: [id number] Date/Time: 11/26/22 0936   Procedure: ANATOMIC TOTAL SHOULDER ARTHROPLASTY (Left: Shoulder)   Anesthesia type: Choice   Pre-op diagnosis: left shoulder osteoarthritis   Location: [facility] / [facility]   Surgeons: Beverely Low, MD       DISCUSSION:61 y.o. former smoker with h/o HTN, sleep apnea with CPAP, atrial fibrillation, s/p AV and MV replacement 2005, left shoulder OA scheduled for above procedure 11/26/2022 with Dr. Beverely Low.   Pt last seen by cardiology 09/03/2022. Per OV note, "Preoperative Cardiovascular Risk Assessment: According to the Revised Cardiac Risk Index (RCRI), his Perioperative Risk of Major Cardiac Event is (%): 0.9. His Functional Capacity in METs is: 6.61 according to the Duke Activity Status Index (DASI). The patient is doing well from a cardiac perspective. Therefore, based on ACC/AHA guidelines, the patient would be at acceptable risk for the planned procedure without further cardiovascular testing.    The patient was advised that if he develops new symptoms prior to surgery to contact our office to arrange for a follow-up visit, and he verbalized understanding.   Per office protocol, patient can hold Xarelto  for 3 days prior to procedure."  VS: BP 133/83   Pulse 70   Temp 36.5 C (Oral)   Ht 6\' 4"  (1.93 m)   Wt 134.3 kg   SpO2 100%   BMI 36.03 kg/m   PROVIDERS: Loni Beckwith, MD is PCP   Cardiologist:  Rollene Rotunda, MD  Electrophysiologist:  Maurice Small, MD     LABS: Labs reviewed: Acceptable for surgery. (all labs ordered are listed, but only abnormal results are displayed)  Labs Reviewed  HEMOGLOBIN A1C - Abnormal; Notable for the following components:      Result Value   Hgb A1c MFr Bld 5.8 (*)    All other components within normal limits  BASIC METABOLIC PANEL - Abnormal; Notable for the following components:   Glucose, Bld 110 (*)    All other components within normal limits  CBC -  Abnormal; Notable for the following components:   RBC 3.95 (*)    Hemoglobin 11.8 (*)    HCT 38.2 (*)    All other components within normal limits  SURGICAL PCR SCREEN  GLUCOSE, CAPILLARY     IMAGES:   EKG:   CV: Echo 01/19/2021 1. Left ventricular ejection fraction, by estimation, is 60 to 65%. The  left ventricle has normal function. The left ventricle has no regional  wall motion abnormalities. There is mild asymmetric left ventricular  hypertrophy of the basal and septal  segments. Left ventricular diastolic parameters are indeterminate.   2. Right ventricular systolic function is normal. The right ventricular  size is normal.   3. Left atrial size was moderately dilated.   4. The mitral valve is abnormal. Mild mitral valve regurgitation. No  evidence of mitral stenosis.   5. Tricuspid valve regurgitation is mild to moderate.   6. The aortic valve is tricuspid. There is moderate calcification of the  aortic valve. There is moderate thickening of the aortic valve. Aortic  valve regurgitation is not visualized. Sclerosis without stenosis.   7. The inferior vena cava is normal in size with greater than 50%  respiratory variability, suggesting right atrial pressure of 3 mmHg.  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   Hx of bacterial endocarditis 11/2003   Hypertension    Neuropathy    feet bilat    Obesity    Persistent atrial fibrillation (HCC)    Pneumonia    Sleep apnea    uses CPAP nightly   Toe deformity 08/2011   right claw hallux    Past Surgical History:  Procedure  Laterality Date   adenoid surgery      AORTIC VALVE REPLACEMENT  11/2003   ATRIAL FIBRILLATION ABLATION N/A 03/19/2021   Procedure: ATRIAL FIBRILLATION ABLATION;  Surgeon: [name], MD;  [facility]: [facility];  Service: Cardiovascular;  Laterality: N/A;   CARDIOVERSION N/A 07/25/2017   Procedure: CARDIOVERSION;  Surgeon: Chilton Si, MD;  Location: [facility] ENDOSCOPY;  Service: Cardiovascular;  Laterality: N/A;   CARDIOVERSION N/A 09/07/2017   Procedure: CARDIOVERSION;  Surgeon: Chrystie Nose, MD;  Location: [facility] ENDOSCOPY;  Service: Cardiovascular;  Laterality: N/A;   CARDIOVERSION N/A 09/29/2017   Procedure: CARDIOVERSION;  Surgeon: Thurmon Fair, MD;  Location: [facility] ENDOS[facility];  Service: Cardiovascular;  Laterality: N/A;   CARDIOVERSION N/A 12/26/2020   Procedure: CARDIOVERSION;  Surgeon: Wendall Stade, MD;  Location: [facility] ENDOSCOPY;  Service: Cardiovascular;  Laterality: N/A;   CARDIOVERSION N/A 01/28/2022   Procedure: CARDIOVERSION;  Surgeon: Chrystie Nose, MD;  Location: [facility] ENDOSCOPY;  Service: Cardiovascular;  Laterality: N/A;   COLONOSCOPY WITH PROPOFOL N/A 03/30/2017   Procedure: COLONOSCOPY WITH PROPOFOL;  Surgeon: Benancio Deeds, MD;  Location: [facility];  Service: Gastroenterology;  Laterality: N/A;   ESOPHAGOGASTRODUODENOSCOPY (EGD) WITH PROPOFOL N/A 03/29/2017   Procedure: ESOPHAGOGASTRODUODENOSCOPY (EGD) WITH PROPOFOL;  Surgeon: Benancio Deeds, MD;  Location: [facility];  Service: Gastroenterology;  Laterality: N/A;   GASTRIC BYPASS  03/2004   GIVENS CAPSULE STUDY N/A 03/30/2017   Procedure: GIVENS CAPSULE STUDY;  Surgeon: Benancio Deeds, MD;  Location: [facility];  Service: Gastroenterology;  Laterality: N/A;   KNEE ARTHROSCOPY  08/09/2007 - left   07/15/2003 - right   METATARSAL OSTEOTOMY  03/20/2010   right 1st MT; gastroc soleus recession   NASAL SINUS SURGERY     TENDON RELEASE  09/09/2011   Procedure: HEEL CORD LENGTHENING;  Surgeon: Toni Arthurs, MD;  Location: [facility];  Service: Orthopedics;  Laterality: Right;  Righ tachilles tendon lengthening   TOE FUSION  07/12/2005   left 3rd toe PIP and DIP fusion   TONSILLECTOMY AND ADENOIDECTOMY     TOTAL KNEE ARTHROPLASTY Left 03/18/2015   Procedure: LEFT TOTAL KNEE ARTHROPLASTY;  Surgeon: Durene Romans, MD;  Location: [facility];  Service: Orthopedics;  Laterality: Left;    MEDICATIONS:  Accu-Chek FastClix Lancets MISC   ACCU-CHEK GUIDE test strip   acetaminophen (TYLENOL) 500 MG tablet   Ascorbic Acid (VITAMIN C) 1000 MG tablet   BELSOMRA 5 MG TABS   Calcium Carb-Cholecalciferol (CALCIUM 600+D3 PO)   cetirizine (ZYRTEC) 10 MG tablet   clindamycin (CLEOCIN) 150 MG capsule   dofetilide (TIKOSYN) 125 MCG capsule   esomeprazole (NEXIUM) 40 MG capsule   ferrous sulfate 325 (65 FE) MG tablet   furosemide (LASIX) 40 MG  tablet   IODINE EX   KLOR-CON M20 20 MEQ tablet   losartan (COZAAR) 25 MG tablet   magnesium oxide (MAG-OX) 400 (240 Mg) MG tablet   melatonin 5 MG TABS   metFORMIN (GLUCOPHAGE-XR) 500 MG 24 hr tablet   metolazone (ZAROXOLYN) 10 MG tablet   metoprolol succinate (TOPROL XL) 50 MG 24 hr tablet   MOUNJARO 5 MG/0.5ML Pen   simvastatin (ZOCOR) 10 MG tablet   traMADol (ULTRAM) 50 MG tablet   XARELTO 20 MG TABS tablet   No current facility-administered medications for this encounter.    Jodell Cipro Ward, PA-C [facility] [phone]

## 2022-11-23 NOTE — H&P (Signed)
Patient's anticipated LOS is less than 2 midnights, meeting these requirements: - Younger than 79 - Lives within 1 hour of care - Has a competent adult at home to recover with post-op recover - NO history of  - Chronic pain requiring opiods  - Diabetes  - Coronary Artery Disease  - Heart failure  - Heart attack  - Stroke  - DVT/VTE  - Cardiac arrhythmia  - Respiratory Failure/COPD  - Renal failure  - Anemia  - Advanced Liver disease     Philip [name] is an 62 y.o. male.    Chief Complaint: left shoulder pain  HPI: Pt is a 62 y.o. male complaining of left shoulder pain for multiple years. Pain had continually increased since the beginning. X-rays in the clinic show end-stage arthritic changes of the left shoulder. Pt has tried various conservative treatments which have failed to alleviate their symptoms, including injections and therapy. Various options are discussed with the patient. Risks, benefits and expectations were discussed with the patient. Patient understand the risks, benefits and expectations and wishes to proceed with surgery.   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   Hx of bacterial endocarditis 11/2003   Hypertension    Neuropathy    feet bilat    Obesity    Persistent atrial fibrillation (HCC)    Pneumonia    Sleep apnea    uses CPAP nightly   Toe deformity 08/2011   right claw hallux    PSH: Past Surgical History:  Procedure Laterality Date   adenoid surgery      AORTIC VALVE REPLACEMENT  11/2003    ATRIAL FIBRILLATION ABLATION N/A 03/19/2021   Procedure: ATRIAL FIBRILLATION ABLATION;  Surgeon: [name], MD;  [facility];  Service: Cardiovascular;  Laterality: N/A;   CARDIOVERSION N/A 07/25/2017   Procedure: CARDIOVERSION;  Surgeon: Chilton Si, MD;  Location: [facility] ENDOSCOPY;  Service: Cardiovascular;  Laterality: N/A;   CARDIOVERSION N/A 09/07/2017   Procedure: CARDIOVERSION;  Surgeon: Chrystie Nose, MD;  Location: [facility] ENDOSCOPY;  Service: Cardiovascular;  Laterality: N/A;   CARDIOVERSION N/A 09/29/2017   Procedure: CARDIOVERSION;  Surgeon: Thurmon Fair, MD;  Location: [facility];  Service: Cardiovascular;  Laterality: N/A;   CARDIOVERSION N/A 12/26/2020   Procedure: CARDIOVERSION;  Surgeon: Wendall Stade, MD;  Location: [facility] ENDOSCOPY;  Service: Cardiovascular;  Laterality: N/A;   CARDIOVERSION N/A 01/28/2022   Procedure: CARDIOVERSION;  Surgeon: Chrystie Nose, MD;  Location: [facility] ENDOSCOPY;  Service: Cardiovascular;  Laterality: N/A;   COLONOSCOPY WITH PROPOFOL N/A 03/30/2017   Procedure: COLONOSCOPY WITH PROPOFOL;  Surgeon: Benancio Deeds, MD;  Location: [facility];  Service: Gastroenterology;  Laterality: N/A;   ESOPHAGOGASTRODUODENOSCOPY (EGD) WITH PROPOFOL N/A 03/29/2017   Procedure: ESOPHAGOGASTRODUODENOSCOPY (EGD) WITH PROPOFOL;  Surgeon: Benancio Deeds, MD;  Location: [facility];  Service: Gastroenterology;  Laterality: N/A;   GASTRIC BYPASS  03/2004  GIVENS CAPSULE STUDY N/A 03/30/2017   Procedure: GIVENS CAPSULE STUDY;  Surgeon: Benancio Deeds, MD;  Location: [facility];  Service: Gastroenterology;  Laterality: N/A;   KNEE ARTHROSCOPY  08/09/2007 - left   07/15/2003 - right   METATARSAL OSTEOTOMY  03/20/2010   right 1st MT; gastroc soleus recession   NASAL SINUS SURGERY     TENDON RELEASE  09/09/2011   Procedure: HEEL CORD LENGTHENING;  Surgeon: Toni Arthurs, MD;  Location: [facility];  Service: Orthopedics;   Laterality: Right;  Righ tachilles tendon lengthening   TOE FUSION  07/12/2005   left 3rd toe PIP and DIP fusion   TONSILLECTOMY AND ADENOIDECTOMY     TOTAL KNEE ARTHROPLASTY Left 03/18/2015   Procedure: LEFT TOTAL KNEE ARTHROPLASTY;  Surgeon: Durene Romans, MD;  Location: [facility];  Service: Orthopedics;  Laterality: Left;    Social History:  reports that he quit smoking about 11 years ago. His smoking use included cigars, pipe, and cigarettes. He started smoking about 13 years ago. He has a 2 pack-year smoking history. He has never used smokeless tobacco. He reports current alcohol use of about 2.0 standard drinks of alcohol per week. He reports that he does not use drugs. BMI: Estimated body mass index is 36.03 kg/m as calculated from the following:   Height as of 11/15/22: 6\' 4"  (1.93 m).   Weight as of 11/15/22: 134.3 kg.  Lab Results  Component Value Date   ALBUMIN 3.4 (L) 03/29/2017   Diabetes:   Patient has a diagnosis of diabetes,  Lab Results  Component Value Date   HGBA1C 5.8 (H) 11/15/2022   Smoking Status:      Allergies:  Allergies  Allergen Reactions   Penicillins Hives, Swelling and Other (See Comments)          Medications: No current facility-administered medications for this encounter.   Current Outpatient Medications  Medication Sig Dispense Refill   acetaminophen (TYLENOL) 500 MG tablet Take 1,000 mg by mouth every 6 (six) hours as needed for moderate pain.     Ascorbic Acid (VITAMIN C) 1000 MG tablet Take 1,000 mg by mouth in the morning.     BELSOMRA 5 MG TABS Take 5 tablets by mouth at bedtime.     Calcium Carb-Cholecalciferol (CALCIUM 600+D3 PO) Take 1 tablet by mouth in the morning and at bedtime.     cetirizine (ZYRTEC) 10 MG tablet Take 10 mg by mouth daily.      clindamycin (CLEOCIN) 150 MG capsule Take 600 mg by mouth See admin instructions. Take 4 tablets by mouth 1 hour prior to seeing the dentist     dofetilide (TIKOSYN) 125 MCG capsule TAKE  1 CAPSULE BY MOUTH 2 TIMES A DAY 60 capsule 3   esomeprazole (NEXIUM) 40 MG capsule Take 40 mg by mouth every evening.      ferrous sulfate 325 (65 FE) MG tablet Take 325 mg by mouth every evening.     furosemide (LASIX) 40 MG tablet Take daily as needed for edema or shortness of breath (Patient taking differently: Take 40 mg by mouth daily.) 30 tablet 6   IODINE EX Apply 1 Application topically daily.     KLOR-CON M20 20 MEQ tablet TAKE 1 TABLET DAILY, AND 1 EXTRA TABLET ON DAYS       TAKING  METOLAZONE. 95 tablet 2   losartan (COZAAR) 25 MG tablet Take 25 mg by mouth every evening.      magnesium oxide (MAG-OX) 400 (  240 Mg) MG tablet Take 400 mg by mouth daily.     melatonin 5 MG TABS Take 5 mg by mouth at bedtime.     metFORMIN (GLUCOPHAGE-XR) 500 MG 24 hr tablet Take 1,000 mg by mouth daily.     metolazone (ZAROXOLYN) 10 MG tablet Take 10 mg by mouth daily as needed (swelling).     metoprolol succinate (TOPROL XL) 50 MG 24 hr tablet Take 1 tablet (50 mg total) by mouth daily. 90 tablet 2   MOUNJARO 5 MG/0.5ML Pen Inject 5 mg into the skin every Sunday.     simvastatin (ZOCOR) 10 MG tablet Take 10 mg by mouth every evening.      traMADol (ULTRAM) 50 MG tablet Take 50 mg by mouth every 6 (six) hours as needed for moderate pain.     XARELTO 20 MG TABS tablet TAKE 1 TABLET DAILY WITH   SUPPER 90 tablet 3   Accu-Chek FastClix Lancets MISC 2 (two) times daily. as directed     ACCU-CHEK GUIDE test strip 2 (two) times daily.      No results found for this or any previous visit (from the past 48 hour(s)). No results found.  ROS: Pain with rom of the left upper extremity  Physical Exam: Alert and oriented 62 y.o. male in no acute distress Cranial nerves 2-12 intact Cervical spine: full rom with no tenderness, nv intact distally Chest: active breath sounds bilaterally, no wheeze rhonchi or rales Heart: regular rate and rhythm, no murmur Abd: non tender non distended with active bowel  sounds Hip is stable with rom  Left shoulder painful rom with crepitus Strength of ER and IR 5/5 bilaterally with some pain No rashes or edema distally  Assessment/Plan Assessment: left shoulder end stage osteoarthritis  Plan:  Patient will undergo a left total shoulder by Dr. Ranell Patrick at [place] Risks benefits and expectations were discussed with the patient. Patient understand risks, benefits and expectations and wishes to proceed. Preoperative templating of the joint replacement has been completed, documented, and submitted to the Operating Room personnel in order to optimize intra-operative equipment management.   Alphonsa Overall PA-C, MPAS [facility] is now [employer] [address], [address], [place], [place] [address] Phone: [phone] [email or web address] Facebook  [employer]

## 2022-11-26 ENCOUNTER — Encounter (HOSPITAL_COMMUNITY)
Admission: RE | Disposition: A | Discharge status: Home / Self Care | Payer: Self-pay | Source: Home / Self Care | Attending: Orthopedic Surgery

## 2022-11-26 ENCOUNTER — Other Ambulatory Visit: Payer: Self-pay

## 2022-11-26 ENCOUNTER — Encounter (HOSPITAL_COMMUNITY): Payer: Self-pay | Admitting: Orthopedic Surgery

## 2022-11-26 ENCOUNTER — Ambulatory Visit (HOSPITAL_COMMUNITY): Payer: 59 | Admitting: Certified Registered"

## 2022-11-26 ENCOUNTER — Ambulatory Visit (HOSPITAL_COMMUNITY): Payer: 59 | Admitting: Physician Assistant

## 2022-11-26 ENCOUNTER — Observation Stay (HOSPITAL_COMMUNITY)
Admission: RE | Admit: 2022-11-26 | Discharge: 2022-11-27 | Disposition: A | Discharge status: Home / Self Care | Payer: 59 | Source: Home / Self Care | Attending: Orthopedic Surgery | Admitting: Orthopedic Surgery

## 2022-11-26 ENCOUNTER — Ambulatory Visit (HOSPITAL_COMMUNITY): Payer: 59

## 2022-11-26 DIAGNOSIS — E119 Type 2 diabetes mellitus without complications: Secondary | ICD-10-CM | POA: Insufficient documentation

## 2022-11-26 DIAGNOSIS — Z79899 Other long term (current) drug therapy: Secondary | ICD-10-CM | POA: Insufficient documentation

## 2022-11-26 DIAGNOSIS — M19012 Primary osteoarthritis, left shoulder: Secondary | ICD-10-CM | POA: Diagnosis present

## 2022-11-26 DIAGNOSIS — I4819 Other persistent atrial fibrillation: Secondary | ICD-10-CM | POA: Diagnosis not present

## 2022-11-26 DIAGNOSIS — Z96652 Presence of left artificial knee joint: Secondary | ICD-10-CM | POA: Diagnosis not present

## 2022-11-26 DIAGNOSIS — Z87891 Personal history of nicotine dependence: Secondary | ICD-10-CM

## 2022-11-26 DIAGNOSIS — Z7901 Long term (current) use of anticoagulants: Secondary | ICD-10-CM | POA: Diagnosis not present

## 2022-11-26 DIAGNOSIS — Z96612 Presence of left artificial shoulder joint: Principal | ICD-10-CM

## 2022-11-26 DIAGNOSIS — Z7984 Long term (current) use of oral hypoglycemic drugs: Secondary | ICD-10-CM | POA: Diagnosis not present

## 2022-11-26 DIAGNOSIS — I110 Hypertensive heart disease with heart failure: Secondary | ICD-10-CM

## 2022-11-26 DIAGNOSIS — I5032 Chronic diastolic (congestive) heart failure: Secondary | ICD-10-CM

## 2022-11-26 DIAGNOSIS — I10 Essential (primary) hypertension: Secondary | ICD-10-CM | POA: Diagnosis not present

## 2022-11-26 HISTORY — PX: TOTAL SHOULDER ARTHROPLASTY: SHX126

## 2022-11-26 LAB — GLUCOSE, CAPILLARY
Glucose-Capillary: 99 mg/dL (ref 70–99)
Glucose-Capillary: 110 mg/dL — ABNORMAL HIGH (ref 70–99)
Glucose-Capillary: 315 mg/dL — ABNORMAL HIGH (ref 70–99)
Glucose-Capillary: 152 mg/dL — ABNORMAL HIGH (ref 70–99)
Glucose-Capillary: 127 mg/dL — ABNORMAL HIGH (ref 70–99)

## 2022-11-26 SURGERY — ARTHROPLASTY, SHOULDER, TOTAL
Anesthesia: Regional | Site: Shoulder | Laterality: Left

## 2022-11-26 MED ORDER — METHOCARBAMOL 500 MG PO TABS
500.0000 mg | ORAL_TABLET | Freq: Four times a day (QID) | ORAL | Status: DC | PRN
  Administered 2022-11-26 – 2022-11-27 (×2): 500 mg via ORAL
  Filled 2022-11-26 (×2): qty 1

## 2022-11-26 MED ORDER — BUPIVACAINE HCL (PF) 0.5 % IJ SOLN
INTRAMUSCULAR | Status: DC | PRN
  Administered 2022-11-26: 15 mL via PERINEURAL

## 2022-11-26 MED ORDER — ONDANSETRON HCL 4 MG/2ML IJ SOLN: 4.0000 mg | Freq: Four times a day (QID) | INTRAMUSCULAR | Status: DC | PRN

## 2022-11-26 MED ORDER — DEXAMETHASONE SODIUM PHOSPHATE 10 MG/ML IJ SOLN
INTRAMUSCULAR | Status: AC
  Filled 2022-11-26: qty 1

## 2022-11-26 MED ORDER — RIVAROXABAN 10 MG PO TABS
20.0000 mg | ORAL_TABLET | Freq: Every day | ORAL | Status: DC
  Administered 2022-11-27: 20 mg via ORAL
  Filled 2022-11-26: qty 2

## 2022-11-26 MED ORDER — POLYETHYLENE GLYCOL 3350 17 G PO PACK: 17.0000 g | PACK | Freq: Every day | ORAL | Status: DC | PRN

## 2022-11-26 MED ORDER — INSULIN ASPART 100 UNIT/ML IJ SOLN
0.0000 [IU] | Freq: Three times a day (TID) | INTRAMUSCULAR | Status: DC
  Administered 2022-11-26: 3 [IU] via SUBCUTANEOUS

## 2022-11-26 MED ORDER — MIDAZOLAM HCL 2 MG/2ML IJ SOLN
1.0000 mg | INTRAMUSCULAR | Status: AC
  Administered 2022-11-26: 2 mg via INTRAVENOUS
  Filled 2022-11-26: qty 2

## 2022-11-26 MED ORDER — METHOCARBAMOL 1000 MG/10ML IJ SOLN: 500.0000 mg | Freq: Four times a day (QID) | INTRAVENOUS | Status: DC | PRN

## 2022-11-26 MED ORDER — ONDANSETRON HCL 4 MG/2ML IJ SOLN
INTRAMUSCULAR | Status: DC | PRN
  Administered 2022-11-26: 4 mg via INTRAVENOUS

## 2022-11-26 MED ORDER — PROPOFOL 10 MG/ML IV BOLUS
INTRAVENOUS | Status: AC
  Filled 2022-11-26: qty 20

## 2022-11-26 MED ORDER — ZOLPIDEM TARTRATE 5 MG PO TABS
5.0000 mg | ORAL_TABLET | Freq: Every day | ORAL | Status: DC
  Administered 2022-11-26: 5 mg via ORAL
  Filled 2022-11-26: qty 1

## 2022-11-26 MED ORDER — OXYCODONE HCL 5 MG PO TABS
5.0000 mg | ORAL_TABLET | ORAL | Status: DC | PRN
  Administered 2022-11-26 (×2): 5 mg via ORAL
  Administered 2022-11-27 (×3): 10 mg via ORAL
  Filled 2022-11-26: qty 1
  Filled 2022-11-26 (×4): qty 2

## 2022-11-26 MED ORDER — FENTANYL CITRATE PF 50 MCG/ML IJ SOSY
25.0000 ug | PREFILLED_SYRINGE | INTRAMUSCULAR | Status: DC | PRN
  Administered 2022-11-26: 50 ug via INTRAVENOUS

## 2022-11-26 MED ORDER — PHENYLEPHRINE HCL-NACL 20-0.9 MG/250ML-% IV SOLN
INTRAVENOUS | Status: DC | PRN
  Administered 2022-11-26: 40 ug/min via INTRAVENOUS

## 2022-11-26 MED ORDER — PROPOFOL 10 MG/ML IV BOLUS
INTRAVENOUS | Status: DC | PRN
Start: 2022-11-26 — End: 2022-11-26
  Administered 2022-11-26: 200 mg via INTRAVENOUS

## 2022-11-26 MED ORDER — METOCLOPRAMIDE HCL 5 MG/ML IJ SOLN: 5.0000 mg | Freq: Three times a day (TID) | INTRAMUSCULAR | Status: DC | PRN

## 2022-11-26 MED ORDER — CHLORHEXIDINE GLUCONATE 0.12 % MT SOLN
15.0000 mL | Freq: Once | OROMUCOSAL | Status: AC
  Administered 2022-11-26: 15 mL via OROMUCOSAL

## 2022-11-26 MED ORDER — BUPIVACAINE-EPINEPHRINE (PF) 0.25% -1:200000 IJ SOLN
INTRAMUSCULAR | Status: DC | PRN
  Administered 2022-11-26: 20 mL

## 2022-11-26 MED ORDER — AMISULPRIDE (ANTIEMETIC) 5 MG/2ML IV SOLN: 10.0000 mg | Freq: Once | INTRAVENOUS | Status: DC | PRN

## 2022-11-26 MED ORDER — SUGAMMADEX SODIUM 200 MG/2ML IV SOLN
INTRAVENOUS | Status: DC | PRN
  Administered 2022-11-26: 280 mg via INTRAVENOUS

## 2022-11-26 MED ORDER — LIDOCAINE HCL (PF) 2 % IJ SOLN
INTRAMUSCULAR | Status: AC
  Filled 2022-11-26: qty 5

## 2022-11-26 MED ORDER — PANTOPRAZOLE SODIUM 40 MG PO TBEC
40.0000 mg | DELAYED_RELEASE_TABLET | Freq: Every day | ORAL | Status: DC
  Filled 2022-11-26: qty 1

## 2022-11-26 MED ORDER — SIMVASTATIN 20 MG PO TABS
10.0000 mg | ORAL_TABLET | Freq: Every evening | ORAL | Status: DC
  Administered 2022-11-26: 10 mg via ORAL
  Filled 2022-11-26: qty 1

## 2022-11-26 MED ORDER — TRANEXAMIC ACID-NACL 1000-0.7 MG/100ML-% IV SOLN
1000.0000 mg | Freq: Once | INTRAVENOUS | Status: AC
  Administered 2022-11-26: 1000 mg via INTRAVENOUS
  Filled 2022-11-26: qty 100

## 2022-11-26 MED ORDER — SUVOREXANT 5 MG PO TABS: 5.0000 | ORAL_TABLET | Freq: Every day | ORAL | Status: DC

## 2022-11-26 MED ORDER — MAGNESIUM OXIDE -MG SUPPLEMENT 400 (240 MG) MG PO TABS
400.0000 mg | ORAL_TABLET | Freq: Every day | ORAL | Status: DC
  Administered 2022-11-26 – 2022-11-27 (×2): 400 mg via ORAL
  Filled 2022-11-26 (×2): qty 1

## 2022-11-26 MED ORDER — DOFETILIDE 125 MCG PO CAPS
125.0000 ug | ORAL_CAPSULE | Freq: Two times a day (BID) | ORAL | Status: DC
  Administered 2022-11-26 – 2022-11-27 (×2): 125 ug via ORAL
  Filled 2022-11-26 (×2): qty 1

## 2022-11-26 MED ORDER — FENTANYL CITRATE (PF) 100 MCG/2ML IJ SOLN
INTRAMUSCULAR | Status: DC | PRN
  Administered 2022-11-26: 100 ug via INTRAVENOUS

## 2022-11-26 MED ORDER — HYDROMORPHONE HCL 1 MG/ML IJ SOLN: 0.5000 mg | INTRAMUSCULAR | Status: DC | PRN

## 2022-11-26 MED ORDER — THROMBIN (RECOMBINANT) 5000 UNITS EX SOLR
CUTANEOUS | Status: AC
  Filled 2022-11-26: qty 5000

## 2022-11-26 MED ORDER — CLINDAMYCIN HCL 300 MG PO CAPS: 600.0000 mg | ORAL_CAPSULE | ORAL | Status: DC

## 2022-11-26 MED ORDER — ORAL CARE MOUTH RINSE: 15.0000 mL | Freq: Once | OROMUCOSAL | Status: AC

## 2022-11-26 MED ORDER — METHOCARBAMOL 500 MG PO TABS: 500.0000 mg | ORAL_TABLET | Freq: Three times a day (TID) | ORAL | 1 refills | Status: DC | PRN

## 2022-11-26 MED ORDER — PHENOL 1.4 % MT LIQD: 1.0000 | OROMUCOSAL | Status: DC | PRN

## 2022-11-26 MED ORDER — OXYCODONE-ACETAMINOPHEN 5-325 MG PO TABS
1.0000 | ORAL_TABLET | ORAL | 0 refills | Status: DC | PRN
Start: 2022-11-26 — End: 2022-11-27

## 2022-11-26 MED ORDER — FERROUS SULFATE 325 (65 FE) MG PO TABS
325.0000 mg | ORAL_TABLET | Freq: Every evening | ORAL | Status: DC
  Administered 2022-11-26: 325 mg via ORAL
  Filled 2022-11-26: qty 1

## 2022-11-26 MED ORDER — FENTANYL CITRATE PF 50 MCG/ML IJ SOSY
PREFILLED_SYRINGE | INTRAMUSCULAR | Status: AC
  Administered 2022-11-26: 50 ug via INTRAVENOUS
  Filled 2022-11-26: qty 2

## 2022-11-26 MED ORDER — 0.9 % SODIUM CHLORIDE (POUR BTL) OPTIME
TOPICAL | Status: DC | PRN
  Administered 2022-11-26: 1000 mL

## 2022-11-26 MED ORDER — ONDANSETRON HCL 4 MG PO TABS: 4.0000 mg | ORAL_TABLET | Freq: Four times a day (QID) | ORAL | Status: DC | PRN

## 2022-11-26 MED ORDER — OXYCODONE HCL 5 MG/5ML PO SOLN: 5.0000 mg | Freq: Once | ORAL | Status: DC | PRN

## 2022-11-26 MED ORDER — THROMBIN 5000 UNITS EX SOLR: CUTANEOUS | Status: DC | PRN

## 2022-11-26 MED ORDER — TIRZEPATIDE 5 MG/0.5ML ~~LOC~~ SOAJ: 5.0000 mg | SUBCUTANEOUS | Status: DC

## 2022-11-26 MED ORDER — MENTHOL 3 MG MT LOZG: 1.0000 | LOZENGE | OROMUCOSAL | Status: DC | PRN

## 2022-11-26 MED ORDER — LORATADINE 10 MG PO TABS
10.0000 mg | ORAL_TABLET | Freq: Every day | ORAL | Status: DC
  Administered 2022-11-27: 10 mg via ORAL
  Filled 2022-11-26: qty 1

## 2022-11-26 MED ORDER — OXYCODONE HCL 5 MG PO TABS: 5.0000 mg | ORAL_TABLET | Freq: Once | ORAL | Status: DC | PRN

## 2022-11-26 MED ORDER — METOCLOPRAMIDE HCL 5 MG PO TABS: 5.0000 mg | ORAL_TABLET | Freq: Three times a day (TID) | ORAL | Status: DC | PRN

## 2022-11-26 MED ORDER — ACETAMINOPHEN 325 MG PO TABS: 325.0000 mg | ORAL_TABLET | Freq: Four times a day (QID) | ORAL | Status: DC | PRN

## 2022-11-26 MED ORDER — CEFAZOLIN SODIUM-DEXTROSE 2-4 GM/100ML-% IV SOLN
2.0000 g | Freq: Four times a day (QID) | INTRAVENOUS | Status: AC
  Administered 2022-11-26 – 2022-11-27 (×3): 2 g via INTRAVENOUS
  Filled 2022-11-26 (×3): qty 100

## 2022-11-26 MED ORDER — INSULIN ASPART 100 UNIT/ML IJ SOLN: 0.0000 [IU] | Freq: Every day | INTRAMUSCULAR | Status: DC

## 2022-11-26 MED ORDER — FENTANYL CITRATE (PF) 100 MCG/2ML IJ SOLN
INTRAMUSCULAR | Status: AC
  Filled 2022-11-26: qty 2

## 2022-11-26 MED ORDER — LACTATED RINGERS IV SOLN: INTRAVENOUS | Status: DC

## 2022-11-26 MED ORDER — ONDANSETRON HCL 4 MG PO TABS: 4.0000 mg | ORAL_TABLET | Freq: Three times a day (TID) | ORAL | 1 refills | Status: DC | PRN

## 2022-11-26 MED ORDER — METOLAZONE 5 MG PO TABS: 10.0000 mg | ORAL_TABLET | Freq: Every day | ORAL | Status: DC | PRN

## 2022-11-26 MED ORDER — ACETAMINOPHEN 10 MG/ML IV SOLN
INTRAVENOUS | Status: AC
  Filled 2022-11-26: qty 100

## 2022-11-26 MED ORDER — STERILE WATER FOR IRRIGATION IR SOLN
Status: DC | PRN
  Administered 2022-11-26: 2000 mL

## 2022-11-26 MED ORDER — METFORMIN HCL ER 500 MG PO TB24: 1000.0000 mg | ORAL_TABLET | Freq: Every day | ORAL | Status: DC

## 2022-11-26 MED ORDER — ACETAMINOPHEN 500 MG PO TABS: 1000.0000 mg | ORAL_TABLET | Freq: Four times a day (QID) | ORAL | Status: DC | PRN

## 2022-11-26 MED ORDER — FUROSEMIDE 40 MG PO TABS
40.0000 mg | ORAL_TABLET | Freq: Every day | ORAL | Status: DC
  Administered 2022-11-26 – 2022-11-27 (×2): 40 mg via ORAL
  Filled 2022-11-26 (×2): qty 1

## 2022-11-26 MED ORDER — SODIUM CHLORIDE 0.9 % IV SOLN: INTRAVENOUS | Status: DC

## 2022-11-26 MED ORDER — DEXAMETHASONE SODIUM PHOSPHATE 10 MG/ML IJ SOLN
INTRAMUSCULAR | Status: DC | PRN
  Administered 2022-11-26: 4 mg via INTRAVENOUS

## 2022-11-26 MED ORDER — LIDOCAINE 2% (20 MG/ML) 5 ML SYRINGE
INTRAMUSCULAR | Status: DC | PRN
  Administered 2022-11-26: 40 mg via INTRAVENOUS

## 2022-11-26 MED ORDER — CEFAZOLIN SODIUM-DEXTROSE 2-4 GM/100ML-% IV SOLN
2.0000 g | INTRAVENOUS | Status: AC
  Administered 2022-11-26: 2 g via INTRAVENOUS
  Filled 2022-11-26: qty 100

## 2022-11-26 MED ORDER — METOPROLOL SUCCINATE ER 50 MG PO TB24
50.0000 mg | ORAL_TABLET | Freq: Every evening | ORAL | Status: DC
  Administered 2022-11-26: 50 mg via ORAL
  Filled 2022-11-26: qty 1

## 2022-11-26 MED ORDER — POTASSIUM CHLORIDE CRYS ER 20 MEQ PO TBCR
20.0000 meq | EXTENDED_RELEASE_TABLET | Freq: Every day | ORAL | Status: DC
  Administered 2022-11-27: 20 meq via ORAL
  Filled 2022-11-26: qty 1

## 2022-11-26 MED ORDER — DOCUSATE SODIUM 100 MG PO CAPS
100.0000 mg | ORAL_CAPSULE | Freq: Two times a day (BID) | ORAL | Status: DC
  Administered 2022-11-26 – 2022-11-27 (×2): 100 mg via ORAL
  Filled 2022-11-26 (×2): qty 1

## 2022-11-26 MED ORDER — TRAMADOL HCL 50 MG PO TABS: 50.0000 mg | ORAL_TABLET | Freq: Four times a day (QID) | ORAL | Status: DC | PRN

## 2022-11-26 MED ORDER — BUPIVACAINE LIPOSOME 1.3 % IJ SUSP
INTRAMUSCULAR | Status: DC | PRN
  Administered 2022-11-26: 10 mL via PERINEURAL

## 2022-11-26 MED ORDER — ONDANSETRON HCL 4 MG/2ML IJ SOLN
INTRAMUSCULAR | Status: AC
  Filled 2022-11-26: qty 2

## 2022-11-26 MED ORDER — INSULIN ASPART 100 UNIT/ML IJ SOLN
4.0000 [IU] | Freq: Three times a day (TID) | INTRAMUSCULAR | Status: DC
  Administered 2022-11-26: 4 [IU] via SUBCUTANEOUS

## 2022-11-26 MED ORDER — MELATONIN 5 MG PO TABS
5.0000 mg | ORAL_TABLET | Freq: Every day | ORAL | Status: DC
  Administered 2022-11-26: 5 mg via ORAL
  Filled 2022-11-26: qty 1

## 2022-11-26 MED ORDER — TRANEXAMIC ACID-NACL 1000-0.7 MG/100ML-% IV SOLN
1000.0000 mg | INTRAVENOUS | Status: AC
  Administered 2022-11-26: 1000 mg via INTRAVENOUS
  Filled 2022-11-26: qty 100

## 2022-11-26 MED ORDER — PROMETHAZINE HCL 25 MG/ML IJ SOLN: 6.2500 mg | INTRAMUSCULAR | Status: DC | PRN

## 2022-11-26 MED ORDER — LOSARTAN POTASSIUM 25 MG PO TABS: 25.0000 mg | ORAL_TABLET | Freq: Every evening | ORAL | Status: DC

## 2022-11-26 MED ORDER — VITAMIN C 500 MG PO TABS
1000.0000 mg | ORAL_TABLET | Freq: Every day | ORAL | Status: DC
  Administered 2022-11-27: 1000 mg via ORAL
  Filled 2022-11-26: qty 2

## 2022-11-26 MED ORDER — BUPIVACAINE-EPINEPHRINE 0.25% -1:200000 IJ SOLN
INTRAMUSCULAR | Status: AC
  Filled 2022-11-26: qty 1

## 2022-11-26 MED ORDER — ACETAMINOPHEN 10 MG/ML IV SOLN
1000.0000 mg | Freq: Once | INTRAVENOUS | Status: DC | PRN
  Administered 2022-11-26: 1000 mg via INTRAVENOUS

## 2022-11-26 MED ORDER — ROCURONIUM BROMIDE 10 MG/ML (PF) SYRINGE
PREFILLED_SYRINGE | INTRAVENOUS | Status: DC | PRN
  Administered 2022-11-26: 30 mg via INTRAVENOUS

## 2022-11-26 MED ORDER — ROCURONIUM BROMIDE 10 MG/ML (PF) SYRINGE
PREFILLED_SYRINGE | INTRAVENOUS | Status: AC
  Filled 2022-11-26: qty 10

## 2022-11-26 MED ORDER — IODINE EX TINC: Freq: Every day | CUTANEOUS | Status: DC

## 2022-11-26 MED ORDER — FENTANYL CITRATE PF 50 MCG/ML IJ SOSY
25.0000 ug | PREFILLED_SYRINGE | INTRAMUSCULAR | Status: AC
  Administered 2022-11-26: 50 ug via INTRAVENOUS
  Filled 2022-11-26: qty 2

## 2022-11-26 MED ORDER — BISACODYL 10 MG RE SUPP: 10.0000 mg | Freq: Every day | RECTAL | Status: DC | PRN

## 2022-11-26 SURGICAL SUPPLY — 73 items
AID PSTN UNV HD RSTRNT DISP (MISCELLANEOUS) ×1
BAG COUNTER SPONGE SURGICOUNT (BAG) IMPLANT
BAG SPEC THK2 15X12 ZIP CLS (MISCELLANEOUS)
BAG SPNG CNTER NS LX DISP (BAG)
BAG ZIPLOCK 12X15 (MISCELLANEOUS) IMPLANT
BIT DRILL 1.6MX128 (BIT) ×1 IMPLANT
BIT DRILL 170X2.5X (BIT) IMPLANT
BIT DRL 170X2.5X (BIT) ×1
BLADE SAG 18X100X1.27 (BLADE) ×1 IMPLANT
CEMENT HV SMART SET (Cement) ×1 IMPLANT
COVER BACK TABLE 60X90IN (DRAPES) ×1 IMPLANT
COVER SURGICAL LIGHT HANDLE (MISCELLANEOUS) ×1 IMPLANT
CUP HUMERAL 42 PLUS 3 (Orthopedic Implant) IMPLANT
DRAPE INCISE IOBAN 66X45 STRL (DRAPES) ×1 IMPLANT
DRAPE ORTHO SPLIT 77X108 STRL (DRAPES) ×2
DRAPE SHEET LG 3/4 BI-LAMINATE (DRAPES) ×1 IMPLANT
DRAPE SURG ORHT 6 SPLT 77X108 (DRAPES) ×2 IMPLANT
DRAPE U-SHAPE 47X51 STRL (DRAPES) ×1 IMPLANT
DRILL 2.5 (BIT) ×1
DRSG ADAPTIC 3X8 NADH LF (GAUZE/BANDAGES/DRESSINGS) ×1 IMPLANT
DRSG CURAD 3X16 NADH (PACKING) IMPLANT
DURAPREP 26ML APPLICATOR (WOUND CARE) ×1 IMPLANT
ELECT BLADE TIP CTD 4 INCH (ELECTRODE) ×1 IMPLANT
ELECT NDL TIP 2.8 STRL (NEEDLE) ×1 IMPLANT
ELECT NEEDLE TIP 2.8 STRL (NEEDLE) ×1 IMPLANT
ELECT REM PT RETURN 15FT ADLT (MISCELLANEOUS) ×1 IMPLANT
EPIPHSYSI CENTER SZ 2 LT (Shoulder) ×1 IMPLANT
EPIPHYSIS CENTER SZ 2 LT (Shoulder) IMPLANT
FACESHIELD WRAPAROUND (MASK) ×2 IMPLANT
FACESHIELD WRAPAROUND OR TEAM (MASK) ×2 IMPLANT
GAUZE PAD ABD 8X10 STRL (GAUZE/BANDAGES/DRESSINGS) ×1 IMPLANT
GAUZE SPONGE 4X4 12PLY STRL (GAUZE/BANDAGES/DRESSINGS) ×1 IMPLANT
GLENOSPHERE XTEND RSA 42 SD +2 (Joint) IMPLANT
GLOVE BIOGEL PI IND STRL 7.5 (GLOVE) ×1 IMPLANT
GLOVE BIOGEL PI IND STRL 8.5 (GLOVE) ×1 IMPLANT
GLOVE ORTHO TXT STRL SZ7.5 (GLOVE) ×1 IMPLANT
GLOVE SURG ORTHO 8.5 STRL (GLOVE) ×1 IMPLANT
GOWN STRL REUS W/ TWL XL LVL3 (GOWN DISPOSABLE) ×2 IMPLANT
GOWN STRL REUS W/TWL XL LVL3 (GOWN DISPOSABLE) ×2
KIT BASIN OR (CUSTOM PROCEDURE TRAY) ×1 IMPLANT
KIT TURNOVER KIT A (KITS) IMPLANT
MANIFOLD NEPTUNE II (INSTRUMENTS) ×1 IMPLANT
METAGLENE DELTA EXTEND (Trauma) IMPLANT
METAGLENE DXTEND (Trauma) ×1 IMPLANT
NDL MAYO CATGUT SZ4 TPR NDL (NEEDLE) ×1 IMPLANT
NEEDLE MAYO CATGUT SZ4 (NEEDLE) ×1 IMPLANT
PACK SHOULDER (CUSTOM PROCEDURE TRAY) ×1 IMPLANT
PIN GUIDE 1.2 (PIN) IMPLANT
PIN GUIDE GLENOPHERE 1.5MX300M (PIN) IMPLANT
PIN METAGLENE 2.5 (PIN) IMPLANT
RESTRAINT HEAD UNIVERSAL NS (MISCELLANEOUS) ×1 IMPLANT
SCREW 4.5X24MM (Screw) ×1 IMPLANT
SCREW 4.5X36MM (Screw) IMPLANT
SCREW 48L (Screw) IMPLANT
SCREW BN 24X4.5XLCK STRL (Screw) IMPLANT
SLING ARM FOAM STRAP LRG (SOFTGOODS) ×1 IMPLANT
SMARTMIX MINI TOWER (MISCELLANEOUS) ×1
SPIKE FLUID TRANSFER (MISCELLANEOUS) ×1 IMPLANT
SPONGE SURGIFOAM ABS GEL 12-7 (HEMOSTASIS) ×1 IMPLANT
SPONGE T-LAP 4X18 ~~LOC~~+RFID (SPONGE) ×2 IMPLANT
STEM 12 HA (Stem) IMPLANT
STRIP CLOSURE SKIN 1/2X4 (GAUZE/BANDAGES/DRESSINGS) ×1 IMPLANT
SUCTION TUBE FRAZIER 12FR DISP (SUCTIONS) ×1 IMPLANT
SUT FIBERWIRE #2 38 T-5 BLUE (SUTURE) ×4
SUT MNCRL AB 4-0 PS2 18 (SUTURE) ×1 IMPLANT
SUT VIC AB 0 CT1 36 (SUTURE) ×1 IMPLANT
SUT VIC AB 0 CT2 27 (SUTURE) ×1 IMPLANT
SUT VIC AB 2-0 CT1 27 (SUTURE) ×1
SUT VIC AB 2-0 CT1 TAPERPNT 27 (SUTURE) ×1 IMPLANT
SUTURE FIBERWR #2 38 T-5 BLUE (SUTURE) ×4 IMPLANT
TAPE CLOTH SURG 4X10 WHT LF (GAUZE/BANDAGES/DRESSINGS) IMPLANT
TOWEL OR 17X26 10 PK STRL BLUE (TOWEL DISPOSABLE) ×1 IMPLANT
TOWER SMARTMIX MINI (MISCELLANEOUS) ×1 IMPLANT

## 2022-11-26 NOTE — Anesthesia Postprocedure Evaluation (Signed)
Anesthesia Post Note  Patient: Philip [name]  Procedure(s) Performed: REVERSE SHOULDER ARTHROPLASTY (Left: Shoulder)     Patient location during evaluation: PACU Anesthesia Type: Regional and General Level of consciousness: awake Pain management: pain level controlled Vital Signs Assessment: post-procedure vital signs reviewed and stable Respiratory status: spontaneous breathing, nonlabored ventilation and respiratory function stable Cardiovascular status: blood pressure returned to baseline and stable Postop Assessment: no apparent nausea or vomiting Anesthetic complications: no   No notable events documented.  Last Vitals:  Vitals:   11/26/22 1306 11/26/22 1522  BP: 113/73 108/72  Pulse: 62 67  Resp: 16 17  Temp:  (!) 36.4 C  SpO2: 99% 100%    Last Pain:  Vitals:   11/26/22 1522  TempSrc: Oral  PainSc:                  Philip [name]

## 2022-11-26 NOTE — Anesthesia Procedure Notes (Signed)
Anesthesia Regional Block: Interscalene brachial plexus block   Pre-Anesthetic Checklist: , timeout performed,  Correct Patient, Correct Site, Correct Laterality,  Correct Procedure, Correct Position, site marked,  Risks and benefits discussed,  Surgical consent,  Pre-op evaluation,  At surgeon's request and post-op pain management  Laterality: Left  Prep: chloraprep       Needles:  Injection technique: Single-shot  Needle Type: Echogenic Stimulator Needle     Needle Length: 9cm  Needle Gauge: 21     Additional Needles:   Procedures:,,,, ultrasound used (permanent image in chart),,    Narrative:  Start time: 11/26/2022 8:50 AM End time: 11/26/2022 9:00 AM Injection made incrementally with aspirations every 5 mL.  Performed by: Personally  Anesthesiologist: Leonides Grills, MD  Additional Notes: Functioning IV was confirmed and monitors were applied.  A timeout was performed. Sterile prep, hand hygiene and sterile gloves were used. A 90mm 21ga Arrow echogenic stimulator needle was used. Negative aspiration and negative test dose prior to incremental administration of local anesthetic. The patient tolerated the procedure well.  Ultrasound guidance: relevent anatomy identified, needle position confirmed, local anesthetic spread visualized around nerve(s), vascular puncture avoided.  Image printed for medical record.

## 2022-11-26 NOTE — Brief Op Note (Signed)
11/26/2022  11:25 AM  PATIENT:  Philip [name]  62 y.o. male  PRE-OPERATIVE DIAGNOSIS:  left shoulder osteoarthritis, end stage  POST-OPERATIVE DIAGNOSIS:  left shoulder osteoarthritis, end stage  PROCEDURE:  Procedure(s): REVERSE SHOULDER ARTHROPLASTY (Left) dePuy Delta Xtend with NO subscap repair  SURGEON:  Surgeons and Role:    Beverely Low, MD - Primary  PHYSICIAN ASSISTANT:   ASSISTANTS: Thea Gist, PA-C    ANESTHESIA:   regional and general  EBL:  200 mL   BLOOD ADMINISTERED:none  DRAINS: none   LOCAL MEDICATIONS USED:  MARCAINE     SPECIMEN:  No Specimen  DISPOSITION OF SPECIMEN:  N/A  COUNTS:  YES  TOURNIQUET:  * No tourniquets in log *  DICTATION: .Other Dictation: Dictation Number [id number]  PLAN OF CARE: Admit for overnight observation  PATIENT DISPOSITION:  PACU - hemodynamically stable.   Delay start of Pharmacological VTE agent (>24hrs) due to surgical blood loss or risk of bleeding: not applicable

## 2022-11-26 NOTE — Anesthesia Procedure Notes (Signed)
Procedure Name: Intubation Date/Time: 11/26/2022 9:33 AM  Performed by: Sindy Guadeloupe, CRNAPre-anesthesia Checklist: Patient identified, Emergency Drugs available, Suction available, Patient being monitored and Timeout performed Patient Re-evaluated:Patient Re-evaluated prior to induction Oxygen Delivery Method: Circle system utilized Preoxygenation: Pre-oxygenation with 100% oxygen Induction Type: IV induction Ventilation: Mask ventilation without difficulty Laryngoscope Size: Mac and 4 Grade View: Grade I Tube type: Oral Tube size: 7.5 mm Number of attempts: 1 Airway Equipment and Method: Stylet Placement Confirmation: ETT inserted through vocal cords under direct vision, positive ETCO2 and breath sounds checked- equal and bilateral Secured at: 23 cm Tube secured with: Tape Dental Injury: Teeth and Oropharynx as per pre-operative assessment

## 2022-11-26 NOTE — Op Note (Signed)
Philip [name], [place] MEDICAL RECORD NO: [id number] ACCOUNT NO: [id number] DATE OF BIRTH: 11-08-1960 FACILITY: [facility] LOCATION: [facility] PHYSICIAN: Almedia Balls. Ranell Patrick, MD  Operative Report   DATE OF PROCEDURE: 11/26/2022  PREOPERATIVE DIAGNOSIS:  Left shoulder end-stage arthritis.  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  PROCEDURE PERFORMED:  Left reverse shoulder replacement using DePuy Delta Xtend prosthesis with no subscap repair.  ATTENDING SURGEON:  Almedia Balls. Ranell Patrick, MD  ASSISTANT:  Konrad Felix Dixon, PA-C, who was scrubbed for the entire procedure, and necessary for satisfactory completion of surgery.  ANESTHESIA:  General anesthesia plus interscalene block was utilized.  ESTIMATED BLOOD LOSS:  200 mL  FLUID REPLACEMENT:  1500 mL crystalloid.  COUNTS:  Instrument counts correct.  COMPLICATIONS:  No complications.  ANTIBIOTICS:  Perioperative antibiotics were given.  INDICATIONS:  The patient is a 62 year old male who presents with a history of worsening left shoulder pain and dysfunction secondary to combination of arthritis as well as potential rotator cuff insufficiency.  The patient has had preoperative imaging  indicating bone-on-bone arthritis and rotator cuff tendons, which were intact.  We discussed options with the patient, recommending a shoulder arthroplasty to deal with his shoulder pain, planning on a primary anatomic as long as his subscapularis was in  good shape versus a reverse if it was not, informed consent obtained.  DESCRIPTION OF PROCEDURE:  After an adequate level of anesthesia was achieved, the patient was positioned in modified beach chair position.  Left shoulder correctly identified and sterile prep and drape performed.  Timeout called, verifying correct  patient, correct site, we entered the patient's shoulder using standard deltopectoral approach, starting at the coracoid process and extending down  the anterior humerus.  Dissection down through the subcutaneous tissues with Bovie.  Cephalic vein  identified and taken laterally with the deltoid, pectoralis taken medially.  Conjoined tendon identified and retracted medially.  Deep retractors placed.  We tenodesed the biceps in situ with 0 Vicryl figure-of-eight suture x3.  We incorporated part of  the pectoralis tendon with that repair.  We then went ahead and released the subscapularis.  It was noted that the subscap was extremely thin.  Once we had the tendon released, I was able to free up the muscle and palpate the muscle and the muscle  thickness was less than 0.5 cm thick, I could basically almost touch my thumb and my index finger together through the muscle, was felt that this muscle was likely not going to be able to stabilize the anatomic shoulder. The patient's humerus just with  slight elbow extension was fairly far anterior in the glenoid.  Because of that subscap insufficiency it was not in the normal position so we did go ahead at this point decided to go to the reverse shoulder replacement in order to give the patient fixed  focal mechanics and excellent pain control and good function postop.  We at this point externally rotated the humerus delivering the humerus out of the wound.  We entered the proximal humerus with a 6 mm reamer, reaming up to a size 12.  We then placed  our 12 mm T handle guide and resected the head at 20 degrees of retroversion with the oscillating saw.  Once we had that done, we removed excess osteophytes with a rongeur.  We then subluxed the humerus posteriorly getting good exposure of the glenoid  face.  There was no cartilage on the glenoid or the humeral head.  We removed  the glenoid labrum and the biceps anchor.  Next, we placed our deep retractors.  We then found our center point for a guide pin, which was low on the glenoid and orienting off  the scapular neck.  We drilled out bicortical.  We then reamed to  subchondral bone to where we had adequate baseplate preparation.  We did use the bow tie reamer peripherally and I also used a rongeur to remove some of the high side anteriorly as the  patient was retroverted, but we got the version corrected and good baseplate preparation.  We drilled the central peg hole out.  Impacted the HA coated press-fit baseplate into position.  We placed a 48 screw inferiorly, 36 superiorly at the base of  coracoid and 24 screw anteriorly.  We locked all 3 screws.  Baseplate was very secure, we selected a 42+2 standard glenosphere and attached that to the baseplate and secured that with a screwdriver.  I did a finger sweep to make sure we had no soft  tissue caught up between the baseplate and the glenosphere.  We irrigated thoroughly.  We then went to the humeral side, reamed for the 2 left metaphysis.  We then trialled with the 12 stem and the 2 left metaphysis set on the 0 setting and trialled in  20 degrees of retroversion.  Used a 42+3 poly trial on the humeral tray, reduced the shoulder.  Excellent soft tissue balancing and stability.  We removed the trial components, irrigated thoroughly, then used available bone graft from the humeral head  with impaction grafting technique.  We impacted the HA coated press fit 12 stem with the 2 left metaphysis and impacted in 20 degrees of retroversion. With the stem in place, we selected the real 42+3 poly placed on the humeral tray and impacted that.   We then reduced the shoulder, had nice little pop as it reduced.  Conjoined tendon appropriately tensioned and then no gapping with inferior pole or external rotation.  We irrigated thoroughly, resected the subscap remnant and then closed deltopectoral  interval with 0 Vicryl suture followed by 2-0 Vicryl for subcutaneous closure and 4-0 Monocryl for skin.  Steri-Strips applied followed by sterile dressing.  The patient tolerated surgery well.   PUS D: 11/26/2022 11:33:25 am T:  11/26/2022 11:52:00 am  JOB: [id number]/ [id number]

## 2022-11-26 NOTE — Discharge Instructions (Signed)
Ice to the shoulder constantly.  Keep the incision covered and clean and dry for one week, then ok to get it wet in the shower. Please leave the incision open to air after one week  Do exercise as instructed several times per day.  DO NOT reach behind your back or push up out of a chair with the operative arm.  Use a sling while you are up and around for comfort, may remove while seated.  Keep pillow propped behind the operative elbow.  Follow up with Dr Ranell Patrick in two weeks in the office, call [phone] for appt  Call Dr Ranell Patrick (cell) at [phone] with any questions or concerns

## 2022-11-26 NOTE — Interval H&P Note (Signed)
History and Physical Interval Note:  11/26/2022 8:37 AM  Philip [name]  has presented today for surgery, with the diagnosis of left shoulder osteoarthritis.  The various methods of treatment have been discussed with the patient and family. After consideration of risks, benefits and other options for treatment, the patient has consented to  Procedure(s): ANATOMIC TOTAL SHOULDER ARTHROPLASTY (Left) as a surgical intervention.  The patient's history has been reviewed, patient examined, no change in status, stable for surgery.  I have reviewed the patient's chart and labs.  Questions were answered to the patient's satisfaction.     [name]

## 2022-11-26 NOTE — Transfer of Care (Signed)
Immediate Anesthesia Transfer of Care Note  Patient: Philip [name]  Procedure(s) Performed: REVERSE SHOULDER ARTHROPLASTY (Left: Shoulder)  Patient Location: PACU  Anesthesia Type:General  Level of Consciousness: awake, alert , and responds to stimulation  Airway & Oxygen Therapy: Patient Spontanous Breathing and Patient connected to face mask oxygen  Post-op Assessment: Report given to RN and Post -op Vital signs reviewed and stable  Post vital signs: Reviewed and stable  Last Vitals:  Vitals Value Taken Time  BP 119/70 11/26/22 1118  Temp    Pulse 64 11/26/22 1120  Resp 13 11/26/22 1120  SpO2 100 % 11/26/22 1120  Vitals shown include unfiled device data.  Last Pain:  Vitals:   11/26/22 0904  TempSrc:   PainSc: 0-No pain         Complications: No notable events documented.

## 2022-11-27 DIAGNOSIS — M19012 Primary osteoarthritis, left shoulder: Secondary | ICD-10-CM | POA: Diagnosis not present

## 2022-11-27 LAB — GLUCOSE, CAPILLARY: Glucose-Capillary: 98 mg/dL (ref 70–99)

## 2022-11-27 MED ORDER — METHOCARBAMOL 500 MG PO TABS: 500.0000 mg | ORAL_TABLET | Freq: Three times a day (TID) | ORAL | 1 refills | Status: DC | PRN

## 2022-11-27 MED ORDER — ONDANSETRON HCL 4 MG PO TABS: 4.0000 mg | ORAL_TABLET | Freq: Three times a day (TID) | ORAL | 1 refills | Status: DC | PRN

## 2022-11-27 MED ORDER — OXYCODONE-ACETAMINOPHEN 5-325 MG PO TABS: 1.0000 | ORAL_TABLET | ORAL | 0 refills | Status: DC | PRN

## 2022-11-27 NOTE — Progress Notes (Signed)
Orthopedics Progress Note  Subjective: Patient with minimal pain this AM  Objective:  Vitals:   11/27/22 0145 11/27/22 0553  BP: 108/75 104/73  Pulse: 67 68  Resp: 18 18  Temp: 98.8 F (37.1 C) 98.9 F (37.2 C)  SpO2: 94% 97%    General: Awake and alert  Musculoskeletal: Left shoulder incision CDI, bandage changed Block still working  Lab Results  Component Value Date   WBC 5.8 11/15/2022   HGB 11.8 (L) 11/15/2022   HCT 38.2 (L) 11/15/2022   MCV 96.7 11/15/2022   PLT 159 11/15/2022       Component Value Date/Time   NA 138 11/15/2022 1039   NA 141 07/09/2021 1113   K 4.6 11/15/2022 1039   CL 105 11/15/2022 1039   CO2 26 11/15/2022 1039   GLUCOSE 110 (H) 11/15/2022 1039   BUN 18 11/15/2022 1039   BUN 34 (H) 07/09/2021 1113   CREATININE 1.11 11/15/2022 1039   CALCIUM 9.0 11/15/2022 1039   GFRNONAA >60 11/15/2022 1039   GFRAA >60 08/14/2019 0859    Lab Results  Component Value Date   INR 1.3 (H) 07/14/2017   INR 1.30 03/29/2017   INR 1.21 03/28/2017    Assessment/Plan: POD #1 s/p Procedure(s): REVERSE SHOULDER ARTHROPLASTY Stable overnight. Discharge to home after therapy Follow up in two weeks  Almedia Balls. Ranell Patrick, MD 11/27/2022 7:42 AM

## 2022-11-27 NOTE — Discharge Summary (Signed)
In most cases prophylactic antibiotics for Dental procdeures after total joint surgery are not necessary.  Exceptions are as follows:  1. History of prior total joint infection  2. Severely immunocompromised (Organ Transplant, cancer chemotherapy, Rheumatoid biologic meds such as Humera)  3. Poorly controlled diabetes (A1C &gt; 8.0, blood glucose over 200)  If you have one of these conditions, contact your surgeon for an antibiotic prescription, prior to your dental procedure. Orthopedic Discharge Summary        Physician Discharge Summary  Patient ID: Philip [name] MRN: [id number] DOB/AGE: 26-Oct-1960 62 y.o.  Admit date: 11/26/2022 Discharge date: 11/27/2022   Procedures:  Procedure(s) (LRB): REVERSE SHOULDER ARTHROPLASTY (Left)  Attending Physician:  Dr. 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 Patient tolerated the procedure well and brought to the recovery room in good condition and subsequently to the floor. Patient had an uncomplicated hospital course and was stable for discharge.   Disposition: Discharge  disposition: 01-Home or Self Care      with follow up in 2 weeks    Follow-up Information     Beverely Low, MD. Call in 2 week(s).   Specialty: Orthopedic Surgery Why: call [phone] for appt in two weeks Contact information: [address] [place] [address] [phone]                 Dental Antibiotics:  In most cases prophylactic antibiotics for Dental procdeures after total joint surgery are not necessary.  Exceptions are as follows:  1. History of prior total joint infection  2. Severely immunocompromised (Organ Transplant, cancer chemotherapy, Rheumatoid biologic meds such as Humera)  3. Poorly controlled diabetes (A1C &gt; 8.0, blood glucose over 200)  If you have one of these conditions, contact your surgeon for an antibiotic prescription, prior to your dental procedure.  Discharge Instructions     Call MD / Call 911   Complete by: As directed    If you experience chest pain or shortness of breath, CALL 911 and be transported to the hospital emergency room.  If you develope a fever above 101 F, pus (white drainage) or increased drainage or redness at the wound, or calf pain, call your surgeon's office.   Call MD / Call 911   Complete by: As directed    If you experience chest pain or shortness of breath, CALL 911 and  be transported to the hospital emergency room.  If you develope a fever above 101 F, pus (white drainage) or increased drainage or redness at the wound, or calf pain, call your surgeon's office.   Constipation Prevention   Complete by: As directed    Drink plenty of fluids.  Prune juice may be helpful.  You may use a stool softener, such as Colace (over the counter) 100 mg twice a day.  Use MiraLax (over the counter) for constipation as needed.   Constipation Prevention   Complete by: As directed    Drink plenty of fluids.  Prune juice may be helpful.  You may use a stool softener, such as Colace (over the counter)  100 mg twice a day.  Use MiraLax (over the counter) for constipation as needed.   Diet - low sodium heart healthy   Complete by: As directed    Diet - low sodium heart healthy   Complete by: As directed    Increase activity slowly as tolerated   Complete by: As directed    Increase activity slowly as tolerated   Complete by: As directed    Post-operative opioid taper instructions:   Complete by: As directed    POST-OPERATIVE OPIOID TAPER INSTRUCTIONS: It is important to wean off of your opioid medication as soon as possible. If you do not need pain medication after your surgery it is ok to stop day one. Opioids include: Codeine, Hydrocodone(Norco, Vicodin), Oxycodone(Percocet, oxycontin) and hydromorphone amongst others.  Long term and even short term use of opiods can cause: Increased pain response Dependence Constipation Depression Respiratory depression And more.  Withdrawal symptoms can include Flu like symptoms Nausea, vomiting And more Techniques to manage these symptoms Hydrate well Eat regular healthy meals Stay active Use relaxation techniques(deep breathing, meditating, yoga) Do Not substitute Alcohol to help with tapering If you have been on opioids for less than two weeks and do not have pain than it is ok to stop all together.  Plan to wean off of opioids This plan should start within one week post op of your joint replacement. Maintain the same interval or time between taking each dose and first decrease the dose.  Cut the total daily intake of opioids by one tablet each day Next start to increase the time between doses. The last dose that should be eliminated is the evening dose.      Post-operative opioid taper instructions:   Complete by: As directed    POST-OPERATIVE OPIOID TAPER INSTRUCTIONS: It is important to wean off of your opioid medication as soon as possible. If you do not need pain medication after your surgery it is ok to stop day one. Opioids  include: Codeine, Hydrocodone(Norco, Vicodin), Oxycodone(Percocet, oxycontin) and hydromorphone amongst others.  Long term and even short term use of opiods can cause: Increased pain response Dependence Constipation Depression Respiratory depression And more.  Withdrawal symptoms can include Flu like symptoms Nausea, vomiting And more Techniques to manage these symptoms Hydrate well Eat regular healthy meals Stay active Use relaxation techniques(deep breathing, meditating, yoga) Do Not substitute Alcohol to help with tapering If you have been on opioids for less than two weeks and do not have pain than it is ok to stop all together.  Plan to wean off of opioids This plan should start within one week post op of your joint replacement. Maintain the same interval or time between taking each dose and first decrease the dose.  Cut the total daily intake of  opioids by one tablet each day Next start to increase the time between doses. The last dose that should be eliminated is the evening dose.          Allergies as of 11/27/2022       Reactions   Penicillins Hives, Swelling, Other (See Comments)           Medication List     TAKE these medications    Accu-Chek FastClix Lancets Misc 2 (two) times daily. as directed   Accu-Chek Guide test strip Generic drug: glucose blood 2 (two) times daily.   acetaminophen 500 MG tablet Commonly known as: TYLENOL Take 1,000 mg by mouth every 6 (six) hours as needed for moderate pain.   Belsomra 5 MG Tabs Generic drug: Suvorexant Take 5 tablets by mouth at bedtime.   CALCIUM 600+D3 PO Take 1 tablet by mouth in the morning and at bedtime.   cetirizine 10 MG tablet Commonly known as: ZYRTEC Take 10 mg by mouth daily.   clindamycin 150 MG capsule Commonly known as: CLEOCIN Take 600 mg by mouth See admin instructions. Take 4 tablets by mouth 1 hour prior to seeing the dentist   dofetilide 125 MCG capsule Commonly known as:  TIKOSYN TAKE 1 CAPSULE BY MOUTH 2 TIMES A DAY   esomeprazole 40 MG capsule Commonly known as: NEXIUM Take 40 mg by mouth every evening.   ferrous sulfate 325 (65 FE) MG tablet Take 325 mg by mouth every evening.   furosemide 40 MG tablet Commonly known as: LASIX Take daily as needed for edema or shortness of breath What changed:  how much to take how to take this when to take this additional instructions   IODINE EX Apply 1 Application topically daily.   Klor-Con M20 20 MEQ tablet Generic drug: potassium chloride SA TAKE 1 TABLET DAILY, AND 1 EXTRA TABLET ON DAYS       TAKING  METOLAZONE.   losartan 25 MG tablet Commonly known as: COZAAR Take 25 mg by mouth every evening.   magnesium oxide 400 (240 Mg) MG tablet Commonly known as: MAG-OX Take 400 mg by mouth daily.   melatonin 5 MG Tabs Take 5 mg by mouth at bedtime.   metFORMIN 500 MG 24 hr tablet Commonly known as: GLUCOPHAGE-XR Take 1,000 mg by mouth daily.   methocarbamol 500 MG tablet Commonly known as: ROBAXIN Take 1 tablet (500 mg total) by mouth every 8 (eight) hours as needed for muscle spasms.   metolazone 10 MG tablet Commonly known as: ZAROXOLYN Take 10 mg by mouth daily as needed (swelling).   metoprolol succinate 50 MG 24 hr tablet Commonly known as: Toprol XL Take 1 tablet (50 mg total) by mouth daily.   Mounjaro 5 MG/0.5ML Pen Generic drug: tirzepatide Inject 5 mg into the skin every Sunday.   ondansetron 4 MG tablet Commonly known as: Zofran Take 1 tablet (4 mg total) by mouth every 8 (eight) hours as needed for nausea, vomiting or refractory nausea / vomiting.   oxyCODONE-acetaminophen 5-325 MG tablet Commonly known as: Percocet Take 1 tablet by mouth every 4 (four) hours as needed for severe pain.   simvastatin 10 MG tablet Commonly known as: ZOCOR Take 10 mg by mouth every evening.   traMADol 50 MG tablet Commonly known as: ULTRAM Take 50 mg by mouth every 6 (six) hours as  needed for moderate pain.   vitamin C 1000 MG tablet Take 1,000 mg by mouth in the morning.   Xarelto  20 MG Tabs tablet Generic drug: rivaroxaban TAKE 1 TABLET DAILY WITH   SUPPER          Signed: [name] 11/27/2022, 7:48 AM  [facility] is now [facility] [employer] [facility] [address], [place], [place] [address] Phone: [phone] Facebook  Instagram  [employer]

## 2022-11-27 NOTE — Evaluation (Addendum)
Occupational Therapy Evaluation Patient Details Name: Philip [name] MRN: [id number] DOB: 24-Jul-1960 Today's Date: 11/27/2022   History of Present Illness Pt is a 62 y.o. male complaining of left shoulder pain for multiple years, now s/p LT reverse total shoulder replacement under ROS on 11/26/2022.  Pertinent medical history of persistent A-fib, past obesity with gastric bypass, DM II, OA   Clinical Impression   Pt is a 62 year old male, s/p LT reverse shoulder replacement without functional use of non-dominant upper extremity secondary to effects of surgery and interscalene block and shoulder precautions. Therapist provided education and instruction to patient in regards to exercises, precautions, positioning, donning upper extremity clothing and bathing while maintaining shoulder precautions, ice and edema management and donning/doffing sling. Patient verbalized understanding and demonstrated as needed. Patient needed assistance to donn underwear, pants, socks and shoes and provided with instruction on compensatory strategies to perform ADLs. UE dressing not performed as pt still with PIV but pt verbalized understanding to technique for overhead and open front shirts and has handout to reinforce, Patient limited by decreased ROM in LT shoulder so therefore will need some form of assistance at home which will be provided 24/7 by pt's dad who is in good health. Patient  verbalized and/or demonstrated understanding to all instruction. Patient to follow up with MD for further therapy needs.         If plan is discharge home, recommend the following: A little help with bathing/dressing/bathroom;Assist for transportation;Assistance with cooking/housework    Functional Status Assessment  Patient has had a recent decline in their functional status and demonstrates the ability to make significant improvements in function in a reasonable and predictable amount of time.  Equipment Recommendations  None  recommended by OT    Recommendations for Other Services       Precautions / Restrictions        Mobility Bed Mobility Overal bed mobility: Modified Independent             General bed mobility comments: Pt verbalized and demonstrated understanding re: NWB and use of sling, to avoid use of LT elbow to leverage way in/out of bed. Pt has recliner at home if needed.    Transfers Overall transfer level: Independent Equipment used: None               General transfer comment: Sling in place      Balance Overall balance assessment: History of Falls, Modified Independent                                         ADL either performed or assessed with clinical judgement   ADL Overall ADL's : Needs assistance/impaired Eating/Feeding: Modified independent;Cueing for compensatory techinques;Sitting   Grooming: Wash/dry hands;Adhering to UE precautions;Cueing for compensatory techniques;Standing;Modified independent   Upper Body Bathing: Supervision/ safety;Standing;Adhering to UE precautions;Cueing for compensatory techniques;Cueing for sequencing   Lower Body Bathing: Supervison/ safety;Sit to/from stand;Cueing for safety;Cueing for sequencing;Cueing for compensatory techniques     Upper Body Dressing Details (indicate cue type and reason): Patient required about minimal assist to doff and don sling and was educated on positioning.  Patient did report that his dad will be able to assist.  Patient still on peripheral IV so unable to don shirt but was given instructions with worksheet to reinforce and verbalized understanding to doffing and donning both overhead and open front shirts while  following left shoulder precautions. Lower Body Dressing: Minimal assistance;Sitting/lateral leans;Sit to/from stand;Cueing for compensatory techniques Lower Body Dressing Details (indicate cue type and reason): Patient aware of one-handed techniques for things like socks and  shoes, will have a easy on shoes and assist from his father for difficult areas to reach such as just behind left shoulder when donning and doffing pants.  Patient knows not to use left upper extremity behind back. Toilet Transfer: Independent;Ambulation   Toileting- Clothing Manipulation and Hygiene: Modified independent Toileting - Clothing Manipulation Details (indicate cue type and reason): May need a little assistance for clothing management but otherwise independent.     Functional mobility during ADLs: Independent;Caregiver able to provide necessary level of assistance       Vision         Perception Perception: Within Functional Limits       Praxis Praxis: [facility]       Pertinent Vitals/Pain Pain Assessment Pain Assessment: No/denies pain Pain Score: 6  Pain Location: LT shoulder Pain Intervention(s): Limited activity within patient's tolerance, Monitored during session, Repositioned, Ice applied     Extremity/Trunk Assessment Upper Extremity Assessment LUE Sensation: decreased light touch (Sensation returning post-op)       Cervical / Trunk Assessment Cervical / Trunk Assessment: Normal   Communication     Cognition Arousal: Alert Behavior During Therapy: WFL for tasks assessed/performed Overall Cognitive Status: Within Functional Limits for tasks assessed                                       General Comments       Exercises Other Exercises Other Exercises: Pt able to perform all LUE post-op hand/wrist/forearm/elbow exercises correctly, demonstrating on RUE as needed due to residual LUE post-op numbness.   Shoulder Instructions Shoulder Instructions Donning/doffing shirt without moving shoulder: Minimal assistance;Patient able to independently direct caregiver Method for sponge bathing under operated UE: Supervision/safety;Patient able to independently direct caregiver Donning/doffing sling/immobilizer: Minimal assistance;Patient able to  independently direct caregiver Correct positioning of sling/immobilizer: Independent;Patient able to independently direct caregiver ROM for elbow, wrist and digits of operated UE: Independent;Patient able to independently direct caregiver Sling wearing schedule (on at all times/off for ADL's): Independent;Patient able to independently direct caregiver Proper positioning of operated UE when showering: Independent;Patient able to independently direct caregiver Dressing change: Maximal assistance;Patient able to independently direct caregiver Positioning of UE while sleeping: Independent;Patient able to independently direct caregiver;Set-up    Home Living                                          Prior Functioning/Environment                          OT Problem List: Decreased strength;Decreased range of motion;Impaired UE functional use;Pain      OT Treatment/Interventions:      OT Goals(Current goals can be found in the care plan section) Acute Rehab OT Goals OT Goal Formulation: All assessment and education complete, DC therapy Potential to Achieve Goals: Good ADL Goals Pt/caregiver will Perform Home Exercise Program: Left upper extremity;Increased ROM;With written HEP provided (hand/wr/forearm/elbow only) Additional ADL Goal #1: Pt will demonstrate UE/LE dressing, donning/doffing of sling, correct positioning of LT UE, and compensatory strategies for LT axilla hygiene, as well  as use of Ice packs, while correctly following all shoulder post-op precautions/restrictions.  OT Frequency:      Co-evaluation              AM-PAC OT "6 Clicks" Daily Activity     Outcome Measure Help from another person eating meals?: None Help from another person taking care of personal grooming?: None Help from another person toileting, which includes using toliet, bedpan, or urinal?: A Little Help from another person bathing (including washing, rinsing, drying)?: A  Little Help from another person to put on and taking off regular upper body clothing?: A Little Help from another person to put on and taking off regular lower body clothing?: A Little 6 Click Score: 20   End of Session Equipment Utilized During Treatment:  (sling) Nurse Communication: Other (comment) (OT completed and pt ready for d/c from OT persepctive)  Activity Tolerance: Patient tolerated treatment well Patient left: in chair;with call bell/phone within reach  OT Visit Diagnosis: History of falling (Z91.81);Muscle weakness (generalized) (M62.81);Pain Pain - Right/Left: Left Pain - part of body: Shoulder                Time: 9563-8756 OT Time Calculation (min): 31 min Charges:  OT General Charges $OT Visit: 1 Visit OT Evaluation $OT Eval Low Complexity: 1 Low OT Treatments $Self Care/Home Management : 8-22 mins  Victorino Dike, OT Acute Rehab Services Office: [phone] 11/27/2022   Theodoro Clock 11/27/2022, 2:40 PM

## 2022-11-29 ENCOUNTER — Encounter (HOSPITAL_COMMUNITY): Payer: Self-pay | Admitting: Orthopedic Surgery

## 2023-02-17 ENCOUNTER — Encounter (HOSPITAL_COMMUNITY): Payer: Self-pay | Admitting: Physician Assistant

## 2023-02-17 ENCOUNTER — Ambulatory Visit (HOSPITAL_COMMUNITY)
Admission: RE | Admit: 2023-02-17 | Discharge: 2023-02-17 | Disposition: A | Discharge status: Home / Self Care | Payer: 59 | Source: Ambulatory Visit | Attending: Physician Assistant | Admitting: Physician Assistant

## 2023-02-17 VITALS — BP 130/80 | HR 72 | Ht 76.0 in | Wt 319.6 lb

## 2023-02-17 DIAGNOSIS — I5032 Chronic diastolic (congestive) heart failure: Secondary | ICD-10-CM | POA: Diagnosis not present

## 2023-02-17 DIAGNOSIS — Z5181 Encounter for therapeutic drug level monitoring: Secondary | ICD-10-CM | POA: Insufficient documentation

## 2023-02-17 DIAGNOSIS — D6869 Other thrombophilia: Secondary | ICD-10-CM | POA: Diagnosis not present

## 2023-02-17 DIAGNOSIS — E669 Obesity, unspecified: Secondary | ICD-10-CM | POA: Insufficient documentation

## 2023-02-17 DIAGNOSIS — I110 Hypertensive heart disease with heart failure: Secondary | ICD-10-CM | POA: Diagnosis not present

## 2023-02-17 DIAGNOSIS — G4733 Obstructive sleep apnea (adult) (pediatric): Secondary | ICD-10-CM | POA: Diagnosis not present

## 2023-02-17 DIAGNOSIS — Z79899 Other long term (current) drug therapy: Secondary | ICD-10-CM | POA: Diagnosis not present

## 2023-02-17 DIAGNOSIS — I4819 Other persistent atrial fibrillation: Secondary | ICD-10-CM | POA: Diagnosis not present

## 2023-02-17 DIAGNOSIS — Z6838 Body mass index (BMI) 38.0-38.9, adult: Secondary | ICD-10-CM | POA: Insufficient documentation

## 2023-02-17 DIAGNOSIS — Z952 Presence of prosthetic heart valve: Secondary | ICD-10-CM | POA: Diagnosis not present

## 2023-02-17 DIAGNOSIS — Z7901 Long term (current) use of anticoagulants: Secondary | ICD-10-CM | POA: Insufficient documentation

## 2023-02-17 DIAGNOSIS — R9431 Abnormal electrocardiogram [ECG] [EKG]: Secondary | ICD-10-CM | POA: Diagnosis not present

## 2023-02-17 LAB — BASIC METABOLIC PANEL
Anion gap: 10 (ref 5–15)
BUN: 23 mg/dL (ref 8–23)
CO2: 22 mmol/L (ref 22–32)
Calcium: 8.8 mg/dL — ABNORMAL LOW (ref 8.9–10.3)
Chloride: 107 mmol/L (ref 98–111)
Creatinine, Ser: 1.34 mg/dL — ABNORMAL HIGH (ref 0.61–1.24)
GFR, Estimated: >60 mL/min (ref >60)
Glucose, Bld: 126 mg/dL — ABNORMAL HIGH (ref 70–99)
Potassium: 3.7 mmol/L (ref 3.5–5.1)
Sodium: 139 mmol/L (ref 135–145)

## 2023-02-17 LAB — MAGNESIUM: Magnesium: 2.3 mg/dL (ref 1.7–2.4)

## 2023-02-17 NOTE — Progress Notes (Signed)
Primary Care Physician: Loni Beckwith, MD Referring Physician: Dr. Mable Paris LEVINE JAROSH is a 62 y.o. 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 Pt had been staying in SR for a long period of time on Tikosyn 250 mcg bid and talked [name] into stopping drug  in June since he was doing so well. Unfortunately, he went back into afib in September.He was in [place] at the time. He was cardioverted in the local ER but AF recurred about 12 hours later. He saw [name] 10/2 and decided to reload tikosyn. He was on 250 mcg in the past for borderline qt. Qt today is 499 in afib. No benadryl use and no missed anticoagulation. Pt is not taking any contraindicated drugs, as reviewed by PharmD. Patient is s/p dofetilide reloading 10/10-10/13/23 with DCCV on 01/28/22.   On follow up today, patient reports that he has done well since his last visit. He remains in SR. He periodically takes a metolazone, about once every two months. No bleeding issues on anticoagulation.   Today, he denies symptoms of palpitations, chest pain, shortness of breath, orthopnea, PND, dizziness, presyncope, syncope, or neurologic sequela. The patient is tolerating medications without difficulties and is otherwise without complaint today.   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 as directed     ACCU-CHEK GUIDE test strip 2 (two) times daily.     acetaminophen (TYLENOL) 500 MG tablet Take 1,000 mg by mouth every 6 (six) hours as needed for moderate pain.     Ascorbic Acid (VITAMIN C) 1000 MG tablet Take 1,000 mg by mouth in the morning.     BELSOMRA 5 MG TABS Take 5 tablets by mouth at bedtime.     Calcium Carb-Cholecalciferol (CALCIUM 600+D3 PO) Take 1 tablet by mouth in the morning and at bedtime.     cetirizine (ZYRTEC) 10 MG tablet Take 10 mg by mouth daily.      clindamycin (CLEOCIN) 150 MG capsule Take 600 mg by mouth See admin instructions. Take 4 tablets by mouth 1 hour prior to seeing the dentist     dofetilide (TIKOSYN) 125 MCG capsule TAKE 1 CAPSULE BY MOUTH 2 TIMES A DAY 60 capsule 3   esomeprazole (NEXIUM) 40 MG capsule Take 40 mg by mouth every evening.      ferrous sulfate 325 (65 FE) MG tablet Take  325 mg by mouth every evening.     furosemide (LASIX) 40 MG tablet Take daily as needed for edema or shortness of breath (Patient taking differently: Take 40 mg by mouth daily.) 30 tablet 6   IODINE EX Apply 1 Application topically daily.     JARDIANCE 10 MG TABS tablet Take 10 mg by mouth every morning.     KLOR-CON M20 20 MEQ tablet TAKE 1 TABLET DAILY, AND 1 EXTRA TABLET ON DAYS       TAKING  METOLAZONE. 95 tablet 2   losartan (COZAAR) 25 MG tablet Take 25 mg by mouth every evening.      magnesium oxide (MAG-OX) 400 (240 Mg) MG tablet Take 400 mg by mouth daily.     melatonin 5 MG TABS Take 5 mg by mouth at bedtime.     metFORMIN (GLUCOPHAGE-XR) 500 MG 24 hr tablet Take 1,000 mg by mouth daily.     metolazone (ZAROXOLYN) 10 MG tablet Take 10 mg by mouth daily as needed (swelling).     metoprolol  succinate (TOPROL XL) 50 MG 24 hr tablet Take 1 tablet (50 mg total) by mouth daily. 90 tablet 2   simvastatin (ZOCOR) 10 MG tablet Take 10 mg by mouth every evening.      tirzepatide (MOUNJARO) 7.5 MG/0.5ML Pen Inject 7.5 mg into the skin once a week.     XARELTO 20 MG TABS tablet TAKE 1 TABLET DAILY WITH   SUPPER 90 tablet 3   No current facility-administered medications for this encounter.    ROS- All systems are reviewed and negative except as per the HPI above  Physical Exam: Vitals:   02/17/23 1351  BP: 130/80  Pulse: 72  Weight: (!) 145 kg  Height: 6\' 4"  (1.93 m)    Wt Readings from Last 3 Encounters:  02/17/23 (!) 145 kg  11/26/22 133.8 kg  11/15/22 134.3 kg    GEN: Well nourished, well developed in no acute distress NECK: No JVD; No carotid bruits CARDIAC: Regular rate and rhythm, no murmurs, rubs, gallops RESPIRATORY:  Clear to auscultation without rales, wheezing or rhonchi  ABDOMEN: Soft, non-tender, non-distended EXTREMITIES:  No edema; No deformity    EKG today demonstrates SR Vent. rate 72 BPM PR interval 104 ms QRS duration 112 ms QT/QTcB 442/483 ms    CHA2DS2-VASc Score = 3  The patient's score is based upon: CHF History: 1 HTN History: 1 Diabetes History: 1 Stroke History: 0 Vascular Disease History: 0 Age Score: 0 Gender Score: 0       ASSESSMENT AND PLAN: Persistent Atrial Fibrillation (ICD10:  I48.19) The patient's CHA2DS2-VASc score is 3, indicating a 3.2% annual risk of stroke.   S/p dofetilide reload 10/10-10/13/23  Patient appears to be maintaining SR Continue dofetilide 125 mcg BID, QT stable Check bmet/mag today Continue Toprol 50 mg daily Continue Xarelto 20 mg daily  Secondary Hypercoagulable State (ICD10:  D68.69) The patient is at significant risk for stroke/thromboembolism based upon his CHA2DS2-VASc Score of 3.  Continue Rivaroxaban (Xarelto).   HTN Stable on current regimen  Obesity Body mass index is 38.9 kg/m.   Encouraged lifestyle modification S/p gastric bypass  S/p bioprosthetic AVR Followed by Dr Antoine Poche.  OSA  Encouraged nightly CPAP  Chronic diastolic dysfunction Patient reports some weight gain, he has taken a metolazone and has follow up with his primary cardiologist in [facility] next week.   Follow up in the AF clinic in 6 months.    Jorja Loa PA-C  [facility] [address] [place], [place] [address] [phone]

## 2023-02-18 ENCOUNTER — Other Ambulatory Visit (HOSPITAL_COMMUNITY): Payer: Self-pay | Admitting: *Deleted

## 2023-02-18 DIAGNOSIS — I4819 Other persistent atrial fibrillation: Secondary | ICD-10-CM

## 2023-02-18 MED ORDER — POTASSIUM CHLORIDE CRYS ER 20 MEQ PO TBCR: 20.0000 meq | EXTENDED_RELEASE_TABLET | Freq: Two times a day (BID) | ORAL | 2 refills | Status: AC

## 2023-02-21 ENCOUNTER — Other Ambulatory Visit (HOSPITAL_COMMUNITY): Payer: Self-pay | Admitting: Physician Assistant

## 2023-03-21 ENCOUNTER — Ambulatory Visit (HOSPITAL_COMMUNITY): Payer: 59 | Admitting: Physician Assistant

## 2023-05-08 ENCOUNTER — Other Ambulatory Visit (HOSPITAL_COMMUNITY): Payer: Self-pay | Admitting: Physician Assistant

## 2023-08-18 ENCOUNTER — Ambulatory Visit (HOSPITAL_COMMUNITY): Payer: 59 | Admitting: Physician Assistant

## 2024-02-03 ENCOUNTER — Other Ambulatory Visit (HOSPITAL_COMMUNITY): Payer: Self-pay | Admitting: Internal Medicine

## 2024-02-03 ENCOUNTER — Encounter (HOSPITAL_COMMUNITY): Payer: Self-pay
